# Patient Record
Sex: Male | Born: 1950 | Race: Black or African American | Hispanic: No | Marital: Single | Smoking: Former smoker
Health system: Southern US, Community
[De-identification: ages and names within clinical notes are randomized; demographics above are authoritative.]

## PROBLEM LIST (undated history)

## (undated) DIAGNOSIS — N471 Phimosis: Secondary | ICD-10-CM

## (undated) DIAGNOSIS — R972 Elevated prostate specific antigen [PSA]: Secondary | ICD-10-CM

## (undated) DIAGNOSIS — F79 Unspecified intellectual disabilities: Secondary | ICD-10-CM

## (undated) DIAGNOSIS — I10 Essential (primary) hypertension: Secondary | ICD-10-CM

## (undated) DIAGNOSIS — K219 Gastro-esophageal reflux disease without esophagitis: Secondary | ICD-10-CM

## (undated) DIAGNOSIS — D649 Anemia, unspecified: Secondary | ICD-10-CM

## (undated) DIAGNOSIS — R600 Localized edema: Secondary | ICD-10-CM

## (undated) DIAGNOSIS — F99 Mental disorder, not otherwise specified: Secondary | ICD-10-CM

## (undated) HISTORY — DX: Localized edema: R60.0

## (undated) HISTORY — DX: Mental disorder, not otherwise specified: F99

## (undated) HISTORY — DX: Essential (primary) hypertension: I10

## (undated) HISTORY — DX: Elevated prostate specific antigen (PSA): R97.20

## (undated) HISTORY — DX: Unspecified intellectual disabilities: F79

## (undated) HISTORY — DX: Phimosis: N47.1

---

## 2014-08-20 DIAGNOSIS — R600 Localized edema: Secondary | ICD-10-CM | POA: Insufficient documentation

## 2014-08-20 DIAGNOSIS — F489 Nonpsychotic mental disorder, unspecified: Secondary | ICD-10-CM

## 2014-08-20 DIAGNOSIS — F99 Mental disorder, not otherwise specified: Secondary | ICD-10-CM | POA: Insufficient documentation

## 2014-08-20 DIAGNOSIS — F79 Unspecified intellectual disabilities: Secondary | ICD-10-CM | POA: Insufficient documentation

## 2014-08-20 DIAGNOSIS — I10 Essential (primary) hypertension: Secondary | ICD-10-CM | POA: Insufficient documentation

## 2014-10-15 ENCOUNTER — Ambulatory Visit: Payer: Self-pay | Admitting: Urology

## 2015-06-09 ENCOUNTER — Encounter: Payer: Self-pay | Admitting: Urology

## 2015-06-09 ENCOUNTER — Ambulatory Visit: Payer: Medicare Other | Admitting: Urology

## 2015-06-09 VITALS — BP 182/68 | HR 68 | Ht 71.0 in | Wt 204.0 lb

## 2015-06-09 DIAGNOSIS — R972 Elevated prostate specific antigen [PSA]: Secondary | ICD-10-CM

## 2015-06-10 ENCOUNTER — Encounter: Payer: Self-pay | Admitting: Urology

## 2015-06-10 NOTE — Progress Notes (Signed)
06/09/2015 4:29 PM   Samuel Shelton [birth year] [id number]  Referring provider: No referring provider defined for this encounter.  Chief Complaint  Patient presents with  . Elevated PSA    pt last PSA 05/21/15 7.4ng/mL    HPI: 64 yo M initially presented for evaluation of an elevated PSA to 5.36 ng/dL. He also had a 4K study which indicated 43% risk of high grade malignancy if DRE is positive or 26% if DRE is negative. Unfortunately, Samuel Shelton is mentally handicapped and unable to tolerate a DRE in the office. He was originally scheduled for prostate biopsy in the OR over 6 months ago, however, due to the patient's social situation and change of healthcare proxy, this procedure has been held up until the paperwork has been finalized.  In the interim, his most recent PSA has risen to 7.4 ng/dL on 05/20/15.  He and his health care proxy, Samuel Shelton, counlesed to proceed with DRE/ prostate biopsy in the OR.      PMH: Past Medical History  Diagnosis Date  . Chronic mental illness   . Edema of both legs   . Elevated PSA   . Mental retardation   . Phimosis   . Hypertension   . Elevated PSA     Surgical History: History reviewed. No pertinent past surgical history.  Home Medications:    Medication List       This list is accurate as of: 06/09/15 11:59 PM.  Always use your most recent med list.               hydrochlorothiazide 12.5 MG tablet  Commonly known as:  HYDRODIURIL  Take by mouth.        Allergies: No Known Allergies  Family History: Family History  Problem Relation Age of Onset  . Stroke Mother   . Hypertension Sister     Social History:  reports that he has quit smoking. He does not have any smokeless tobacco history on file. He reports that he does not drink alcohol or use illicit drugs.   Physical Exam: BP 182/68 mmHg  Pulse 68  Ht 5\' 11"  (1.803 m)  Wt 204 lb (92.534 kg)  BMI 28.46 kg/m2  Constitutional:  Alert and oriented, No  acute distress.  HEENT: [place] AT, moist mucus membranes.  Trachea midline, no masses. Cardiovascular: No clubbing, cyanosis, or edema. RRR. Respiratory: Normal respiratory effort, no increased work of breathing.  CTAB. GI: Abdomen is soft, nontender, nondistended, no abdominal masses GU: No CVA tenderness.  Skin: No rashes, bruises or suspicious lesions. Neurologic: Grossly intact, no focal deficits, moving all 4 extremities. Psychiatric: sight somewhat limited, answer some questions appropriately but no others. Somewhat tangential.   Laboratory Data: PSA 7.4 ng/dL 05/20/15  Assessment & Plan:  [name] with chronic mental illness noted to have a rising PSA most recently up to 7.4 ng/dL. He is unable to tolerate a rectal exam in the office. We've previously recommended prostate biopsy in the OR, however, due to social situation with his healthcare power of attorney, this has been delayed. This paperwork has been finalized and we are ready to move forward with prostate biopsy, rectal exam under anesthesia. The risks rocedure were discussed today in detail.  We discussed prostate biopsy in detail including the procedure itself, the risks of blood in the urine, stool, and ejaculate, serious infection, and discomfort. He is willing to proceed with this as discussed.    1. Elevated PSA  2. Rising PSA  level   Return in about 2 weeks (around 06/23/2015) for schedule prostate biopsy in OR.  Hollice Espy, MD  [facility] [address], [place], [place] [address] [phone]

## 2015-06-16 ENCOUNTER — Encounter
Admission: RE | Admit: 2015-06-16 | Discharge: 2015-06-16 | Disposition: A | Discharge status: Home / Self Care | Payer: Medicare Other | Source: Ambulatory Visit | Attending: Urology | Admitting: Urology

## 2015-06-16 DIAGNOSIS — Z01812 Encounter for preprocedural laboratory examination: Secondary | ICD-10-CM | POA: Insufficient documentation

## 2015-06-16 DIAGNOSIS — F489 Nonpsychotic mental disorder, unspecified: Secondary | ICD-10-CM | POA: Insufficient documentation

## 2015-06-16 DIAGNOSIS — F79 Unspecified intellectual disabilities: Secondary | ICD-10-CM | POA: Diagnosis not present

## 2015-06-16 DIAGNOSIS — Z01810 Encounter for preprocedural cardiovascular examination: Secondary | ICD-10-CM | POA: Insufficient documentation

## 2015-06-16 DIAGNOSIS — R972 Elevated prostate specific antigen [PSA]: Secondary | ICD-10-CM | POA: Diagnosis present

## 2015-06-16 DIAGNOSIS — I10 Essential (primary) hypertension: Secondary | ICD-10-CM | POA: Insufficient documentation

## 2015-06-16 DIAGNOSIS — R600 Localized edema: Secondary | ICD-10-CM | POA: Insufficient documentation

## 2015-06-16 LAB — POTASSIUM: Potassium: 4.2 mmol/L (ref 3.5–5.1)

## 2015-06-16 NOTE — Patient Instructions (Signed)
  Your procedure is scheduled on: June 27,2016 (Monday) Report to Day Surgery. To find out your arrival time please call [phone] between 1PM - 3PM on June 19, 2015 (Friday).  Remember: Instructions that are not followed completely may result in serious medical risk, up to and including death, or upon the discretion of your surgeon and anesthesiologist your surgery may need to be rescheduled.    __x__ 1. Do not eat food or drink liquids after midnight. No gum chewing or hard candies.     ____ 2. No Alcohol for 24 hours before or after surgery.   ____ 3. Bring all medications with you on the day of surgery if instructed.    __x__ 4. Notify your doctor if there is any change in your medical condition     (cold, fever, infections).     Do not wear jewelry, make-up, hairpins, clips or nail polish.  Do not wear lotions, powders, or perfumes. You may wear deodorant.  Do not shave 48 hours prior to surgery. Men may shave face and neck.  Do not bring valuables to the hospital.    [facility] is not responsible for any belongings or valuables.               Contacts, dentures or bridgework may not be worn into surgery.  Leave your suitcase in the car. After surgery it may be brought to your room.  For patients admitted to the hospital, discharge time is determined by your                treatment team.   Patients discharged the day of surgery will not be allowed to drive home.   Please read over the following fact sheets that you were given:   Surgical Site Infection Prevention   ____ Take these medicines the morning of surgery with A SIP OF WATER:    1.   2.   3.   4.  5.  6.  ____ Fleet Enema (as directed)   ____ Use CHG Soap as directed  ____ Use inhalers on the day of surgery  ____ Stop metformin 2 days prior to surgery    ____ Take 1/2 of usual insulin dose the night before surgery and none on the morning of surgery.   ____ Stop Coumadin/Plavix/aspirin on   ____  Stop Anti-inflammatories on (No Motrin,Ibuprofen Aleve Advil etc. Tylenol ok to take if needed for pain/ headache)    ____ Stop supplements until after surgery.    ____ Bring C-Pap to the hospital.

## 2015-06-16 NOTE — Pre-Procedure Instructions (Signed)
EKG to anesthesia for review. 

## 2015-06-22 ENCOUNTER — Encounter: Payer: Self-pay | Admitting: *Deleted

## 2015-06-22 ENCOUNTER — Ambulatory Visit: Payer: Medicare Other | Admitting: Anesthesiology

## 2015-06-22 ENCOUNTER — Encounter
Admission: RE | Disposition: A | Discharge status: Home / Self Care | Payer: Self-pay | Source: Ambulatory Visit | Attending: Urology

## 2015-06-22 ENCOUNTER — Ambulatory Visit
Admission: RE | Admit: 2015-06-22 | Discharge: 2015-06-22 | Disposition: A | Discharge status: Home / Self Care | Payer: Medicare Other | Source: Ambulatory Visit | Attending: Urology | Admitting: Urology

## 2015-06-22 DIAGNOSIS — Z8249 Family history of ischemic heart disease and other diseases of the circulatory system: Secondary | ICD-10-CM | POA: Diagnosis not present

## 2015-06-22 DIAGNOSIS — N471 Phimosis: Secondary | ICD-10-CM | POA: Insufficient documentation

## 2015-06-22 DIAGNOSIS — I10 Essential (primary) hypertension: Secondary | ICD-10-CM | POA: Insufficient documentation

## 2015-06-22 DIAGNOSIS — C61 Malignant neoplasm of prostate: Secondary | ICD-10-CM | POA: Diagnosis not present

## 2015-06-22 DIAGNOSIS — Z79899 Other long term (current) drug therapy: Secondary | ICD-10-CM | POA: Insufficient documentation

## 2015-06-22 DIAGNOSIS — F79 Unspecified intellectual disabilities: Secondary | ICD-10-CM | POA: Insufficient documentation

## 2015-06-22 DIAGNOSIS — R600 Localized edema: Secondary | ICD-10-CM | POA: Insufficient documentation

## 2015-06-22 DIAGNOSIS — R972 Elevated prostate specific antigen [PSA]: Secondary | ICD-10-CM | POA: Insufficient documentation

## 2015-06-22 DIAGNOSIS — Z823 Family history of stroke: Secondary | ICD-10-CM | POA: Insufficient documentation

## 2015-06-22 DIAGNOSIS — Z87891 Personal history of nicotine dependence: Secondary | ICD-10-CM | POA: Insufficient documentation

## 2015-06-22 HISTORY — PX: PROSTATE BIOPSY: SHX241

## 2015-06-22 SURGERY — BIOPSY, PROSTATE
Anesthesia: General | Wound class: Clean Contaminated

## 2015-06-22 MED ORDER — LEVOFLOXACIN IN D5W 500 MG/100ML IV SOLN
INTRAVENOUS | Status: AC
  Administered 2015-06-22: 500 mg via INTRAVENOUS
  Filled 2015-06-22: qty 100

## 2015-06-22 MED ORDER — BUPIVACAINE HCL (PF) 0.5 % IJ SOLN
INTRAMUSCULAR | Status: AC
  Filled 2015-06-22: qty 30

## 2015-06-22 MED ORDER — FAMOTIDINE 20 MG PO TABS
ORAL_TABLET | ORAL | Status: AC
  Administered 2015-06-22: 20 mg via ORAL
  Filled 2015-06-22: qty 1

## 2015-06-22 MED ORDER — FAMOTIDINE 20 MG PO TABS
20.0000 mg | ORAL_TABLET | Freq: Once | ORAL | Status: AC
  Administered 2015-06-22: 20 mg via ORAL

## 2015-06-22 MED ORDER — PROPOFOL 10 MG/ML IV BOLUS
INTRAVENOUS | Status: DC | PRN
  Administered 2015-06-22: 30 mg via INTRAVENOUS
  Administered 2015-06-22: 130 mg via INTRAVENOUS

## 2015-06-22 MED ORDER — LIDOCAINE HCL 1 % IJ SOLN
INTRAMUSCULAR | Status: DC | PRN
  Administered 2015-06-22: 10 mL

## 2015-06-22 MED ORDER — FENTANYL CITRATE (PF) 100 MCG/2ML IJ SOLN
INTRAMUSCULAR | Status: DC | PRN
  Administered 2015-06-22: 50 ug via INTRAVENOUS

## 2015-06-22 MED ORDER — LIDOCAINE HCL 2 % EX GEL
CUTANEOUS | Status: AC
  Filled 2015-06-22: qty 10

## 2015-06-22 MED ORDER — EPHEDRINE SULFATE 50 MG/ML IJ SOLN
INTRAMUSCULAR | Status: DC | PRN
  Administered 2015-06-22: 10 mg via INTRAVENOUS

## 2015-06-22 MED ORDER — LEVOFLOXACIN IN D5W 500 MG/100ML IV SOLN
500.0000 mg | INTRAVENOUS | Status: DC
  Administered 2015-06-22: 500 mg via INTRAVENOUS

## 2015-06-22 MED ORDER — LACTATED RINGERS IV SOLN
INTRAVENOUS | Status: DC
  Administered 2015-06-22 (×2): via INTRAVENOUS

## 2015-06-22 MED ORDER — ONDANSETRON HCL 4 MG/2ML IJ SOLN: 4.0000 mg | Freq: Once | INTRAMUSCULAR | Status: DC | PRN

## 2015-06-22 MED ORDER — LIDOCAINE HCL 2 % EX GEL
CUTANEOUS | Status: DC | PRN
  Administered 2015-06-22: 1

## 2015-06-22 MED ORDER — MIDAZOLAM HCL 2 MG/2ML IJ SOLN
INTRAMUSCULAR | Status: DC | PRN
  Administered 2015-06-22: 1 mg via INTRAVENOUS

## 2015-06-22 MED ORDER — LIDOCAINE HCL (PF) 1 % IJ SOLN
INTRAMUSCULAR | Status: AC
  Filled 2015-06-22: qty 30

## 2015-06-22 MED ORDER — GENTAMICIN IN SALINE 1.6-0.9 MG/ML-% IV SOLN
80.0000 mg | Freq: Once | INTRAVENOUS | Status: AC
  Administered 2015-06-22: 80 mg via INTRAVENOUS
  Filled 2015-06-22: qty 50

## 2015-06-22 MED ORDER — FENTANYL CITRATE (PF) 100 MCG/2ML IJ SOLN: 25.0000 ug | INTRAMUSCULAR | Status: DC | PRN

## 2015-06-22 SURGICAL SUPPLY — 20 items
BAG URO DRAIN 2000ML W/SPOUT (MISCELLANEOUS) ×2 IMPLANT
BLADE CLIPPER SURG (BLADE) IMPLANT
DRAPE SHEET LG 3/4 BI-LAMINATE (DRAPES) ×2 IMPLANT
DRESSING TELFA 4X3 1S ST N-ADH (GAUZE/BANDAGES/DRESSINGS) ×2 IMPLANT
DRSG TELFA 3X8 NADH (GAUZE/BANDAGES/DRESSINGS) ×2 IMPLANT
GAUZE SPONGE 4X4 12PLY STRL (GAUZE/BANDAGES/DRESSINGS) ×2 IMPLANT
GLOVE BIO SURGEON STRL SZ 6.5 (GLOVE) ×2 IMPLANT
GLOVE BIO SURGEON STRL SZ7 (GLOVE) ×4 IMPLANT
GUIDE NEEDLE ENDOCAV 16-18 CVR (NEEDLE) ×2 IMPLANT
INST BIOPSY MAXCORE 18GX25 (NEEDLE) ×2 IMPLANT
KIT RM TURNOVER CYSTO AR (KITS) ×2 IMPLANT
NDL DEFLUX 3.7X23X350 (MISCELLANEOUS) ×2
NDL SAFETY 18GX1.5 (NEEDLE) IMPLANT
NDL SAFETY 22GX1.5 (NEEDLE) IMPLANT
NEEDLE DEFLUX 3.7X23X350 (MISCELLANEOUS) ×1 IMPLANT
NEEDLE GUIDE BIOPSY 644068 (NEEDLE) ×2 IMPLANT
PREP PVP WINGED SPONGE (MISCELLANEOUS) IMPLANT
SYRINGE 10CC LL (SYRINGE) ×2 IMPLANT
SYRINGE IRR TOOMEY STRL 70CC (SYRINGE) ×2 IMPLANT
WATER STERILE IRR 1000ML POUR (IV SOLUTION) ×2 IMPLANT

## 2015-06-22 NOTE — Anesthesia Preprocedure Evaluation (Addendum)
Anesthesia Evaluation  Patient identified by MRN, date of birth, ID band Patient awake    Reviewed: Allergy & Precautions, NPO status , Patient's Chart, lab work & pertinent test results, reviewed documented beta blocker date and time   Airway Mallampati: II  TM Distance: >3 FB     Dental  (+) Edentulous Upper, Edentulous Lower   Pulmonary former smoker,          Cardiovascular hypertension,     Neuro/Psych PSYCHIATRIC DISORDERS    GI/Hepatic   Endo/Other    Renal/GU      Musculoskeletal   Abdominal   Peds  Hematology   Anesthesia Other Findings Mentation decreased.  Reproductive/Obstetrics                            Anesthesia Physical Anesthesia Plan  ASA: II  Anesthesia Plan: General   Post-op Pain Management:    Induction: Intravenous  Airway Management Planned: LMA  Additional Equipment:   Intra-op Plan:   Post-operative Plan:   Informed Consent: I have reviewed the patients History and Physical, chart, labs and discussed the procedure including the risks, benefits and alternatives for the proposed anesthesia with the patient or authorized representative who has indicated his/her understanding and acceptance.     Plan Discussed with: CRNA  Anesthesia Plan Comments:        Anesthesia Quick Evaluation

## 2015-06-22 NOTE — H&P (Signed)
Status: Signed       Expand All Collapse All     06/09/2015 4:29 PM   Samuel Shelton 03/27/1951 [id number]  Referring provider: No referring provider defined for this encounter.  Chief Complaint  Patient presents with  . Elevated PSA    pt last PSA 05/21/15 7.4ng/mL    HPI: 64 yo M initially presented for evaluation of an elevated PSA to 5.36 ng/dL. He also had a 4K study which indicated 43% risk of high grade malignancy if DRE is positive or 26% if DRE is negative. Unfortunately, Mr. Folts is mentally handicapped and unable to tolerate a DRE in the office. He was originally scheduled for prostate biopsy in the OR over 6 months ago, however, due to the patient's social situation and change of healthcare proxy, this procedure has been held up until the paperwork has been finalized.  In the interim, his most recent PSA has risen to 7.4 ng/dL on 05/20/15.  He and his health care proxy, [name], counlesed to proceed with DRE/ prostate biopsy in the OR.    PMH: Past Medical History  Diagnosis Date  . Chronic mental illness   . Edema of both legs   . Elevated PSA   . Mental retardation   . Phimosis   . Hypertension   . Elevated PSA     Surgical History: History reviewed. No pertinent past surgical history.  Home Medications:    Medication List       This list is accurate as of: 06/09/15 11:59 PM. Always use your most recent med list.              hydrochlorothiazide 12.5 MG tablet  Commonly known as: HYDRODIURIL  Take by mouth.        Allergies: No Known Allergies  Family History: Family History  Problem Relation Age of Onset  . Stroke Mother   . Hypertension Sister     Social History:  reports that he has quit smoking. He does not have any smokeless tobacco history on file. He reports that he does not drink alcohol or use illicit drugs.   Physical Exam: BP 182/68 mmHg   Pulse 68  Ht 5\' 11"  (1.803 m)  Wt 204 lb (92.534 kg)  BMI 28.46 kg/m2  Constitutional: Alert and oriented, No acute distress.  HEENT: [place] AT, moist mucus membranes. Trachea midline, no masses. Cardiovascular: No clubbing, cyanosis, or edema. RRR. Respiratory: Normal respiratory effort, no increased work of breathing. CTAB. GI: Abdomen is soft, nontender, nondistended, no abdominal masses GU: No CVA tenderness.  Skin: No rashes, bruises or suspicious lesions. Neurologic: Grossly intact, no focal deficits, moving all 4 extremities. Psychiatric: sight somewhat limited, answer some questions appropriately but no others. Somewhat tangential.   Laboratory Data: PSA 7.4 ng/dL 05/20/15  Assessment & Plan: [name] with chronic mental illness noted to have a rising PSA most recently up to 7.4 ng/dL. He is unable to tolerate a rectal exam in the office. We've previously recommended prostate biopsy in the OR, however, due to social situation with his healthcare power of attorney, this has been delayed. This paperwork has been finalized and we are ready to move forward with prostate biopsy, rectal exam under anesthesia. The risks rocedure were discussed today in detail. We discussed prostate biopsy in detail including the procedure itself, the risks of blood in the urine, stool, and ejaculate, serious infection, and discomfort. He is willing to proceed with this as discussed.   1. Elevated PSA  2. Rising PSA level   Return in about 2 weeks (around 06/23/2015) for schedule prostate biopsy in OR.  Hollice Espy, MD  [facility] [address], [place], [place] [address] [phone]

## 2015-06-22 NOTE — Op Note (Signed)
Prostate Biopsy Procedure   Procedure: Prostate biopsy with transrectal ultrasound guidance, local prostate block Surgeon: Dr. Erlene Quan EBL: minimal Specimens: prostate biopsy x 13 Drains: none Complications: none  Informed consent was obtained by his health care POA after discussing risks/benefits of the procedure.  A time out was performed to ensure correct patient identity.  He was then placed under anesthesia and repositioned in the lateral decubitus position with knees to chest.    Pre-Procedure: - Last PSA Level:  -Rectal exam performed with revealed normal prostate, approximately 40+ cc, no nodules.  Normal sphincter tone.   - Gentamicin given prophylactically IV - Levaquin 500 mg administered IV -Transrectal Ultrasound performed revealing normal appearing prostate with midline cyst near base of SV, hypoechoic, small. -No significant hypoechoic or median lobe noted  Procedure: - Prostate block performed using 10 cc 1% lidocaine and biopsies taken from sextant areas, a total of 12 under ultrasound guidance.  Additional "bonus" biopsy performed in the right mid prostate.    Post-Procedure: - Patient tolerated the procedure well -He was reversed from anesthesia and taken to the PACU in stable condition  Plan: - He was counseled to seek immediate medical attention if experiences any severe pain, significant bleeding, or fevers - Return in one week to discuss biopsy results

## 2015-06-22 NOTE — Transfer of Care (Signed)
Immediate Anesthesia Transfer of Care Note  Patient: Samuel Shelton  Procedure(s) Performed: Procedure(s): PROSTATE BIOPSY (N/A)  Patient Location: PACU  Anesthesia Type:General  Level of Consciousness: sedated  Airway & Oxygen Therapy: Patient Spontanous Breathing and Patient connected to face mask oxygen  Post-op Assessment: Report given to RN  Post vital signs: Reviewed and stable  Last Vitals:  Filed Vitals:   06/22/15 1044  BP: 177/94  Pulse: 71  Temp: 37.2 C  Resp: 16    Complications: No apparent anesthesia complications

## 2015-06-22 NOTE — Interval H&P Note (Signed)
History and Physical Interval Note:  06/22/2015 2:08 PM  Samuel Shelton  has presented today for surgery, with the diagnosis of ELEVATED PSA  The various methods of treatment have been discussed with the patient and family. After consideration of risks, benefits and other options for treatment, the patient has consented to  Procedure(s): PROSTATE BIOPSY (N/A) as a surgical intervention .  The patient's history has been reviewed, patient examined, no change in status, stable for surgery.  I have reviewed the patient's chart and labs.  Questions were answered to the patient's satisfaction.    RRR CTAB  Hollice Espy

## 2015-06-22 NOTE — Anesthesia Procedure Notes (Signed)
Procedure Name: LMA Insertion Date/Time: 06/22/2015 2:37 PM Performed by: Allean Found Pre-anesthesia Checklist: Patient identified, Suction available, Patient being monitored and Emergency Drugs available Patient Re-evaluated:Patient Re-evaluated prior to inductionOxygen Delivery Method: Circle system utilized Preoxygenation: Pre-oxygenation with 100% oxygen Intubation Type: IV induction Ventilation: Mask ventilation without difficulty LMA: LMA inserted LMA Size: 4.5 Number of attempts: 1 Tube secured with: Tape

## 2015-06-22 NOTE — Discharge Instructions (Signed)

## 2015-06-23 ENCOUNTER — Encounter: Payer: Self-pay | Admitting: Urology

## 2015-06-23 NOTE — Anesthesia Postprocedure Evaluation (Signed)
  Anesthesia Post-op Note  Patient: Samuel Shelton  Procedure(s) Performed: Procedure(s): PROSTATE BIOPSY (N/A)  Anesthesia type:General  Patient location: PACU  Post pain: Pain level controlled  Post assessment: Post-op Vital signs reviewed, Patient's Cardiovascular Status Stable, Respiratory Function Stable, Patent Airway and No signs of Nausea or vomiting  Post vital signs: Reviewed and stable  Last Vitals:  Filed Vitals:   06/22/15 1710  BP: 181/88  Pulse: 65  Temp:   Resp: 16    Level of consciousness: awake, alert  and patient cooperative  Complications: No apparent anesthesia complications

## 2015-06-26 ENCOUNTER — Telehealth: Payer: Self-pay

## 2015-06-26 LAB — SURGICAL PATHOLOGY

## 2015-06-26 NOTE — Telephone Encounter (Signed)
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 Cw,lpn

## 2015-07-03 ENCOUNTER — Ambulatory Visit: Payer: Self-pay | Admitting: Urology

## 2015-07-21 ENCOUNTER — Ambulatory Visit: Payer: Medicare Other | Admitting: Urology

## 2015-07-28 ENCOUNTER — Ambulatory Visit: Payer: Medicare Other | Admitting: Urology

## 2015-07-28 ENCOUNTER — Ambulatory Visit (INDEPENDENT_AMBULATORY_CARE_PROVIDER_SITE_OTHER): Payer: Medicare Other | Admitting: Urology

## 2015-07-28 ENCOUNTER — Encounter: Payer: Self-pay | Admitting: Urology

## 2015-07-28 VITALS — BP 176/98 | HR 102 | Ht 72.0 in | Wt 204.6 lb

## 2015-07-28 DIAGNOSIS — C61 Malignant neoplasm of prostate: Secondary | ICD-10-CM | POA: Diagnosis not present

## 2015-07-29 LAB — PSA: Prostate Specific Ag, Serum: 7.6 ng/mL — ABNORMAL HIGH (ref 0.0–4.0)

## 2015-07-30 NOTE — Progress Notes (Signed)
8:09 AM  07/28/15   Samuel Shelton 1951/08/22 [id number]  Referring provider: No referring provider defined for this encounter.  Chief Complaint  Patient presents with  . Results    HPI: 64 yo M initially presented for evaluation of an elevated PSA to 5.36 ng/dL in Fall 2015 . He also had a 4K study which indicated 43% risk of high grade malignancy if DRE is positive or 26% if DRE is negative. Unfortunately, Samuel Shelton is mentally handicapped and unable to tolerate a DRE in the office. He was originally scheduled for prostate biopsy in the OR over 6 months ago, however, due to the patient's social situation and change of healthcare proxy, this procedure has been held up until the paperwork has been finalized. In the interim, his most recent PSA had risen to 7.4 ng/dL on 05/20/15.  He and his health care proxy, Samuel Shelton, counlesed to proceed with DRE/ prostate biopsy in the OR on 06/22/15.      They return today in the office to discuss biopsy results.  DRE in the OR showed no evidence of nodules, enlarged gland.  Pathology consistent wit Gleason 3+3 involving 3/13 cores up to 30% on the right.     PMH: Past Medical History  Diagnosis Date  . Chronic mental illness   . Edema of both legs   . Elevated PSA   . Mental retardation   . Phimosis   . Hypertension   . Elevated PSA     Surgical History: Past Surgical History  Procedure Laterality Date  . Prostate biopsy N/A 06/22/2015    Procedure: PROSTATE BIOPSY;  Surgeon: Hollice Espy, MD;  Location: [facility] ORS;  Service: Urology;  Laterality: N/A;    Home Medications:    Medication List       This list is accurate as of: 07/28/15 11:59 PM.  Always use your most recent med list.               hydrochlorothiazide 12.5 MG tablet  Commonly known as:  HYDRODIURIL  Take 12.5 mg by mouth.        Allergies: No Known Allergies  Family History: Family History  Problem Relation Age of Onset  . Stroke Mother   .  Hypertension Sister   . Diabetes type II Sister     Social History:  reports that he has quit smoking. His smoking use included Cigarettes. He smoked 0.50 packs per day. He has never used smokeless tobacco. He reports that he does not drink alcohol or use illicit drugs.   Physical Exam: BP 176/98 mmHg  Pulse 102  Ht 6' (1.829 m)  Wt 204 lb 9.6 oz (92.806 kg)  BMI 27.74 kg/m2  Constitutional:  Alert and oriented, No acute distress.  HEENT: [place] AT, moist mucus membranes.  Trachea midline, no masses. Cardiovascular: No clubbing, cyanosis, or edema. RRR. Respiratory: Normal respiratory effort, no increased work of breathing.  CTAB. GI: Abdomen is soft, nontender, nondistended, no abdominal masses GU: No CVA tenderness.  Skin: No rashes, bruises or suspicious lesions. Neurologic: Grossly intact, no focal deficits, moving all 4 extremities. Psychiatric: sight somewhat limited, answer some questions appropriately but no others. Somewhat tangential.   Laboratory Data: PSA 7.4 ng/dL 05/20/15  Assessment & Plan:  [name] with chronic mental illness with newly dx T1c Gleason 3+3 prostate cancer involving 3/12 cores, low volume.   PSA at the time of dx 7.4.    The patient/ health care proxy were counseled  about the natural history of prostate cancer and the standard treatment options that are available for prostate cancer. It was explained to him how his age and life expectancy, clinical stage, Gleason score, and PSA affect his prognosis, the decision to proceed with additional staging studies, as well as how that information influences recommended treatment strategies. We discussed the roles for active surveillance, radiation therapy, surgical therapy, androgen deprivation, as well as ablative therapy options for the treatment of prostate cancer as appropriate to his individual cancer situation. We discussed the risks and benefits of these options with regard to their impact  on cancer controland also in terms of potential adverse events, complications, and impact on quality of life particularly related to urinary, bowel, and sexual function. The patient/ healthcare proxy were encouraged to ask questions throughout the discussion today and all questions were answered to his stated satisfaction. In addition, the patient was provided with and/or directed to appropriate resources and literature for further education about prostate cancer treatment options.  We discussed surgical therapy for prostate cancer including the different available surgical approaches. We discussed, in detail, the risks and expectations of surgery with regard to cancer control, urinary control, and erectile dysfunction as well as expected post operative cover he processed. Additional risks of surgery including but not limited to bleeding, infection, hernia formation, nerve damage, steel formation, bowel/rectal injury, potentially necessitating colostomy, damage to the urinary tract resulting in urinary leakage, urethral stricture, and cardiopulmonary risk such as myocardial infarction, stroke, death, thromboembolism etc. were explained. The risk of open surgical conversion for robotics/laparoscopic prostatectomy is also discussed.  Considering the patient's low risk prostate cancer and other comorbidities including lack ability to participate in decision making and daily self care, strongly recommend either active surveillance versus brachii therapy.  Patient's healthcare proxy would be unable to accommodate daily radiation treatments.  Decision was made today to proceed with active surveillance. Discussed the active surveillance protocol including serial PSAs and rectal exams if tolerated in the office.  I would also strongly recommend repeat biopsy 1 year as a confirmatory biopsy as per most active surveillance protocols.   Return in about 4 months (around 11/27/2015) for PSA/ DRE.  Hollice Espy,  MD  [facility] [address], [place], [place] [address] [phone]  I spent 30 min with this patient of which greater than 50% was spent in counseling and coordination of care with the patient.

## 2015-08-03 ENCOUNTER — Telehealth: Payer: Self-pay

## 2015-08-03 NOTE — Telephone Encounter (Signed)
Spoke with pt niece, healthcare proxy, and made aware of PSA and importance of keep f/u appt. Niece voiced understanding.

## 2015-08-03 NOTE — Telephone Encounter (Signed)
-----   Message from Hollice Espy, MD sent at 08/02/2015  1:48 PM EDT ----- PSA stable.  Please inform healthcare proxy and stress importance of follow up.  Hollice Espy, MD

## 2015-12-08 ENCOUNTER — Ambulatory Visit: Payer: Medicare Other | Admitting: Urology

## 2015-12-22 ENCOUNTER — Ambulatory Visit: Payer: Medicare Other | Admitting: Urology

## 2015-12-24 ENCOUNTER — Encounter: Payer: Self-pay | Admitting: Urology

## 2015-12-24 ENCOUNTER — Ambulatory Visit: Payer: Medicare Other | Admitting: Urology

## 2016-01-12 ENCOUNTER — Encounter: Payer: Self-pay | Admitting: Urology

## 2016-01-12 ENCOUNTER — Ambulatory Visit (INDEPENDENT_AMBULATORY_CARE_PROVIDER_SITE_OTHER): Payer: Medicare Other | Admitting: Urology

## 2016-01-12 VITALS — BP 212/118 | HR 71 | Ht 74.0 in | Wt 209.3 lb

## 2016-01-12 DIAGNOSIS — C61 Malignant neoplasm of prostate: Secondary | ICD-10-CM

## 2016-01-12 LAB — URINALYSIS, COMPLETE
Bilirubin, UA: NEGATIVE
Glucose, UA: NEGATIVE
Ketones, UA: NEGATIVE
Leukocytes, UA: NEGATIVE
Nitrite, UA: NEGATIVE
Protein, UA: NEGATIVE
RBC, UA: NEGATIVE
Specific Gravity, UA: 1.025 (ref 1.005–1.030)
Urobilinogen, Ur: 1 mg/dL (ref 0.2–1.0)
pH, UA: 6 (ref 5.0–7.5)

## 2016-01-12 LAB — MICROSCOPIC EXAMINATION
Epithelial Cells (non renal): NONE SEEN /hpf (ref 0–10)
WBC, UA: NONE SEEN /hpf (ref 0–?)

## 2016-01-12 NOTE — Progress Notes (Signed)
01/12/2016 9:54 AM   Samuel Shelton [birth year] [id number]  Referring provider: Sofie Hartigan, MD Samuel Shelton, [place] [address]  Chief Complaint  Patient presents with  . Prostate Cancer    64month with PSA    HPI: Samuel Shelton is a 65yo with a hx of prostate cancer on active surveillance here for followup. Pathology is Gleason 3+3 involving 3/13 cores up to 30% on the right. HIs PSA at the time of diagnosis was 7.4 PSA pending today. Of note he is mentally handicapped and refused a DRE today.  He has frequency q1-2 hours. Nocturia 3-4x. No urge incontinence. NO hematuria, no dysuria. He denies incomplete emptying.    PMH: Past Medical History  Diagnosis Date  . Chronic mental illness   . Edema of both legs   . Elevated PSA   . Mental retardation   . Phimosis   . Hypertension   . Elevated PSA     Surgical History: Past Surgical History  Procedure Laterality Date  . Prostate biopsy N/A 06/22/2015    Procedure: PROSTATE BIOPSY;  Surgeon: Samuel Espy, MD;  Location: [facility] ORS;  Service: Urology;  Laterality: N/A;    Home Medications:    Medication List       This list is accurate as of: 01/12/16  9:54 AM.  Always use your most recent med list.               hydrochlorothiazide 12.5 MG tablet  Commonly known as:  HYDRODIURIL  Take 12.5 mg by mouth.        Allergies: No Known Allergies  Family History: Family History  Problem Relation Age of Onset  . Stroke Mother   . Hypertension Sister   . Diabetes type II Sister     Social History:  reports that he has quit smoking. His smoking use included Cigarettes. He smoked 0.50 packs per day. He has never used smokeless tobacco. He reports that he does not drink alcohol or use illicit drugs.  ROS: UROLOGY Frequent Urination?: Yes Hard to postpone urination?: No Burning/pain with urination?: No Get up at night to urinate?: No Leakage of urine?: Yes Urine  stream starts and stops?: No Trouble starting stream?: No Do you have to strain to urinate?: No Blood in urine?: No Urinary tract infection?: No Sexually transmitted disease?: No Injury to kidneys or bladder?: No Painful intercourse?: No Weak stream?: No Erection problems?: No Penile pain?: No  Gastrointestinal Nausea?: No Vomiting?: No Indigestion/heartburn?: No Diarrhea?: No Constipation?: No  Constitutional Fever: No Night sweats?: No Weight loss?: No Fatigue?: No  Skin Skin rash/lesions?: No Itching?: No  Eyes Blurred vision?: No Double vision?: No  Ears/Nose/Throat Sore throat?: No Sinus problems?: No  Hematologic/Lymphatic Swollen glands?: No Easy bruising?: No  Cardiovascular Leg swelling?: No Chest pain?: No  Respiratory Cough?: No Shortness of breath?: No  Endocrine Excessive thirst?: No  Musculoskeletal Back pain?: No Joint pain?: No  Neurological Headaches?: No Dizziness?: No  Psychologic Depression?: No Anxiety?: No  Physical Exam: BP 212/118 mmHg  Pulse 71  Ht 6\' 2"  (1.88 m)  Wt 94.938 kg (209 lb 4.8 oz)  BMI 26.86 kg/m2  Constitutional:  Alert and oriented, No acute distress. HEENT: [place] AT, moist mucus membranes.  Trachea midline, no masses. Cardiovascular: No clubbing, cyanosis, or edema. Respiratory: Normal respiratory effort, no increased work of breathing. GI: Abdomen is soft, nontender, nondistended, no abdominal masses GU: No CVA tenderness.  Skin:  No rashes, bruises or suspicious lesions. Lymph: No cervical or inguinal adenopathy. Neurologic: Grossly intact, no focal deficits, moving all 4 extremities. Psychiatric: Normal mood and affect.  Laboratory Data: No results found for: WBC, HGB, HCT, MCV, PLT  No results found for: CREATININE  No results found for: PSA  No results found for: TESTOSTERONE  No results found for: HGBA1C  Urinalysis No results found for: COLORURINE, APPEARANCEUR, LABSPEC, PHURINE,  GLUCOSEU, HGBUR, BILIRUBINUR, KETONESUR, PROTEINUR, UROBILINOGEN, NITRITE, LEUKOCYTESUR  Pertinent Imaging: none  Assessment & Plan:    1. Prostate cancer ([place]) RTC 3 months with PSA - PSA, will call with results - Urinalysis, Complete  2. BPH with LUTS - pt defers alpha blocker therapy   No Follow-up on file.  Samuel Shelton, [facility] [address] [place], [place] [address] [phone]

## 2016-01-13 LAB — PSA: Prostate Specific Ag, Serum: 8.9 ng/mL — ABNORMAL HIGH (ref 0.0–4.0)

## 2016-04-12 ENCOUNTER — Encounter: Payer: Self-pay | Admitting: Urology

## 2016-04-12 ENCOUNTER — Ambulatory Visit: Payer: Medicare Other | Admitting: Urology

## 2016-09-07 ENCOUNTER — Telehealth: Payer: Self-pay | Admitting: Urology

## 2016-09-07 ENCOUNTER — Ambulatory Visit: Payer: Medicare Other | Admitting: Urology

## 2016-09-07 NOTE — Telephone Encounter (Signed)
I reached out to her and she said she forgot about the appt today and promised to bring him tomorrow. If she does not we will send one out tomorrow.   michelle

## 2016-09-07 NOTE — Telephone Encounter (Signed)
Patient was a no show for surveillance of his prostate cancer.  At this point, we have had difficulty reaching him. I think that is reasonable to send him/ his caretaker a certified letter stressing the importance of follow-up.  Hollice Espy, MD

## 2016-09-08 ENCOUNTER — Ambulatory Visit (INDEPENDENT_AMBULATORY_CARE_PROVIDER_SITE_OTHER): Payer: Medicare Other | Admitting: Urology

## 2016-09-08 VITALS — BP 164/127 | HR 103 | Ht 72.0 in | Wt 207.0 lb

## 2016-09-08 DIAGNOSIS — N3941 Urge incontinence: Secondary | ICD-10-CM

## 2016-09-08 DIAGNOSIS — C61 Malignant neoplasm of prostate: Secondary | ICD-10-CM

## 2016-09-08 DIAGNOSIS — I10 Essential (primary) hypertension: Secondary | ICD-10-CM | POA: Diagnosis not present

## 2016-09-08 LAB — URINALYSIS, COMPLETE
Bilirubin, UA: NEGATIVE
Glucose, UA: NEGATIVE
Leukocytes, UA: NEGATIVE
Nitrite, UA: NEGATIVE
Protein, UA: NEGATIVE
RBC, UA: NEGATIVE
Specific Gravity, UA: 1.02 (ref 1.005–1.030)
Urobilinogen, Ur: 1 mg/dL (ref 0.2–1.0)
pH, UA: 6 (ref 5.0–7.5)

## 2016-09-08 LAB — MICROSCOPIC EXAMINATION

## 2016-09-08 LAB — BLADDER SCAN AMB NON-IMAGING

## 2016-09-09 LAB — PSA: Prostate Specific Ag, Serum: 8.5 ng/mL — ABNORMAL HIGH (ref 0.0–4.0)

## 2016-09-12 ENCOUNTER — Telehealth: Payer: Self-pay | Admitting: Urology

## 2016-09-12 NOTE — Progress Notes (Signed)
8:54 AM  09/08/16  Samuel Shelton Nov 03, 1951 [id number]  Referring provider: Sofie Hartigan, MD Samuel Shelton, [place] [address]  Chief Complaint  Patient presents with  . Prostate Cancer    follow up    HPI: 65 yo M initially presented for evaluation of an elevated PSA to 5.36 ng/dL in Fall 2015 . He also had a 4K study which indicated 43% risk of high grade malignancy if DRE is positive or 26% if DRE is negative. Unfortunately, Samuel Shelton is mentally handicapped and unable to tolerate a DRE/ biopsy in the office.  In the interim, his PSA rose to rose to 7.4 ng/dL on 05/20/15.  He was taken to the OR on 05/2015 at which time DRE in the OR showed no evidence of nodules, enlarged gland.  Pathology consistent wit Gleason 3+3 involving 3/13 cores up to 30% on the right.    He and his healthcare proxy ultimately elected to proceed with active surveillance. PSA on 1/17 2017 rose to 8.9. Repeat PSA today relatively stable at 8.5.  He is accompanied by his health care proxy, Samuel Shelton today.    She mentions today that he's had some issues with urinary incontinence and foul-smelling urine. She is unsure if he may have something physiologic or if this is behavioral.   PMH: Past Medical History:  Diagnosis Date  . Chronic mental illness   . Edema of both legs   . Elevated PSA   . Elevated PSA   . Hypertension   . Mental retardation   . Phimosis     Surgical History: Past Surgical History:  Procedure Laterality Date  . PROSTATE BIOPSY N/A 06/22/2015   Procedure: PROSTATE BIOPSY;  Surgeon: Hollice Espy, MD;  Location: [facility] ORS;  Service: Urology;  Laterality: N/A;    Home Medications:    Medication List       Accurate as of 09/08/16 11:59 PM. Always use your most recent med list.          hydrochlorothiazide 12.5 MG tablet Commonly known as:  HYDRODIURIL Take 12.5 mg by mouth.       Allergies: No Known  Allergies  Family History: Family History  Problem Relation Age of Onset  . Stroke Mother   . Hypertension Sister   . Diabetes type II Sister     Social History:  reports that he has quit smoking. His smoking use included Cigarettes. He smoked 0.50 packs per day. He has never used smokeless tobacco. He reports that he does not drink alcohol or use drugs.   Physical Exam: BP (!) 164/127   Pulse (!) 103   Ht 6' (1.829 m)   Wt 207 lb (93.9 kg)   BMI 28.07 kg/m   Constitutional:  Alert and oriented, No acute distress.  HEENT: [place] AT, moist mucus membranes.  Trachea midline, no masses. Cardiovascular: No clubbing, cyanosis, or edema. RRR. Respiratory: Normal respiratory effort, no increased work of breathing.  CTAB. GI: Abdomen is soft, nontender, nondistended, no abdominal masses GU: No CVA tenderness.  Skin: No rashes, bruises or suspicious lesions. Neurologic: Grossly intact, no focal deficits, moving all 4 extremities. Psychiatric: sight somewhat limited, answer some questions appropriately but no others. Somewhat tangential.   Laboratory Data: Component     Latest Ref Rng & Units 07/28/2015 01/12/2016 09/08/2016  PSA     0.0 - 4.0 ng/mL 7.6 (H) 8.9 (H) 8.5 (H)   Urinalysis Results for orders  placed or performed in visit on 09/08/16  Microscopic Examination  Result Value Ref Range   WBC, UA 0-5 0 - 5 /hpf   RBC, UA 0-2 0 - 2 /hpf   Epithelial Cells (non renal) 0-10 0 - 10 /hpf   Mucus, UA Present (A) Not Estab.   Bacteria, UA Few None seen/Few  PSA  Result Value Ref Range   Prostate Specific Ag, Serum 8.5 (H) 0.0 - 4.0 ng/mL  Urinalysis, Complete  Result Value Ref Range   Specific Gravity, UA 1.020 1.005 - 1.030   pH, UA 6.0 5.0 - 7.5   Color, UA Yellow Yellow   Appearance Ur Clear Clear   Leukocytes, UA Negative Negative   Protein, UA Negative Negative/Trace   Glucose, UA Negative Negative   Ketones, UA Trace (A) Negative   RBC, UA Negative Negative   Bilirubin,  UA Negative Negative   Urobilinogen, Ur 1.0 0.2 - 1.0 mg/dL   Nitrite, UA Negative Negative   Microscopic Examination See below:   BLADDER SCAN AMB NON-IMAGING  Result Value Ref Range   Scan Result 29ml     Assessment & Plan:  65 year old African-American male with chronic mental illness with newly dx T1c Gleason 3+3 prostate cancer involving 3/12 cores, low volume.   PSA at the time of dx 7.4.    1. Prostate cancer ([place]) PSA relatively stable.  We had a lengthy discussion today regarding active surveillance protocol. As per most active surveillance protocols, recommend a confirmatory repeat biopsy at 1 year or prostate MRI. Given his mental health issues, I do not feel that he be able to lay still in order to tolerate an MRI. As such, I have recommended considering proceeding to the operating room for a confirmatory biopsy and rectal exam under anesthesia. The patient and his health care proxy or agreeable with this plan.  - PSA - Urinalysis, Complete - BLADDER SCAN AMB NON-IMAGING  2. Urge incontinence of urine Increased episodes of incontinence.  UA today unremarkable for urinary tract infection. No evidence of overflow incontinence given low postvoid residual.   Suspect this is mostly behavioral.  3. Essential hypertension Patient severely hypertensive today although asymptomatic. I urged him to follow up with his PCP. Patient's healthcare proxy believes he did not take his blood pressure medicine this morning.   Schedule prostate biopsy in the operating room.  Hollice Espy, MD  [facility] [address] [place], [place] [address] [phone]

## 2016-09-12 NOTE — Telephone Encounter (Signed)
Reviewed PSA with patient's healthcare proxy today, [name].  I reiterated my recommendation for repeat biopsy under anesthesia, possibly monitored anesthesia care. She and Jaskaran are agreeable with this plan. We'll arrange for biopsy in the operating room.  He will need to do standard prostate biopsy prep.  Hollice Espy, MD

## 2016-09-15 NOTE — Telephone Encounter (Addendum)
Notified patient's POA, [name], of prostate biopsy scheduled on 09/27/2016 with Dr. Erlene Quan, preadmission testing appointment on 09/19/2016 at 1:45 PM and to call day before surgery for arrival time to same day surgery. Ms. Laurance Flatten voices understanding. Also left information on vm per request.

## 2016-09-16 ENCOUNTER — Other Ambulatory Visit: Payer: Self-pay | Admitting: Radiology

## 2016-09-16 DIAGNOSIS — C61 Malignant neoplasm of prostate: Secondary | ICD-10-CM

## 2016-09-16 NOTE — Telephone Encounter (Signed)
Notified pt's POA that pt should drink 1/2 bottle of magnesium citrate at noon the day prior to surgery per Dr Erlene Quan d/t pt refuses to use Fleets enema. [name] voices understanding.

## 2016-09-16 NOTE — Telephone Encounter (Signed)
LMOM. Need to notify pt's POA that pt should drink 1/2 bottle of magnesium citrate at noon the day prior to surgery per Dr Erlene Quan d/t pt refuses to use Fleets enema.

## 2016-09-19 ENCOUNTER — Other Ambulatory Visit: Payer: Medicare Other

## 2016-09-21 ENCOUNTER — Encounter
Admission: RE | Admit: 2016-09-21 | Discharge: 2016-09-21 | Disposition: A | Discharge status: Home / Self Care | Payer: Medicare Other | Source: Ambulatory Visit | Attending: Urology | Admitting: Urology

## 2016-09-21 DIAGNOSIS — Z01812 Encounter for preprocedural laboratory examination: Secondary | ICD-10-CM | POA: Diagnosis present

## 2016-09-21 DIAGNOSIS — Z01810 Encounter for preprocedural cardiovascular examination: Secondary | ICD-10-CM | POA: Diagnosis present

## 2016-09-21 DIAGNOSIS — I10 Essential (primary) hypertension: Secondary | ICD-10-CM | POA: Diagnosis not present

## 2016-09-21 LAB — BASIC METABOLIC PANEL
Anion gap: 7 (ref 5–15)
BUN: 14 mg/dL (ref 6–20)
CO2: 29 mmol/L (ref 22–32)
Calcium: 9.5 mg/dL (ref 8.9–10.3)
Chloride: 106 mmol/L (ref 101–111)
Creatinine, Ser: 1.07 mg/dL (ref 0.61–1.24)
GFR calc Af Amer: >60 mL/min (ref >60)
GFR calc non Af Amer: >60 mL/min (ref >60)
Glucose, Bld: 96 mg/dL (ref 65–99)
Potassium: 4.2 mmol/L (ref 3.5–5.1)
Sodium: 142 mmol/L (ref 135–145)

## 2016-09-21 NOTE — Pre-Procedure Instructions (Signed)
Your procedure is scheduled on: September 27, 2016 Su procedimiento est programado para: Report to [place] a: To find out your arrival time please call [phone] between 1PM - 3PM on September 26, 2016   Remember: Instructions that are not followed completely may result in serious medical risk, up to and including death, or upon the discretion of your surgeon and anesthesiologist your surgery may need to be rescheduled.  Recuerde: Las instrucciones que no se siguen completamente Heritage manager en un riesgo de salud grave, incluyendo hasta la [place] o a discrecin de su cirujano y Environmental health practitioner, su ciruga se puede posponer.   __X__ 1. Do not eat food or drink liquids after midnight. No gum chewing or hard candies.  No coma alimentos ni tome lquidos despus de la medianoche.  No mastique chicle ni caramelos  duros.     __X__ 2. No alcohol for 24 hours before or after surgery.    No tome alcohol durante las 24 horas antes ni despus de la [place].   _X___ 3. Bring all medications with you on the day of surgery if instructed. BRING ANY NEW MEDICATIONS    Lleve todos los medicamentos con usted el da de su ciruga si se le ha indicado as.   __X__ 4. Notify your doctor if there is any change in your medical condition (cold, fever,                             infections).    Informe a su mdico si hay algn cambio en su condicin mdica (resfriado, fiebre, infecciones).   Do not wear jewelry, make-up, hairpins, clips or nail polish.  No use joyas, maquillajes, pinzas/ganchos para el cabello ni esmalte de uas.  Do not wear lotions, powders, or perfumes. You may wear deodorant.  No use lociones, polvos o perfumes.  Puede usar desodorante.    Do not shave 48 hours prior to surgery. Men may shave face and neck.  No se afeite 48 horas antes de la [place].  Los hombres pueden [employer] cara y el cuello.   Do not bring valuables to the  hospital.   No lleve objetos [place] is not responsible for any belongings or valuables.  [place] no se hace responsable de ningn tipo de pertenencias u objetos de Geographical information systems officer.               Contacts, dentures or bridgework may not be worn into surgery.  Los lentes de [place], las dentaduras postizas o puentes no se pueden usar en la [place].  Leave your suitcase in the car. After surgery it may be brought to your room.  Deje su maleta en el auto.  Despus de la ciruga podr traerla a su habitacin.  For patients admitted to the hospital, discharge time is determined by your treatment team.  Para los pacientes que sean ingresados al hospital, el tiempo en el cual se le dar de alta es determinado por su                equipo de [place].   Patients discharged the day of surgery will not be allowed to drive home. A los pacientes que se les da de alta el mismo da de la ciruga no se les permitir conducir a Holiday representative.   Please read over the following fact sheets that you were given: Por favor  lea las siguientes hojas de informacin que le dieron:      _X___ Take these medicines the morning of surgery with A SIP OF WATER:          [employer] estas medicinas la maana de la ciruga con UN SORBO DE AGUA:  1. NONE  2.   3.   4.       5.  6.  X        TAKE 1/2 BOTTLE OF MAGNESIUM CITRATE NOON THE DAY BEFORE SURGERY ____ Fleet Enema (as directed)          Enema de Fleet (segn lo indicado)    ____ Use CHG Soap as directed          Utilice el jabn de CHG segn lo indicado  ____ Use inhalers on the day of surgery          Use los inhaladores el da de la ciruga  ____ Stop metformin 2 days prior to surgery          Deje de tomar el metformin 2 das antes de la ciruga    ____ Take 1/2 of usual insulin dose the night before surgery and none on the morning of surgery           Tome la mitad de la dosis habitual de insulina la noche antes de la [place] y no tome  nada en la maana de la             ciruga  ____ Stop Coumadin/Plavix/aspirin on          Deje de tomar el Coumadin/Plavix/aspirina el da:  __X__ Stop Anti-inflammatories on IBUPROFEN MOTRIN, ADVIL, ALEVE NO GOODY'S POWDER UNTIL AFTER SURGERY- ONLY TYLENOL FOR PAIN          Deje de tomar antiinflamatorios el da:   ____ Stop supplements until after surgery            Deje de tomar suplementos hasta despus de la ciruga  ____ Bring C-Pap to the hospital          [place] al hospital

## 2016-09-21 NOTE — Pre-Procedure Instructions (Signed)
MEDICAL CLEARANCE REQUEST/EKG/AS INSTRUCTED BY DR J ADAMS CALLED AND FAXED TO [place] AT DR [facility] ALSO CALLED AND FAXED TO LISA AT DR Cherrie Gauze

## 2016-09-21 NOTE — Pre-Procedure Instructions (Signed)
Your procedure is scheduled on: September 27, 2016 Su procedimiento est programado para: Report to [place] a: To find out your arrival time please call [phone] between 1PM - 3PM on September 26, 2016   Remember: Instructions that are not followed completely may result in serious medical risk, up to and including death, or upon the discretion of your surgeon and anesthesiologist your surgery may need to be rescheduled.  Recuerde: Las instrucciones que no se siguen completamente Heritage manager en un riesgo de salud grave, incluyendo hasta la [place] o a discrecin de su cirujano y Environmental health practitioner, su ciruga se puede posponer.   __X__ 1. Do not eat food or drink liquids after midnight. No gum chewing or hard candies.  No coma alimentos ni tome lquidos despus de la [place].  No mastique chicle ni caramelos  duros.     __X__ 2. No alcohol for 24 hours before or after surgery.    No tome alcohol durante las 24 horas antes ni despus de la [place].   _X___ 3. Bring all medications with you on the day of surgery if instructed. BRING ANY NEW MEDICATIONS    Lleve todos los medicamentos con usted el da de su ciruga si se le ha indicado as.   __X__ 4. Notify your doctor if there is any change in your medical condition (cold, fever,                             infections).    Informe a su mdico si hay algn cambio en su condicin mdica (resfriado, fiebre, infecciones).   Do not wear jewelry, make-up, hairpins, clips or nail polish.  No use joyas, maquillajes, pinzas/ganchos para el cabello ni esmalte de uas.  Do not wear lotions, powders, or perfumes. You may wear deodorant.  No use lociones, polvos o perfumes.  Puede usar desodorante.    Do not shave 48 hours prior to surgery. Men may shave face and neck.  No se afeite 48 horas antes de la [place].  Los hombres pueden [employer] cara y el cuello.   Do not bring valuables to the  hospital.   No lleve objetos [place] is not responsible for any belongings or valuables.  [place] no se hace responsable de ningn tipo de pertenencias u objetos de Geographical information systems officer.               Contacts, dentures or bridgework may not be worn into surgery.  Los lentes de [place], las dentaduras postizas o puentes no se pueden usar en la [place].  Leave your suitcase in the car. After surgery it may be brought to your room.  Deje su maleta en el auto.  Despus de la ciruga podr traerla a su habitacin.  For patients admitted to the hospital, discharge time is determined by your treatment team.  Para los pacientes que sean ingresados al hospital, el tiempo en el cual se le dar de alta es determinado por su                equipo de [place].   Patients discharged the day of surgery will not be allowed to drive home. A los pacientes que se les da de alta el mismo da de la ciruga no se les permitir conducir a Holiday representative.   Please read over the following fact sheets that you were given: Por favor  lea las siguientes hojas de informacin que le dieron:      _X___ Take these medicines the morning of surgery with A SIP OF WATER:          [employer] estas medicinas la maana de la ciruga con UN SORBO DE AGUA:  1. NONE  2.   3.   4.       5.  6.  X        TAKE 1/2 BOTTLE OF MAGNESIUM CITRATE NOON THE DAY BEFORE SURGERY ____ Fleet Enema (as directed)          Enema de [place] (segn lo indicado)    ____ Use CHG Soap as directed          Utilice el jabn de CHG segn lo indicado  ____ Use inhalers on the day of surgery          Use los inhaladores el da de la ciruga  ____ Stop metformin 2 days prior to surgery          Deje de tomar el metformin 2 das antes de la ciruga    ____ Take 1/2 of usual insulin dose the night before surgery and none on the morning of surgery           Tome la mitad de la dosis habitual de insulina la noche antes de la [place] y no tome  nada en la maana de la             ciruga  ____ Stop Coumadin/Plavix/aspirin on          Deje de tomar el Coumadin/Plavix/aspirina el da:  __X__ Stop Anti-inflammatories on IBUPROFEN MOTRIN, ADVIL, ALEVE NO GOODY'S POWDER UNTIL AFTER SURGERY- ONLY TYLENOL FOR PAIN          Deje de tomar antiinflamatorios el da:   ____ Stop supplements until after surgery            Deje de tomar suplementos hasta despus de la ciruga  ____ Bring C-Pap to the hospital          [place] al hospital

## 2016-09-27 ENCOUNTER — Ambulatory Visit
Admission: RE | Admit: 2016-09-27 | Discharge: 2016-09-27 | Disposition: A | Discharge status: Home / Self Care | Payer: Medicare Other | Source: Ambulatory Visit | Attending: Urology | Admitting: Urology

## 2016-09-27 ENCOUNTER — Ambulatory Visit: Payer: Medicare Other | Admitting: Anesthesiology

## 2016-09-27 ENCOUNTER — Other Ambulatory Visit: Payer: Self-pay | Admitting: Urology

## 2016-09-27 ENCOUNTER — Encounter
Admission: RE | Disposition: A | Discharge status: Home / Self Care | Payer: Self-pay | Source: Ambulatory Visit | Attending: Urology

## 2016-09-27 ENCOUNTER — Encounter: Payer: Self-pay | Admitting: *Deleted

## 2016-09-27 DIAGNOSIS — C61 Malignant neoplasm of prostate: Secondary | ICD-10-CM

## 2016-09-27 DIAGNOSIS — Z79899 Other long term (current) drug therapy: Secondary | ICD-10-CM | POA: Diagnosis not present

## 2016-09-27 DIAGNOSIS — I10 Essential (primary) hypertension: Secondary | ICD-10-CM | POA: Insufficient documentation

## 2016-09-27 DIAGNOSIS — N3941 Urge incontinence: Secondary | ICD-10-CM | POA: Insufficient documentation

## 2016-09-27 DIAGNOSIS — Z87891 Personal history of nicotine dependence: Secondary | ICD-10-CM | POA: Diagnosis not present

## 2016-09-27 DIAGNOSIS — R972 Elevated prostate specific antigen [PSA]: Secondary | ICD-10-CM

## 2016-09-27 DIAGNOSIS — F79 Unspecified intellectual disabilities: Secondary | ICD-10-CM | POA: Insufficient documentation

## 2016-09-27 HISTORY — PX: PROSTATE BIOPSY: SHX241

## 2016-09-27 SURGERY — BIOPSY, PROSTATE
Anesthesia: General | Wound class: Clean Contaminated

## 2016-09-27 MED ORDER — FENTANYL CITRATE (PF) 100 MCG/2ML IJ SOLN: 25.0000 ug | INTRAMUSCULAR | Status: DC | PRN

## 2016-09-27 MED ORDER — LIDOCAINE HCL 2 % EX GEL
CUTANEOUS | Status: AC
  Filled 2016-09-27: qty 10

## 2016-09-27 MED ORDER — LACTATED RINGERS IV SOLN
INTRAVENOUS | Status: DC
  Administered 2016-09-27 (×2): via INTRAVENOUS

## 2016-09-27 MED ORDER — FAMOTIDINE 20 MG PO TABS
ORAL_TABLET | ORAL | Status: AC
  Administered 2016-09-27: 20 mg via ORAL
  Filled 2016-09-27: qty 1

## 2016-09-27 MED ORDER — PROPOFOL 500 MG/50ML IV EMUL
INTRAVENOUS | Status: DC | PRN
  Administered 2016-09-27: 125 ug/kg/min via INTRAVENOUS

## 2016-09-27 MED ORDER — ONDANSETRON HCL 4 MG/2ML IJ SOLN: 4.0000 mg | Freq: Once | INTRAMUSCULAR | Status: DC | PRN

## 2016-09-27 MED ORDER — EPHEDRINE SULFATE 50 MG/ML IJ SOLN
INTRAMUSCULAR | Status: DC | PRN
  Administered 2016-09-27 (×3): 10 mg via INTRAVENOUS

## 2016-09-27 MED ORDER — LEVOFLOXACIN 500 MG PO TABS
ORAL_TABLET | ORAL | Status: AC
  Administered 2016-09-27: 500 mg via ORAL
  Filled 2016-09-27: qty 1

## 2016-09-27 MED ORDER — LEVOFLOXACIN 500 MG PO TABS
500.0000 mg | ORAL_TABLET | ORAL | Status: AC
  Administered 2016-09-27: 500 mg via ORAL

## 2016-09-27 MED ORDER — MIDAZOLAM HCL 2 MG/2ML IJ SOLN
INTRAMUSCULAR | Status: DC | PRN
  Administered 2016-09-27: 2 mg via INTRAVENOUS

## 2016-09-27 MED ORDER — FAMOTIDINE 20 MG PO TABS
20.0000 mg | ORAL_TABLET | Freq: Once | ORAL | Status: AC
  Administered 2016-09-27: 20 mg via ORAL

## 2016-09-27 MED ORDER — GENTAMICIN IN SALINE 1.6-0.9 MG/ML-% IV SOLN
80.0000 mg | INTRAVENOUS | Status: AC
  Administered 2016-09-27: 80 mg via INTRAVENOUS
  Filled 2016-09-27: qty 50

## 2016-09-27 MED ORDER — MAGNESIUM CITRATE PO SOLN
0.5000 | Freq: Once | ORAL | Status: DC
  Filled 2016-09-27: qty 296

## 2016-09-27 MED ORDER — FENTANYL CITRATE (PF) 100 MCG/2ML IJ SOLN
INTRAMUSCULAR | Status: DC | PRN
  Administered 2016-09-27: 50 ug via INTRAVENOUS

## 2016-09-27 SURGICAL SUPPLY — 20 items
BAG URO DRAIN 2000ML W/SPOUT (MISCELLANEOUS) IMPLANT
BLADE CLIPPER SURG (BLADE) IMPLANT
DRAPE SHEET LG 3/4 BI-LAMINATE (DRAPES) IMPLANT
DRESSING TELFA 4X3 1S ST N-ADH (GAUZE/BANDAGES/DRESSINGS) ×2 IMPLANT
DRSG TELFA 3X8 NADH (GAUZE/BANDAGES/DRESSINGS) ×2 IMPLANT
GAUZE SPONGE 4X4 12PLY STRL (GAUZE/BANDAGES/DRESSINGS) IMPLANT
GLOVE BIO SURGEON STRL SZ 6.5 (GLOVE) ×2 IMPLANT
GLOVE BIO SURGEON STRL SZ7 (GLOVE) ×4 IMPLANT
GUIDE NEEDLE ENDOCAV 16-18 CVR (NEEDLE) ×2 IMPLANT
INST BIOPSY MAXCORE 18GX25 (NEEDLE) ×2 IMPLANT
KIT RM TURNOVER CYSTO AR (KITS) IMPLANT
NDL DEFLUX 3.7X23X350 (MISCELLANEOUS)
NDL SAFETY 18GX1.5 (NEEDLE) IMPLANT
NDL SAFETY 22GX1.5 (NEEDLE) IMPLANT
NEEDLE DEFLUX 3.7X23X350 (MISCELLANEOUS) IMPLANT
NEEDLE GUIDE BIOPSY 644068 (NEEDLE) IMPLANT
PREP PVP WINGED SPONGE (MISCELLANEOUS) IMPLANT
SYRINGE 10CC LL (SYRINGE) IMPLANT
SYRINGE IRR TOOMEY STRL 70CC (SYRINGE) IMPLANT
WATER STERILE IRR 1000ML POUR (IV SOLUTION) IMPLANT

## 2016-09-27 NOTE — Interval H&P Note (Signed)
History and Physical Interval Note:  09/27/2016 10:52 AM  Samuel Shelton  has presented today for surgery, with the diagnosis of prostate cancer  The various methods of treatment have been discussed with the patient and family. After consideration of risks, benefits and other options for treatment, the patient has consented to  Procedure(s): PROSTATE BIOPSY (N/A) as a surgical intervention .  The patient's history has been reviewed, patient examined, no change in status, stable for surgery.  I have reviewed the patient's chart and labs.  Questions were answered to the patient's satisfaction.     Hollice Espy

## 2016-09-27 NOTE — Transfer of Care (Signed)
Immediate Anesthesia Transfer of Care Note  Patient: Samuel Shelton  Procedure(s) Performed: Procedure(s): PROSTATE BIOPSY (N/A)  Patient Location: PACU  Anesthesia Type:General  Level of Consciousness: unresponsive  Airway & Oxygen Therapy: Patient Spontanous Breathing and Patient connected to nasal cannula oxygen  Post-op Assessment: Report given to RN and Post -op Vital signs reviewed and stable  Post vital signs: Reviewed and stable  Last Vitals:  Vitals:   09/27/16 1034 09/27/16 1202  BP: (!) 165/105 (!) 98/56  Pulse: 64 71  Resp: 20 16  Temp: (!) 36 C (!) 36 C    Last Pain:  Vitals:   09/27/16 1202  TempSrc:   PainSc: Asleep      Patients Stated Pain Goal: 2 ([id number])  Complications: No apparent anesthesia complications

## 2016-09-27 NOTE — H&P (View-Only) (Signed)
8:54 AM  09/08/16  Samuel Shelton 08/02/1951 [id number]  Referring provider: Sofie Hartigan, MD [place], [place] [address]  Chief Complaint  Patient presents with  . Prostate Cancer    follow up    HPI: 65 yo M initially presented for evaluation of an elevated PSA to 5.36 ng/dL in Fall 2015 . He also had a 4K study which indicated 43% risk of high grade malignancy if DRE is positive or 26% if DRE is negative. Unfortunately, Samuel Shelton is mentally handicapped and unable to tolerate a DRE/ biopsy in the office.  In the interim, his PSA rose to rose to 7.4 ng/dL on 05/20/15.  He was taken to the OR on 05/2015 at which time DRE in the OR showed no evidence of nodules, enlarged gland.  Pathology consistent wit Gleason 3+3 involving 3/13 cores up to 30% on the right.    He and his healthcare proxy ultimately elected to proceed with active surveillance. PSA on 1/17 2017 rose to 8.9. Repeat PSA today relatively stable at 8.5.  He is accompanied by his health care proxy, Samuel Shelton today.    She mentions today that he's had some issues with urinary incontinence and foul-smelling urine. She is unsure if he may have something physiologic or if this is behavioral.   PMH: Past Medical History:  Diagnosis Date  . Chronic mental illness   . Edema of both legs   . Elevated PSA   . Elevated PSA   . Hypertension   . Mental retardation   . Phimosis     Surgical History: Past Surgical History:  Procedure Laterality Date  . PROSTATE BIOPSY N/A 06/22/2015   Procedure: PROSTATE BIOPSY;  Surgeon: Samuel Espy, MD;  Location: [facility] ORS;  Service: Urology;  Laterality: N/A;    Home Medications:    Medication List       Accurate as of 09/08/16 11:59 PM. Always use your most recent med list.          hydrochlorothiazide 12.5 MG tablet Commonly known as:  HYDRODIURIL Take 12.5 mg by mouth.       Allergies: No Known  Allergies  Family History: Family History  Problem Relation Age of Onset  . Stroke Mother   . Hypertension Sister   . Diabetes type II Sister     Social History:  reports that he has quit smoking. His smoking use included Cigarettes. He smoked 0.50 packs per day. He has never used smokeless tobacco. He reports that he does not drink alcohol or use drugs.   Physical Exam: BP (!) 164/127   Pulse (!) 103   Ht 6' (1.829 m)   Wt 207 lb (93.9 kg)   BMI 28.07 kg/m   Constitutional:  Alert and oriented, No acute distress.  HEENT: Samuel Shel[place] AT, moist mucus membranes.  Trachea midline, no masses. Cardiovascular: No clubbing, cyanosis, or edema. RRR. Respiratory: Normal respiratory effort, no increased work of breathing.  CTAB. GI: Abdomen is soft, nontender, nondistended, no abdominal masses GU: No CVA tenderness.  Skin: No rashes, bruises or suspicious lesions. Neurologic: Grossly intact, no focal deficits, moving all 4 extremities. Psychiatric: sight somewhat limited, answer some questions appropriately but no others. Somewhat tangential.   Laboratory Data: Component     Latest Ref Rng & Units 07/28/2015 01/12/2016 09/08/2016  PSA     0.0 - 4.0 ng/mL 7.6 (H) 8.9 (H) 8.5 (H)   Urinalysis Results for orders  placed or performed in visit on 09/08/16  Microscopic Examination  Result Value Ref Range   WBC, UA 0-5 0 - 5 /hpf   RBC, UA 0-2 0 - 2 /hpf   Epithelial Cells (non renal) 0-10 0 - 10 /hpf   Mucus, UA Present (A) Not Estab.   Bacteria, UA Few None seen/Few  PSA  Result Value Ref Range   Prostate Specific Ag, Serum 8.5 (H) 0.0 - 4.0 ng/mL  Urinalysis, Complete  Result Value Ref Range   Specific Gravity, UA 1.020 1.005 - 1.030   pH, UA 6.0 5.0 - 7.5   Color, UA Yellow Yellow   Appearance Ur Clear Clear   Leukocytes, UA Negative Negative   Protein, UA Negative Negative/Trace   Glucose, UA Negative Negative   Ketones, UA Trace (A) Negative   RBC, UA Negative Negative   Bilirubin,  UA Negative Negative   Urobilinogen, Ur 1.0 0.2 - 1.0 mg/dL   Nitrite, UA Negative Negative   Microscopic Examination See below:   BLADDER SCAN AMB NON-IMAGING  Result Value Ref Range   Scan Result 65ml     Assessment & Plan:  65 year old African-American male with chronic mental illness with newly dx T1c Gleason 3+3 prostate cancer involving 3/12 cores, low volume.   PSA at the time of dx 7.4.    1. Prostate cancer ([place]) PSA relatively stable.  We had a lengthy discussion today regarding active surveillance protocol. As per most active surveillance protocols, recommend a confirmatory repeat biopsy at 1 year or prostate MRI. Given his mental health issues, I do not feel that he be able to lay still in order to tolerate an MRI. As such, I have recommended considering proceeding to the operating room for a confirmatory biopsy and rectal exam under anesthesia. The patient and his health care proxy or agreeable with this plan.  - PSA - Urinalysis, Complete - BLADDER SCAN AMB NON-IMAGING  2. Urge incontinence of urine Increased episodes of incontinence.  UA today unremarkable for urinary tract infection. No evidence of overflow incontinence given low postvoid residual.   Suspect this is mostly behavioral.  3. Essential hypertension Patient severely hypertensive today although asymptomatic. I urged him to follow up with his PCP. Patient's healthcare proxy believes he did not take his blood pressure medicine this morning.   Schedule prostate biopsy in the operating room.  Samuel Espy, MD  [facility] [address], [place], [place] [address] [phone]

## 2016-09-27 NOTE — Op Note (Addendum)
Date of procedure: 09/27/16  Preoperative diagnosis:  1. Prostate cancer   Postoperative diagnosis:  1. Prostate cancer   Procedure: 1. Transrectal ultrasound guided prostate biopsy 2. Exam under anesthesia  Surgeon: Hollice Espy, MD  Anesthesia: General  Complications: None  Intraoperative findings: Normal DRE, 35 cc prostate.  Midline prostatic cyst.   EBL: minimal   Specimens: 12 prostate biopsy + 1 cyst biopsy  Drains: none  Indication: Samuel Shelton is a 65 y.o. patient with prostate cancer on active surveillance he returns today to the OR for prostate biopsy under monitored anesthesia care as well as rectal exam under anesthesia.  After reviewing the management options for treatment, he and his healthcare proxy elected to proceed with the above surgical procedure(s). We have discussed the potential benefits and risks of the procedure, side effects of the proposed treatment, the likelihood of the patient achieving the goals of the procedure, and any potential problems that might occur during the procedure or recuperation. Informed consent has been obtained.  Description of procedure:  The patient was brought to the operating room and placed under monitored anesthesia care.  A universal timeout protocol was performed. The patient did receive IV antibiotics in the form of Levaquin and gentamicin. Once adequately sedated, he was repositioned in the lateral decubitus position with the right side up. His knees were brought to his chest. This point in time, rectal exam was performed under anesthesia which revealed a firm 30 cc prostate, without nodules.  The rectal probe was then placed without difficulty. The prostate was visualized. This revealed no significant irregularities other than a midline prostatic cyst presumably representing a utricle adjacent to the prostatic urethra. There is no significant median lobe. His prostate measured 35 cc.  A total of 12 prostate biopsies  were then performed. These included 6 from the right, 6 from the left in the base, mid, and apex of the prostate as well as the lateral base, lateral mid, and lateral apex. The 13th biopsy was performed of the midline prostatic cyst.  Once complete, the transrectal probe was removed.  He tolerated the procedure well. He was taken to the PACU in stable condition.  Plan: Plan to call patient's aunt (healthcare proxy) with results. We will continue to pursue active surveillance unless his biopsy results are upgraded.  Hollice Espy, M.D.

## 2016-09-27 NOTE — Anesthesia Preprocedure Evaluation (Signed)
Anesthesia Evaluation  Patient identified by MRN, date of birth, ID band Patient awake    Reviewed: Allergy & Precautions, NPO status , Patient's Chart, lab work & pertinent test results  History of Anesthesia Complications Negative for: history of anesthetic complications  Airway Mallampati: II       Dental  (+) Edentulous Upper, Edentulous Lower   Pulmonary neg pulmonary ROS, former smoker,           Cardiovascular hypertension, Pt. on medications      Neuro/Psych PSYCHIATRIC DISORDERS (pt with some mental handicap)    GI/Hepatic negative GI ROS, Neg liver ROS,   Endo/Other  negative endocrine ROS  Renal/GU negative Renal ROS     Musculoskeletal negative musculoskeletal ROS (+)   Abdominal   Peds  Hematology negative hematology ROS (+)   Anesthesia Other Findings   Reproductive/Obstetrics                             Anesthesia Physical Anesthesia Plan  ASA: III  Anesthesia Plan: General   Post-op Pain Management:    Induction: Intravenous  Airway Management Planned: LMA  Additional Equipment:   Intra-op Plan:   Post-operative Plan:   Informed Consent: I have reviewed the patients History and Physical, chart, labs and discussed the procedure including the risks, benefits and alternatives for the proposed anesthesia with the patient or authorized representative who has indicated his/her understanding and acceptance.     Plan Discussed with:   Anesthesia Plan Comments:         Anesthesia Quick Evaluation

## 2016-09-27 NOTE — Anesthesia Postprocedure Evaluation (Signed)
Anesthesia Post Note  Patient: Samuel Shelton  Procedure(s) Performed: Procedure(s) (LRB): PROSTATE BIOPSY (N/A)  Patient location during evaluation: PACU Anesthesia Type: General Level of consciousness: awake and alert Pain management: pain level controlled Vital Signs Assessment: post-procedure vital signs reviewed and stable Respiratory status: spontaneous breathing and respiratory function stable Cardiovascular status: stable Anesthetic complications: no    Last Vitals:  Vitals:   09/27/16 1202 09/27/16 1217  BP: (!) 98/56 110/81  Pulse: 71 66  Resp: 16 15  Temp: (!) 36 C     Last Pain:  Vitals:   09/27/16 1202  TempSrc:   PainSc: Asleep                 Kim Lauver K

## 2016-09-27 NOTE — Discharge Instructions (Signed)
AMBULATORY SURGERY  DISCHARGE INSTRUCTIONS   1) The drugs that you were given will stay in your system until tomorrow so for the next 24 hours you should not:  A) Drive an automobile B) Make any legal decisions C) Drink any alcoholic beverage   2) You may resume regular meals tomorrow.  Today it is better to start with liquids and gradually work up to solid foods.  You may eat anything you prefer, but it is better to start with liquids, then soup and crackers, and gradually work up to solid foods.   3) Please notify your doctor immediately if you have any unusual bleeding, trouble breathing, redness and pain at the surgery site, drainage, fever, or pain not relieved by medication.    4) Additional Instructions:        Please contact your physician with any problems or Same Day Surgery at [phone], Monday through Friday 6 am to 4 pm, or [place] at [facility] number at [phone].   Transrectal Prostate Biopsy Patient Education and Post Procedure Instructions    -Definition A prostate biopsy is the removal of a small amount of tissue from the prostate gland. The tissue is examined to determine whether there is cancer.  -Reasons for Procedure A prostate biopsy is usually done after an abnormal finding by: Digital rectal exam Prostate specific antigen (PSA) blood test A prostate biopsy is the only way to find out if cancer cells are present.  -Possible Complications Problems from the procedure are rare, but all procedures have some risk including: Infection Bruising or lengthy bleeding from the rectum, or in urine or semen Difficulty urinating Reactions to anesthesia Factors that may increase the risk of complications include: Smoking History of bleeding disorders or easy bruising Use of any medications, over-the-counter medications, or herbal supplements Sensitivity or allergy to latex, medications, or anesthesia.  -Prior to Procedure Talk to your  doctor about your medications. Blood thinning medications including aspirin should be stopped 1 week prior to procedure. If prescribed by your cardiologist we may need approval before stopping medications. Use a Fleets enema 2 hours before the procedure. Can be purchased at your pharmacy. Antibiotics will be administered in the clinic prior to procedure.  Please make sure you eat a light meal prior to coming in for your appointment. This can help prevent lightheadedness during the procedure and upset stomach from antibiotics. Please bring someone with you to the procedure to drive you home.  -Anesthesia Transrectal biopsy: Local anesthesia--Just the area that is being operated on is numbed using an injectable anesthetic.  -Description of the Procedure Transrectal biopsy--Your doctor will insert a small ultrasound device into the rectum. This device will produce sound waves to create an image of the prostate. These images will help guide placement of the needle. Your doctor will then insert the needle through the wall of the rectum and into the prostate gland. The procedure should take approximately 15-30 minutes.  -Will It Hurt? You may have discomfort and soreness at the biopsy site. Pain and discomfort after the procedure can be managed with medications.  -Postoperative Care When you return home after the procedure, do the following to help ensure a smooth recovery: Stay hydrated. Drink plenty of fluids for the next few days. Avoid difficult physical activity the day and evening of the procedure. Keep in mind that you may see blood in your urine, stool, or semen for several days. Resume any medications that were stopped when you are advised to do so.  After the sample is taken, it will be sent to a pathologist for examination under a microscope. This doctor will analyze the sample for cancer. You will be scheduled for an appointment to discuss results. If cancer is present, your doctor will  work with you to develop a treatment plan.   -Call Your Doctor or Seek Immediate Medical Attention It is important to monitor your recovery. Alert your doctor to any problems. If any of the following occur, call your doctor or go to the emergency room: Fever 100.5 or greater within 1 week post procedure go directly to ER Call the office for: Blood in the urine more than 1 week or in semen for more than 6 weeks post-biopsy Pain that you cannot control with the medications you have been given Pain, burning, urgency, or frequency of urination Cough, shortness of breath, or chest pain- if severe go to ER Heavy rectal bleeding or bleeding that lasts more than 1 week after the biopsy If you have any questions or concerns please contact our office at ([facility] [address], [place], [place] [address] [phone]

## 2016-09-27 NOTE — Anesthesia Procedure Notes (Signed)
Date/Time: 09/27/2016 11:30 AM Performed by: Nelda Marseille Pre-anesthesia Checklist: Patient identified, Emergency Drugs available, Suction available, Patient being monitored and Timeout performed Oxygen Delivery Method: Nasal cannula

## 2016-09-29 LAB — SURGICAL PATHOLOGY

## 2016-09-30 ENCOUNTER — Telehealth: Payer: Self-pay | Admitting: Urology

## 2016-09-30 NOTE — Telephone Encounter (Signed)
Biopsy results were reviewed with the patient's health care proxy.  He continues to have a low amount of Gleason 3+3, low risk prostate cancer. This is essentially unchanged from his initial biopsy. As such, I would continue to recommend active surveillance. We'll see him back in 4 months with a PSA and possible DRE if tolerated.  Hollice Espy, MD

## 2017-02-03 ENCOUNTER — Ambulatory Visit (INDEPENDENT_AMBULATORY_CARE_PROVIDER_SITE_OTHER): Payer: Medicare Other | Admitting: Urology

## 2017-02-03 ENCOUNTER — Encounter: Payer: Self-pay | Admitting: Urology

## 2017-02-03 VITALS — BP 165/106 | HR 67 | Ht 71.0 in | Wt 196.1 lb

## 2017-02-03 DIAGNOSIS — C61 Malignant neoplasm of prostate: Secondary | ICD-10-CM

## 2017-02-03 DIAGNOSIS — R339 Retention of urine, unspecified: Secondary | ICD-10-CM

## 2017-02-03 DIAGNOSIS — N3000 Acute cystitis without hematuria: Secondary | ICD-10-CM | POA: Diagnosis not present

## 2017-02-03 LAB — URINALYSIS, COMPLETE
Bilirubin, UA: NEGATIVE
Glucose, UA: NEGATIVE
Ketones, UA: NEGATIVE
Nitrite, UA: POSITIVE — AB
Specific Gravity, UA: 1.02 (ref 1.005–1.030)
Urobilinogen, Ur: 1 mg/dL (ref 0.2–1.0)
pH, UA: 7 (ref 5.0–7.5)

## 2017-02-03 LAB — MICROSCOPIC EXAMINATION
Bacteria, UA: NONE SEEN
Epithelial Cells (non renal): NONE SEEN /hpf (ref 0–10)
RBC, UA: NONE SEEN /hpf (ref 0–?)
WBC, UA: >30 /hpf — AB (ref 0–?)

## 2017-02-03 LAB — BLADDER SCAN AMB NON-IMAGING: Scan Result: 319

## 2017-02-03 MED ORDER — CIPROFLOXACIN HCL 500 MG PO TABS: 500.0000 mg | ORAL_TABLET | Freq: Two times a day (BID) | ORAL | 0 refills | Status: DC

## 2017-02-03 NOTE — Progress Notes (Signed)
10:26 AM  02/03/17  Samuel Shelton 06-10-1951 [id number]  Referring provider: Sofie Hartigan, MD [place], [place] [id number]  Chief Complaint  Patient presents with  . Follow-up    prostate cancer     HPI: [name] With a history of low risk prostate cancer diagnosed in 2016 on surveillance to return today for routine follow-up.   He initially presented for evaluation of an elevated PSA to 5.36 ng/dL in Fall 2015 . He also had a 4K study which indicated 43% risk of high grade malignancy if DRE is positive or 26% if DRE is negative. Unfortunately, Samuel Shelton is mentally handicapped and unable to tolerate a DRE/ biopsy in the office.  In the interim, his PSA rose to rose to 7.4 ng/dL on 05/20/15.  He was taken to the OR on 05/2015 at which time DRE in the OR showed no evidence of nodules, enlarged gland.  Pathology consistent wit Gleason 3+3 involving 3/13 cores up to 30% on the right.    He and his healthcare proxy ultimately elected to proceed with active surveillance.    Most recently, he returned to the operating room on 09/2016 for repeat confirmatory biopsy.  This showed a total of 3 of 12 cores on the left with Gleason 3+3 involving up to 50% of the tissue.  Exam under anesthesia was unremarkable. Prostate volume measured 35 cc.  His PSA has been relatively stable. Most recent value was 8.5 on 9/17.    He is accompanied by his health care proxy, Samuel Shelton today.  She does mention over the past month or so, he's been having worsening urinary frequency, urgency, and foul-smelling urine. He is also occasionally incontinent at times which is a change from his baseline. No fevers or chills.  UA today is frankly positive for infection.   PMH: Past Medical History:  Diagnosis Date  . Chronic mental illness   . Edema of both legs   . Elevated PSA   . Elevated PSA   . Hypertension   . Mental retardation   . Phimosis      Surgical History: Past Surgical History:  Procedure Laterality Date  . PROSTATE BIOPSY N/A 06/22/2015   Procedure: PROSTATE BIOPSY;  Surgeon: Samuel Espy, MD;  Location: [facility] ORS;  Service: Urology;  Laterality: N/A;  . PROSTATE BIOPSY N/A 09/27/2016   Procedure: PROSTATE BIOPSY;  Surgeon: Samuel Espy, MD;  Location: [facility] ORS;  Service: Urology;  Laterality: N/A;    Home Medications:  Allergies as of 02/03/2017   No Known Allergies     Medication List       Accurate as of 02/03/17 10:26 AM. Always use your most recent med list.          ciprofloxacin 500 MG tablet Commonly known as:  CIPRO Take 1 tablet (500 mg total) by mouth every 12 (twelve) hours.   hydrochlorothiazide 12.5 MG tablet Commonly known as:  HYDRODIURIL Take 12.5 mg by mouth.       Allergies: No Known Allergies  Family History: Family History  Problem Relation Age of Onset  . Stroke Mother   . Hypertension Sister   . Diabetes type II Sister     Social History:  reports that he has quit smoking. His smoking use included Cigarettes. He smoked 0.50 packs per day. He has never used smokeless tobacco. He reports that he does not drink alcohol or use drugs.   Physical  Exam: BP (!) 165/106   Pulse 67   Ht 5\' 11"  (1.803 m)   Wt 196 lb 1.6 oz (89 kg)   BMI 27.35 kg/m   Constitutional:  Alert and oriented, No acute distress.  HEENT: [place] AT, moist mucus membranes.  Trachea midline, no masses. Cardiovascular: No clubbing, cyanosis, or edema. Respiratory: Normal respiratory effort, no increased work of breathing.   GI: Abdomen is soft, nontender, nondistended, no abdominal masses GU: No CVA tenderness.  Skin: No rashes, bruises or suspicious lesions. Neurologic: Grossly intact, no focal deficits, moving all 4 extremities. Psychiatric: Insight somewhat limited, answer some questions appropriately but no others. Somewhat tangential.  Excited today about his birthday and [employer] basketball game last  night.   Laboratory Data: Component     Latest Ref Rng & Units 07/28/2015 01/12/2016 09/08/2016  PSA     0.0 - 4.0 ng/mL 7.6 (H) 8.9 (H) 8.5 (H)   Urinalysis Results for orders placed or performed in visit on 02/03/17  Microscopic Examination  Result Value Ref Range   WBC, UA >30 (A) 0 - 5 /hpf   RBC, UA None seen 0 - 2 /hpf   Epithelial Cells (non renal) None seen 0 - 10 /hpf   Bacteria, UA None seen None seen/Few  Urinalysis, Complete  Result Value Ref Range   Specific Gravity, UA 1.020 1.005 - 1.030   pH, UA 7.0 5.0 - 7.5   Color, UA Yellow Yellow   Appearance Ur Turbid (A) Clear   Leukocytes, UA 3+ (A) Negative   Protein, UA 2+ (A) Negative/Trace   Glucose, UA Negative Negative   Ketones, UA Negative Negative   RBC, UA 2+ (A) Negative   Bilirubin, UA Negative Negative   Urobilinogen, Ur 1.0 0.2 - 1.0 mg/dL   Nitrite, UA Positive (A) Negative   Microscopic Examination See below:   Bladder Scan (Post Void Residual) in office  Result Value Ref Range   Scan Result 319      Assessment & Plan:  66 year old African-American male with chronic mental illness with dx T1c Gleason 3+3 prostate cancer involving 3/12 cores, low volume, 2016.  Confirmatory biopsy 10/217 with stable disease volume and grade.    PSA at the time of dx 7.4.    1. Prostate cancer ([place]) Defer recheck today in setting of UTI-recheck in one month. Importance of ongoing routine follow-up was stressed again today - PSA - Urinalysis, Complete - BLADDER SCAN AMB NON-IMAGING  2. UTI  Increased episodes of incontinence, frequency, urgency, and foul-smelling urine Cipro 500 mg bid x 7 days sent to pharmacy Adjust based on culture and sensitivity data  3. Incomplete bladder emtpying Post void residual elevated today Suspect this may be related to UTI Will repeat bladder scan next visit  Return in about 4 weeks (around 03/03/2017) for PSA/ PVR.  Samuel Espy, MD  [facility] [address], [place],  [address] [phone]

## 2017-02-06 LAB — CULTURE, URINE COMPREHENSIVE

## 2017-03-17 ENCOUNTER — Ambulatory Visit: Payer: Medicare Other | Admitting: Urology

## 2017-03-17 ENCOUNTER — Encounter: Payer: Self-pay | Admitting: Urology

## 2017-04-26 ENCOUNTER — Ambulatory Visit (INDEPENDENT_AMBULATORY_CARE_PROVIDER_SITE_OTHER): Payer: Medicare Other | Admitting: Urology

## 2017-04-26 ENCOUNTER — Encounter: Payer: Self-pay | Admitting: Urology

## 2017-04-26 VITALS — BP 201/110 | HR 90 | Ht 71.0 in | Wt 200.0 lb

## 2017-04-26 DIAGNOSIS — C61 Malignant neoplasm of prostate: Secondary | ICD-10-CM | POA: Diagnosis not present

## 2017-04-26 DIAGNOSIS — I10 Essential (primary) hypertension: Secondary | ICD-10-CM

## 2017-04-26 DIAGNOSIS — R339 Retention of urine, unspecified: Secondary | ICD-10-CM | POA: Diagnosis not present

## 2017-04-26 LAB — URINALYSIS, COMPLETE
Bilirubin, UA: NEGATIVE
Glucose, UA: NEGATIVE
Ketones, UA: NEGATIVE
Leukocytes, UA: NEGATIVE
Nitrite, UA: NEGATIVE
Protein, UA: NEGATIVE
RBC, UA: NEGATIVE
Specific Gravity, UA: 1.025 (ref 1.005–1.030)
Urobilinogen, Ur: 0.2 mg/dL (ref 0.2–1.0)
pH, UA: 5.5 (ref 5.0–7.5)

## 2017-04-26 LAB — BLADDER SCAN AMB NON-IMAGING

## 2017-04-26 NOTE — Progress Notes (Signed)
1:44 PM  04/26/17  Samuel Shelton 09-04-1951 [id number]  Referring provider: Sofie Hartigan, MD [facility] [facility] [employer] [place], [place] [address]  Chief Complaint  Patient presents with  . Prostate Cancer    1 month    HPI: [name] With a history of low risk prostate cancer diagnosed in 2016 on surveillance to return today for routine follow-up.   He initially presented for evaluation of an elevated PSA to 5.36 ng/dL in Fall 2015 . He also had a 4K study which indicated 43% risk of high grade malignancy if DRE is positive or 26% if DRE is negative. Unfortunately, Samuel Shelton is mentally handicapped and unable to tolerate a DRE/ biopsy in the office.  In the interim, his PSA rose to rose to 7.4 ng/dL on 05/20/15.  He was taken to the OR on 05/2015 at which time DRE in the OR showed no evidence of nodules, enlarged gland.  Pathology consistent wit Gleason 3+3 involving 3/13 cores up to 30% on the right.    He and his healthcare proxy ultimately elected to proceed with active surveillance.    Most recently, he returned to the operating room on 09/2016 for repeat confirmatory biopsy.  This showed a total of 3 of 12 cores on the left with Gleason 3+3 involving up to 50% of the tissue.  Exam under anesthesia was unremarkable. Prostate volume measured 35 cc.  His PSA has been relatively stable. Most recent value was 8.5 on 9/17.  He is overdue for his PSA today as on his previous visit, his UA was frankly positive for infection there for PSA was deferred in the setting of UTI.  Since last visit, he was treated with Cipro for urinary tract infection. At the time, he was having increasing urinary frequency, urgency, and episodes of incontinence. Since being treated, he has had dramatic improvement in his urinary symptoms and has no complaints today. His healthcare proxy also reports that he seems back to his baseline.  He is accompanied by his health care proxy, Samuel Shelton today.      PMH: Past Medical History:  Diagnosis Date  . Chronic mental illness   . Edema of both legs   . Elevated PSA   . Elevated PSA   . Hypertension   . Mental retardation   . Phimosis     Surgical History: Past Surgical History:  Procedure Laterality Date  . PROSTATE BIOPSY N/A 06/22/2015   Procedure: PROSTATE BIOPSY;  Surgeon: Hollice Espy, MD;  Location: [facility] ORS;  Service: Urology;  Laterality: N/A;  . PROSTATE BIOPSY N/A 09/27/2016   Procedure: PROSTATE BIOPSY;  Surgeon: Hollice Espy, MD;  Location: [facility] ORS;  Service: Urology;  Laterality: N/A;    Home Medications:  Allergies as of 04/26/2017   No Known Allergies     Medication List       Accurate as of 04/26/17  1:44 PM. Always use your most recent med list.          hydrochlorothiazide 12.5 MG tablet Commonly known as:  HYDRODIURIL Take 12.5 mg by mouth.       Allergies: No Known Allergies  Family History: Family History  Problem Relation Age of Onset  . Stroke Mother   . Hypertension Sister   . Diabetes type II Sister     Social History:  reports that he has quit smoking. His smoking use included Cigarettes. He smoked 0.50 packs per day. He has never used smokeless tobacco. He reports  that he does not drink alcohol or use drugs.   ROS: UROLOGY Frequent Urination?: No Hard to postpone urination?: No Burning/pain with urination?: No Get up at night to urinate?: No Leakage of urine?: No Urine stream starts and stops?: No Trouble starting stream?: No Do you have to strain to urinate?: No Blood in urine?: No Urinary tract infection?: No Sexually transmitted disease?: No Injury to kidneys or bladder?: No Painful intercourse?: No Weak stream?: No Erection problems?: No Penile pain?: No Gastrointestinal Nausea?: No Vomiting?: No Indigestion/heartburn?: No Diarrhea?: No Constipation?: No Constitutional Fever: No Night sweats?: No Weight loss?: No Fatigue?: No Skin Skin rash/lesions?:  No Itching?: No Eyes Blurred vision?: No Double vision?: No Ears/Nose/Throat Sore throat?: No Sinus problems?: No Hematologic/Lymphatic Swollen glands?: No Easy bruising?: No Cardiovascular Leg swelling?: No Chest pain?: No Respiratory Cough?: No Shortness of breath?: No Endocrine Excessive thirst?: No Musculoskeletal Back pain?: No Joint pain?: No Neurological Headaches?: No Dizziness?: No Psychologic Depression?: No Anxiety?: No    Physical Exam: BP (!) 201/110   Pulse 90   Ht 5\' 11"  (1.803 m)   Wt 200 lb (90.7 kg)   BMI 27.89 kg/m   Constitutional:  Alert and oriented, No acute distress.  HEENT: [place] AT, moist mucus membranes.  Trachea midline, no masses. Cardiovascular: No clubbing, cyanosis, or edema. Respiratory: Normal respiratory effort, no increased work of breathing.   GI: Abdomen is soft, nontender, nondistended, no abdominal masses GU: No CVA tenderness.  Skin: No rashes, bruises or suspicious lesions. Neurologic: Grossly intact, no focal deficits, moving all 4 extremities. Psychiatric: Insight somewhat limited, answer some questions appropriately but no others. Somewhat tangential.     Laboratory Data: Component     Latest Ref Rng & Units 07/28/2015 01/12/2016 09/08/2016  PSA     0.0 - 4.0 ng/mL 7.6 (H) 8.9 (H) 8.5 (H)   Urinalysis Results for orders placed or performed in visit on 04/26/17  BLADDER SCAN AMB NON-IMAGING  Result Value Ref Range   Scan Result 247ml      Assessment & Plan:  66 year old African-American male with chronic mental illness with dx T1c Gleason 3+3 prostate cancer involving 3/12 cores, low volume, 2016.  Confirmatory biopsy 10/217 with stable disease volume and grade.    PSA at the time of dx 7.4.    1. Prostate cancer ([place]) PSA today, will call with results Does not tolerated rectal exams well Importance of ongoing routine follow-up was stressed again today Recommend at least q6 month PSA - PSA - Urinalysis,  Complete - BLADDER SCAN AMB NON-IMAGING   2. Incomplete bladder emtpying Post void residual remains elevated today, stable Asymptomatic other than for UTI 1 Will follow  3. Essential Hypertension Asymptomatic, managed by PCP BP likely more elevated today as the patient has a needle phobia I advised arranging follow-up with PCP sooner particular control his blood pressure  Return in about 6 months (around 10/27/2017) for PSA, PVR.  Hollice Espy, MD  [facility] [address] [place],  [address] [phone]

## 2017-04-27 ENCOUNTER — Telehealth: Payer: Self-pay

## 2017-04-27 LAB — PSA: Prostate Specific Ag, Serum: 7.2 ng/mL — ABNORMAL HIGH (ref 0.0–4.0)

## 2017-04-27 NOTE — Telephone Encounter (Signed)
No answer

## 2017-04-27 NOTE — Telephone Encounter (Signed)
-----   Message from Hollice Espy, MD sent at 04/27/2017  9:10 AM EDT ----- PSA stable, great news!  Hollice Espy, MD

## 2017-04-28 NOTE — Telephone Encounter (Signed)
No answer

## 2017-05-01 NOTE — Telephone Encounter (Signed)
No answer. Will send a letter.  

## 2017-10-25 ENCOUNTER — Other Ambulatory Visit: Payer: Medicare Other

## 2017-10-25 DIAGNOSIS — C61 Malignant neoplasm of prostate: Secondary | ICD-10-CM

## 2017-10-26 LAB — PSA: Prostate Specific Ag, Serum: 11.2 ng/mL — ABNORMAL HIGH (ref 0.0–4.0)

## 2017-10-27 ENCOUNTER — Ambulatory Visit (INDEPENDENT_AMBULATORY_CARE_PROVIDER_SITE_OTHER): Payer: Medicare Other | Admitting: Urology

## 2017-10-27 ENCOUNTER — Encounter: Payer: Self-pay | Admitting: Urology

## 2017-10-27 VITALS — BP 183/117 | HR 81 | Ht 71.0 in | Wt 229.8 lb

## 2017-10-27 DIAGNOSIS — C61 Malignant neoplasm of prostate: Secondary | ICD-10-CM

## 2017-10-27 DIAGNOSIS — R339 Retention of urine, unspecified: Secondary | ICD-10-CM | POA: Diagnosis not present

## 2017-10-27 DIAGNOSIS — I10 Essential (primary) hypertension: Secondary | ICD-10-CM | POA: Diagnosis not present

## 2017-10-27 LAB — URINALYSIS, COMPLETE
Bilirubin, UA: NEGATIVE
Glucose, UA: NEGATIVE
Ketones, UA: NEGATIVE
Leukocytes, UA: NEGATIVE
Nitrite, UA: NEGATIVE
Protein, UA: NEGATIVE
RBC, UA: NEGATIVE
Specific Gravity, UA: 1.02 (ref 1.005–1.030)
Urobilinogen, Ur: 0.2 mg/dL (ref 0.2–1.0)
pH, UA: 7 (ref 5.0–7.5)

## 2017-10-27 LAB — BLADDER SCAN AMB NON-IMAGING: Scan Result: 261

## 2017-10-27 MED ORDER — TAMSULOSIN HCL 0.4 MG PO CAPS: 0.4000 mg | ORAL_CAPSULE | Freq: Every day | ORAL | 11 refills | Status: DC

## 2017-10-27 NOTE — Progress Notes (Signed)
9:01 AM  10/27/17  Samuel Shelton 1951/11/30 [id number]  Referring provider: Sofie Hartigan, MD [place], [place] [address]  Chief Complaint  Patient presents with  . Prostate Cancer    HPI: [name] With a history of low risk prostate cancer diagnosed in 2016 on surveillance to return today for routine follow-up.   He initially presented for evaluation of an elevated PSA to 5.36 ng/dL in Fall 2015 . He also had a 4K study which indicated 43% risk of high grade malignancy if DRE is positive or 26% if DRE is negative. Unfortunately, [name] is mentally handicapped and unable to tolerate a DRE/ biopsy in the office.  In the interim, his PSA rose to rose to 7.4 ng/dL on 05/20/15.  He was taken to the OR on 05/2015 at which time DRE in the OR showed no evidence of nodules, enlarged gland.  Pathology consistent wit Gleason 3+3 involving 3/13 cores up to 30% on the right.    He and his healthcare proxy ultimately elected to proceed with active surveillance.    Most recently, he returned to the operating room on 09/2016 for repeat confirmatory biopsy.  This showed a total of 3 of 12 cores on the left with Gleason 3+3 involving up to 50% of the tissue.  Exam under anesthesia was unremarkable. Prostate volume measured 35 cc.  His PSA has been relatively stable in the 7- 8 range but recently jumped up to 11.2.   He has had an elevated postvoid residual on multiple previous occasional UTI.  It is difficult to assess his voiding symptoms based on his mental status.  His caretaker reports that he goes to the bathroom frequently and she is not sure if he empties all the way.  He does not seem to be bothered by his urinary symptoms.  Postvoid residual today is elevated.  He was asked to return to the bathroom and provide a urine specimen at which time he was able to fill up the cup.  As such, his ability to empty is better than PVR reflects.  He is accompanied by his health  care proxy, Samuel Shelton today.     PMH: Past Medical History:  Diagnosis Date  . Chronic mental illness   . Edema of both legs   . Elevated PSA   . Elevated PSA   . Hypertension   . Mental retardation   . Phimosis     Surgical History: Past Surgical History:  Procedure Laterality Date  . PROSTATE BIOPSY N/A 06/22/2015   Procedure: PROSTATE BIOPSY;  Surgeon: Hollice Espy, MD;  Location: [facility] ORS;  Service: Urology;  Laterality: N/A;  . PROSTATE BIOPSY N/A 09/27/2016   Procedure: PROSTATE BIOPSY;  Surgeon: Hollice Espy, MD;  Location: [facility] ORS;  Service: Urology;  Laterality: N/A;    Home Medications:  Allergies as of 10/27/2017   No Known Allergies     Medication List       Accurate as of 10/27/17  9:01 AM. Always use your most recent med list.          hydrochlorothiazide 12.5 MG tablet Commonly known as:  HYDRODIURIL Take 12.5 mg by mouth.   tamsulosin 0.4 MG Caps capsule Commonly known as:  FLOMAX Take 1 capsule (0.4 mg total) by mouth daily.       Allergies: No Known Allergies  Family History: Family History  Problem Relation Age of Onset  . Stroke Mother   . Hypertension Sister   .  Diabetes type II Sister     Social History:  reports that he has quit smoking. His smoking use included Cigarettes. He smoked 0.50 packs per day. He has never used smokeless tobacco. He reports that he does not drink alcohol or use drugs.   ROS: UROLOGY Frequent Urination?: No Hard to postpone urination?: No Burning/pain with urination?: No Get up at night to urinate?: No Leakage of urine?: No Urine stream starts and stops?: No Trouble starting stream?: No Do you have to strain to urinate?: No Blood in urine?: No Urinary tract infection?: No Sexually transmitted disease?: No Injury to kidneys or bladder?: No Painful intercourse?: No Weak stream?: No Erection problems?: No Penile pain?: No Gastrointestinal Nausea?: No Vomiting?:  No Indigestion/heartburn?: No Diarrhea?: No Constipation?: No Constitutional Fever: No Night sweats?: No Weight loss?: No Fatigue?: No Skin Skin rash/lesions?: No Itching?: No Eyes Blurred vision?: No Double vision?: No Ears/Nose/Throat Sore throat?: No Sinus problems?: No Hematologic/Lymphatic Swollen glands?: No Easy bruising?: No Cardiovascular Leg swelling?: No Chest pain?: No Respiratory Cough?: No Shortness of breath?: No Endocrine Excessive thirst?: No Musculoskeletal Back pain?: No Joint pain?: No Neurological Headaches?: No Dizziness?: No Psychologic Depression?: No Anxiety?: No   Physical Exam: BP (!) 183/117 (BP Location: Right Arm, Patient Position: Sitting, Cuff Size: Normal)   Pulse 81   Ht 5\' 11"  (1.803 m)   Wt 229 lb 12.8 oz (104.2 kg)   BMI 32.05 kg/m   Constitutional:  Alert and oriented, No acute distress.  HEENT: [employer] AT, moist mucus membranes.  Trachea midline, no masses. Cardiovascular: No clubbing, cyanosis, or edema. Respiratory: Normal respiratory effort, no increased work of breathing.   GI: Abdomen is soft, nontender, nondistended, no abdominal masses GU: No CVA tenderness.  Skin: No rashes, bruises or suspicious lesions. Neurologic: Grossly intact, no focal deficits, moving all 4 extremities. Psychiatric: Insight somewhat limited, answer some questions appropriately but no others. Somewhat tangential.     Laboratory Data: Component     Latest Ref Rng & Units 07/28/2015 01/12/2016 09/08/2016 04/26/2017  Prostate Specific Ag, Serum     0.0 - 4.0 ng/mL 7.6 (H) 8.9 (H) 8.5 (H) 7.2 (H)   Component     Latest Ref Rng & Units 10/25/2017  Prostate Specific Ag, Serum     0.0 - 4.0 ng/mL 11.2 (H)    Urinalysis Results for orders placed or performed in visit on 10/27/17  Bladder Scan (Post Void Residual) in office  Result Value Ref Range   Scan Result 261    PVR 261, asked to go back to the bathroom and provide a urine specimen,  able to fill up cup, 100 cc   UA today reviewed, dip is negative as well as microscopic exam.  No evidence of UTI.  See epic for details.  Assessment & Plan:  66 year old African-American male with chronic mental illness with dx T1c Gleason 3+3 prostate cancer involving 3/12 cores, low volume, 2016.  Confirmatory biopsy 10/217 with stable disease volume and grade.    PSA at the time of dx 7.4.    1. Prostate cancer ([place]) Rising PSA today, etiology of this is unclear Does not tolerated rectal exams well Recommend very close follow-up with repeat PSA in 3 months If PSA mains elevated at that time, we will discuss repeat biopsy versus MRI versus pursuing treatment in the setting of rising PSA - PSA - Urinalysis, Complete - BLADDER SCAN AMB NON-IMAGING   2. Incomplete bladder emtpying Post void residual remains elevated today  Able to empty or when asked to return to bathroom Suspect part of this is behavioral We will start Flomax 0.4 mg nightly to see if this helps, recheck PVR next visit  3. Essential Hypertension Asymptomatic This is a chronic issue at each visit Again encouraged follow-up with PCP for this  Return in about 3 months (around 01/27/2018) for PSA / PVR (PSA Just prior to visit please) .  Hollice Espy, MD  [facility] [address] [place], [place] [address] [phone]

## 2017-12-31 ENCOUNTER — Emergency Department
Admission: EM | Admit: 2017-12-31 | Discharge: 2017-12-31 | Disposition: A | Discharge status: Home / Self Care | Payer: Medicare Other | Attending: Emergency Medicine | Admitting: Emergency Medicine

## 2017-12-31 ENCOUNTER — Other Ambulatory Visit: Payer: Self-pay

## 2017-12-31 ENCOUNTER — Encounter: Payer: Self-pay | Admitting: Emergency Medicine

## 2017-12-31 DIAGNOSIS — Z87891 Personal history of nicotine dependence: Secondary | ICD-10-CM | POA: Diagnosis not present

## 2017-12-31 DIAGNOSIS — B356 Tinea cruris: Secondary | ICD-10-CM | POA: Diagnosis not present

## 2017-12-31 DIAGNOSIS — F78 Other intellectual disabilities: Secondary | ICD-10-CM | POA: Insufficient documentation

## 2017-12-31 DIAGNOSIS — Z79899 Other long term (current) drug therapy: Secondary | ICD-10-CM | POA: Insufficient documentation

## 2017-12-31 DIAGNOSIS — R21 Rash and other nonspecific skin eruption: Secondary | ICD-10-CM | POA: Diagnosis present

## 2017-12-31 DIAGNOSIS — I10 Essential (primary) hypertension: Secondary | ICD-10-CM | POA: Diagnosis not present

## 2017-12-31 MED ORDER — HYDROCHLOROTHIAZIDE 12.5 MG PO CAPS
12.5000 mg | ORAL_CAPSULE | Freq: Every day | ORAL | Status: DC
  Administered 2017-12-31: 12.5 mg via ORAL

## 2017-12-31 MED ORDER — HYDROCHLOROTHIAZIDE 12.5 MG PO CAPS
ORAL_CAPSULE | ORAL | Status: AC
  Filled 2017-12-31: qty 1

## 2017-12-31 MED ORDER — CLOTRIMAZOLE 1 % EX CREA: 1.0000 "application " | TOPICAL_CREAM | Freq: Two times a day (BID) | CUTANEOUS | 0 refills | Status: DC

## 2017-12-31 NOTE — ED Triage Notes (Signed)
Pt caregiver states that pt has rash in genital area and on his bottom. Pt in NAD at this time.

## 2017-12-31 NOTE — ED Notes (Signed)
Pt discharged to legal guardian.

## 2017-12-31 NOTE — ED Provider Notes (Signed)
[facility] Emergency Department Provider Note  ____________________________________________   First MD Initiated Contact with Patient 12/31/17 1634     (approximate)  I have reviewed the triage vital signs and the nursing notes.   HISTORY  Chief Complaint Rash   HPI Samuel Shelton is a 67 y.o. male with a history of mental retardation was presenting to the emergency department today with several days of pain and rash to his groin as well as buttock.  He is here with his niece who is also his caretaker who says that the patient has been walking like he is having irritation to his groin.  She says that he has poor hygiene and also reuses his underwear over multiple days.   Past Medical History:  Diagnosis Date  . Chronic mental illness   . Edema of both legs   . Elevated PSA   . Elevated PSA   . Hypertension   . Mental retardation   . Phimosis     Patient Active Problem List   Diagnosis Date Noted  . Edema leg 08/20/2014  . Chronic mental disorder 08/20/2014  . BP (high blood pressure) 08/20/2014  . Intellectual disability 08/20/2014    Past Surgical History:  Procedure Laterality Date  . PROSTATE BIOPSY N/A 06/22/2015   Procedure: PROSTATE BIOPSY;  Surgeon: Hollice Espy, MD;  Location: [facility] ORS;  Service: Urology;  Laterality: N/A;  . PROSTATE BIOPSY N/A 09/27/2016   Procedure: PROSTATE BIOPSY;  Surgeon: Hollice Espy, MD;  Location: [facility] ORS;  Service: Urology;  Laterality: N/A;    Prior to Admission medications   Medication Sig Start Date End Date Taking? Authorizing Provider  hydrochlorothiazide (HYDRODIURIL) 12.5 MG tablet Take 12.5 mg by mouth.  03/02/15   [provider]  tamsulosin (FLOMAX) 0.4 MG CAPS capsule Take 1 capsule (0.4 mg total) by mouth daily. 10/27/17   Hollice Espy, MD    Allergies Patient has no known allergies.  Family History  Problem Relation Age of Onset  . Stroke Mother   . Hypertension Sister     . Diabetes type II Sister     Social History Social History   Tobacco Use  . Smoking status: Former Smoker    Packs/day: 0.50    Types: Cigarettes  . Smokeless tobacco: Never Used  Substance Use Topics  . Alcohol use: No    Alcohol/week: 0.0 oz  . Drug use: No    Review of Systems  Constitutional: No fever/chills Eyes: No visual changes. ENT: No sore throat. Cardiovascular: Denies chest pain. Respiratory: Denies shortness of breath. Gastrointestinal: No abdominal pain.  No nausea, no vomiting.  No diarrhea.  No constipation. Genitourinary: Negative for dysuria. Musculoskeletal: Negative for back pain. Skin: As above Neurological: Negative for headaches, focal weakness or numbness.   ____________________________________________   PHYSICAL EXAM:  VITAL SIGNS: ED Triage Vitals  Enc Vitals Group     BP 12/31/17 1606 (!) 197/126     Pulse Rate 12/31/17 1606 98     Resp 12/31/17 1606 16     Temp 12/31/17 1606 99.7 F (37.6 C)     Temp Source 12/31/17 1606 Oral     SpO2 12/31/17 1606 96 %     Weight 12/31/17 1605 229 lb (103.9 kg)     Height --      Head Circumference --      Peak Flow --      Pain Score --      Pain Loc --  Pain Edu? --      Excl. in [place]? --     Constitutional: Alert and oriented. Well appearing and in no acute distress. Eyes: Conjunctivae are normal.  Head: Atraumatic. Nose: No congestion/rhinnorhea. Mouth/Throat: Mucous membranes are moist.  Neck: No stridor.   Cardiovascular: Normal rate, regular rhythm. Grossly normal heart sounds.   Respiratory: Normal respiratory effort.  No retractions. Lungs CTAB. Gastrointestinal: Soft and nontender. No distention. No CVA tenderness. Musculoskeletal: No lower extremity tenderness nor edema.  No joint effusions. Neurologic:  Normal speech and language. No gross focal neurologic deficits are appreciated. Skin: Scaling, flaking skin to the bilateral inner thighs extending to the perianal region.   There is no induration.  No tenderness to palpation.  No pus.  No penile swelling or testicular swelling.  No bullae present. Psychiatric: Mood and affect are normal. Speech and behavior are normal.  ____________________________________________   LABS (all labs ordered are listed, but only abnormal results are displayed)  Labs Reviewed - No data to display ____________________________________________  EKG   ____________________________________________  RADIOLOGY   ____________________________________________   PROCEDURES  Procedure(s) performed:   Procedures  Critical Care performed:   ____________________________________________   INITIAL IMPRESSION / ASSESSMENT AND PLAN / ED COURSE  Pertinent labs & imaging results that were available during my care of the patient were reviewed by me and considered in my medical decision making (see chart for details).  DDX: Skin exfoliation, tinea cruris, cellulitis, contact dermatitis, eczema As part of my medical decision making, I reviewed the following data within the electronic MEDICAL RECORD NUMBER Notes from prior ED visits  Patient with what appears to be tinea cruris.  I discussed keeping the area dry as well as using topical medications over the next several weeks to resolve the issue.  The patient's caretaker also knows that the patient was practice better hygiene.  She is understanding of the diagnosis as well as the treatment plan willing to comply.     ____________________________________________   FINAL CLINICAL IMPRESSION(S) / ED DIAGNOSES  Tinea cruris    NEW MEDICATIONS STARTED DURING THIS VISIT:  This SmartLink is deprecated. Use AVSMEDLIST instead to display the medication list for a patient.   Note:  This document was prepared using Dragon voice recognition software and may include unintentional dictation errors.     Orbie Pyo, MD 12/31/17 [phone]

## 2018-01-18 ENCOUNTER — Other Ambulatory Visit: Payer: Medicare Other

## 2018-01-18 DIAGNOSIS — C61 Malignant neoplasm of prostate: Secondary | ICD-10-CM

## 2018-01-19 ENCOUNTER — Other Ambulatory Visit: Payer: Medicare Other

## 2018-01-19 LAB — PSA: Prostate Specific Ag, Serum: 11.1 ng/mL — ABNORMAL HIGH (ref 0.0–4.0)

## 2018-01-26 ENCOUNTER — Ambulatory Visit: Payer: Medicare Other | Admitting: Urology

## 2018-02-15 ENCOUNTER — Ambulatory Visit: Payer: Medicare Other | Admitting: Urology

## 2018-02-20 ENCOUNTER — Encounter: Payer: Self-pay | Admitting: Urology

## 2018-02-20 ENCOUNTER — Ambulatory Visit: Payer: Medicare Other | Admitting: Urology

## 2018-02-27 ENCOUNTER — Ambulatory Visit: Payer: Medicare Other | Admitting: Urology

## 2018-03-02 ENCOUNTER — Encounter: Payer: Self-pay | Admitting: Urology

## 2018-03-02 ENCOUNTER — Ambulatory Visit (INDEPENDENT_AMBULATORY_CARE_PROVIDER_SITE_OTHER): Payer: Medicare Other | Admitting: Urology

## 2018-03-02 VITALS — BP 163/100 | HR 106 | Ht 72.0 in | Wt 204.0 lb

## 2018-03-02 DIAGNOSIS — I10 Essential (primary) hypertension: Secondary | ICD-10-CM | POA: Diagnosis not present

## 2018-03-02 DIAGNOSIS — C61 Malignant neoplasm of prostate: Secondary | ICD-10-CM

## 2018-03-02 DIAGNOSIS — R339 Retention of urine, unspecified: Secondary | ICD-10-CM | POA: Diagnosis not present

## 2018-03-02 LAB — URINALYSIS, COMPLETE
Bilirubin, UA: NEGATIVE
Glucose, UA: NEGATIVE
Ketones, UA: NEGATIVE
Leukocytes, UA: NEGATIVE
Nitrite, UA: NEGATIVE
Protein, UA: NEGATIVE
RBC, UA: NEGATIVE
Specific Gravity, UA: >1.03 — ABNORMAL HIGH (ref 1.005–1.030)
Urobilinogen, Ur: 0.2 mg/dL (ref 0.2–1.0)
pH, UA: 6 (ref 5.0–7.5)

## 2018-03-02 LAB — MICROSCOPIC EXAMINATION
Bacteria, UA: NONE SEEN
Epithelial Cells (non renal): NONE SEEN /hpf (ref 0–10)
RBC, UA: NONE SEEN /hpf (ref 0–?)

## 2018-03-02 LAB — BLADDER SCAN AMB NON-IMAGING

## 2018-03-02 NOTE — Progress Notes (Signed)
1:31 PM  03/03/18  Samuel Shelton 1951/03/03 [id number]  Referring provider: Sofie Hartigan, MD [place],  [id number]  Chief Complaint  Patient presents with  . Prostate Cancer    40month    HPI: 67 yo M With a history of low risk prostate cancer diagnosed in 2016 on surveillance to return today for routine follow-up.   He initially presented for evaluation of an elevated PSA to 5.36 ng/dL in Fall 2015 . He also had a 4K study which indicated 43% risk of high grade malignancy if DRE is positive or 26% if DRE is negative. Unfortunately, Mr. 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  In the interim, his PSA rose to rose to 7.4 ng/dL on 05/20/15.  He was taken to the OR on 05/2015 at which time DRE in the OR showed no evidence of nodules, enlarged gland.  Pathology consistent wit Gleason 3+3 involving 3/13 cores up to 30% on the right.    He returned to the operating room on 09/2016 for repeat confirmatory biopsy.  This showed a total of 3 of 12 cores on the left with Gleason 3+3 involving up to 50% of the tissue.  Exam under anesthesia was unremarkable. Prostate volume measured 35 cc.  His PSA has been relatively stable in the 7- 8 range but recently jumped up to 11.2.   His is stable x 3 months at 11.1 on 01/18/18.     He has had an elevated postvoid residual on multiple previous occasional UTI.  It is difficult to assess his voiding symptoms based on his mental status.  Voiding symptoms are improved after starting flomax.  UA today negative.    PVR elevated again today but slightly improved from previous occations, to 261 cc.    He is accompanied by his health care proxy, [name] today.     PMH: Past Medical History:  Diagnosis Date  . Chronic mental illness   . Edema of both legs   . Elevated PSA   . Elevated PSA   . Hypertension   . Mental retardation   . Phimosis     Surgical History: Past  Surgical History:  Procedure Laterality Date  . PROSTATE BIOPSY N/A 06/22/2015   Procedure: PROSTATE BIOPSY;  Surgeon: Hollice Espy, MD;  Location: [facility] ORS;  Service: Urology;  Laterality: N/A;  . PROSTATE BIOPSY N/A 09/27/2016   Procedure: PROSTATE BIOPSY;  Surgeon: Hollice Espy, MD;  Location: [facility] ORS;  Service: Urology;  Laterality: N/A;    Home Medications:  Allergies as of 03/02/2018   No Known Allergies     Medication List        Accurate as of 03/02/18 11:59 PM. Always use your most recent med list.          hydrochlorothiazide 12.5 MG tablet Commonly known as:  HYDRODIURIL Take 12.5 mg by mouth.   tamsulosin 0.4 MG Caps capsule Commonly known as:  FLOMAX Take 1 capsule (0.4 mg total) by mouth daily.       Allergies: No Known Allergies  Family History: Family History  Problem Relation Age of Onset  . Stroke Mother   . Hypertension Sister   . Diabetes type II Sister     Social History:  reports that he has quit smoking. His smoking use included cigarettes. He smoked 0.50 packs per day. he has never used smokeless tobacco. He reports that he does not drink alcohol or  use drugs.   ROS: UROLOGY Frequent Urination?: No Hard to postpone urination?: No Burning/pain with urination?: No Get up at night to urinate?: No Leakage of urine?: No Urine stream starts and stops?: No Trouble starting stream?: No Do you have to strain to urinate?: No Blood in urine?: No Urinary tract infection?: No Sexually transmitted disease?: No Injury to kidneys or bladder?: No Painful intercourse?: No Weak stream?: No Erection problems?: No Penile pain?: No Gastrointestinal Nausea?: No Vomiting?: No Indigestion/heartburn?: No Diarrhea?: No Constipation?: Yes Constitutional Fever: No Night sweats?: No Weight loss?: Yes Fatigue?: No Skin Skin rash/lesions?: Yes Itching?: No Eyes Blurred vision?: Yes Double vision?: No Ears/Nose/Throat Sore throat?: No Sinus  problems?: Yes Hematologic/Lymphatic Swollen glands?: No Easy bruising?: No Cardiovascular Leg swelling?: No Chest pain?: Yes Respiratory Cough?: Yes Shortness of breath?: No Endocrine Excessive thirst?: Yes Musculoskeletal Back pain?: Yes Joint pain?: No Neurological Headaches?: Yes Dizziness?: No Psychologic Depression?: Yes Anxiety?: Yes   Physical Exam: BP (!) 163/100   Pulse (!) 106   Ht 6' (1.829 m)   Wt 204 lb (92.5 kg)   BMI 27.67 kg/m   Constitutional:  Alert and oriented, No acute distress.  Accompanied by caretaker. HEENT: [place] AT, moist mucus membranes.  Trachea midline, no masses. Cardiovascular: No clubbing, cyanosis, or edema. Respiratory: Normal respiratory effort, no increased work of breathing.   Rectal deferred-unable to tolerate Skin: No rashes, bruises or suspicious lesions. Neurologic: Grossly intact, no focal deficits, moving all 4 extremities. Psychiatric: Insight somewhat limited, answer some questions appropriately but no others. Somewhat tangential.      Laboratory Data: Component     Latest Ref Rng & Units 07/28/2015 01/12/2016 09/08/2016 04/26/2017  Prostate Specific Ag, Serum     0.0 - 4.0 ng/mL 7.6 (H) 8.9 (H) 8.5 (H) 7.2 (H)   Component     Latest Ref Rng & Units 10/25/2017 01/18/2018  Prostate Specific Ag, Serum     0.0 - 4.0 ng/mL 11.2 (H) 11.1 (H)     Urinalysis Results for orders placed or performed in visit on 03/02/18  Microscopic Examination  Result Value Ref Range   WBC, UA 0-5 0 - 5 /hpf   RBC, UA None seen 0 - 2 /hpf   Epithelial Cells (non renal) None seen 0 - 10 /hpf   Mucus, UA Present (A) Not Estab.   Bacteria, UA None seen None seen/Few  Urinalysis, Complete  Result Value Ref Range   Specific Gravity, UA >1.030 (H) 1.005 - 1.030   pH, UA 6.0 5.0 - 7.5   Color, UA Yellow Yellow   Appearance Ur Clear Clear   Leukocytes, UA Negative Negative   Protein, UA Negative Negative/Trace   Glucose, UA Negative Negative     Ketones, UA Negative Negative   RBC, UA Negative Negative   Bilirubin, UA Negative Negative   Urobilinogen, Ur 0.2 0.2 - 1.0 mg/dL   Nitrite, UA Negative Negative   Microscopic Examination See below:   BLADDER SCAN AMB NON-IMAGING  Result Value Ref Range   Scan Result 118ml     UA today reviewed, dip is negative as well as microscopic exam.  No evidence of UTI.  See epic for details.  Assessment & Plan:  67 year old African-American male with chronic mental illness with dx T1c Gleason 3+3 prostate cancer involving 3/12 cores, low volume, 2016.  Confirmatory biopsy 10/217 with stable disease volume and grade.     1. Prostate cancer ([place]) Recent bump in PSA which has stabilized Does  not tolerated rectal exams well in the office Suspect he will have a difficult time tolerating an endorectal MRI, required anesthesia for previous prostate biopsies in the operating room We will continue to follow, if PSA rises again, will plan for intervention versus additional diagnostic workup Patient's healthcare proxy, [name] is agreeable with this plan - PSA - Urinalysis, Complete - BLADDER SCAN AMB NON-IMAGING  2. Incomplete bladder emtpying Post void residual remains elevated today but down from previous Voiding symptoms improved with Flomax  3. Essential Hypertension Asymptomatic This is a chronic issue at each visit Again encouraged follow-up with PCP for this  Return in about 6 months (around 09/02/2018) for PSA (ideally prior)/ PVR/ UA.  Hollice Espy, MD  [facility] [address], [place], [place] [address] [phone]

## 2018-08-08 ENCOUNTER — Other Ambulatory Visit: Payer: Self-pay

## 2018-08-08 ENCOUNTER — Emergency Department
Admission: EM | Admit: 2018-08-08 | Discharge: 2018-08-09 | Disposition: A | Discharge status: Home / Self Care | Payer: Medicare Other | Attending: Emergency Medicine | Admitting: Emergency Medicine

## 2018-08-08 ENCOUNTER — Emergency Department: Payer: Medicare Other

## 2018-08-08 ENCOUNTER — Encounter: Payer: Self-pay | Admitting: Emergency Medicine

## 2018-08-08 DIAGNOSIS — R2689 Other abnormalities of gait and mobility: Secondary | ICD-10-CM | POA: Diagnosis not present

## 2018-08-08 DIAGNOSIS — I10 Essential (primary) hypertension: Secondary | ICD-10-CM | POA: Diagnosis not present

## 2018-08-08 DIAGNOSIS — Z87891 Personal history of nicotine dependence: Secondary | ICD-10-CM | POA: Diagnosis not present

## 2018-08-08 DIAGNOSIS — R410 Disorientation, unspecified: Secondary | ICD-10-CM | POA: Diagnosis not present

## 2018-08-08 DIAGNOSIS — W19XXXA Unspecified fall, initial encounter: Secondary | ICD-10-CM

## 2018-08-08 LAB — BASIC METABOLIC PANEL
Anion gap: 6 (ref 5–15)
BUN: 10 mg/dL (ref 8–23)
CO2: 27 mmol/L (ref 22–32)
Calcium: 8.7 mg/dL — ABNORMAL LOW (ref 8.9–10.3)
Chloride: 112 mmol/L — ABNORMAL HIGH (ref 98–111)
Creatinine, Ser: 0.96 mg/dL (ref 0.61–1.24)
GFR calc Af Amer: >60 mL/min (ref >60)
GFR calc non Af Amer: >60 mL/min (ref >60)
Glucose, Bld: 102 mg/dL — ABNORMAL HIGH (ref 70–99)
Potassium: 3.9 mmol/L (ref 3.5–5.1)
Sodium: 145 mmol/L (ref 135–145)

## 2018-08-08 LAB — CBC
HCT: 41.3 % (ref 40.0–52.0)
Hemoglobin: 14.2 g/dL (ref 13.0–18.0)
MCH: 29 pg (ref 26.0–34.0)
MCHC: 34.3 g/dL (ref 32.0–36.0)
MCV: 84.5 fL (ref 80.0–100.0)
Platelets: 233 10*3/uL (ref 150–440)
RBC: 4.89 MIL/uL (ref 4.40–5.90)
RDW: 15.1 % — ABNORMAL HIGH (ref 11.5–14.5)
WBC: 4.3 10*3/uL (ref 3.8–10.6)

## 2018-08-08 NOTE — ED Triage Notes (Signed)
Pt presents to ED after he fell while attempting to stand two times yesterday and today. Pt family states when she initially saw him stand he was leaning to the right and then witnessed his fall. Pt not able to answer questions correctly at baseline. Pt does deny pain and states he did not fall.

## 2018-08-08 NOTE — ED Provider Notes (Signed)
[facility] Emergency Department Provider Note   ____________________________________________   I have reviewed the triage vital signs and the nursing notes.   HISTORY  Chief Complaint Fall   History limited by and level 5 caveat due to confusion/baseline mentation  HPI Samuel Shelton is a 67 y.o. male who presents to the emergency department today because of family concern for two falls. The patient has had two falls over the past two days. The patient apparently was seen getting up and leaning to his right. The family states however that other times he has been able to ambulate more normally however a little slower than normal. They have noticed some increased confusion. They have not noticed any fever, cough, shortness of breath.   Per medical record review patient has a history of mental retardation, chronic mental illness.  Past Medical History:  Diagnosis Date  . Chronic mental illness   . Edema of both legs   . Elevated PSA   . Elevated PSA   . Hypertension   . Mental retardation   . Phimosis     Patient Active Problem List   Diagnosis Date Noted  . Edema leg 08/20/2014  . Chronic mental disorder 08/20/2014  . BP (high blood pressure) 08/20/2014  . Intellectual disability 08/20/2014    Past Surgical History:  Procedure Laterality Date  . PROSTATE BIOPSY N/A 06/22/2015   Procedure: PROSTATE BIOPSY;  Surgeon: Hollice Espy, MD;  Location: [facility] ORS;  Service: Urology;  Laterality: N/A;  . PROSTATE BIOPSY N/A 09/27/2016   Procedure: PROSTATE BIOPSY;  Surgeon: Hollice Espy, MD;  Location: [facility] ORS;  Service: Urology;  Laterality: N/A;    Prior to Admission medications   Medication Sig Start Date End Date Taking? Authorizing Provider  hydrochlorothiazide (HYDRODIURIL) 12.5 MG tablet Take 12.5 mg by mouth.  03/02/15   [provider]  tamsulosin (FLOMAX) 0.4 MG CAPS capsule Take 1 capsule (0.4 mg total) by mouth daily. 10/27/17    Hollice Espy, MD    Allergies Patient has no known allergies.  Family History  Problem Relation Age of Onset  . Stroke Mother   . Hypertension Sister   . Diabetes type II Sister     Social History Social History   Tobacco Use  . Smoking status: Former Smoker    Packs/day: 0.50    Types: Cigarettes  . Smokeless tobacco: Never Used  Substance Use Topics  . Alcohol use: No    Alcohol/week: 0.0 standard drinks  . Drug use: No    Review of Systems Unable to obtain secondary to mental status.   ____________________________________________   PHYSICAL EXAM:  VITAL SIGNS: ED Triage Vitals  Enc Vitals Group     BP 08/08/18 2201 (!) 206/130     Pulse Rate 08/08/18 2201 87     Resp 08/08/18 2201 18     Temp 08/08/18 2201 98.9 F (37.2 C)     Temp Source 08/08/18 2201 Oral     SpO2 08/08/18 2201 98 %     Weight 08/08/18 2215 209 lb (94.8 kg)     Height 08/08/18 2215 6\' 2"  (1.88 m)     Head Circumference --      Peak Flow --      Pain Score 08/08/18 2215 0   Constitutional: Awake and alert. Not oriented. Eyes: Conjunctivae are normal.  ENT      Head: Normocephalic and atraumatic.      Nose: No congestion/rhinnorhea.      Mouth/Throat:  Mucous membranes are moist.      Neck: No stridor. Hematological/Lymphatic/Immunilogical: No cervical lymphadenopathy. Cardiovascular: Normal rate, regular rhythm.  No murmurs, rubs, or gallops.  Respiratory: Normal respiratory effort without tachypnea nor retractions. Breath sounds are clear and equal bilaterally. No wheezes/rales/rhonchi. Gastrointestinal: Soft and non tender. No rebound. No guarding.  Genitourinary: Deferred Musculoskeletal: Normal range of motion in all extremities. No lower extremity edema. Neurologic:  Not oriented. No gross focal neurologic deficits are appreciated.  Skin:  Skin is warm, dry and intact. No rash noted. Psychiatric: Mood and affect are normal. Speech and behavior are normal. Patient exhibits  appropriate insight and judgment.  ____________________________________________    LABS (pertinent positives/negatives)  CBC wbc 4.3, hgb 14.2, plt 233 BMP na 145, k 3.9, cl 112, cr 0.96 UA clear, wbc 0-5 ____________________________________________   EKG  I, Nance Pear, attending physician, personally viewed and interpreted this EKG  EKG Time: 2237 Rate: 83 Rhythm: normal sinus rhythm Axis: normal Intervals: qtc 443 QRS: narrow, q waves v1 ST changes: no st elevation Impression: abnormal ekg   ____________________________________________    RADIOLOGY  CT head No acute abnormality  ____________________________________________   PROCEDURES  Procedures  ____________________________________________   INITIAL IMPRESSION / ASSESSMENT AND PLAN / ED COURSE  Pertinent labs & imaging results that were available during my care of the patient were reviewed by me and considered in my medical decision making (see chart for details).   Patient presented to the emergency department today because of concerns for 2 falls.  The patient.  Blood work and urine without concerning findings.  Patient was able to ambulate easily here.  At this point I doubt stroke given that the symptoms are intermittent.  Unclear etiology of his imbalance on those 2 occasions.  Will discharge.  Discussed findings with patient and family.  ____________________________________________   FINAL CLINICAL IMPRESSION(S) / ED DIAGNOSES  Final diagnoses:  Fall, initial encounter  Confusion     Note: This dictation was prepared with Dragon dictation. Any transcriptional errors that result from this process are unintentional     Nance Pear, MD 08/09/18 [phone]

## 2018-08-09 DIAGNOSIS — R2689 Other abnormalities of gait and mobility: Secondary | ICD-10-CM | POA: Diagnosis not present

## 2018-08-09 LAB — URINALYSIS, COMPLETE (UACMP) WITH MICROSCOPIC
Bacteria, UA: NONE SEEN
Bilirubin Urine: NEGATIVE
Glucose, UA: NEGATIVE mg/dL
Hgb urine dipstick: NEGATIVE
Ketones, ur: NEGATIVE mg/dL
Leukocytes, UA: NEGATIVE
Nitrite: NEGATIVE
Protein, ur: NEGATIVE mg/dL
Specific Gravity, Urine: 1.018 (ref 1.005–1.030)
pH: 7 (ref 5.0–8.0)

## 2018-08-09 LAB — TROPONIN I: Troponin I: <0.03 ng/mL (ref <0.03)

## 2018-08-09 NOTE — Discharge Instructions (Addendum)
Please seek medical attention for any high fevers, chest pain, shortness of breath, change in behavior, persistent vomiting, bloody stool or any other new or concerning symptoms.  

## 2018-08-29 ENCOUNTER — Other Ambulatory Visit: Payer: Self-pay

## 2018-08-29 DIAGNOSIS — C61 Malignant neoplasm of prostate: Secondary | ICD-10-CM

## 2018-09-03 ENCOUNTER — Encounter: Payer: Self-pay | Admitting: Urology

## 2018-09-03 ENCOUNTER — Other Ambulatory Visit: Payer: Medicare Other

## 2018-09-04 ENCOUNTER — Other Ambulatory Visit: Payer: Medicare Other

## 2018-09-05 ENCOUNTER — Ambulatory Visit (INDEPENDENT_AMBULATORY_CARE_PROVIDER_SITE_OTHER): Payer: Medicare Other | Admitting: Urology

## 2018-09-05 ENCOUNTER — Encounter: Payer: Self-pay | Admitting: Urology

## 2018-09-05 VITALS — BP 180/107 | HR 100 | Wt 200.0 lb

## 2018-09-05 DIAGNOSIS — C61 Malignant neoplasm of prostate: Secondary | ICD-10-CM | POA: Diagnosis not present

## 2018-09-05 DIAGNOSIS — R339 Retention of urine, unspecified: Secondary | ICD-10-CM | POA: Diagnosis not present

## 2018-09-05 LAB — BLADDER SCAN AMB NON-IMAGING

## 2018-09-05 MED ORDER — FINASTERIDE 5 MG PO TABS: 5.0000 mg | ORAL_TABLET | Freq: Every day | ORAL | 11 refills | Status: DC

## 2018-09-05 NOTE — Progress Notes (Signed)
09/05/2018 8:23 PM   Samuel Shelton 1951-11-05 [id number]  Referring provider: Sofie Hartigan, MD Samuel [place], [place] [address]  Chief Complaint  Patient presents with  . Prostate Cancer    1month    HPI: 67 yo M With a history of low risk prostate cancer diagnosed in 2016 on surveillance to return today for routine follow-up.  Since last visit, he was seen in the emergency room for a fall.  In addition, he continues to have fecal and urinary incontinence which his cousin believes is primarily behavioral.  She is looking for group homes or facilities at this point as he is becoming difficult to care for at home.   He initially presented for evaluation of an elevated PSA to 5.36 ng/dL in Fall 2015 . He also had a 4K study which indicated 43% risk of high grade malignancy if DRE is positive or 26% if DRE is negative. Unfortunately, Samuel Shelton is mentally handicapped and unable to tolerate a DRE/ biopsy in the office.  In the interim, his PSA rose to rose to 7.4 ng/dL on 05/20/15.  He was taken to the OR on 05/2015 at which time DRE in the OR showed no evidence of nodules, enlarged gland.  Pathology consistent wit Gleason 3+3 involving 3/13 cores up to 30% on the right.    He returned to the operating room on 09/2016 for repeat confirmatory biopsy.  This showed a total of 3 of 12 cores on the left with Gleason 3+3 involving up to 50% of the tissue.  Exam under anesthesia was unremarkable. Prostate volume measured 35 cc.  His PSA has been relatively stable in the 7- 8 range but recently jumped up to 11.2.   His is stable x 3 months at 11.1 on 01/18/18.   Repeat labs today are pending.    He has had an elevated postvoid residual on multiple previous occasional UTI.  It is difficult to assess his voiding symptoms based on his mental status.  Voiding symptoms are improved after starting flomax.   PVR has been elevated on mutliple occations.  Today, his PVR markedly elevated  365.  He denies any urinary issues.  He continues to take Flomax but his cousin mentions that she sometimes finds the pill around the house or under his fingernail and she is not sure if he is actually taking it on most occasions.  He is accompanied by his health care proxy, Samuel Shelton today.      PMH: Past Medical History:  Diagnosis Date  . Chronic mental illness   . Edema of both legs   . Elevated PSA   . Elevated PSA   . Hypertension   . Mental retardation   . Phimosis     Surgical History: Past Surgical History:  Procedure Laterality Date  . PROSTATE BIOPSY N/A 06/22/2015   Procedure: PROSTATE BIOPSY;  Surgeon: Hollice Espy, MD;  Location: [facility] ORS;  Service: Urology;  Laterality: N/A;  . PROSTATE BIOPSY N/A 09/27/2016   Procedure: PROSTATE BIOPSY;  Surgeon: Hollice Espy, MD;  Location: [facility] ORS;  Service: Urology;  Laterality: N/A;    Home Medications:  Allergies as of 09/05/2018   No Known Allergies     Medication List        Accurate as of 09/05/18  8:23 PM. Always use your most recent med list.          finasteride 5 MG tablet Commonly known as:  PROSCAR Take 1 tablet (5  mg total) by mouth daily.   hydrochlorothiazide 12.5 MG tablet Commonly known as:  HYDRODIURIL Take 12.5 mg by mouth.   tamsulosin 0.4 MG Caps capsule Commonly known as:  FLOMAX Take 1 capsule (0.4 mg total) by mouth daily.       Allergies: No Known Allergies  Family History: Family History  Problem Relation Age of Onset  . Stroke Mother   . Hypertension Sister   . Diabetes type II Sister     Social History:  reports that he has quit smoking. His smoking use included cigarettes. He smoked 0.50 packs per day. He has never used smokeless tobacco. He reports that he does not drink alcohol or use drugs.  ROS: UROLOGY Frequent Urination?: No Hard to postpone urination?: No Burning/pain with urination?: No Get up at night to urinate?: No Leakage of urine?: No Urine  stream starts and stops?: Yes Trouble starting stream?: Yes Do you have to strain to urinate?: Yes Blood in urine?: No Urinary tract infection?: No Sexually transmitted disease?: No Injury to kidneys or bladder?: No Painful intercourse?: No Weak stream?: No Erection problems?: No Penile pain?: No  Gastrointestinal Nausea?: No Vomiting?: No Indigestion/heartburn?: No Diarrhea?: No Constipation?: No  Constitutional Fever: No Night sweats?: No Weight loss?: No Fatigue?: No  Skin Skin rash/lesions?: No Itching?: No  Eyes Blurred vision?: No Double vision?: No  Ears/Nose/Throat Sore throat?: No Sinus problems?: No  Hematologic/Lymphatic Swollen glands?: No Easy bruising?: No  Cardiovascular Leg swelling?: No Chest pain?: No  Respiratory Cough?: No Shortness of breath?: No  Endocrine Excessive thirst?: No  Musculoskeletal Back pain?: No Joint pain?: No  Neurological Headaches?: No Dizziness?: No  Psychologic Depression?: No Anxiety?: No  Physical Exam: BP (!) 180/107   Pulse 100   Wt 200 lb (90.7 kg)   BMI 25.68 kg/m   Constitutional:  Alert and oriented, No acute distress. HEENT: [place] AT, moist mucus membranes.  Trachea midline, no masses. Cardiovascular: No clubbing, cyanosis, or edema. Respiratory: Normal respiratory effort, no increased work of breathing. Skin: No rashes, bruises or suspicious lesions. Neurologic: Grossly intact, no focal deficits, moving all 4 extremities. Psychiatric: Normal mood and affect.  Laboratory Data: Lab Results  Component Value Date   WBC 4.3 08/08/2018   HGB 14.2 08/08/2018   HCT 41.3 08/08/2018   MCV 84.5 08/08/2018   PLT 233 08/08/2018    Lab Results  Component Value Date   CREATININE 0.96 08/08/2018   Urinalysis    Component Value Date/Time   COLORURINE YELLOW (A) 08/09/2018 0216   APPEARANCEUR CLEAR (A) 08/09/2018 0216   APPEARANCEUR Clear 03/02/2018 0937   LABSPEC 1.018 08/09/2018 0216    PHURINE 7.0 08/09/2018 0216   GLUCOSEU NEGATIVE 08/09/2018 0216   HGBUR NEGATIVE 08/09/2018 0216   BILIRUBINUR NEGATIVE 08/09/2018 0216   BILIRUBINUR Negative 03/02/2018 0937   KETONESUR NEGATIVE 08/09/2018 0216   PROTEINUR NEGATIVE 08/09/2018 0216   NITRITE NEGATIVE 08/09/2018 0216   LEUKOCYTESUR NEGATIVE 08/09/2018 0216   LEUKOCYTESUR Negative 03/02/2018 0937    Lab Results  Component Value Date   LABMICR See below: 03/02/2018   WBCUA 0-5 03/02/2018   RBCUA None seen 03/02/2018   LABEPIT None seen 03/02/2018   MUCUS Present (A) 03/02/2018   BACTERIA NONE SEEN 08/09/2018    Pertinent Imaging: PVR today 365 cc  Assessment & Plan:    1. Prostate cancer ([place]) Low risk prostate cancer with recent rise in PSA Unable to tolerate endorectal MRI All prostate biopsies must be done in  the operating room as well as rectal exams due to intolerance PSA was repeated today and is pending We will continue to follow him with surveillance given his multiple medical comorbidities and complex social situation - PSA  2. Incomplete bladder emptying Postvoid residual today more elevated than on previous occasions Unclear if he is really taking his Flomax He has no complaints, negative urinalysis, normal creatinine thus no sequela from this and is otherwise asymptomatic I have encouraged them to work on compliance with Flomax and will add finasteride in addition to help optimize bladder emptying He is not a good candidate for Foley catheter or clean intermittent catheterization thus will manage expectantly unless he develops other sequela of incomplete emptying or florid urinary retention Patient's healthcare proxy is agreeable this plan   Return in about 6 months (around 03/06/2019), or PSA/ UA/ PVR, for PSA/ PVR/ UA.  Hollice Espy, MD  [facility] [address] [place], [place] [address] [phone]  I spent 25 min with this patient of which  greater than 50% was spent in counseling and coordination of care with the patient.

## 2018-09-06 ENCOUNTER — Telehealth: Payer: Self-pay | Admitting: Family Medicine

## 2018-09-06 LAB — PSA: Prostate Specific Ag, Serum: 11.5 ng/mL — ABNORMAL HIGH (ref 0.0–4.0)

## 2018-09-06 NOTE — Telephone Encounter (Signed)
-----   Message from Hollice Espy, MD sent at 09/06/2018  7:49 AM EDT ----- PSA is relatively stable, we will continue follow closely.

## 2018-09-06 NOTE — Telephone Encounter (Signed)
LMOM for patient to return call.

## 2018-09-06 NOTE — Telephone Encounter (Signed)
Pt returned call and I read message from Carrie. 

## 2019-01-18 ENCOUNTER — Other Ambulatory Visit: Payer: Self-pay | Admitting: Acute Care

## 2019-01-18 DIAGNOSIS — R413 Other amnesia: Secondary | ICD-10-CM

## 2019-01-27 ENCOUNTER — Encounter (HOSPITAL_COMMUNITY): Payer: Self-pay

## 2019-01-27 ENCOUNTER — Inpatient Hospital Stay (HOSPITAL_COMMUNITY)
Admission: EM | Admit: 2019-01-27 | Discharge: 2019-01-31 | DRG: 062 | Disposition: A | Discharge status: Skilled Nursing Facility | Payer: Medicare Other | Source: Skilled Nursing Facility | Attending: Neurology | Admitting: Neurology

## 2019-01-27 ENCOUNTER — Emergency Department (HOSPITAL_COMMUNITY): Payer: Medicare Other

## 2019-01-27 DIAGNOSIS — Z87891 Personal history of nicotine dependence: Secondary | ICD-10-CM

## 2019-01-27 DIAGNOSIS — Y92099 Unspecified place in other non-institutional residence as the place of occurrence of the external cause: Secondary | ICD-10-CM

## 2019-01-27 DIAGNOSIS — F99 Mental disorder, not otherwise specified: Secondary | ICD-10-CM | POA: Diagnosis present

## 2019-01-27 DIAGNOSIS — R471 Dysarthria and anarthria: Secondary | ICD-10-CM | POA: Diagnosis present

## 2019-01-27 DIAGNOSIS — R29714 NIHSS score 14: Secondary | ICD-10-CM | POA: Diagnosis present

## 2019-01-27 DIAGNOSIS — I719 Aortic aneurysm of unspecified site, without rupture: Secondary | ICD-10-CM | POA: Diagnosis present

## 2019-01-27 DIAGNOSIS — Z823 Family history of stroke: Secondary | ICD-10-CM

## 2019-01-27 DIAGNOSIS — Z833 Family history of diabetes mellitus: Secondary | ICD-10-CM

## 2019-01-27 DIAGNOSIS — I10 Essential (primary) hypertension: Secondary | ICD-10-CM | POA: Diagnosis present

## 2019-01-27 DIAGNOSIS — X58XXXA Exposure to other specified factors, initial encounter: Secondary | ICD-10-CM | POA: Diagnosis present

## 2019-01-27 DIAGNOSIS — I6389 Other cerebral infarction: Principal | ICD-10-CM | POA: Diagnosis present

## 2019-01-27 DIAGNOSIS — Z8249 Family history of ischemic heart disease and other diseases of the circulatory system: Secondary | ICD-10-CM

## 2019-01-27 DIAGNOSIS — N400 Enlarged prostate without lower urinary tract symptoms: Secondary | ICD-10-CM | POA: Diagnosis present

## 2019-01-27 DIAGNOSIS — Z79899 Other long term (current) drug therapy: Secondary | ICD-10-CM

## 2019-01-27 DIAGNOSIS — E875 Hyperkalemia: Secondary | ICD-10-CM | POA: Diagnosis not present

## 2019-01-27 DIAGNOSIS — I340 Nonrheumatic mitral (valve) insufficiency: Secondary | ICD-10-CM | POA: Diagnosis present

## 2019-01-27 DIAGNOSIS — F489 Nonpsychotic mental disorder, unspecified: Secondary | ICD-10-CM

## 2019-01-27 DIAGNOSIS — G8191 Hemiplegia, unspecified affecting right dominant side: Secondary | ICD-10-CM | POA: Diagnosis present

## 2019-01-27 DIAGNOSIS — I119 Hypertensive heart disease without heart failure: Secondary | ICD-10-CM | POA: Diagnosis present

## 2019-01-27 DIAGNOSIS — I639 Cerebral infarction, unspecified: Secondary | ICD-10-CM

## 2019-01-27 DIAGNOSIS — G9340 Encephalopathy, unspecified: Secondary | ICD-10-CM | POA: Diagnosis present

## 2019-01-27 DIAGNOSIS — R29810 Facial weakness: Secondary | ICD-10-CM | POA: Diagnosis present

## 2019-01-27 DIAGNOSIS — S0083XA Contusion of other part of head, initial encounter: Secondary | ICD-10-CM | POA: Diagnosis present

## 2019-01-27 DIAGNOSIS — E785 Hyperlipidemia, unspecified: Secondary | ICD-10-CM | POA: Diagnosis present

## 2019-01-27 DIAGNOSIS — F79 Unspecified intellectual disabilities: Secondary | ICD-10-CM | POA: Diagnosis present

## 2019-01-27 DIAGNOSIS — R531 Weakness: Secondary | ICD-10-CM | POA: Diagnosis not present

## 2019-01-27 LAB — DIFFERENTIAL
Abs Immature Granulocytes: 0.02 10*3/uL (ref 0.00–0.07)
Basophils Absolute: 0 10*3/uL (ref 0.0–0.1)
Basophils Relative: 1 %
Eosinophils Absolute: 0.1 10*3/uL (ref 0.0–0.5)
Eosinophils Relative: 2 %
Immature Granulocytes: 0 %
Lymphocytes Relative: 41 %
Lymphs Abs: 1.8 10*3/uL (ref 0.7–4.0)
Monocytes Absolute: 0.5 10*3/uL (ref 0.1–1.0)
Monocytes Relative: 10 %
Neutro Abs: 2.1 10*3/uL (ref 1.7–7.7)
Neutrophils Relative %: 46 %

## 2019-01-27 LAB — CBC
HCT: 38.8 % — ABNORMAL LOW (ref 39.0–52.0)
Hemoglobin: 12.6 g/dL — ABNORMAL LOW (ref 13.0–17.0)
MCH: 28.6 pg (ref 26.0–34.0)
MCHC: 32.5 g/dL (ref 30.0–36.0)
MCV: 88 fL (ref 80.0–100.0)
Platelets: 202 10*3/uL (ref 150–400)
RBC: 4.41 MIL/uL (ref 4.22–5.81)
RDW: 13.7 % (ref 11.5–15.5)
WBC: 4.5 10*3/uL (ref 4.0–10.5)
nRBC: 0 % (ref 0.0–0.2)

## 2019-01-27 LAB — I-STAT TROPONIN, ED: Troponin i, poc: 0 ng/mL (ref 0.00–0.08)

## 2019-01-27 LAB — CBG MONITORING, ED: Glucose-Capillary: 87 mg/dL (ref 70–99)

## 2019-01-27 MED ORDER — ALTEPLASE (STROKE) FULL DOSE INFUSION
0.9000 mg/kg | Freq: Once | INTRAVENOUS | Status: AC
  Administered 2019-01-28: 79.7 mg via INTRAVENOUS
  Filled 2019-01-27: qty 100

## 2019-01-27 MED ORDER — SODIUM CHLORIDE 0.9 % IV SOLN: 50.0000 mL | Freq: Once | INTRAVENOUS | Status: DC

## 2019-01-27 NOTE — ED Triage Notes (Signed)
Pt comes via [facility] from [place], LSN 2245 while at an activity at the nursing home. Pt then developed a R sided facial droop and flaccid R side, pt now having some movement of the R side. Not answering questions, hx of dementia and MR.

## 2019-01-27 NOTE — ED Provider Notes (Signed)
TIME SEEN: 11:42 PM  CHIEF COMPLAINT: Code stroke  HPI: Patient is a 68 year old male with history of mental retardation, hypertension who presents to the emergency department with EMS as a code stroke.  Patient came from [facility].  Last seen normal was 10:45 PM.  Staff noticed that he had right-sided weakness just prior to arrival.  Patient unable to move the right side at all.  EMS also noticed right-sided facial droop.  Blood pressure 150s/100s.  Blood glucose here 87.  Patient does have a hematoma to his forehead.  Patient unable to provide much history and staff did not witness a fall.  He arrives in a cervical collar.  ROS: Level 5 caveat for mental retardation  PAST MEDICAL HISTORY/PAST SURGICAL HISTORY:  Past Medical History:  Diagnosis Date  . Chronic mental illness   . Edema of both legs   . Elevated PSA   . Elevated PSA   . Hypertension   . Mental retardation   . Phimosis     MEDICATIONS:  Prior to Admission medications   Medication Sig Start Date End Date Taking? Authorizing Provider  finasteride (PROSCAR) 5 MG tablet Take 1 tablet (5 mg total) by mouth daily. 09/05/18   Hollice Espy, MD  hydrochlorothiazide (HYDRODIURIL) 12.5 MG tablet Take 12.5 mg by mouth.  03/02/15   [provider]  tamsulosin (FLOMAX) 0.4 MG CAPS capsule Take 1 capsule (0.4 mg total) by mouth daily. 10/27/17   Hollice Espy, MD    ALLERGIES:  No Known Allergies  SOCIAL HISTORY:  Social History   Tobacco Use  . Smoking status: Former Smoker    Packs/day: 0.50    Types: Cigarettes  . Smokeless tobacco: Never Used  Substance Use Topics  . Alcohol use: No    Alcohol/week: 0.0 standard drinks    FAMILY HISTORY: Family History  Problem Relation Age of Onset  . Stroke Mother   . Hypertension Sister   . Diabetes type II Sister     EXAM: BP (!) 168/104   Pulse (!) 56   Temp (!) 96 F (35.6 C)   Resp 15   Ht 6\' 2"  (1.88 m)   Wt 88.6 kg   SpO2 99%   BMI  25.08 kg/m  CONSTITUTIONAL: Alert and oriented and responds appropriately to questions.  Chronically ill-appearing, appears older than stated age HEAD: Normocephalic, hematoma noted to the right forehead EYES: Conjunctivae clear, pupils appear equal, EOMI ENT: normal nose; moist mucous membranes NECK: Supple, no meningismus, no nuchal rigidity, no LAD; patient in cervical collar line spinal tenderness or step-off or deformity CARD: RRR; S1 and S2 appreciated; no murmurs, no clicks, no rubs, no gallops RESP: Normal chest excursion without splinting or tachypnea; breath sounds clear and equal bilaterally; no wheezes, no rhonchi, no rales, no hypoxia or respiratory distress, speaking full sentences ABD/GI: Normal bowel sounds; non-distended; soft, non-tender, no rebound, no guarding, no peritoneal signs, no hepatosplenomegaly BACK:  The back appears normal and is non-tender to palpation, there is no CVA tenderness, no midline spinal tenderness or step-off or deformity EXT: Normal ROM in all joints; non-tender to palpation; no edema; normal capillary refill; no cyanosis, no calf tenderness or swelling    SKIN: Normal color for age and race; warm; no rash NEURO: Patient not moving right arm or leg, normal movement of the left arm and leg.  He has a right-sided facial droop.  Unable to test sensation.  Can answer some questions but does not talk more  than 1 word at a time.  No obvious dysarthria. PSYCH: The patient's mood and manner are appropriate. Grooming and personal hygiene are appropriate.  MEDICAL DECISION MAKING: Patient here as a code stroke.  Patient presents with right-sided deficits.  Neurology, Dr. Lorraine Lax, has seen patient on arrival.  Blood pressure in the 829 systolic with EMS.  Blood glucose normal.  Head CT here shows no intracranial hemorrhage.  Patient will be given TPA for stroke and admitted to the ICU.     I reviewed all nursing notes, vitals, pertinent previous records, EKGs, lab  and urine results, imaging (as available).    EKG Interpretation  Date/Time:  Monday January 28 2019 00:23:18 EST Ventricular Rate:  70 PR Interval:    QRS Duration: 111 QT Interval:  422 QTC Calculation: 456 R Axis:   40 Text Interpretation:  Sinus rhythm Borderline T wave abnormalities No significant change since last tracing Confirmed by Alem Fahl, Cyril Mourning [phone]) on 01/28/2019 12:28:22 AM        CRITICAL CARE Performed by: Cyril Mourning Naquisha Whitehair   Total critical care time: 40 minutes  Critical care time was exclusive of separately billable procedures and treating other patients.  Critical care was necessary to treat or prevent imminent or life-threatening deterioration.  Critical care was time spent personally by me on the following activities: development of treatment plan with patient and/or surrogate as well as nursing, discussions with consultants, evaluation of patient's response to treatment, examination of patient, obtaining history from patient or surrogate, ordering and performing treatments and interventions, ordering and review of laboratory studies, ordering and review of radiographic studies, pulse oximetry and re-evaluation of patient's condition.    Tarita Deshmukh, Delice Bison, DO 01/28/19 [phone]

## 2019-01-28 ENCOUNTER — Emergency Department (HOSPITAL_COMMUNITY): Payer: Medicare Other

## 2019-01-28 ENCOUNTER — Ambulatory Visit: Admission: RE | Admit: 2019-01-28 | Payer: Medicare Other | Source: Ambulatory Visit

## 2019-01-28 ENCOUNTER — Inpatient Hospital Stay (HOSPITAL_COMMUNITY): Payer: Medicare Other

## 2019-01-28 ENCOUNTER — Other Ambulatory Visit (HOSPITAL_COMMUNITY): Payer: Self-pay | Admitting: Radiology

## 2019-01-28 ENCOUNTER — Other Ambulatory Visit: Payer: Self-pay

## 2019-01-28 DIAGNOSIS — N400 Enlarged prostate without lower urinary tract symptoms: Secondary | ICD-10-CM | POA: Diagnosis present

## 2019-01-28 DIAGNOSIS — Y92099 Unspecified place in other non-institutional residence as the place of occurrence of the external cause: Secondary | ICD-10-CM | POA: Diagnosis not present

## 2019-01-28 DIAGNOSIS — E785 Hyperlipidemia, unspecified: Secondary | ICD-10-CM | POA: Diagnosis present

## 2019-01-28 DIAGNOSIS — I119 Hypertensive heart disease without heart failure: Secondary | ICD-10-CM | POA: Diagnosis present

## 2019-01-28 DIAGNOSIS — X58XXXA Exposure to other specified factors, initial encounter: Secondary | ICD-10-CM | POA: Diagnosis present

## 2019-01-28 DIAGNOSIS — I639 Cerebral infarction, unspecified: Secondary | ICD-10-CM

## 2019-01-28 DIAGNOSIS — Z87891 Personal history of nicotine dependence: Secondary | ICD-10-CM | POA: Diagnosis not present

## 2019-01-28 DIAGNOSIS — Z79899 Other long term (current) drug therapy: Secondary | ICD-10-CM | POA: Diagnosis not present

## 2019-01-28 DIAGNOSIS — Z823 Family history of stroke: Secondary | ICD-10-CM | POA: Diagnosis not present

## 2019-01-28 DIAGNOSIS — I340 Nonrheumatic mitral (valve) insufficiency: Secondary | ICD-10-CM | POA: Diagnosis present

## 2019-01-28 DIAGNOSIS — E875 Hyperkalemia: Secondary | ICD-10-CM | POA: Diagnosis not present

## 2019-01-28 DIAGNOSIS — S0083XA Contusion of other part of head, initial encounter: Secondary | ICD-10-CM | POA: Diagnosis present

## 2019-01-28 DIAGNOSIS — I6300 Cerebral infarction due to thrombosis of unspecified precerebral artery: Secondary | ICD-10-CM | POA: Diagnosis not present

## 2019-01-28 DIAGNOSIS — G8191 Hemiplegia, unspecified affecting right dominant side: Secondary | ICD-10-CM | POA: Diagnosis present

## 2019-01-28 DIAGNOSIS — R29714 NIHSS score 14: Secondary | ICD-10-CM | POA: Diagnosis present

## 2019-01-28 DIAGNOSIS — R471 Dysarthria and anarthria: Secondary | ICD-10-CM | POA: Diagnosis present

## 2019-01-28 DIAGNOSIS — F79 Unspecified intellectual disabilities: Secondary | ICD-10-CM | POA: Diagnosis present

## 2019-01-28 DIAGNOSIS — R531 Weakness: Secondary | ICD-10-CM | POA: Diagnosis present

## 2019-01-28 DIAGNOSIS — R29810 Facial weakness: Secondary | ICD-10-CM | POA: Diagnosis present

## 2019-01-28 DIAGNOSIS — G9340 Encephalopathy, unspecified: Secondary | ICD-10-CM | POA: Diagnosis present

## 2019-01-28 DIAGNOSIS — I6389 Other cerebral infarction: Secondary | ICD-10-CM | POA: Diagnosis present

## 2019-01-28 DIAGNOSIS — Z8249 Family history of ischemic heart disease and other diseases of the circulatory system: Secondary | ICD-10-CM | POA: Diagnosis not present

## 2019-01-28 DIAGNOSIS — Z833 Family history of diabetes mellitus: Secondary | ICD-10-CM | POA: Diagnosis not present

## 2019-01-28 DIAGNOSIS — I719 Aortic aneurysm of unspecified site, without rupture: Secondary | ICD-10-CM | POA: Diagnosis present

## 2019-01-28 LAB — COMPREHENSIVE METABOLIC PANEL
ALT: 12 U/L (ref 0–44)
AST: 15 U/L (ref 15–41)
Albumin: 3.2 g/dL — ABNORMAL LOW (ref 3.5–5.0)
Alkaline Phosphatase: 38 U/L (ref 38–126)
Anion gap: 10 (ref 5–15)
BUN: 18 mg/dL (ref 8–23)
CO2: 24 mmol/L (ref 22–32)
Calcium: 8.9 mg/dL (ref 8.9–10.3)
Chloride: 102 mmol/L (ref 98–111)
Creatinine, Ser: 1.18 mg/dL (ref 0.61–1.24)
GFR calc Af Amer: >60 mL/min (ref >60)
GFR calc non Af Amer: >60 mL/min (ref >60)
Glucose, Bld: 96 mg/dL (ref 70–99)
Potassium: 3.3 mmol/L — ABNORMAL LOW (ref 3.5–5.1)
Sodium: 136 mmol/L (ref 135–145)
Total Bilirubin: 0.6 mg/dL (ref 0.3–1.2)
Total Protein: 6.3 g/dL — ABNORMAL LOW (ref 6.5–8.1)

## 2019-01-28 LAB — LIPID PANEL
Cholesterol: 160 mg/dL (ref 0–200)
HDL: 53 mg/dL (ref >40)
LDL Cholesterol: 96 mg/dL (ref 0–99)
Total CHOL/HDL Ratio: 3 RATIO
Triglycerides: 54 mg/dL (ref <150)
VLDL: 11 mg/dL (ref 0–40)

## 2019-01-28 LAB — PROTIME-INR
INR: 1.06
Prothrombin Time: 13.7 seconds (ref 11.4–15.2)

## 2019-01-28 LAB — ETHANOL: Alcohol, Ethyl (B): <10 mg/dL (ref <10)

## 2019-01-28 LAB — HIV ANTIBODY (ROUTINE TESTING W REFLEX): HIV Screen 4th Generation wRfx: NONREACTIVE

## 2019-01-28 LAB — APTT: aPTT: 28 seconds (ref 24–36)

## 2019-01-28 LAB — HEMOGLOBIN A1C
Hgb A1c MFr Bld: 5.6 % (ref 4.8–5.6)
Mean Plasma Glucose: 114.02 mg/dL

## 2019-01-28 LAB — MRSA PCR SCREENING: MRSA by PCR: NEGATIVE

## 2019-01-28 MED ORDER — PANTOPRAZOLE SODIUM 40 MG IV SOLR
40.0000 mg | Freq: Every day | INTRAVENOUS | Status: DC
  Administered 2019-01-28: 40 mg via INTRAVENOUS
  Filled 2019-01-28: qty 40

## 2019-01-28 MED ORDER — ACETAMINOPHEN 650 MG RE SUPP: 650.0000 mg | RECTAL | Status: DC | PRN

## 2019-01-28 MED ORDER — STROKE: EARLY STAGES OF RECOVERY BOOK
Freq: Once | Status: DC
  Filled 2019-01-28: qty 1

## 2019-01-28 MED ORDER — ASPIRIN 81 MG PO CHEW
81.0000 mg | CHEWABLE_TABLET | Freq: Every day | ORAL | Status: DC
  Administered 2019-01-28 – 2019-01-31 (×4): 81 mg via ORAL
  Filled 2019-01-28 (×4): qty 1

## 2019-01-28 MED ORDER — ACETAMINOPHEN 160 MG/5ML PO SOLN: 650.0000 mg | ORAL | Status: DC | PRN

## 2019-01-28 MED ORDER — ACETAMINOPHEN 325 MG PO TABS: 650.0000 mg | ORAL_TABLET | ORAL | Status: DC | PRN

## 2019-01-28 MED ORDER — IOPAMIDOL (ISOVUE-370) INJECTION 76%
100.0000 mL | Freq: Once | INTRAVENOUS | Status: AC | PRN
  Administered 2019-01-28: 100 mL via INTRAVENOUS

## 2019-01-28 MED ORDER — ATORVASTATIN CALCIUM 40 MG PO TABS
40.0000 mg | ORAL_TABLET | Freq: Every day | ORAL | Status: DC
  Administered 2019-01-28 – 2019-01-31 (×4): 40 mg via ORAL
  Filled 2019-01-28 (×4): qty 1

## 2019-01-28 MED ORDER — SODIUM CHLORIDE 0.9 % IV SOLN
INTRAVENOUS | Status: DC
  Administered 2019-01-28 – 2019-01-29 (×3): via INTRAVENOUS

## 2019-01-28 NOTE — Evaluation (Signed)
Occupational Therapy Evaluation Patient Details Name: Samuel Shelton MRN: [id number] DOB: [birth year] Today's Date: 01/28/2019    History of Present Illness Samuel Shelton is an 68 y.o. male past medical history mental retardation lives in assisted living facility, hypertension presents to the ER as a code stroke for right-sided weakness.  tPA given however CT negative for stroke.   Clinical Impression   PTA patient required supervision for self care and mobility, per niece (via PT, who made phone call).  Patient currently admitted for above and limited by problem list below, including: R weakness, impaired balance, initiation/sequencing basic self care tasks, and attention.  Patient currently requires +2 assist for mobility and transfers, total assist for LB ADLs and mod assist for UB ADLs, min assist for grooming seated.  Patient with limited verbalizations throughout session, inconsistent with orientation (birthday and place) but h/o cognitive deficits. Patient will benefit from continued OT services while admitted and after dc at SNF level in order to optimize return to [facility] and independence with ADLs.      Follow Up Recommendations  SNF;Supervision/Assistance - 24 hour    Equipment Recommendations  Other (comment)(TBD at next venue of care)    Recommendations for Other Services       Precautions / Restrictions Precautions Precautions: Fall Precaution Comments: h/o mental retardation Restrictions Weight Bearing Restrictions: No      Mobility Bed Mobility Overal bed mobility: Needs Assistance Bed Mobility: Supine to Sit     Supine to sit: Max assist;+2 for physical assistance;+2 for safety/equipment     General bed mobility comments: requires +2 assist with total assist for B LE mgmt and mod assist to ascend trunk to sitting EOB   Transfers Overall transfer level: Needs assistance Equipment used: 2 person hand held assist Transfers: Sit to/from [employer] Sit to Stand: Max assist;+2 physical assistance;+2 safety/equipment Stand pivot transfers: Max assist;+2 physical assistance;+2 safety/equipment       General transfer comment: max assist to initate and coordinate transfer, noted R lateral lean and extension to trunk with poor awareness and attention to task     Balance Overall balance assessment: Needs assistance Sitting-balance support: No upper extremity supported;Feet supported Sitting balance-Leahy Scale: Fair Sitting balance - Comments: R lateral lean, cueing to correct; requires min to min guard assist staticall y   Standing balance support: No upper extremity supported;Bilateral upper extremity supported Standing balance-Leahy Scale: Poor Standing balance comment: reliant on external support, at times R lateral lean, increased trunk extension and posterior lean                            ADL either performed or assessed with clinical judgement   ADL Overall ADL's : Needs assistance/impaired     Grooming: Wash/dry hands;Wash/dry face;Set up;Sitting Grooming Details (indicate cue type and reason): increased time required  Upper Body Bathing: Minimal assistance;Sitting   Lower Body Bathing: Moderate assistance;Sit to/from stand;+2 for physical assistance;+2 for safety/equipment   Upper Body Dressing : Moderate assistance;Sitting   Lower Body Dressing: Moderate assistance;Sit to/from stand;+2 for physical assistance;+2 for safety/equipment Lower Body Dressing Details (indicate cue type and reason): requires +2 in standing, unable to don/doff socks today  Toilet Transfer: Maximal assistance;+2 for physical assistance;+2 for safety/equipment;Stand-pivot Toilet Transfer Details (indicate cue type and reason): simulated to recliner            General ADL Comments: patient requires increased time and effort for all activities, limited by cognition,  generalized weakness and imapired balance      Vision    Additional Comments: further assessment required      Perception     Praxis      Pertinent Vitals/Pain Pain Assessment: Faces Faces Pain Scale: No hurt     Hand Dominance Right   Extremity/Trunk Assessment Upper Extremity Assessment Upper Extremity Assessment: RUE deficits/detail;LUE deficits/detail RUE Deficits / Details: grossly 3-/5, limited shoulder flexion actively to 90 (PROM funcitonal)  LUE Deficits / Details: grossly 3+/5    Lower Extremity Assessment Lower Extremity Assessment: Defer to PT evaluation       Communication Communication Communication: Expressive difficulties(h/o stutter)   Cognition Arousal/Alertness: Awake/alert Behavior During Therapy: Flat affect Overall Cognitive Status: History of cognitive impairments - at baseline                                 General Comments: pt able to follow simple 1 step commands inconsistenly, oriented to self    General Comments       Exercises     Shoulder Instructions      Home Living Family/patient expects to be discharged to:: Assisted living([facility] )     Type of Home: Assisted living             Bathroom Shower/Tub: Occupational psychologist: Standard                Prior Functioning/Environment Level of Independence: Needs assistance  Gait / Transfers Assistance Needed: ambulated without AD ADL's / Homemaking Assistance Needed: supervision for hygiene   Comments: goes to [employer]        OT Problem List: Decreased strength;Decreased activity tolerance;Impaired balance (sitting and/or standing);Decreased safety awareness;Decreased coordination;Decreased cognition;Decreased knowledge of use of DME or AE;Decreased knowledge of precautions      OT Treatment/Interventions: Self-care/ADL training;Therapeutic exercise;DME and/or AE instruction;Therapeutic activities;Cognitive remediation/compensation;Visual/perceptual  remediation/compensation;Patient/family education;Balance training    OT Goals(Current goals can be found in the care plan section) Acute Rehab OT Goals Patient Stated Goal: none stated Time For Goal Achievement: 02/11/19 Potential to Achieve Goals: Good  OT Frequency: Min 2X/week   Barriers to D/C:            Co-evaluation PT/OT/SLP Co-Evaluation/Treatment: Yes Reason for Co-Treatment: Complexity of the patient's impairments (multi-system involvement) PT goals addressed during session: Mobility/safety with mobility OT goals addressed during session: ADL's and self-care;Other (comment)(mobility)      AM-PAC OT "6 Clicks" Daily Activity     Outcome Measure Help from another person eating meals?: A Lot Help from another person taking care of personal grooming?: A Lot Help from another person toileting, which includes using toliet, bedpan, or urinal?: Total Help from another person bathing (including washing, rinsing, drying)?: A Lot Help from another person to put on and taking off regular upper body clothing?: A Lot Help from another person to put on and taking off regular lower body clothing?: A Lot 6 Click Score: 11   End of Session Equipment Utilized During Treatment: Gait belt Nurse Communication: Mobility status  Activity Tolerance: Patient tolerated treatment well Patient left: in chair;with call bell/phone within reach;with chair alarm set;with nursing/sitter in room  OT Visit Diagnosis: Other abnormalities of gait and mobility (R26.89);Muscle weakness (generalized) (M62.81)                Time: 9937-1696 OT Time Calculation (min): 27 min Charges:  OT General Charges $OT Visit:  1 Visit OT Evaluation $OT Eval Moderate Complexity: [place], [place] Acute Rehabilitation Services Pager [phone] Office [phone]   Delight Stare 01/28/2019, 2:00 PM

## 2019-01-28 NOTE — Progress Notes (Signed)
Pharmacist Code Stroke Response  Notified to mix tPA at 2354 by Dr. Lorraine Lax Delivered tPA to RN at 2357  tPA dose = 8 mg bolus over 1 minute followed by 71.7 mg for a total dose of 79.7 mg over 1 hour   Samuel Shelton 01/28/19 12:00 AM

## 2019-01-28 NOTE — Evaluation (Signed)
Clinical/Bedside Swallow Evaluation Patient Details  Name: Samuel Shelton MRN: [id number] Date of Birth: 03-22-1951  Today's Date: 01/28/2019 Time: SLP Start Time (ACUTE ONLY): 7829 SLP Stop Time (ACUTE ONLY): 0850 SLP Time Calculation (min) (ACUTE ONLY): 12 min  Past Medical History:  Past Medical History:  Diagnosis Date  . Chronic mental illness   . Edema of both legs   . Elevated PSA   . Elevated PSA   . Hypertension   . Mental retardation   . Phimosis    Past Surgical History:  Past Surgical History:  Procedure Laterality Date  . PROSTATE BIOPSY N/A 06/22/2015   Procedure: PROSTATE BIOPSY;  Surgeon: Hollice Espy, MD;  Location: [facility] ORS;  Service: Urology;  Laterality: N/A;  . PROSTATE BIOPSY N/A 09/27/2016   Procedure: PROSTATE BIOPSY;  Surgeon: Hollice Espy, MD;  Location: [facility] ORS;  Service: Urology;  Laterality: N/A;   HPI:  Samuel Shelton is an 68 y.o. male past medical history mental retardation lives in assisted living facility, hypertension presents to the ER as a code stroke for right-sided weakness. 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 Suspect some degree of pharyngeal residue is possible as pt performed subswallows consistently throughout evaluation. Immediate throat clear noted after initial cup sips water which did not occur or other s/s aspiration throughout assessment when challenged. He is endentulous but consistently masticated and transited Dys 3 texture within functional limits over 2 trials without oral residue. Pt able to hold cup with assist and will need full supervision/assist with meals due to visual disturbance and cognitive impairments. Dys 2 (minced-ground), thin (cup/straw), crush meds and FULL supervision recommended with continued ST.  SLP Visit  Diagnosis: Dysphagia, unspecified (R13.10)    Aspiration Risk  Mild aspiration risk;Moderate aspiration risk    Diet Recommendation Dysphagia 2 (Fine chop);Thin liquid   Liquid Administration via: Straw;Cup Medication Administration: Crushed with puree Supervision: Patient able to self feed;Full supervision/cueing for compensatory strategies;Staff to assist with self feeding Compensations: Slow rate;Minimize environmental distractions;Small sips/bites;Lingual sweep for clearance of pocketing Postural Changes: Seated upright at 90 degrees    Other  Recommendations Oral Care Recommendations: Oral care BID   Follow up Recommendations Other (comment)(TBD)      Frequency and Duration min 2x/week  2 weeks       Prognosis Prognosis for Safe Diet Advancement: (fair-good) Barriers to Reach Goals: Cognitive deficits      Swallow Study   General HPI: Samuel Shelton is an 68 y.o. male past medical history mental retardation lives in assisted living facility, hypertension presents to the ER as a code stroke for right-sided weakness. 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 Type of Study: Bedside Swallow Evaluation Previous Swallow Assessment: (none) Diet Prior to this Study: NPO Temperature Spikes Noted: No Respiratory Status: Room air History of Recent Intubation: No Behavior/Cognition: Alert;Cooperative;Pleasant mood;Confused;Requires cueing Oral Cavity Assessment: Within Functional Limits Oral Care Completed by SLP: Yes Oral Cavity - Dentition: Edentulous Vision: Impaired for self-feeding Self-Feeding Abilities: Needs assist;Needs set up Patient Positioning: Upright in bed Baseline Vocal Quality: Normal Volitional Cough: Cognitively unable to elicit Volitional Swallow: Unable to elicit    Oral/Motor/Sensory Function Overall Oral Motor/Sensory Function: Moderate impairment Facial ROM: Reduced right;Suspected CN VII (facial) dysfunction Facial  Symmetry: Abnormal symmetry right;Suspected CN VII (facial) dysfunction Facial Strength: Reduced  right;Suspected CN VII (facial) dysfunction   Ice Chips Ice chips: Not tested   Thin Liquid Thin Liquid: Impaired Presentation: Cup;Straw Oral Phase Impairments: Reduced labial seal Oral Phase Functional Implications: Right anterior spillage Pharyngeal  Phase Impairments: Throat Clearing - Immediate    Nectar Thick Nectar Thick Liquid: Not tested   Honey Thick Honey Thick Liquid: Not tested   Puree Puree: Impaired Pharyngeal Phase Impairments: Multiple swallows   Solid     Solid: Within functional limits      Houston Siren 01/28/2019,9:23 AM  Orbie Pyo Colvin Caroli.Ed Risk analyst [phone] Office [phone]

## 2019-01-28 NOTE — Progress Notes (Addendum)
STROKE TEAM PROGRESS NOTE   INTERVAL HISTORY No family is at the bedside.  Discussed with RN and therapists.  Blood pressure is adequately controlled.blood pressure is adequately controlled.  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 Did pass a swallow eval and was cleared for dysphagia 2 diet  Vitals:   01/28/19 0630 01/28/19 0700 01/28/19 0730 01/28/19 0800  BP: (!) 146/98 (!) 159/94 (!) 144/91 (!) 146/98  Pulse: (!) 56 (!) 57 (!) 57 (!) 55  Resp:    11  Temp:      TempSrc:      SpO2: 99% 97% 98% 100%  Weight:      Height:        CBC:  Recent Labs  Lab 01/27/19 2345  WBC 4.5  NEUTROABS 2.1  HGB 12.6*  HCT 38.8*  MCV 88.0  PLT 035    Basic Metabolic Panel:  Recent Labs  Lab 01/27/19 2345  NA 136  K 3.3*  CL 102  CO2 24  GLUCOSE 96  BUN 18  CREATININE 1.18  CALCIUM 8.9   Lipid Panel:     Component Value Date/Time   CHOL 160 01/28/2019 0155   TRIG 54 01/28/2019 0155   HDL 53 01/28/2019 0155   CHOLHDL 3.0 01/28/2019 0155   VLDL 11 01/28/2019 0155   LDLCALC 96 01/28/2019 0155   HgbA1c:  Lab Results  Component Value Date   HGBA1C 5.6 01/28/2019   Urine Drug Screen: No results found for: LABOPIA, COCAINSCRNUR, LABBENZ, AMPHETMU, THCU, LABBARB  Alcohol Level     Component Value Date/Time   ETH <10 01/27/2019 2345    IMAGING Ct Angio Head W Or Wo Contrast  Result Date: 01/28/2019 CLINICAL DATA:  RIGHT facial droop. History of hypertension and mental retardation. EXAM: CT ANGIOGRAPHY HEAD AND NECK TECHNIQUE: Multidetector CT imaging of the head and neck was performed using the standard protocol during bolus administration of intravenous contrast. Multiplanar CT image reconstructions and MIPs were obtained to evaluate the vascular anatomy. Carotid stenosis measurements (when applicable) are obtained utilizing NASCET criteria, using the distal internal  carotid diameter as the denominator. CONTRAST:  147mL ISOVUE-370 IOPAMIDOL (ISOVUE-370) INJECTION 76% COMPARISON:  CT HEAD January 27, 2019 FINDINGS: CTA NECK FINDINGS: AORTIC ARCH: 4.2 cm ascending aorta. Mild calcific atherosclerosis aortic arch. Two vessel origin is a normal variant. The origins of the innominate, left Common carotid artery and subclavian artery are patent. RIGHT CAROTID SYSTEM: Common carotid artery is patent, mild calcific atherosclerosis. Mild calcific atherosclerosis of the carotid bifurcation without hemodynamically significant stenosis by NASCET criteria. Patent internal carotid artery with mild luminal irregularity compatible with atherosclerosis. LEFT CAROTID SYSTEM: Common carotid artery is patent. Mild calcific atherosclerosis of the carotid bifurcation without hemodynamically significant stenosis by NASCET criteria. Patent internal carotid artery with mild luminal irregularity compatible with atherosclerosis. VERTEBRAL ARTERIES:Streak artifact obscures LEFT vertebral artery origin. Patent codominant vertebral arteries, mild luminal irregularity compatible with atherosclerosis. SKELETON: No acute osseous process though bone windows have not been submitted. Degenerative change of the cervical spine better demonstrated on today's dedicated CT. OTHER NECK: Soft tissues of the neck are nonacute though, not tailored for evaluation. UPPER CHEST: Included lung apices are clear. Mild centrilobular emphysema. No superior mediastinal lymphadenopathy. CTA HEAD FINDINGS: ANTERIOR CIRCULATION: Patent cervical internal carotid arteries, petrous, cavernous and supra clinoid internal carotid arteries. Moderate stenosis RIGHT cavernous ICA. Patent anterior communicating artery. Patent anterior and  middle cerebral arteries. Moderate stenosis LEFT M2 origins. Moderate luminal irregularity anterior and middle cerebral arteries. No large vessel occlusion, flow-limiting stenosis, contrast extravasation or  aneurysm. POSTERIOR CIRCULATION: Patent vertebral arteries, vertebrobasilar junction and basilar artery, as well as main branch vessels. Calcific atherosclerosis resulting in moderate stenosis RIGHT V4 segment. Patent posterior cerebral arteries, moderate luminal irregularity compatible with atherosclerosis. No large vessel occlusion, flow-limiting stenosis, contrast extravasation or aneurysm. VENOUS SINUSES: Major dural venous sinuses are patent though not tailored for evaluation on this angiographic examination. ANATOMIC VARIANTS: Supernumerary anterior cerebral artery. DELAYED PHASE: Not performed. MIP images reviewed. IMPRESSION: CTA NECK: 1. No hemodynamically significant stenosis ICA's. Patent vertebral arteries. 2. **An incidental finding of potential clinical significance has been found. 4.2 cm ascending aorta. Recommend annual imaging followup by CTA or MRA. This recommendation follows 2010 ACCF/AHA/AATS/ACR/ASA/SCA/SCAI/SIR/STS/SVM Guidelines for the Diagnosis and Management of Patients with Thoracic Aortic Disease. Circulation. 2010; 121: Y195-K932. Aortic aneurysm NOS (ICD10-I71.9)** CTA HEAD: 1. No emergent large vessel occlusion. Moderate stenosis LEFT M2 origin and RIGHT cavernous ICA. 2. Moderate intracranial atherosclerosis. Electronically Signed   By: Elon Alas M.D.   On: 01/28/2019 00:26   Ct Angio Neck W Or Wo Contrast  Result Date: 01/28/2019 CLINICAL DATA:  RIGHT facial droop. History of hypertension and mental retardation. EXAM: CT ANGIOGRAPHY HEAD AND NECK TECHNIQUE: Multidetector CT imaging of the head and neck was performed using the standard protocol during bolus administration of intravenous contrast. Multiplanar CT image reconstructions and MIPs were obtained to evaluate the vascular anatomy. Carotid stenosis measurements (when applicable) are obtained utilizing NASCET criteria, using the distal internal carotid diameter as the denominator. CONTRAST:  126mL ISOVUE-370  IOPAMIDOL (ISOVUE-370) INJECTION 76% COMPARISON:  CT HEAD January 27, 2019 FINDINGS: CTA NECK FINDINGS: AORTIC ARCH: 4.2 cm ascending aorta. Mild calcific atherosclerosis aortic arch. Two vessel origin is a normal variant. The origins of the innominate, left Common carotid artery and subclavian artery are patent. RIGHT CAROTID SYSTEM: Common carotid artery is patent, mild calcific atherosclerosis. Mild calcific atherosclerosis of the carotid bifurcation without hemodynamically significant stenosis by NASCET criteria. Patent internal carotid artery with mild luminal irregularity compatible with atherosclerosis. LEFT CAROTID SYSTEM: Common carotid artery is patent. Mild calcific atherosclerosis of the carotid bifurcation without hemodynamically significant stenosis by NASCET criteria. Patent internal carotid artery with mild luminal irregularity compatible with atherosclerosis. VERTEBRAL ARTERIES:Streak artifact obscures LEFT vertebral artery origin. Patent codominant vertebral arteries, mild luminal irregularity compatible with atherosclerosis. SKELETON: No acute osseous process though bone windows have not been submitted. Degenerative change of the cervical spine better demonstrated on today's dedicated CT. OTHER NECK: Soft tissues of the neck are nonacute though, not tailored for evaluation. UPPER CHEST: Included lung apices are clear. Mild centrilobular emphysema. No superior mediastinal lymphadenopathy. CTA HEAD FINDINGS: ANTERIOR CIRCULATION: Patent cervical internal carotid arteries, petrous, cavernous and supra clinoid internal carotid arteries. Moderate stenosis RIGHT cavernous ICA. Patent anterior communicating artery. Patent anterior and middle cerebral arteries. Moderate stenosis LEFT M2 origins. Moderate luminal irregularity anterior and middle cerebral arteries. No large vessel occlusion, flow-limiting stenosis, contrast extravasation or aneurysm. POSTERIOR CIRCULATION: Patent vertebral arteries,  vertebrobasilar junction and basilar artery, as well as main branch vessels. Calcific atherosclerosis resulting in moderate stenosis RIGHT V4 segment. Patent posterior cerebral arteries, moderate luminal irregularity compatible with atherosclerosis. No large vessel occlusion, flow-limiting stenosis, contrast extravasation or aneurysm. VENOUS SINUSES: Major dural venous sinuses are patent though not tailored for evaluation on this angiographic examination. ANATOMIC VARIANTS: Supernumerary anterior cerebral artery. DELAYED PHASE: Not  performed. MIP images reviewed. IMPRESSION: CTA NECK: 1. No hemodynamically significant stenosis ICA's. Patent vertebral arteries. 2. **An incidental finding of potential clinical significance has been found. 4.2 cm ascending aorta. Recommend annual imaging followup by CTA or MRA. This recommendation follows 2010 ACCF/AHA/AATS/ACR/ASA/SCA/SCAI/SIR/STS/SVM Guidelines for the Diagnosis and Management of Patients with Thoracic Aortic Disease. Circulation. 2010; 121: W656-C127. Aortic aneurysm NOS (ICD10-I71.9)** CTA HEAD: 1. No emergent large vessel occlusion. Moderate stenosis LEFT M2 origin and RIGHT cavernous ICA. 2. Moderate intracranial atherosclerosis. Electronically Signed   By: Elon Alas M.D.   On: 01/28/2019 00:26   Ct Cervical Spine Wo Contrast  Result Date: 01/28/2019 CLINICAL DATA:  Fall. RIGHT facial droop, last seen normal at 2245 hours. History of hypertension and mental retardation. EXAM: CT HEAD WITHOUT CONTRAST CT CERVICAL SPINE WITHOUT CONTRAST TECHNIQUE: Multidetector CT imaging of the head and cervical spine was performed following the standard protocol without intravenous contrast. Multiplanar CT image reconstructions of the cervical spine were also generated. COMPARISON:  CT HEAD August 08, 2018 FINDINGS: CT HEAD FINDINGS BRAIN: No intraparenchymal hemorrhage, mass effect nor midline shift. Moderate parenchymal brain volume loss for age. No hydrocephalus.  Confluent supratentorial and patchy pontine white matter hypodensities. 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 No acute large vascular territory infarcts. No abnormal extra-axial fluid collections. Basal cisterns are patent. VASCULAR: Trace calcific atherosclerosis of the carotid siphons. SKULL: No skull fracture. No significant scalp soft tissue swelling. Small RIGHT frontal scalp lipoma. SINUSES/ORBITS: Trace paranasal sinus mucosal thickening. Mastoid air cells are well aerated.The included ocular globes and orbital contents are non-suspicious. OTHER: N patient is edentulous one. ASPECTS [facility] Stroke Program Early CT Score) - Ganglionic level infarction (caudate, lentiform nuclei, internal capsule, insula, M1-M3 cortex): 7 - Supraganglionic infarction (M4-M6 cortex): 3 Total score (0-10 with 10 being normal): 10 CT CERVICAL SPINE FINDINGS ALIGNMENT: Straightened lordosis. Minimal grade 1 C3-4 retrolisthesis. Advanced RIGHT C2-3 facet arthropathy. SKULL BASE AND VERTEBRAE: Cervical vertebral bodies and posterior elements are intact. Severe C3-4, C6-7 and moderate C7-T1 disc height loss with endplate spurring compatible with degenerative discs. SOFT TISSUES AND SPINAL CANAL: Nonacute. Mild calcific atherosclerosis carotid bifurcations. DISC LEVELS: No high-grade osseous canal stenosis or neural foraminal narrowing. UPPER CHEST: Lung apices are clear. OTHER: None. IMPRESSION: CT HEAD: 1. No acute intracranial process. 2. ASPECTS is 10 3. 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 4. Moderate parenchymal brain volume loss. CT CERVICAL SPINE: 1. No fracture or malalignment. Critical Value/emergent results text paged to [place], Neurology via [employer] secure system on 01/27/2019 at 12:01 am, including interpreting physician's phone number. Electronically Signed   By: Elon Alas M.D.   On: 01/28/2019 00:04   Ct Head Code Stroke Wo Contrast  Result Date:  01/28/2019 CLINICAL DATA:  Fall. RIGHT facial droop, last seen normal at 2245 hours. History of hypertension and mental retardation. EXAM: CT HEAD WITHOUT CONTRAST CT CERVICAL SPINE WITHOUT CONTRAST TECHNIQUE: Multidetector CT imaging of the head and cervical spine was performed following the standard protocol without intravenous contrast. Multiplanar CT image reconstructions of the cervical spine were also generated. COMPARISON:  CT HEAD August 08, 2018 FINDINGS: CT HEAD FINDINGS BRAIN: No intraparenchymal hemorrhage, mass effect nor midline shift. Moderate parenchymal brain volume loss for age. No hydrocephalus. Confluent supratentorial and patchy pontine white matter hypodensities. 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 No acute large vascular territory infarcts. No abnormal extra-axial fluid collections. Basal cisterns are patent. VASCULAR: Trace calcific atherosclerosis  of the carotid siphons. SKULL: No skull fracture. No significant scalp soft tissue swelling. Small RIGHT frontal scalp lipoma. SINUSES/ORBITS: Trace paranasal sinus mucosal thickening. Mastoid air cells are well aerated.The included ocular globes and orbital contents are non-suspicious. OTHER: N patient is edentulous one. ASPECTS [facility] Stroke Program Early CT Score) - Ganglionic level infarction (caudate, lentiform nuclei, internal capsule, insula, M1-M3 cortex): 7 - Supraganglionic infarction (M4-M6 cortex): 3 Total score (0-10 with 10 being normal): 10 CT CERVICAL SPINE FINDINGS ALIGNMENT: Straightened lordosis. Minimal grade 1 C3-4 retrolisthesis. Advanced RIGHT C2-3 facet arthropathy. SKULL BASE AND VERTEBRAE: Cervical vertebral bodies and posterior elements are intact. Severe C3-4, C6-7 and moderate C7-T1 disc height loss with endplate spurring compatible with degenerative discs. SOFT TISSUES AND SPINAL CANAL: Nonacute. Mild calcific atherosclerosis carotid bifurcations. DISC LEVELS: No high-grade osseous canal  stenosis or neural foraminal narrowing. UPPER CHEST: Lung apices are clear. OTHER: None. IMPRESSION: CT HEAD: 1. No acute intracranial process. 2. ASPECTS is 10 3. 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 4. Moderate parenchymal brain volume loss. CT CERVICAL SPINE: 1. No fracture or malalignment. Critical Value/emergent results text paged to [place], Neurology via AMION secure system on 01/27/2019 at 12:01 am, including interpreting physician's phone number. Electronically Signed   By: Elon Alas M.D.   On: 01/28/2019 00:04    PHYSICAL EXAM  middle aged african [place] male not in distress. . Afebrile. Head is nontraumatic. Neck is supple without bruit.    Cardiac exam no murmur or gallop. Lungs are clear to auscultation. Distal pulses are well felt.  Neurological Exam ;  Awake alert  severely dysarthric speech  and difficult to understand .follows simple midline and occasional one-step commands only. Extraocular moments are full range without nystagmus. Blinks to threat bilaterally. Mild right lower facial asymmetry. Tongue midline. Motor system exam limited due to patient cooperation but able to move all 4 extremities against gravity but perhaps has mild right-sided weakness though cooperation is limited. Sensory exam and coordination cannot be reliably tested. Deep tendon pulses are symmetric. Plantars are both downgoing. Gait not tested.  ASSESSMENT/PLAN Samuel Shelton is a 68 y.o. male with history of MR and HTN presenting with R hemiparesis s/p IV tPA 01/28/2019 at [facility].  Posssible L brain Stroke vs TIA s/p IV tPA. Workup pending   Code Stroke CT head No acute stroke. Old lacunes. Small vessel disease. Atrophy. ASPECTS 10.    CT CSpine no fx or malalignment   CTA head no ELVO. Mod stenosis L M2 and R ICA  CTA neck no sign stenosis.  4.2 cm ascending aorta  MRI  pending   2D Echo  pending   EEG pending   LDL 96  HgbA1c 5.6  SCDs for  VTE prophylaxis    No antithrombotic prior to admission, now on No antithrombotic as within 24h of tPA.  Will likely add aspirin after 24h if imaging negative  Therapy recommendations:  Pending. Ok to be OOB  Disposition:  pending - admitted from a group home, niece is [place] for return to group home tomorrow if he does well over night  Hypertension  Stable . BP goal per post tPA guidelines . Long-term BP goal normotensive  Hyperlipidemia  Home meds:  No statin  LDL 96, goal < 70  Add liipitor 40  Continue statin at discharge  Other Stroke Risk Factors  Advanced age  Former Cigarette smoker  Family hx stroke (mother)  Other Active Problems  Baseline MR  Hospital day # 0  Burnetta Sabin, MSN, APRN, ANVP-BC, AGPCNP-BC Advanced Practice Stroke Nurse [place] for Schedule & Pager information 01/28/2019 9:21 AM  I have personally obtained history,examined this patient, reviewed notes, independently viewed imaging studies, participated in medical decision making and plan of care.ROS completed by me personally and pertinent positives fully documented  I have made any additions or clarifications directly to the above note. Agree with note above. He presented with right hemiparesis and has some dysarthria and mental retardation at baseline. He received IV tPA and appears to have made improvement. Recommend strict blood pressure control as per post TPA protocol. Try MRI scan of the brain patient is cooperative otherwise CT scan. Complete ongoing stroke workup. No family available at the bedside for discussion. Start oral diet. Mobilize out of bed and therapy consults.This patient is critically ill and at significant risk of neurological worsening, death and care requires constant monitoring of vital signs, hemodynamics,respiratory and cardiac monitoring, extensive review of multiple databases, frequent neurological assessment, discussion with family,  other specialists and medical decision making of high complexity.I have made any additions or clarifications directly to the above note.This critical care time does not reflect procedure time, or teaching time or supervisory time of PA/NP/Med Resident etc but could involve care discussion time.  I spent 30 minutes of neurocritical care time  in the care of  this patient.      Antony Contras, MD Medical Director [place] Pager: [phone] 01/28/2019 4:24 PM  To contact Stroke Continuity provider, please refer to [email or web address] After hours, contact General Neurology

## 2019-01-28 NOTE — H&P (Addendum)
Chief Complaint: Right-sided weakness  History obtained from: EMS and Chart    HPI:                                                                                                                                       Samuel Shelton is an 68 y.o. male past medical history mental retardation lives in assisted living facility, hypertension presents to the ER as a code stroke for right-sided weakness.  He was last seen normal by nursing staff at 10:45 PM.  Around 11 PM staff noticed patient was not moving the right side and had hit his head and notified EMS. Patient was answering questions intermittently noted to have right facial droop and right side was plegic although started to improve on his way to the emergency room. Pressure was 387 systolic,  glucose was 87.   Date last known well: 2.2.20 20 Time last known well: 10:45 PM tPA Given: yes NIHSS: Was 14 Baseline MRS 2-3   Past Medical History:  Diagnosis Date  . Chronic mental illness   . Edema of both legs   . Elevated PSA   . Elevated PSA   . Hypertension   . Mental retardation   . Phimosis     Past Surgical History:  Procedure Laterality Date  . PROSTATE BIOPSY N/A 06/22/2015   Procedure: PROSTATE BIOPSY;  Surgeon: Hollice Espy, MD;  Location: [facility] ORS;  Service: Urology;  Laterality: N/A;  . PROSTATE BIOPSY N/A 09/27/2016   Procedure: PROSTATE BIOPSY;  Surgeon: Hollice Espy, MD;  Location: [facility] OR[facility];  Service: Urology;  Laterality: N/A;    Family History  Problem Relation Age of Onset  . Stroke Mother   . Hypertension Sister   . Diabetes type II Sister    Social History:  reports that he has quit smoking. His smoking use included cigarettes. He smoked 0.50 packs per day. He has never used smokeless tobacco. He reports that he does not drink alcohol or use drugs.  Allergies: No Known Allergies  Medications:                                                                                                                         I reviewed home medications   ROS:  14 systems reviewed and negative except above, limited review as patient is a poor historian and seems altered   Examination:                                                                                                      General: Appears well-developed  Psych: Affect appropriate to situation Eyes: No scleral injection HENT: No OP obstrucion Head: Normocephalic.  Cardiovascular: Normal rate and regular rhythm.  Respiratory: Effort normal and breath sounds normal to anterior ascultation GI: Soft.  No distension. There is no tenderness.  Skin: WDI    Neurological Examination Mental Status: Awake but slightly drowsy, following commands.  Answers his name but could not answer his age or month.  He was not talking in sentences. Cranial Nerves: II: Visual fields : Difficult to assess initially blink to threat was less on the right side, but later able to count fingers all 4 quadrants III,IV, VI: ptosis not present, extra-ocular motions intact bilaterally, pupils equal, round, reactive to light and accommodation V,VII: Right facial droop VIII: hearing normal bilaterally IX,X: uvula rises symmetrically XI: bilateral shoulder shrug XII: midline tongue extension Motor: Right : Upper extremity   3/5    Left:     Upper extremity   5/5  Lower extremity   3/5     Lower extremity   5/5 Tone and bulk:normal tone throughout; no atrophy noted Sensory: Unable to assess accurately Deep Tendon Reflexes: 2+ and symmetric throughout Plantars: Right: downgoing   Left: downgoing Cerebellar: No obvious ataxia seen out of proportion to weakness Gait: Not assessed     Lab Results: Basic Metabolic Panel: Recent Labs  Lab 01/27/19 2345  NA 136  K 3.3*  CL 102  CO2 24  GLUCOSE 96  BUN 18  CREATININE 1.18   CALCIUM 8.9    CBC: Recent Labs  Lab 01/27/19 2345  WBC 4.5  NEUTROABS 2.1  HGB 12.6*  HCT 38.8*  MCV 88.0  PLT 202    Coagulation Studies: Recent Labs    01/27/19 2345  LABPROT 13.7  INR 1.06    Imaging: Ct Angio Head W Or Wo Contrast  Result Date: 01/28/2019 CLINICAL DATA:  RIGHT facial droop. History of hypertension and mental retardation. EXAM: CT ANGIOGRAPHY HEAD AND NECK TECHNIQUE: Multidetector CT imaging of the head and neck was performed using the standard protocol during bolus administration of intravenous contrast. Multiplanar CT image reconstructions and MIPs were obtained to evaluate the vascular anatomy. Carotid stenosis measurements (when applicable) are obtained utilizing NASCET criteria, using the distal internal carotid diameter as the denominator. CONTRAST:  138mL ISOVUE-370 IOPAMIDOL (ISOVUE-370) INJECTION 76% COMPARISON:  CT HEAD January 27, 2019 FINDINGS: CTA NECK FINDINGS: AORTIC ARCH: 4.2 cm ascending aorta. Mild calcific atherosclerosis aortic arch. Two vessel origin is a normal variant. The origins of the innominate, left Common carotid artery and subclavian artery are patent. RIGHT CAROTID SYSTEM: Common carotid artery is patent, mild calcific atherosclerosis. Mild calcific atherosclerosis of the carotid bifurcation without hemodynamically significant stenosis by NASCET criteria. Patent internal carotid artery with mild luminal irregularity  compatible with atherosclerosis. LEFT CAROTID SYSTEM: Common carotid artery is patent. Mild calcific atherosclerosis of the carotid bifurcation without hemodynamically significant stenosis by NASCET criteria. Patent internal carotid artery with mild luminal irregularity compatible with atherosclerosis. VERTEBRAL ARTERIES:Streak artifact obscures LEFT vertebral artery origin. Patent codominant vertebral arteries, mild luminal irregularity compatible with atherosclerosis. SKELETON: No acute osseous process though bone windows  have not been submitted. Degenerative change of the cervical spine better demonstrated on today's dedicated CT. OTHER NECK: Soft tissues of the neck are nonacute though, not tailored for evaluation. UPPER CHEST: Included lung apices are clear. Mild centrilobular emphysema. No superior mediastinal lymphadenopathy. CTA HEAD FINDINGS: ANTERIOR CIRCULATION: Patent cervical internal carotid arteries, petrous, cavernous and supra clinoid internal carotid arteries. Moderate stenosis RIGHT cavernous ICA. Patent anterior communicating artery. Patent anterior and middle cerebral arteries. Moderate stenosis LEFT M2 origins. Moderate luminal irregularity anterior and middle cerebral arteries. No large vessel occlusion, flow-limiting stenosis, contrast extravasation or aneurysm. POSTERIOR CIRCULATION: Patent vertebral arteries, vertebrobasilar junction and basilar artery, as well as main branch vessels. Calcific atherosclerosis resulting in moderate stenosis RIGHT V4 segment. Patent posterior cerebral arteries, moderate luminal irregularity compatible with atherosclerosis. No large vessel occlusion, flow-limiting stenosis, contrast extravasation or aneurysm. VENOUS SINUSES: Major dural venous sinuses are patent though not tailored for evaluation on this angiographic examination. ANATOMIC VARIANTS: Supernumerary anterior cerebral artery. DELAYED PHASE: Not performed. MIP images reviewed. IMPRESSION: CTA NECK: 1. No hemodynamically significant stenosis ICA's. Patent vertebral arteries. 2. **An incidental finding of potential clinical significance has been found. 4.2 cm ascending aorta. Recommend annual imaging followup by CTA or MRA. This recommendation follows 2010 ACCF/AHA/AATS/ACR/ASA/SCA/SCAI/SIR/STS/SVM Guidelines for the Diagnosis and Management of Patients with Thoracic Aortic Disease. Circulation. 2010; 121: P824-M353. Aortic aneurysm NOS (ICD10-I71.9)** CTA HEAD: 1. No emergent large vessel occlusion. Moderate stenosis  LEFT M2 origin and RIGHT cavernous ICA. 2. Moderate intracranial atherosclerosis. Electronically Signed   By: Elon Alas M.D.   On: 01/28/2019 00:26   Ct Angio Neck W Or Wo Contrast  Result Date: 01/28/2019 CLINICAL DATA:  RIGHT facial droop. History of hypertension and mental retardation. EXAM: CT ANGIOGRAPHY HEAD AND NECK TECHNIQUE: Multidetector CT imaging of the head and neck was performed using the standard protocol during bolus administration of intravenous contrast. Multiplanar CT image reconstructions and MIPs were obtained to evaluate the vascular anatomy. Carotid stenosis measurements (when applicable) are obtained utilizing NASCET criteria, using the distal internal carotid diameter as the denominator. CONTRAST:  171mL ISOVUE-370 IOPAMIDOL (ISOVUE-370) INJECTION 76% COMPARISON:  CT HEAD January 27, 2019 FINDINGS: CTA NECK FINDINGS: AORTIC ARCH: 4.2 cm ascending aorta. Mild calcific atherosclerosis aortic arch. Two vessel origin is a normal variant. The origins of the innominate, left Common carotid artery and subclavian artery are patent. RIGHT CAROTID SYSTEM: Common carotid artery is patent, mild calcific atherosclerosis. Mild calcific atherosclerosis of the carotid bifurcation without hemodynamically significant stenosis by NASCET criteria. Patent internal carotid artery with mild luminal irregularity compatible with atherosclerosis. LEFT CAROTID SYSTEM: Common carotid artery is patent. Mild calcific atherosclerosis of the carotid bifurcation without hemodynamically significant stenosis by NASCET criteria. Patent internal carotid artery with mild luminal irregularity compatible with atherosclerosis. VERTEBRAL ARTERIES:Streak artifact obscures LEFT vertebral artery origin. Patent codominant vertebral arteries, mild luminal irregularity compatible with atherosclerosis. SKELETON: No acute osseous process though bone windows have not been submitted. Degenerative change of the cervical spine better  demonstrated on today's dedicated CT. OTHER NECK: Soft tissues of the neck are nonacute though, not tailored for evaluation. UPPER CHEST: Included lung apices  are clear. Mild centrilobular emphysema. No superior mediastinal lymphadenopathy. CTA HEAD FINDINGS: ANTERIOR CIRCULATION: Patent cervical internal carotid arteries, petrous, cavernous and supra clinoid internal carotid arteries. Moderate stenosis RIGHT cavernous ICA. Patent anterior communicating artery. Patent anterior and middle cerebral arteries. Moderate stenosis LEFT M2 origins. Moderate luminal irregularity anterior and middle cerebral arteries. No large vessel occlusion, flow-limiting stenosis, contrast extravasation or aneurysm. POSTERIOR CIRCULATION: Patent vertebral arteries, vertebrobasilar junction and basilar artery, as well as main branch vessels. Calcific atherosclerosis resulting in moderate stenosis RIGHT V4 segment. Patent posterior cerebral arteries, moderate luminal irregularity compatible with atherosclerosis. No large vessel occlusion, flow-limiting stenosis, contrast extravasation or aneurysm. VENOUS SINUSES: Major dural venous sinuses are patent though not tailored for evaluation on this angiographic examination. ANATOMIC VARIANTS: Supernumerary anterior cerebral artery. DELAYED PHASE: Not performed. MIP images reviewed. IMPRESSION: CTA NECK: 1. No hemodynamically significant stenosis ICA's. Patent vertebral arteries. 2. **An incidental finding of potential clinical significance has been found. 4.2 cm ascending aorta. Recommend annual imaging followup by CTA or MRA. This recommendation follows 2010 ACCF/AHA/AATS/ACR/ASA/SCA/SCAI/SIR/STS/SVM Guidelines for the Diagnosis and Management of Patients with Thoracic Aortic Disease. Circulation. 2010; 121: N989-Q119. Aortic aneurysm NOS (ICD10-I71.9)** CTA HEAD: 1. No emergent large vessel occlusion. Moderate stenosis LEFT M2 origin and RIGHT cavernous ICA. 2. Moderate intracranial  atherosclerosis. Electronically Signed   By: Elon Alas M.D.   On: 01/28/2019 00:26   Ct Cervical Spine Wo Contrast  Result Date: 01/28/2019 CLINICAL DATA:  Fall. RIGHT facial droop, last seen normal at 2245 hours. History of hypertension and mental retardation. EXAM: CT HEAD WITHOUT CONTRAST CT CERVICAL SPINE WITHOUT CONTRAST TECHNIQUE: Multidetector CT imaging of the head and cervical spine was performed following the standard protocol without intravenous contrast. Multiplanar CT image reconstructions of the cervical spine were also generated. COMPARISON:  CT HEAD August 08, 2018 FINDINGS: CT HEAD FINDINGS BRAIN: No intraparenchymal hemorrhage, mass effect nor midline shift. Moderate parenchymal brain volume loss for age. No hydrocephalus. Confluent supratentorial and patchy pontine white matter hypodensities. 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 No acute large vascular territory infarcts. No abnormal extra-axial fluid collections. Basal cisterns are patent. VASCULAR: Trace calcific atherosclerosis of the carotid siphons. SKULL: No skull fracture. No significant scalp soft tissue swelling. Small RIGHT frontal scalp lipoma. SINUSES/ORBITS: Trace paranasal sinus mucosal thickening. Mastoid air cells are well aerated.The included ocular globes and orbital contents are non-suspicious. OTHER: N patient is edentulous one. ASPECTS [facility] Stroke Program Early CT Score) - Ganglionic level infarction (caudate, lentiform nuclei, internal capsule, insula, M1-M3 cortex): 7 - Supraganglionic infarction (M4-M6 cortex): 3 Total score (0-10 with 10 being normal): 10 CT CERVICAL SPINE FINDINGS ALIGNMENT: Straightened lordosis. Minimal grade 1 C3-4 retrolisthesis. Advanced RIGHT C2-3 facet arthropathy. SKULL BASE AND VERTEBRAE: Cervical vertebral bodies and posterior elements are intact. Severe C3-4, C6-7 and moderate C7-T1 disc height loss with endplate spurring compatible with degenerative  discs. SOFT TISSUES AND SPINAL CANAL: Nonacute. Mild calcific atherosclerosis carotid bifurcations. DISC LEVELS: No high-grade osseous canal stenosis or neural foraminal narrowing. UPPER CHEST: Lung apices are clear. OTHER: None. IMPRESSION: CT HEAD: 1. No acute intracranial process. 2. ASPECTS is 10 3. 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 4. Moderate parenchymal brain volume loss. CT CERVICAL SPINE: 1. No fracture or malalignment. Critical Value/emergent results text paged to [place], Neurology via [employer] secure system on 01/27/2019 at 12:01 am, including interpreting physician's phone number. Electronically Signed   By: Elon Alas M.D.   On: 01/28/2019 00:04  Ct Head Code Stroke Wo Contrast  Result Date: 01/28/2019 CLINICAL DATA:  Fall. RIGHT facial droop, last seen normal at 2245 hours. History of hypertension and mental retardation. EXAM: CT HEAD WITHOUT CONTRAST CT CERVICAL SPINE WITHOUT CONTRAST TECHNIQUE: Multidetector CT imaging of the head and cervical spine was performed following the standard protocol without intravenous contrast. Multiplanar CT image reconstructions of the cervical spine were also generated. COMPARISON:  CT HEAD August 08, 2018 FINDINGS: CT HEAD FINDINGS BRAIN: No intraparenchymal hemorrhage, mass effect nor midline shift. Moderate parenchymal brain volume loss for age. No hydrocephalus. Confluent supratentorial and patchy pontine white matter hypodensities. 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 No acute large vascular territory infarcts. No abnormal extra-axial fluid collections. Basal cisterns are patent. VASCULAR: Trace calcific atherosclerosis of the carotid siphons. SKULL: No skull fracture. No significant scalp soft tissue swelling. Small RIGHT frontal scalp lipoma. SINUSES/ORBITS: Trace paranasal sinus mucosal thickening. Mastoid air cells are well aerated.The included ocular globes and orbital contents are  non-suspicious. OTHER: N patient is edentulous one. ASPECTS [facility] Stroke Program Early CT Score) - Ganglionic level infarction (caudate, lentiform nuclei, internal capsule, insula, M1-M3 cortex): 7 - Supraganglionic infarction (M4-M6 cortex): 3 Total score (0-10 with 10 being normal): 10 CT CERVICAL SPINE FINDINGS ALIGNMENT: Straightened lordosis. Minimal grade 1 C3-4 retrolisthesis. Advanced RIGHT C2-3 facet arthropathy. SKULL BASE AND VERTEBRAE: Cervical vertebral bodies and posterior elements are intact. Severe C3-4, C6-7 and moderate C7-T1 disc height loss with endplate spurring compatible with degenerative discs. SOFT TISSUES AND SPINAL CANAL: Nonacute. Mild calcific atherosclerosis carotid bifurcations. DISC LEVELS: No high-grade osseous canal stenosis or neural foraminal narrowing. UPPER CHEST: Lung apices are clear. OTHER: None. IMPRESSION: CT HEAD: 1. No acute intracranial process. 2. ASPECTS is 10 3. 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 4. Moderate parenchymal brain volume loss. CT CERVICAL SPINE: 1. No fracture or malalignment. Critical Value/emergent results text paged to [place], Neurology via [employer] secure system on 01/27/2019 at 12:01 am, including interpreting physician's phone number. Electronically Signed   By: Elon Alas M.D.   On: 01/28/2019 00:04     ASSESSMENT AND PLAN  68 y.o. male past medical history mental retardation lives in assisted living facility, hypertension presents to the ER as a code stroke for right-sided weakness.  NIHSS 14 on arrival -received IV TPA discussing risk versus benefits with his POA.  CTA negative for large vessel occlusion.  Other possibility is patient had a seizure with postictal state  Suspected acute ischemic stroke,  -CTA head and neck was negative for large vessel occlusion however shows left M2 stenosis. -MRI of the brain: Pending -Blood pressure goal less than 180/105 mmHg as patient received  TPA -Repeat CT head in 24 hours to rule out hemorrhage -No aspirin until 24 hours -Statin when patient passes swallow evaluation -PT OT -Lipid profile, A1c -Stroke swallow screen   Possible seizure -EEG tomorrow morning -No witnessed seizure-like activity and therefore will not start AEDs  Hypertension -Permissive hypertension up to 180/105 mmHg   Full code    This patient is neurologically critically ill due to possible acute stroke and received IV TPA.  He is at risk for significant risk of neurological worsening from cerebral edema,  death from brain herniation, heart failure, hemorrhagic conversion, infection, respiratory failure and seizure. This patient's care requires constant monitoring of vital signs, hemodynamics, respiratory and cardiac monitoring, review of multiple databases, neurological assessment, discussion with family, other specialists and medical decision making  of high complexity.  I spent 50  minutes of neurocritical time in the care of this patient.     Yarianna Varble [facility] Pager Number [phone]

## 2019-01-28 NOTE — Plan of Care (Signed)
Pt has been placed on Dysphagia II diet.  Will continue to monitor.  Hiram Gash RN

## 2019-01-28 NOTE — Procedures (Signed)
  Spiro A. Merlene Laughter, MD     [email or web address]           HISTORY: This is a 68 year old male who presents with right-sided weakness.  The studies been done to evaluate for potential Todd's paralysis/post seizure paralysis as the etiology.  MEDICATIONS: Scheduled Meds: .  stroke: mapping our early stages of recovery book   Does not apply Once  . atorvastatin  40 mg Oral q1800  . pantoprazole (PROTONIX) IV  40 mg Intravenous QHS   Continuous Infusions: . sodium chloride 75 mL/hr at 01/28/19 1000   PRN Meds:.acetaminophen **OR** acetaminophen (TYLENOL) oral liquid 160 mg/5 mL **OR** acetaminophen  Prior to Admission medications   Medication Sig Start Date End Date Taking? Authorizing Provider  acetaminophen (TYLENOL) 500 MG tablet Take 500 mg by mouth every 4 (four) hours as needed for mild pain.   Yes [provider]  alum & mag hydroxide-simeth ([place]) 200-200-20 MG/5ML suspension Take 30 mLs by mouth every 6 (six) hours as needed for indigestion or heartburn.   Yes [provider]  finasteride (PROSCAR) 5 MG tablet Take 1 tablet (5 mg total) by mouth daily. 09/05/18  Yes Hollice Espy, MD  guaifenesin (ROBITUSSIN) 100 MG/5ML syrup Take 200 mg by mouth 3 (three) times daily as needed for cough.   Yes [provider]  hydrochlorothiazide (HYDRODIURIL) 12.5 MG tablet Take 12.5 mg by mouth.  03/02/15  Yes [provider]  loperamide (IMODIUM) 2 MG capsule Take 2 mg by mouth daily as needed for diarrhea or loose stools.   Yes [provider]  magnesium hydroxide (MILK OF MAGNESIA) 400 MG/5ML suspension Take 30 mLs by mouth at bedtime as needed for mild constipation.   Yes [provider]  neomycin-bacitracin-polymyxin (NEOSPORIN) OINT Apply 1 application topically daily as needed for irritation or wound care.   Yes [provider]  tamsulosin (FLOMAX) 0.4 MG CAPS capsule Take 1 capsule (0.4 mg total) by  mouth daily. 10/27/17  Yes Hollice Espy, MD      ANALYSIS: A 16 channel recording using standard 10 20 measurements is conducted for 21 minutes.  Background activity gets as high as 7 Hz.  However, there is episodic generalized delta slowing seen from time to time.  Photic stimulation and hyperventilation are not conducted.  There is no focal or lateralized slowing.  There is no epileptiform activity is documented.   IMPRESSION: 1. This recording of the awake and drowsy state shows mild global slowing indicating a mild global encephalopathy.  However, there is no epileptiform activity is observed.        Christop Hippert A. Merlene Laughter, M.D.  Diplomate, Tax adviser of Psychiatry and Neurology ( Neurology).

## 2019-01-28 NOTE — Evaluation (Signed)
Physical Therapy Evaluation Patient Details Name: Samuel Shelton MRN: [id number] DOB: 05-21-1951 Today's Date: 01/28/2019   History of Present Illness  Samuel Shelton is an 68 y.o. male past medical history mental retardation lives in assisted living facility, hypertension presents to the ER as a code stroke for right-sided weakness.  tPA given however CT negative for stroke.  Clinical Impression  Pt admitted with above. Pt with slurred words and difficulty following commands. Per niece pt was ambulating and completing ADLs indep PTA. Pt now requiring maxA for all mobility and ADLs at this time. Pt to benefit from ST-SNF  Upon d/c to achieve safe mod I level of function to return to ALF. Acute PT to cont to follow.     Follow Up Recommendations SNF;Supervision/Assistance - 24 hour    Equipment Recommendations  None recommended by PT(TBD)    Recommendations for Other Services       Precautions / Restrictions Precautions Precautions: Fall Precaution Comments: h/o mental retardation Restrictions Weight Bearing Restrictions: No      Mobility  Bed Mobility Overal bed mobility: Needs Assistance Bed Mobility: Supine to Sit     Supine to sit: Max assist;+2 for physical assistance;+2 for safety/equipment     General bed mobility comments: requires +2 assist with total assist for B LE mgmt and mod assist to ascend trunk to sitting EOB   Transfers Overall transfer level: Needs assistance Equipment used: 2 person hand held assist Transfers: Sit to/from [facility] Sit to Stand: Max assist;+2 physical assistance;+2 safety/equipment Stand pivot transfers: Max assist;+2 physical assistance;+2 safety/equipment       General transfer comment: max assist to initate and coordinate transfer, noted R lateral lean and extension to trunk with poor awareness and attention to task   Ambulation/Gait Ambulation/Gait assistance: Max assist;+2 physical assistance Gait Distance  (Feet): 5 Feet Assistive device: 2 person hand held assist Gait Pattern/deviations: Step-to pattern;Decreased stride length;Narrow base of support Gait velocity: slow   General Gait Details: pt unable to sequence stepping despite max tactile cues to weight shift and advance LEs, pt very distracted with looking outside the window  Stairs            Wheelchair Mobility    Modified Rankin (Stroke Patients Only) Modified Rankin (Stroke Patients Only) Pre-Morbid Rankin Score: Slight disability Modified Rankin: Severe disability     Balance Overall balance assessment: Needs assistance Sitting-balance support: No upper extremity supported;Feet supported Sitting balance-Leahy Scale: Fair Sitting balance - Comments: R lateral lean, cueing to correct; requires min to min guard assist staticall y   Standing balance support: No upper extremity supported;Bilateral upper extremity supported Standing balance-Leahy Scale: Poor Standing balance comment: reliant on external support, at times R lateral lean, increased trunk extension and posterior lean                              Pertinent Vitals/Pain Pain Assessment: Faces Faces Pain Scale: No hurt    Home Living Family/patient expects to be discharged to:: Assisted living([facility] )     Type of Home: Assisted living                Prior Function Level of Independence: Needs assistance   Gait / Transfers Assistance Needed: ambulated without AD  ADL's / Homemaking Assistance Needed: supervision for hygiene  Comments: goes to dinning hall     Hand Dominance   Dominant Hand: Right    Extremity/Trunk Assessment  Upper Extremity Assessment Upper Extremity Assessment: Defer to OT evaluation RUE Deficits / Details: grossly 3-/5, limited shoulder flexion actively to 90 (PROM funcitonal)  LUE Deficits / Details: grossly 3+/5     Lower Extremity Assessment Lower Extremity Assessment: Difficult to  assess due to impaired cognition    Cervical / Trunk Assessment Cervical / Trunk Assessment: Normal  Communication   Communication: Expressive difficulties(h/o stutter)  Cognition Arousal/Alertness: Awake/alert Behavior During Therapy: Flat affect Overall Cognitive Status: History of cognitive impairments - at baseline                                 General Comments: pt able to follow simple 1 step commands inconsistenly, oriented to self       General Comments General comments (skin integrity, edema, etc.): VSS    Exercises     Assessment/Plan    PT Assessment Patient needs continued PT services  PT Problem List Decreased strength;Decreased activity tolerance;Decreased balance;Decreased mobility;Pain;Decreased safety awareness;Decreased cognition       PT Treatment Interventions DME instruction;Stair training;Gait training;Functional mobility training;Therapeutic activities;Therapeutic exercise;Balance training    PT Goals (Current goals can be found in the Care Plan section)  Acute Rehab PT Goals Patient Stated Goal: none stated PT Goal Formulation: Patient unable to participate in goal setting Time For Goal Achievement: 02/11/19 Potential to Achieve Goals: Good    Frequency Min 3X/week   Barriers to discharge        Co-evaluation PT/OT/SLP Co-Evaluation/Treatment: Yes Reason for Co-Treatment: Complexity of the patient's impairments (multi-system involvement) PT goals addressed during session: Mobility/safety with mobility OT goals addressed during session: ADL's and self-care;Other (comment)(mobility)       AM-PAC PT "6 Clicks" Mobility  Outcome Measure Help needed turning from your back to your side while in a flat bed without using bedrails?: A Lot Help needed moving from lying on your back to sitting on the side of a flat bed without using bedrails?: A Lot Help needed moving to and from a bed to a chair (including a wheelchair)?: A Lot Help  needed standing up from a chair using your arms (e.g., wheelchair or bedside chair)?: A Lot Help needed to walk in hospital room?: Total Help needed climbing 3-5 steps with a railing? : Total 6 Click Score: 10    End of Session Equipment Utilized During Treatment: Gait belt Activity Tolerance: Patient tolerated treatment well Patient left: in chair;with call bell/phone within reach;with chair alarm set;with nursing/sitter in room Nurse Communication: Mobility status PT Visit Diagnosis: Unsteadiness on feet (R26.81)    Time: 0300-9233 PT Time Calculation (min) (ACUTE ONLY): 32 min   Charges:   PT Evaluation $PT Eval Moderate Complexity: 1 Mod         Kittie Plater, PT, DPT Acute Rehabilitation Services Pager #: [phone] Office #: [phone]   Berline Lopes 01/28/2019, 2:30 PM

## 2019-01-28 NOTE — Evaluation (Addendum)
Speech Language Pathology Evaluation Patient Details Name: Samuel Shelton MRN: [id number] DOB: [birth year] Today's Date: 01/28/2019 Time: 7564-3329 SLP Time Calculation (min) (ACUTE ONLY): 12 min  Problem List:  Patient Active Problem List   Diagnosis Date Noted  . 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 Edema leg 08/20/2014  . Chronic mental disorder 08/20/2014  . BP (high blood pressure) 08/20/2014  . Intellectual disability 08/20/2014   Past Medical History:  Past Medical History:  Diagnosis Date  . Chronic mental illness   . Edema of both legs   . Elevated PSA   . Elevated PSA   . Hypertension   . Mental retardation   . Phimosis    Past Surgical History:  Past Surgical History:  Procedure Laterality Date  . PROSTATE BIOPSY N/A 06/22/2015   Procedure: PROSTATE BIOPSY;  Surgeon: Hollice Espy, MD;  Location: [facility] ORS;  Service: Urology;  Laterality: N/A;  . PROSTATE BIOPSY N/A 09/27/2016   Procedure: PROSTATE BIOPSY;  Surgeon: Hollice Espy, MD;  Location: [facility] ORS;  Service: Urology;  Laterality: N/A;   HPI:  Samuel Shelton is an 68 y.o. male past medical history mental retardation lives in assisted living facility, hypertension presents to the ER as a code stroke for right-sided weakness. 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   Assessment / Plan / Recommendation Clinical Impression  Patient has a history of developmental disabilities at baseline and lives in a group home without knowledge of baseline status re: communication and cognition. Speech is dysarthric and approximately 40-50% intelligible in words. Initiation characterized by part word repetition which can be seen in patients with developmental delays. Pt followed simple one step commands appropriately during evaluation. Stated month of his birth appropriately but not date given phonemic cue; unable to recall current place. Right labial/facial weakness and decreased ROM and right visual  inattention present. ST will continue for establishment of baseline abilities and provide assist for basic communication/cognition.    SLP Assessment  SLP Recommendation/Assessment: Patient needs continued Speech Lanaguage Pathology Services SLP Visit Diagnosis: Dysphagia, unspecified (R13.10)    Follow Up Recommendations  Other (comment)(TBD)    Frequency and Duration min 2x/week  2 weeks      SLP Evaluation Cognition  Overall Cognitive Status: History of cognitive impairments - at baseline Arousal/Alertness: Awake/alert Orientation Level: Oriented to person Attention: Sustained Sustained Attention: Impaired Sustained Attention Impairment: Verbal complex Memory: (TBA) Awareness: Impaired Awareness Impairment: Intellectual impairment Problem Solving: Impaired Problem Solving Impairment: Functional basic Safety/Judgment: Impaired       Comprehension  Auditory Comprehension Overall Auditory Comprehension: Appears within functional limits for tasks assessed Yes/No Questions: Within Functional Limits Commands: Within Functional Limits(functional for one step) Interfering Components: Processing speed;Visual impairments Visual Recognition/Discrimination Discrimination: Not tested Reading Comprehension Reading Status: Not tested    Expression Expression Primary Mode of Expression: Verbal Verbal Expression Overall Verbal Expression: Other (comment)(difficult to know baseline, possible close?) Initiation: No impairment Level of Generative/Spontaneous Verbalization: Phrase Repetition: No impairment(for short phrase) Naming: Impairment Responsive: 0-25% accurate Confrontation: Impaired Convergent: Not tested Divergent: Not tested Verbal Errors: (see impression) Pragmatics: (deficits at baseline) Written Expression Dominant Hand: (uncertain) Written Expression: Not tested   Oral / Motor  Oral Motor/Sensory Function Overall Oral Motor/Sensory Function: Moderate  impairment Facial ROM: Reduced right;Suspected CN VII (facial) dysfunction Facial Symmetry: Abnormal symmetry right;Suspected CN VII (facial) dysfunction Facial Strength: Reduced right;Suspected CN VII (facial) dysfunction Facial Sensation: Reduced right;Suspected CN V (Trigeminal) dysfunction Motor Speech Overall Motor Speech: Other (comment)(baseline?) Respiration:  Within functional limits Phonation: Normal Resonance: Within functional limits Articulation: Impaired Level of Impairment: Word Intelligibility: Intelligibility reduced Word: 50-74% accurate Phrase: 50-74% accurate Motor Planning: Witnin functional limits   GO                    Houston Siren 01/28/2019, 9:43 AM  Orbie Pyo Colvin Caroli.Ed Risk analyst [phone] Office [phone]

## 2019-01-28 NOTE — Progress Notes (Signed)
EEG completed, results pending. 

## 2019-01-29 ENCOUNTER — Ambulatory Visit (HOSPITAL_COMMUNITY): Payer: Medicare Other

## 2019-01-29 DIAGNOSIS — N400 Enlarged prostate without lower urinary tract symptoms: Secondary | ICD-10-CM | POA: Diagnosis present

## 2019-01-29 DIAGNOSIS — Z823 Family history of stroke: Secondary | ICD-10-CM

## 2019-01-29 DIAGNOSIS — E785 Hyperlipidemia, unspecified: Secondary | ICD-10-CM | POA: Diagnosis present

## 2019-01-29 DIAGNOSIS — I639 Cerebral infarction, unspecified: Secondary | ICD-10-CM

## 2019-01-29 LAB — ECHOCARDIOGRAM COMPLETE
Height: 74 in
Weight: 3280.44 oz

## 2019-01-29 MED ORDER — FINASTERIDE 5 MG PO TABS
5.0000 mg | ORAL_TABLET | Freq: Every day | ORAL | Status: DC
  Administered 2019-01-29 – 2019-01-31 (×3): 5 mg via ORAL
  Filled 2019-01-29 (×3): qty 1

## 2019-01-29 MED ORDER — PANTOPRAZOLE SODIUM 40 MG PO TBEC: 40.0000 mg | DELAYED_RELEASE_TABLET | Freq: Every day | ORAL | Status: DC

## 2019-01-29 MED ORDER — LABETALOL HCL 5 MG/ML IV SOLN: 10.0000 mg | INTRAVENOUS | Status: DC | PRN

## 2019-01-29 MED ORDER — TAMSULOSIN HCL 0.4 MG PO CAPS
0.4000 mg | ORAL_CAPSULE | Freq: Every day | ORAL | Status: DC
  Administered 2019-01-29 – 2019-01-31 (×3): 0.4 mg via ORAL
  Filled 2019-01-29 (×3): qty 1

## 2019-01-29 MED ORDER — CLOPIDOGREL BISULFATE 75 MG PO TABS
75.0000 mg | ORAL_TABLET | Freq: Every day | ORAL | Status: DC
  Administered 2019-01-29 – 2019-01-31 (×3): 75 mg via ORAL
  Filled 2019-01-29 (×3): qty 1

## 2019-01-29 MED ORDER — HYDROCHLOROTHIAZIDE 25 MG PO TABS
12.5000 mg | ORAL_TABLET | Freq: Every day | ORAL | Status: DC
  Administered 2019-01-29 – 2019-01-31 (×3): 12.5 mg via ORAL
  Filled 2019-01-29 (×3): qty 1

## 2019-01-29 MED ORDER — POTASSIUM CHLORIDE CRYS ER 20 MEQ PO TBCR
20.0000 meq | EXTENDED_RELEASE_TABLET | Freq: Two times a day (BID) | ORAL | Status: AC
  Administered 2019-01-29 (×2): 20 meq via ORAL
  Filled 2019-01-29 (×2): qty 1

## 2019-01-29 NOTE — Progress Notes (Signed)
PHARMACIST - PHYSICIAN COMMUNICATION  DR:   Leonie Man  CONCERNING: IV to Oral Route Change Policy  RECOMMENDATION: This patient is receiving Pantoprazole by the intravenous route.  Based on criteria approved by the Pharmacy and Therapeutics Committee, the intravenous medication(s) is/are being converted to the equivalent oral dose form(s).   DESCRIPTION: These criteria include:  The patient is eating (either orally or via tube) and/or has been taking other orally administered medications for a least 24 hours  The patient has no evidence of active gastrointestinal bleeding or impaired GI absorption (gastrectomy, short bowel, patient on TNA or NPO).  If you have questions about this conversion, please contact the Pharmacy Department  []   [phone] )  [facility] []   [phone] )  [facility] [x]   [phone] )  [facility] []   [phone] )  [facility] []   [phone] )  [place], PharmD, [place] Clinical Pharmacist Please see AMION for all Pharmacists' Contact Phone Numbers 01/29/2019, 9:51 AM

## 2019-01-29 NOTE — Progress Notes (Signed)
Pt transferred from 4N ICU, S/P stroke and tPa, pt alert to self only but follows simple command, settled in bed with call light and family at bedside, tele monitor put and verified on pt, bed alarm activated ,pt and family educated, was however reassured and will continue to monitor. [place]-Asidi, Anu Stagner Efe

## 2019-01-29 NOTE — Progress Notes (Signed)
  Echocardiogram 2D Echocardiogram has been performed.  Samuel Shelton 01/29/2019, 1:40 PM

## 2019-01-29 NOTE — Progress Notes (Addendum)
STROKE TEAM PROGRESS NOTE   INTERVAL HISTORY Transferred to the floor since yesterday. Speech improved but still dysarthric. Still with mild HP. From Group Home with recommendations for SNF.  Vitals:   01/29/19 0600 01/29/19 0608 01/29/19 0747 01/29/19 0800  BP: (!) 166/102 (!) 168/101 (!) 180/84   Pulse: (!) 49 (!) 54 (!) 45   Resp: 14 18 18    Temp:  97.8 F (36.6 C) 97.8 F (36.6 C)   TempSrc:  Oral    SpO2: 100% 100% 100%   Weight:  93 kg  93 kg  Height:  6\' 2"  (1.88 m)  6\' 2"  (1.88 m)    CBC:  Recent Labs  Lab 01/27/19 2345  WBC 4.5  NEUTROABS 2.1  HGB 12.6*  HCT 38.8*  MCV 88.0  PLT 976    Basic Metabolic Panel:  Recent Labs  Lab 01/27/19 2345  NA 136  K 3.3*  CL 102  CO2 24  GLUCOSE 96  BUN 18  CREATININE 1.18  CALCIUM 8.9   Lipid Panel:     Component Value Date/Time   CHOL 160 01/28/2019 0155   TRIG 54 01/28/2019 0155   HDL 53 01/28/2019 0155   CHOLHDL 3.0 01/28/2019 0155   VLDL 11 01/28/2019 0155   LDLCALC 96 01/28/2019 0155   HgbA1c:  Lab Results  Component Value Date   HGBA1C 5.6 01/28/2019   Urine Drug Screen: No results found for: LABOPIA, COCAINSCRNUR, LABBENZ, AMPHETMU, THCU, LABBARB  Alcohol Level     Component Value Date/Time   ETH <10 01/27/2019 2345    IMAGING Ct Angio Head W Or Wo Contrast  Result Date: 01/28/2019 CLINICAL DATA:  RIGHT facial droop. History of hypertension and mental retardation. EXAM: CT ANGIOGRAPHY HEAD AND NECK TECHNIQUE: Multidetector CT imaging of the head and neck was performed using the standard protocol during bolus administration of intravenous contrast. Multiplanar CT image reconstructions and MIPs were obtained to evaluate the vascular anatomy. Carotid stenosis measurements (when applicable) are obtained utilizing NASCET criteria, using the distal internal carotid diameter as the denominator. CONTRAST:  1102mL ISOVUE-370 IOPAMIDOL (ISOVUE-370) INJECTION 76% COMPARISON:  CT HEAD January 27, 2019 FINDINGS:  CTA NECK FINDINGS: AORTIC ARCH: 4.2 cm ascending aorta. Mild calcific atherosclerosis aortic arch. Two vessel origin is a normal variant. The origins of the innominate, left Common carotid artery and subclavian artery are patent. RIGHT CAROTID SYSTEM: Common carotid artery is patent, mild calcific atherosclerosis. Mild calcific atherosclerosis of the carotid bifurcation without hemodynamically significant stenosis by NASCET criteria. Patent internal carotid artery with mild luminal irregularity compatible with atherosclerosis. LEFT CAROTID SYSTEM: Common carotid artery is patent. Mild calcific atherosclerosis of the carotid bifurcation without hemodynamically significant stenosis by NASCET criteria. Patent internal carotid artery with mild luminal irregularity compatible with atherosclerosis. VERTEBRAL ARTERIES:Streak artifact obscures LEFT vertebral artery origin. Patent codominant vertebral arteries, mild luminal irregularity compatible with atherosclerosis. SKELETON: No acute osseous process though bone windows have not been submitted. Degenerative change of the cervical spine better demonstrated on today's dedicated CT. OTHER NECK: Soft tissues of the neck are nonacute though, not tailored for evaluation. UPPER CHEST: Included lung apices are clear. Mild centrilobular emphysema. No superior mediastinal lymphadenopathy. CTA HEAD FINDINGS: ANTERIOR CIRCULATION: Patent cervical internal carotid arteries, petrous, cavernous and supra clinoid internal carotid arteries. Moderate stenosis RIGHT cavernous ICA. Patent anterior communicating artery. Patent anterior and middle cerebral arteries. Moderate stenosis LEFT M2 origins. Moderate luminal irregularity anterior and middle cerebral arteries. No large vessel occlusion, flow-limiting stenosis, contrast extravasation  or aneurysm. POSTERIOR CIRCULATION: Patent vertebral arteries, vertebrobasilar junction and basilar artery, as well as main branch vessels. Calcific  atherosclerosis resulting in moderate stenosis RIGHT V4 segment. Patent posterior cerebral arteries, moderate luminal irregularity compatible with atherosclerosis. No large vessel occlusion, flow-limiting stenosis, contrast extravasation or aneurysm. VENOUS SINUSES: Major dural venous sinuses are patent though not tailored for evaluation on this angiographic examination. ANATOMIC VARIANTS: Supernumerary anterior cerebral artery. DELAYED PHASE: Not performed. MIP images reviewed. IMPRESSION: CTA NECK: 1. No hemodynamically significant stenosis ICA's. Patent vertebral arteries. 2. **An incidental finding of potential clinical significance has been found. 4.2 cm ascending aorta. Recommend annual imaging followup by CTA or MRA. This recommendation follows 2010 ACCF/AHA/AATS/ACR/ASA/SCA/SCAI/SIR/STS/SVM Guidelines for the Diagnosis and Management of Patients with Thoracic Aortic Disease. Circulation. 2010; 121: Y774-J287. Aortic aneurysm NOS (ICD10-I71.9)** CTA HEAD: 1. No emergent large vessel occlusion. Moderate stenosis LEFT M2 origin and RIGHT cavernous ICA. 2. Moderate intracranial atherosclerosis. Electronically Signed   By: Elon Alas M.D.   On: 01/28/2019 00:26   Ct Angio Neck W Or Wo Contrast  Result Date: 01/28/2019 CLINICAL DATA:  RIGHT facial droop. History of hypertension and mental retardation. EXAM: CT ANGIOGRAPHY HEAD AND NECK TECHNIQUE: Multidetector CT imaging of the head and neck was performed using the standard protocol during bolus administration of intravenous contrast. Multiplanar CT image reconstructions and MIPs were obtained to evaluate the vascular anatomy. Carotid stenosis measurements (when applicable) are obtained utilizing NASCET criteria, using the distal internal carotid diameter as the denominator. CONTRAST:  1104mL ISOVUE-370 IOPAMIDOL (ISOVUE-370) INJECTION 76% COMPARISON:  CT HEAD January 27, 2019 FINDINGS: CTA NECK FINDINGS: AORTIC ARCH: 4.2 cm ascending aorta. Mild calcific  atherosclerosis aortic arch. Two vessel origin is a normal variant. The origins of the innominate, left Common carotid artery and subclavian artery are patent. RIGHT CAROTID SYSTEM: Common carotid artery is patent, mild calcific atherosclerosis. Mild calcific atherosclerosis of the carotid bifurcation without hemodynamically significant stenosis by NASCET criteria. Patent internal carotid artery with mild luminal irregularity compatible with atherosclerosis. LEFT CAROTID SYSTEM: Common carotid artery is patent. Mild calcific atherosclerosis of the carotid bifurcation without hemodynamically significant stenosis by NASCET criteria. Patent internal carotid artery with mild luminal irregularity compatible with atherosclerosis. VERTEBRAL ARTERIES:Streak artifact obscures LEFT vertebral artery origin. Patent codominant vertebral arteries, mild luminal irregularity compatible with atherosclerosis. SKELETON: No acute osseous process though bone windows have not been submitted. Degenerative change of the cervical spine better demonstrated on today's dedicated CT. OTHER NECK: Soft tissues of the neck are nonacute though, not tailored for evaluation. UPPER CHEST: Included lung apices are clear. Mild centrilobular emphysema. No superior mediastinal lymphadenopathy. CTA HEAD FINDINGS: ANTERIOR CIRCULATION: Patent cervical internal carotid arteries, petrous, cavernous and supra clinoid internal carotid arteries. Moderate stenosis RIGHT cavernous ICA. Patent anterior communicating artery. Patent anterior and middle cerebral arteries. Moderate stenosis LEFT M2 origins. Moderate luminal irregularity anterior and middle cerebral arteries. No large vessel occlusion, flow-limiting stenosis, contrast extravasation or aneurysm. POSTERIOR CIRCULATION: Patent vertebral arteries, vertebrobasilar junction and basilar artery, as well as main branch vessels. Calcific atherosclerosis resulting in moderate stenosis RIGHT V4 segment. Patent  posterior cerebral arteries, moderate luminal irregularity compatible with atherosclerosis. No large vessel occlusion, flow-limiting stenosis, contrast extravasation or aneurysm. VENOUS SINUSES: Major dural venous sinuses are patent though not tailored for evaluation on this angiographic examination. ANATOMIC VARIANTS: Supernumerary anterior cerebral artery. DELAYED PHASE: Not performed. MIP images reviewed. IMPRESSION: CTA NECK: 1. No hemodynamically significant stenosis ICA's. Patent vertebral arteries. 2. **An incidental finding of potential clinical significance  has been found. 4.2 cm ascending aorta. Recommend annual imaging followup by CTA or MRA. This recommendation follows 2010 ACCF/AHA/AATS/ACR/ASA/SCA/SCAI/SIR/STS/SVM Guidelines for the Diagnosis and Management of Patients with Thoracic Aortic Disease. Circulation. 2010; 121: H702-O378. Aortic aneurysm NOS (ICD10-I71.9)** CTA HEAD: 1. No emergent large vessel occlusion. Moderate stenosis LEFT M2 origin and RIGHT cavernous ICA. 2. Moderate intracranial atherosclerosis. Electronically Signed   By: Elon Alas M.D.   On: 01/28/2019 00:26   Ct Cervical Spine Wo Contrast  Result Date: 01/28/2019 CLINICAL DATA:  Fall. RIGHT facial droop, last seen normal at 2245 hours. History of hypertension and mental retardation. EXAM: CT HEAD WITHOUT CONTRAST CT CERVICAL SPINE WITHOUT CONTRAST TECHNIQUE: Multidetector CT imaging of the head and cervical spine was performed following the standard protocol without intravenous contrast. Multiplanar CT image reconstructions of the cervical spine were also generated. COMPARISON:  CT HEAD August 08, 2018 FINDINGS: CT HEAD FINDINGS BRAIN: No intraparenchymal hemorrhage, mass effect nor midline shift. Moderate parenchymal brain volume loss for age. No hydrocephalus. Confluent supratentorial and patchy pontine white matter hypodensities. 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 No acute large vascular  territory infarcts. No abnormal extra-axial fluid collections. Basal cisterns are patent. VASCULAR: Trace calcific atherosclerosis of the carotid siphons. SKULL: No skull fracture. No significant scalp soft tissue swelling. Small RIGHT frontal scalp lipoma. SINUSES/ORBITS: Trace paranasal sinus mucosal thickening. Mastoid air cells are well aerated.The included ocular globes and orbital contents are non-suspicious. OTHER: N patient is edentulous one. ASPECTS [facility] Stroke Program Early CT Score) - Ganglionic level infarction (caudate, lentiform nuclei, internal capsule, insula, M1-M3 cortex): 7 - Supraganglionic infarction (M4-M6 cortex): 3 Total score (0-10 with 10 being normal): 10 CT CERVICAL SPINE FINDINGS ALIGNMENT: Straightened lordosis. Minimal grade 1 C3-4 retrolisthesis. Advanced RIGHT C2-3 facet arthropathy. SKULL BASE AND VERTEBRAE: Cervical vertebral bodies and posterior elements are intact. Severe C3-4, C6-7 and moderate C7-T1 disc height loss with endplate spurring compatible with degenerative discs. SOFT TISSUES AND SPINAL CANAL: Nonacute. Mild calcific atherosclerosis carotid bifurcations. DISC LEVELS: No high-grade osseous canal stenosis or neural foraminal narrowing. UPPER CHEST: Lung apices are clear. OTHER: None. IMPRESSION: CT HEAD: 1. No acute intracranial process. 2. ASPECTS is 10 3. 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 4. Moderate parenchymal brain volume loss. CT CERVICAL SPINE: 1. No fracture or malalignment. Critical Value/emergent results text paged to [place], Neurology via AMION secure system on 01/27/2019 at 12:01 am, including interpreting physician's phone number. Electronically Signed   By: Elon Alas M.D.   On: 01/28/2019 00:04   Mr Brain Wo Contrast  Result Date: 01/28/2019 CLINICAL DATA:  68 y/o  M; follow-up of stroke. EXAM: MRI HEAD WITHOUT CONTRAST TECHNIQUE: Multiplanar, multiecho pulse sequences of the brain and surrounding  structures were obtained without intravenous contrast. COMPARISON:  01/27/2019 CT head.  01/28/2009 T CTA head. FINDINGS: Brain: 16 mm focus of reduced diffusion within the left ventral thalamus and posterior limb of internal capsule (series 3, image 22) compatible with acute/early subacute infarction. Additional punctate foci of acute/early subacute infarction are present within the left hemi pons (series 4, image 21), right occipital periventricular white matter (series 4, image 25), and left posterior temporal periventricular white matter (series 4, image 27). No associated hemorrhage or mass effect. Confluent nonspecific T2 FLAIR hyperintensities in subcortical and periventricular white matter are compatible with severe chronic microvascular ischemic changes. Severe volume loss of the brain. Multiple small chronic infarctions are present within the basal ganglia and central  pons. Punctate foci of susceptibility hypointensity are present scattered throughout the brain consistent with hemosiderin deposition of chronic microhemorrhage. The distribution of chronic microhemorrhage is greater in the posterior and central distributions favoring sequelae of chronic hypertension. No extra-axial collection, hydrocephalus, or herniation. Vascular: Normal flow voids. Skull and upper cervical spine: Right frontal scalp lipoma noted. Sinuses/Orbits: Negative. Other: None. IMPRESSION: 1. 16 mm acute/early subacute infarction in left ventral thalamus and posterior limb of internal capsule. Additional punctate foci of infarction are present within left hemi pons, right occipital periventricular white matter, and left posterior temporal periventricular white matter. No hemorrhage or mass effect. 2. Advanced chronic microvascular ischemic changes and volume loss of the brain. Small chronic infarcts in basal ganglia and pons. These results will be called to the ordering clinician or representative by the Radiologist Assistant, and  communication documented in the PACS or zVision Dashboard. Electronically Signed   By: Kristine Garbe M.D.   On: 01/28/2019 15:50   Ct Head Code Stroke Wo Contrast  Result Date: 01/28/2019 CLINICAL DATA:  Fall. RIGHT facial droop, last seen normal at 2245 hours. History of hypertension and mental retardation. EXAM: CT HEAD WITHOUT CONTRAST CT CERVICAL SPINE WITHOUT CONTRAST TECHNIQUE: Multidetector CT imaging of the head and cervical spine was performed following the standard protocol without intravenous contrast. Multiplanar CT image reconstructions of the cervical spine were also generated. COMPARISON:  CT HEAD August 08, 2018 FINDINGS: CT HEAD FINDINGS BRAIN: No intraparenchymal hemorrhage, mass effect nor midline shift. Moderate parenchymal brain volume loss for age. No hydrocephalus. Confluent supratentorial and patchy pontine white matter hypodensities. 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 No acute large vascular territory infarcts. No abnormal extra-axial fluid collections. Basal cisterns are patent. VASCULAR: Trace calcific atherosclerosis of the carotid siphons. SKULL: No skull fracture. No significant scalp soft tissue swelling. Small RIGHT frontal scalp lipoma. SINUSES/ORBITS: Trace paranasal sinus mucosal thickening. Mastoid air cells are well aerated.The included ocular globes and orbital contents are non-suspicious. OTHER: N patient is edentulous one. ASPECTS [facility] Stroke Program Early CT Score) - Ganglionic level infarction (caudate, lentiform nuclei, internal capsule, insula, M1-M3 cortex): 7 - Supraganglionic infarction (M4-M6 cortex): 3 Total score (0-10 with 10 being normal): 10 CT CERVICAL SPINE FINDINGS ALIGNMENT: Straightened lordosis. Minimal grade 1 C3-4 retrolisthesis. Advanced RIGHT C2-3 facet arthropathy. SKULL BASE AND VERTEBRAE: Cervical vertebral bodies and posterior elements are intact. Severe C3-4, C6-7 and moderate C7-T1 disc height loss with  endplate spurring compatible with degenerative discs. SOFT TISSUES AND SPINAL CANAL: Nonacute. Mild calcific atherosclerosis carotid bifurcations. DISC LEVELS: No high-grade osseous canal stenosis or neural foraminal narrowing. UPPER CHEST: Lung apices are clear. OTHER: None. IMPRESSION: CT HEAD: 1. No acute intracranial process. 2. ASPECTS is 10 3. 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 4. Moderate parenchymal brain volume loss. CT CERVICAL SPINE: 1. No fracture or malalignment. Critical Value/emergent results text paged to [place], Neurology via AMION secure system on 01/27/2019 at 12:01 am, including interpreting physician's phone number. Electronically Signed   By: Elon Alas M.D.   On: 01/28/2019 00:04    PHYSICAL EXAM  middle aged african [place] male not in distress. . Afebrile. Head is nontraumatic. Neck is supple without bruit.    Cardiac exam no murmur or gallop. Lungs are clear to auscultation. Distal pulses are well felt.  Neurological Exam ;  Awake alert  Less severly dysarthric speech that yesterday, easier to understand .follows simple midline and occasional one-step commands only. Extraocular moments  are full range without nystagmus. Blinks to threat bilaterally. Mild right lower facial asymmetry. Tongue midline. Motor system exam limited due to patient cooperation but able to move all 4 extremities against gravity but   has mild right-sided weakness though cooperation is limited. Sensory exam and coordination cannot be reliably tested. Deep tendon pulses are symmetric. Plantars are both downgoing. Gait not tested.   ASSESSMENT/PLAN [name] is a 68 y.o. male with history of MR and HTN presenting with R hemiparesis s/p IV tPA 01/28/2019 at [place].  Stroke: L basal ganglia, R parietal white matter, R occipital,  and L pontine infarcts - all due to small vessel disease - TIA s/p IV tPA.  Code Stroke CT head No acute stroke. Old lacunes.  Small vessel disease. Atrophy. ASPECTS 10.    CT CSpine no fx or malalignment   CTA head no ELVO. Mod stenosis L M2 and R ICA  CTA neck no sign stenosis.  4.2 cm ascending aorta  MRI  L BG (thalamus/PLIC), R occipital, R parietal WM and L pontine infarct . Advanced small vessel disease. Atrophy.  Old BG and pons infarcts  2D Echo  EF 55-60%. Concentric left ventricular hyp  EEG no sz. Global encephalopathy  LDL 96  HgbA1c 5.6  SCDs for VTE prophylaxis  No antithrombotic prior to admission, now on aspirin 81 mg daily started last night. Given mild stroke, recommend aspirin 81 mg and plavix 75 mg daily x 3 weeks, then aspirin alone. Orders adjusted.   Disposition:  SNF - admitted from a group home, niece is POA  Medically ready for d/c. For SNF vs return to group home with home health therapies. Discussed with Care management.   Hypertension  Stable . SBP goal now per standard guidelines < 220/110 . Home meds:  hctz . Resume home meds . Long-term BP goal normotensive  Hyperlipidemia  Home meds:  No statin  LDL 96, goal < 70  Added liipitor 40  Continue statin at discharge  Other Stroke Risk Factors  Advanced age  Former Cigarette smoker  Family hx stroke (mother)  Other Active Problems  Baseline MR  BPH on flomax  Hypokalemia K 3.3 - supplement - recheck  Hospital day # [place], MSN, APRN, ANVP-BC, AGPCNP-BC Advanced Practice Stroke Nurse [place] for Schedule & Pager information 01/29/2019 10:42 AM  I have personally obtained history,examined this patient, reviewed notes, independently viewed imaging studies, participated in medical decision making and plan of care.ROS completed by me personally and pertinent positives fully documented  I have made any additions or clarifications directly to the above note. Agree with note above. Continue aspirin and Plavix for 3 weeks to skilled nursing facility for rehabilitation in  Few days when bed available. Greater than 50% time during this 56 mi counseling and coordination of care about  And discussion with care team  Antony Contras, MD Medical Director [place] Pager: [phone] 01/29/2019 3:23 PM  To contact Stroke Continuity provider, please refer to [email or web address] After hours, contact General Neurology

## 2019-01-29 NOTE — Discharge Summary (Addendum)
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 Chronic mental illness   . Edema of both legs   . Elevated PSA   . Elevated PSA   . Hypertension   . Mental retardation   . Phimosis    Past Surgical History:  Procedure Laterality Date  . PROSTATE BIOPSY N/A 06/22/2015   Procedure: PROSTATE BIOPSY;  Surgeon: Hollice Espy, MD;  Location: [facility] ORS;  Service: Urology;  Laterality: N/A;  . PROSTATE BIOPSY N/A 09/27/2016   Procedure: PROSTATE BIOPSY;  Surgeon: Hollice Espy, MD;  Location: [facility] ORS;  Service: Urology;  Laterality: N/A;    Allergies as of 01/31/2019   No Known Allergies     Medication List    TAKE these medications    stroke: mapping our early stages of recovery book Misc 1 each by Does not apply route once for 1 dose.   acetaminophen 500 MG tablet Commonly known as:  TYLENOL Take 500 mg by mouth every 4 (four) hours as needed for mild pain.   aspirin 81 MG chewable tablet Chew 1 tablet (81 mg total) by mouth daily. Start taking on:  February 01, 2019   atorvastatin 40 MG tablet Commonly known as:  LIPITOR Take 1 tablet (40 mg total) by mouth daily at 6 PM.   clopidogrel 75 MG tablet Commonly known as:  PLAVIX Take 1 tablet (75 mg total) by mouth daily. Start taking on:  February 01, 2019   finasteride 5 MG tablet Commonly known as:  PROSCAR Take 1 tablet (5 mg total) by mouth daily.   guaifenesin  100 MG/5ML syrup Commonly known as:  ROBITUSSIN Take 200 mg by mouth 3 (three) times daily as needed for cough.   hydrochlorothiazide 12.5 MG tablet Commonly known as:  HYDRODIURIL Take 12.5 mg by mouth.   loperamide 2 MG capsule Commonly known as:  IMODIUM Take 2 mg by mouth daily as needed for diarrhea or loose stools.   magnesium hydroxide 400 MG/5ML suspension Commonly known as:  MILK OF MAGNESIA Take 30 mLs by mouth at bedtime as needed for mild constipation.   [place] 200-200-20 MG/5ML suspension Generic drug:  alum & mag hydroxide-simeth Take 30 mLs by mouth every 6 (six) hours as needed for indigestion or heartburn.   neomycin-bacitracin-polymyxin Oint Commonly known as:  NEOSPORIN Apply 1 application topically daily as needed for irritation or wound care.   tamsulosin 0.4 MG Caps capsule Commonly known as:  FLOMAX Take 1 capsule (0.4 mg total) by mouth daily.       LABORATORY STUDIES CBC    Component Value Date/Time   WBC 4.5 01/27/2019 2345   RBC 4.41 01/27/2019 2345   HGB 12.6 (L) 01/27/2019 2345   HCT 38.8 (L) 01/27/2019 2345   PLT 202 01/27/2019 2345   MCV 88.0 01/27/2019 2345   MCH 28.6 01/27/2019 2345   MCHC 32.5 01/27/2019 2345  RDW 13.7 01/27/2019 2345   LYMPHSABS 1.8 01/27/2019 2345   MONOABS 0.5 01/27/2019 2345   EOSABS 0.1 01/27/2019 2345   BASOSABS 0.0 01/27/2019 2345   CMP    Component Value Date/Time   NA 136 01/27/2019 2345   K 3.3 (L) 01/27/2019 2345   CL 102 01/27/2019 2345   CO2 24 01/27/2019 2345   GLUCOSE 96 01/27/2019 2345   BUN 18 01/27/2019 2345   CREATININE 1.18 01/27/2019 2345   CALCIUM 8.9 01/27/2019 2345   PROT 6.3 (L) 01/27/2019 2345   ALBUMIN 3.2 (L) 01/27/2019 2345   AST 15 01/27/2019 2345   ALT 12 01/27/2019 2345   ALKPHOS 38 01/27/2019 2345   BILITOT 0.6 01/27/2019 2345   GFRNONAA >60 01/27/2019 2345   GFRAA >60 01/27/2019 2345   COAGS Lab Results  Component Value Date   INR 1.06 01/27/2019   Lipid  Panel    Component Value Date/Time   CHOL 160 01/28/2019 0155   TRIG 54 01/28/2019 0155   HDL 53 01/28/2019 0155   CHOLHDL 3.0 01/28/2019 0155   VLDL 11 01/28/2019 0155   LDLCALC 96 01/28/2019 0155   HgbA1C  Lab Results  Component Value Date   HGBA1C 5.6 01/28/2019   Urinalysis    Component Value Date/Time   COLORURINE YELLOW (A) 08/09/2018 0216   APPEARANCEUR CLEAR (A) 08/09/2018 0216   APPEARANCEUR Clear 03/02/2018 0937   LABSPEC 1.018 08/09/2018 0216   PHURINE 7.0 08/09/2018 0216   GLUCOSEU NEGATIVE 08/09/2018 0216   HGBUR NEGATIVE 08/09/2018 0216   BILIRUBINUR NEGATIVE 08/09/2018 0216   BILIRUBINUR Negative 03/02/2018 0937   KETONESUR NEGATIVE 08/09/2018 0216   PROTEINUR NEGATIVE 08/09/2018 0216   NITRITE NEGATIVE 08/09/2018 0216   LEUKOCYTESUR NEGATIVE 08/09/2018 0216   LEUKOCYTESUR Negative 03/02/2018 0937   Urine Drug Screen No results found for: LABOPIA, COCAINSCRNUR, LABBENZ, AMPHETMU, THCU, LABBARB  Alcohol Level    Component Value Date/Time   ETH <10 01/27/2019 2345     SIGNIFICANT DIAGNOSTIC STUDIES Ct Angio Head W Or Wo Contrast  Result Date: 01/28/2019 CLINICAL DATA:  RIGHT facial droop. History of hypertension and mental retardation. EXAM: CT ANGIOGRAPHY HEAD AND NECK TECHNIQUE: Multidetector CT imaging of the head and neck was performed using the standard protocol during bolus administration of intravenous contrast. Multiplanar CT image reconstructions and MIPs were obtained to evaluate the vascular anatomy. Carotid stenosis measurements (when applicable) are obtained utilizing NASCET criteria, using the distal internal carotid diameter as the denominator. CONTRAST:  115mL ISOVUE-370 IOPAMIDOL (ISOVUE-370) INJECTION 76% COMPARISON:  CT HEAD January 27, 2019 FINDINGS: CTA NECK FINDINGS: AORTIC ARCH: 4.2 cm ascending aorta. Mild calcific atherosclerosis aortic arch. Two vessel origin is a normal variant. The origins of the innominate, left Common carotid  artery and subclavian artery are patent. RIGHT CAROTID SYSTEM: Common carotid artery is patent, mild calcific atherosclerosis. Mild calcific atherosclerosis of the carotid bifurcation without hemodynamically significant stenosis by NASCET criteria. Patent internal carotid artery with mild luminal irregularity compatible with atherosclerosis. LEFT CAROTID SYSTEM: Common carotid artery is patent. Mild calcific atherosclerosis of the carotid bifurcation without hemodynamically significant stenosis by NASCET criteria. Patent internal carotid artery with mild luminal irregularity compatible with atherosclerosis. VERTEBRAL ARTERIES:Streak artifact obscures LEFT vertebral artery origin. Patent codominant vertebral arteries, mild luminal irregularity compatible with atherosclerosis. SKELETON: No acute osseous process though bone windows have not been submitted. Degenerative change of the cervical spine better demonstrated on today's dedicated CT. OTHER NECK: Soft tissues of the neck are nonacute though, not  tailored for evaluation. UPPER CHEST: Included lung apices are clear. Mild centrilobular emphysema. No superior mediastinal lymphadenopathy. CTA HEAD FINDINGS: ANTERIOR CIRCULATION: Patent cervical internal carotid arteries, petrous, cavernous and supra clinoid internal carotid arteries. Moderate stenosis RIGHT cavernous ICA. Patent anterior communicating artery. Patent anterior and middle cerebral arteries. Moderate stenosis LEFT M2 origins. Moderate luminal irregularity anterior and middle cerebral arteries. No large vessel occlusion, flow-limiting stenosis, contrast extravasation or aneurysm. POSTERIOR CIRCULATION: Patent vertebral arteries, vertebrobasilar junction and basilar artery, as well as main branch vessels. Calcific atherosclerosis resulting in moderate stenosis RIGHT V4 segment. Patent posterior cerebral arteries, moderate luminal irregularity compatible with atherosclerosis. No large vessel occlusion,  flow-limiting stenosis, contrast extravasation or aneurysm. VENOUS SINUSES: Major dural venous sinuses are patent though not tailored for evaluation on this angiographic examination. ANATOMIC VARIANTS: Supernumerary anterior cerebral artery. DELAYED PHASE: Not performed. MIP images reviewed. IMPRESSION: CTA NECK: 1. No hemodynamically significant stenosis ICA's. Patent vertebral arteries. 2. **An incidental finding of potential clinical significance has been found. 4.2 cm ascending aorta. Recommend annual imaging followup by CTA or MRA. This recommendation follows 2010 ACCF/AHA/AATS/ACR/ASA/SCA/SCAI/SIR/STS/SVM Guidelines for the Diagnosis and Management of Patients with Thoracic Aortic Disease. Circulation. 2010; 121: A416-S063. Aortic aneurysm NOS (ICD10-I71.9)** CTA HEAD: 1. No emergent large vessel occlusion. Moderate stenosis LEFT M2 origin and RIGHT cavernous ICA. 2. Moderate intracranial atherosclerosis. Electronically Signed   By: Elon Alas M.D.   On: 01/28/2019 00:26   Ct Angio Neck W Or Wo Contrast  Result Date: 01/28/2019 CLINICAL DATA:  RIGHT facial droop. History of hypertension and mental retardation. EXAM: CT ANGIOGRAPHY HEAD AND NECK TECHNIQUE: Multidetector CT imaging of the head and neck was performed using the standard protocol during bolus administration of intravenous contrast. Multiplanar CT image reconstructions and MIPs were obtained to evaluate the vascular anatomy. Carotid stenosis measurements (when applicable) are obtained utilizing NASCET criteria, using the distal internal carotid diameter as the denominator. CONTRAST:  159mL ISOVUE-370 IOPAMIDOL (ISOVUE-370) INJECTION 76% COMPARISON:  CT HEAD January 27, 2019 FINDINGS: CTA NECK FINDINGS: AORTIC ARCH: 4.2 cm ascending aorta. Mild calcific atherosclerosis aortic arch. Two vessel origin is a normal variant. The origins of the innominate, left Common carotid artery and subclavian artery are patent. RIGHT CAROTID SYSTEM: Common  carotid artery is patent, mild calcific atherosclerosis. Mild calcific atherosclerosis of the carotid bifurcation without hemodynamically significant stenosis by NASCET criteria. Patent internal carotid artery with mild luminal irregularity compatible with atherosclerosis. LEFT CAROTID SYSTEM: Common carotid artery is patent. Mild calcific atherosclerosis of the carotid bifurcation without hemodynamically significant stenosis by NASCET criteria. Patent internal carotid artery with mild luminal irregularity compatible with atherosclerosis. VERTEBRAL ARTERIES:Streak artifact obscures LEFT vertebral artery origin. Patent codominant vertebral arteries, mild luminal irregularity compatible with atherosclerosis. SKELETON: No acute osseous process though bone windows have not been submitted. Degenerative change of the cervical spine better demonstrated on today's dedicated CT. OTHER NECK: Soft tissues of the neck are nonacute though, not tailored for evaluation. UPPER CHEST: Included lung apices are clear. Mild centrilobular emphysema. No superior mediastinal lymphadenopathy. CTA HEAD FINDINGS: ANTERIOR CIRCULATION: Patent cervical internal carotid arteries, petrous, cavernous and supra clinoid internal carotid arteries. Moderate stenosis RIGHT cavernous ICA. Patent anterior communicating artery. Patent anterior and middle cerebral arteries. Moderate stenosis LEFT M2 origins. Moderate luminal irregularity anterior and middle cerebral arteries. No large vessel occlusion, flow-limiting stenosis, contrast extravasation or aneurysm. POSTERIOR CIRCULATION: Patent vertebral arteries, vertebrobasilar junction and basilar artery, as well as main branch vessels. Calcific atherosclerosis resulting in moderate stenosis RIGHT V4 segment.  Patent posterior cerebral arteries, moderate luminal irregularity compatible with atherosclerosis. No large vessel occlusion, flow-limiting stenosis, contrast extravasation or aneurysm. VENOUS  SINUSES: Major dural venous sinuses are patent though not tailored for evaluation on this angiographic examination. ANATOMIC VARIANTS: Supernumerary anterior cerebral artery. DELAYED PHASE: Not performed. MIP images reviewed. IMPRESSION: CTA NECK: 1. No hemodynamically significant stenosis ICA's. Patent vertebral arteries. 2. **An incidental finding of potential clinical significance has been found. 4.2 cm ascending aorta. Recommend annual imaging followup by CTA or MRA. This recommendation follows 2010 ACCF/AHA/AATS/ACR/ASA/SCA/SCAI/SIR/STS/SVM Guidelines for the Diagnosis and Management of Patients with Thoracic Aortic Disease. Circulation. 2010; 121: H607-P710. Aortic aneurysm NOS (ICD10-I71.9)** CTA HEAD: 1. No emergent large vessel occlusion. Moderate stenosis LEFT M2 origin and RIGHT cavernous ICA. 2. Moderate intracranial atherosclerosis. Electronically Signed   By: Elon Alas M.D.   On: 01/28/2019 00:26   Ct Cervical Spine Wo Contrast  Result Date: 01/28/2019 CLINICAL DATA:  Fall. RIGHT facial droop, last seen normal at 2245 hours. History of hypertension and mental retardation. EXAM: CT HEAD WITHOUT CONTRAST CT CERVICAL SPINE WITHOUT CONTRAST TECHNIQUE: Multidetector CT imaging of the head and cervical spine was performed following the standard protocol without intravenous contrast. Multiplanar CT image reconstructions of the cervical spine were also generated. COMPARISON:  CT HEAD August 08, 2018 FINDINGS: CT HEAD FINDINGS BRAIN: No intraparenchymal hemorrhage, mass effect nor midline shift. Moderate parenchymal brain volume loss for age. No hydrocephalus. Confluent supratentorial and patchy pontine white matter hypodensities. 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 No acute large vascular territory infarcts. No abnormal extra-axial fluid collections. Basal cisterns are patent. VASCULAR: Trace calcific atherosclerosis of the carotid siphons. SKULL: No skull fracture. No  significant scalp soft tissue swelling. Small RIGHT frontal scalp lipoma. SINUSES/ORBITS: Trace paranasal sinus mucosal thickening. Mastoid air cells are well aerated.The included ocular globes and orbital contents are non-suspicious. OTHER: N patient is edentulous one. ASPECTS [facility] Stroke Program Early CT Score) - Ganglionic level infarction (caudate, lentiform nuclei, internal capsule, insula, M1-M3 cortex): 7 - Supraganglionic infarction (M4-M6 cortex): 3 Total score (0-10 with 10 being normal): 10 CT CERVICAL SPINE FINDINGS ALIGNMENT: Straightened lordosis. Minimal grade 1 C3-4 retrolisthesis. Advanced RIGHT C2-3 facet arthropathy. SKULL BASE AND VERTEBRAE: Cervical vertebral bodies and posterior elements are intact. Severe C3-4, C6-7 and moderate C7-T1 disc height loss with endplate spurring compatible with degenerative discs. SOFT TISSUES AND SPINAL CANAL: Nonacute. Mild calcific atherosclerosis carotid bifurcations. DISC LEVELS: No high-grade osseous canal stenosis or neural foraminal narrowing. UPPER CHEST: Lung apices are clear. OTHER: None. IMPRESSION: CT HEAD: 1. No acute intracranial process. 2. ASPECTS is 10 3. 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 4. Moderate parenchymal brain volume loss. CT CERVICAL SPINE: 1. No fracture or malalignment. Critical Value/emergent results text paged to [place], Neurology via AMION secure system on 01/27/2019 at 12:01 am, including interpreting physician's phone number. Electronically Signed   By: Elon Alas M.D.   On: 01/28/2019 00:04   Mr Brain Wo Contrast  Result Date: 01/28/2019 CLINICAL DATA:  68 y/o  M; follow-up of stroke. EXAM: MRI HEAD WITHOUT CONTRAST TECHNIQUE: Multiplanar, multiecho pulse sequences of the brain and surrounding structures were obtained without intravenous contrast. COMPARISON:  01/27/2019 CT head.  01/28/2009 T CTA head. FINDINGS: Brain: 16 mm focus of reduced diffusion within the left  ventral thalamus and posterior limb of internal capsule (series 3, image 22) compatible with acute/early subacute infarction. Additional punctate foci of acute/early subacute infarction are present within  the left hemi pons (series 4, image 21), right occipital periventricular white matter (series 4, image 25), and left posterior temporal periventricular white matter (series 4, image 27). No associated hemorrhage or mass effect. Confluent nonspecific T2 FLAIR hyperintensities in subcortical and periventricular white matter are compatible with severe chronic microvascular ischemic changes. Severe volume loss of the brain. Multiple small chronic infarctions are present within the basal ganglia and central pons. Punctate foci of susceptibility hypointensity are present scattered throughout the brain consistent with hemosiderin deposition of chronic microhemorrhage. The distribution of chronic microhemorrhage is greater in the posterior and central distributions favoring sequelae of chronic hypertension. No extra-axial collection, hydrocephalus, or herniation. Vascular: Normal flow voids. Skull and upper cervical spine: Right frontal scalp lipoma noted. Sinuses/Orbits: Negative. Other: None. IMPRESSION: 1. 16 mm acute/early subacute infarction in left ventral thalamus and posterior limb of internal capsule. Additional punctate foci of infarction are present within left hemi pons, right occipital periventricular white matter, and left posterior temporal periventricular white matter. No hemorrhage or mass effect. 2. Advanced chronic microvascular ischemic changes and volume loss of the brain. Small chronic infarcts in basal ganglia and pons. These results will be called to the ordering clinician or representative by the Radiologist Assistant, and communication documented in the PACS or zVision Dashboard. Electronically Signed   By: Kristine Garbe M.D.   On: 01/28/2019 15:50   Ct Head Code Stroke Wo  Contrast  Result Date: 01/28/2019 CLINICAL DATA:  Fall. RIGHT facial droop, last seen normal at 2245 hours. History of hypertension and mental retardation. EXAM: CT HEAD WITHOUT CONTRAST CT CERVICAL SPINE WITHOUT CONTRAST TECHNIQUE: Multidetector CT imaging of the head and cervical spine was performed following the standard protocol without intravenous contrast. Multiplanar CT image reconstructions of the cervical spine were also generated. COMPARISON:  CT HEAD August 08, 2018 FINDINGS: CT HEAD FINDINGS BRAIN: No intraparenchymal hemorrhage, mass effect nor midline shift. Moderate parenchymal brain volume loss for age. No hydrocephalus. Confluent supratentorial and patchy pontine white matter hypodensities. 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 No acute large vascular territory infarcts. No abnormal extra-axial fluid collections. Basal cisterns are patent. VASCULAR: Trace calcific atherosclerosis of the carotid siphons. SKULL: No skull fracture. No significant scalp soft tissue swelling. Small RIGHT frontal scalp lipoma. SINUSES/ORBITS: Trace paranasal sinus mucosal thickening. Mastoid air cells are well aerated.The included ocular globes and orbital contents are non-suspicious. OTHER: N patient is edentulous one. ASPECTS [facility] Stroke Program Early CT Score) - Ganglionic level infarction (caudate, lentiform nuclei, internal capsule, insula, M1-M3 cortex): 7 - Supraganglionic infarction (M4-M6 cortex): 3 Total score (0-10 with 10 being normal): 10 CT CERVICAL SPINE FINDINGS ALIGNMENT: Straightened lordosis. Minimal grade 1 C3-4 retrolisthesis. Advanced RIGHT C2-3 facet arthropathy. SKULL BASE AND VERTEBRAE: Cervical vertebral bodies and posterior elements are intact. Severe C3-4, C6-7 and moderate C7-T1 disc height loss with endplate spurring compatible with degenerative discs. SOFT TISSUES AND SPINAL CANAL: Nonacute. Mild calcific atherosclerosis carotid bifurcations. DISC LEVELS: No  high-grade osseous canal stenosis or neural foraminal narrowing. UPPER CHEST: Lung apices are clear. OTHER: None. IMPRESSION: CT HEAD: 1. No acute intracranial process. 2. ASPECTS is 10 3. 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 4. Moderate parenchymal brain volume loss. CT CERVICAL SPINE: 1. No fracture or malalignment. Critical Value/emergent results text paged to [place], Neurology via AMION secure system on 01/27/2019 at 12:01 am, including interpreting physician's phone number. Electronically Signed   By: Elon Alas M.D.   On: 01/28/2019  00:04    2D Echocardiogram   1. The left ventricle has normal systolic function of 27-74%. The cavity size is normal. There is concentric left ventricular hypertrophy. Left ventricular diastology could not be evaluated due to indeterminent diastolic function.  2. The right ventricle has normal systolic function. The cavity in normal in size. There is no increase in right ventricular wall thickness.  3. The mitral valve is normal in structure There is mild thickening and mild calcification of the anterior and posterior leaflets.  4. The aortic valve is tricuspid. There is mild sclerosis of the aortic valve.     HISTORY OF PRESENT ILLNESS Samuel Shelton is an 68 y.o. male past medical history of mental retardation and HTN who lives in an assisted living facility presents to the ER as a code stroke for right-sided weakness. He was last seen normal by nursing staff at 10:45 PM on 01/27/2019.  Around 11 PM staff noticed patient was not moving the right side and had hit his head and notified EMS. Patient was answering questions intermittently noted to have right facial droop and right side was plegic although started to improve on his way to the emergency room. SBP 150, glucose was 87. NIHSS: 14. Baseline MRS 2-3. IV tPA was administered and he was admitted to the ICU for further evaluation. Stroke wk up completed and he is stable  and ready for d/c to [place] [name] is a 68 y.o. male with history of MR and HTN presenting with R hemiparesis s/p IV tPA 01/28/2019 at [facility].  Stroke: Multiple white matter infarcts (L basal ganglia, R parietal white matter, R occipital,  L pontine infarcts) - all due to small vessel disease - TIA s/p IV tPA.  Code Stroke CT head No acute stroke. Old lacunes. Small vessel disease. Atrophy. ASPECTS 10.    CT CSpine no fx or malalignment   CTA head no ELVO. Mod stenosis L M2 and R ICA  CTA neck no sign stenosis.  4.2 cm ascending aorta  MRI  L BG (thalamus/PLIC), R occipital, R parietal WM and L pontine infarct . Advanced small vessel disease. Atrophy.  Old BG and pons infarcts  2D Echo  EF 55-60%. Concentric left ventricular hypertrophy  EEG no sz. Global encephalopathy  LDL 96  HgbA1c 5.6  No antithrombotic prior to admission, now on aspirin 81 mg daily started last night. Given mild stroke, recommend aspirin 81 mg and plavix 75 mg daily x 3 weeks, then aspirin alone.   Disposition:  SNF - admitted from a group home, niece is POA  Hypertension  Stable  SBP goal now per standard guidelines < 220/110  Home meds:  hctz  Resume home meds  Long-term BP goal normotensive  Hyperlipidemia  Home meds:  No statin  LDL 96, goal < 70  Added liipitor 40  Continue statin at discharge  Other Stroke Risk Factors  Advanced age  Former Cigarette smoker  Family hx stroke (mother)  Other Active Problems  Baseline MR  BPH on flomax  Hypokalemia K 3.3 - supplemented, now 3.5   DISCHARGE EXAM Blood pressure (!) 160/99, pulse (!) 53, temperature 98 F (36.7 C), resp. rate 16, height 6\' 2"  (1.88 m), weight 93 kg, SpO2 98 %. Stable, no distress, middle aged african [place] male not in distress. . Afebrile. Head is nontraumatic. Neck is supple without bruit.    Cardiac exam no murmur or gallop. Lungs are clear to auscultation. Distal pulses  are well felt.  Neurological Exam ;  Awake alert  Less severly dysarthric speech that yesterday, easier to understand .follows simple midline and occasional one-step commands only. Extraocular moments are full range without nystagmus. Blinks to threat bilaterally. Mild right lower facial asymmetry. Tongue midline. Motor system exam limited due to patient cooperation but able to move all 4 extremities against gravity but   has mild right-sided weakness though cooperation is limited. Sensory exam and coordination cannot be reliably tested. Deep tendon pulses are symmetric. Plantars are both downgoing. Gait not tested.  Discharge Diet   Dysphagia 2 thin liquids  DISCHARGE PLAN  Disposition:  Stable, ready for d/c to snf  aspirin 81 mg and plavix 75 mg daily x 3 weeks, then aspirin alone for secondary stroke prevention.  Ongoing risk factor control by Primary Care Physician at time of discharge  Follow-up Feldpausch, Chrissie Noa, MD in 2 weeks.  Follow-up in [facility] in 4 weeks, office to schedule an appointment.   33 minutes were spent preparing discharge.  Desiree Metzger-Cihelka, ARNP-C, ANVP-BC I have personally obtained history,examined this patient, reviewed notes, independently viewed imaging studies, participated in medical decision making and plan of care.ROS completed by me personally and pertinent positives fully documented  I have made any additions or clarifications directly to the above note. Agree with note above.    Antony Contras, MD Medical Director [facility] Pager: [phone] 01/31/2019 4:39 PM

## 2019-01-29 NOTE — Clinical Social Work Note (Signed)
Clinical Social Work Assessment  Patient Details  Name: Samuel Shelton MRN: [id number] Date of Birth: 05-Mar-1951  Date of referral:  01/29/19               Reason for consult:  Facility Placement                Permission sought to share information with:    Permission granted to share information::     Name::        Agency::     Relationship::     Contact Information:     Housing/Transportation Living arrangements for the past 2 months:  Assisted Living Facility([facility] ) Source of Information:  Power of Attorney(Samuel Shelton) Patient Interpreter Needed:  None Criminal Activity/Legal Involvement Pertinent to Current Situation/Hospitalization:    Significant Relationships:  Siblings, Other Family Members Lives with:  Facility Resident Do you feel safe going back to the place where you live?  Yes Need for family participation in patient care:  Yes (Comment)  Care giving concerns: No care giving concerns at this time.    Social Worker assessment / plan:  CSW spoke with patients niece, [name] ([place]) [phone], via phone to determine disposition plans. Patient is a current resident at [facility]. CSW informed Samuel Shelton of PT's recommendation for SNF placement at discharge- Samuel Shelton voiced preferring patient to go to SNF placement at discharge due to him needing more physical therapy than [facility] can provide at this time. Samuel Shelton would prefer a facility in [place] due to her living in the area and she is patients primary care giver/ visitor. CSW informed Samuel Shelton department would seek SNF placement at this time.   CSW will continue to follow up with patient and [place], POA.   Employment status:  Disabled (Comment on whether or not currently receiving Disability) Insurance information:  Medicare, Medicaid In [place] PT Recommendations:    Information / Referral to community resources:  [place]  Patient/Family's Response to care: Patient POA,  Agricultural consultant, appreciated CSW.   Patient/Family's Understanding of and Emotional Response to Diagnosis, Current Treatment, and Prognosis:  POA aware of current plans and CSW following up with POA on SNF options.   Emotional Assessment Appearance:    Attitude/Demeanor/Rapport:    Affect (typically observed):  (Spoke with niece via phone ) Orientation:    Alcohol / Substance use:    Psych involvement (Current and /or in the community):  No (Comment)  Discharge Needs  Concerns to be addressed:  No discharge needs identified Readmission within the last 30 days:  No Current discharge risk:  None Barriers to Discharge:  No SNF bed   Weston Anna, LCSW 01/29/2019, 12:11 PM

## 2019-01-29 NOTE — NC FL2 (Addendum)
[place] LEVEL OF CARE SCREENING TOOL     IDENTIFICATION  Patient Name: Samuel Shelton Birthdate: 19-Oct-1951 Sex: male Admission Date (Current Location): 01/27/2019  [facility] and [place] Number:  Herbalist and Address:  [facility] [place] [address] [place], [place] [address]      Provider Number: [id number]  Attending Physician Name and Address:  Garvin Fila, MD  Relative Name and Phone Number:       Current Level of Care: Hospital Recommended Level of Care: [place] Prior Approval Number:    Date Approved/Denied:   PASRR Number:    Discharge Plan: Home    Current Diagnoses: Patient Active Problem List   Diagnosis Date Noted  . Hyperlipidemia 01/29/2019  . Family hx-stroke 01/29/2019  . BPH (benign prostatic hyperplasia) 01/29/2019  . 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 Edema leg 08/20/2014  . Chronic mental disorder 08/20/2014  . BP (high blood pressure) 08/20/2014  . Intellectual disability 08/20/2014    Orientation RESPIRATION BLADDER Height & Weight     Self, Time, Situation, Place  Normal Incontinent Weight: 205 lb 0.4 oz (93 kg) Height:  6\' 2"  (188 cm)  BEHAVIORAL SYMPTOMS/MOOD NEUROLOGICAL BOWEL NUTRITION STATUS        Diet(dc summary)  AMBULATORY STATUS COMMUNICATION OF NEEDS Skin   Limited Assist Verbally Normal                       Personal Care Assistance Level of Assistance  Bathing, Feeding, Dressing Bathing Assistance: Limited assistance Feeding assistance: Independent Dressing Assistance: Limited assistance     Functional Limitations Info             SPECIAL CARE FACTORS FREQUENCY  PT (By licensed PT), OT (By licensed OT)     PT Frequency: 5 OT Frequency: 5            Contractures      Additional Factors Info  Code Status, Allergies Code Status Info: Full code Allergies Info: NKA            Current Medications (01/29/2019):  This is the  current hospital active medication list Current Facility-Administered Medications  Medication Dose Route Frequency Provider Last Rate Last Dose  .  stroke: mapping our early stages of recovery book   Does not apply Once Aroor, Karena Addison R, MD      . acetaminophen (TYLENOL) tablet 650 mg  650 mg Oral Q4H PRN Aroor, Lanice Schwab, MD       Or  . acetaminophen (TYLENOL) solution 650 mg  650 mg Per Tube Q4H PRN Aroor, Lanice Schwab, MD       Or  . acetaminophen (TYLENOL) suppository 650 mg  650 mg Rectal Q4H PRN Aroor, Karena Addison R, MD      . aspirin chewable tablet 81 mg  81 mg Oral Daily Garvin Fila, MD   81 mg at 01/29/19 0957  . atorvastatin (LIPITOR) tablet 40 mg  40 mg Oral q1800 Burnetta Sabin L, NP   40 mg at 01/28/19 1859  . clopidogrel (PLAVIX) tablet 75 mg  75 mg Oral Daily Burnetta Sabin L, NP   75 mg at 01/29/19 1400  . finasteride (PROSCAR) tablet 5 mg  5 mg Oral Daily Burnetta Sabin L, NP   5 mg at 01/29/19 1400  . hydrochlorothiazide (HYDRODIURIL) tablet 12.5 mg  12.5 mg Oral Daily Burnetta Sabin L, NP   12.5 mg at 01/29/19 1400  .  labetalol (NORMODYNE,TRANDATE) injection 10 mg  10 mg Intravenous Q15 min PRN Burnetta Sabin L, NP      . potassium chloride SA (K-DUR,KLOR-CON) CR tablet 20 mEq  20 mEq Oral BID Burnetta Sabin L, NP   20 mEq at 01/29/19 1304  . tamsulosin (FLOMAX) capsule 0.4 mg  0.4 mg Oral Daily Donzetta Starch, NP   0.4 mg at 01/29/19 1359     Discharge Medications: Please see discharge summary for a list of discharge medications.  Relevant Imaging Results:  Relevant Lab Results:   Additional Information [id number]  Weston Anna, LCSW  I have personally obtained history,examined this patient, reviewed notes, independently viewed imaging studies, participated in medical decision making and plan of care.ROS completed by me personally and pertinent positives fully documented  I have made any additions or clarifications directly to the above note. Agree with note above.   Antony Contras, MD Medical Director [facility] Pager: [phone] 01/30/2019 8:16 PM

## 2019-01-30 LAB — BASIC METABOLIC PANEL
Anion gap: 9 (ref 5–15)
BUN: 7 mg/dL — ABNORMAL LOW (ref 8–23)
CO2: 21 mmol/L — ABNORMAL LOW (ref 22–32)
Calcium: 8.8 mg/dL — ABNORMAL LOW (ref 8.9–10.3)
Chloride: 108 mmol/L (ref 98–111)
Creatinine, Ser: 0.88 mg/dL (ref 0.61–1.24)
GFR calc Af Amer: >60 mL/min (ref >60)
GFR calc non Af Amer: >60 mL/min (ref >60)
Glucose, Bld: 88 mg/dL (ref 70–99)
Potassium: 5.2 mmol/L — ABNORMAL HIGH (ref 3.5–5.1)
Sodium: 138 mmol/L (ref 135–145)

## 2019-01-30 NOTE — Progress Notes (Signed)
  Speech Language Pathology Treatment: Dysphagia;Cognitive-Linquistic  Patient Details Name: Samuel Shelton MRN: [id number] DOB: 12/16/1951 Today's Date: 01/30/2019 Time: 1017-1027 SLP Time Calculation (min) (ACUTE ONLY): 10 min  Assessment / Plan / Recommendation Clinical Impression  Pt required mod-max cueing this session- less verbal and decreased sustained attention and interested in tv program. At rest pt moving mandible as if masticating which can be common in this population (oral cavity empty). RN tech stated pt impulsive, eating at a rapid rate during breakfast. Today he masticated pieces of graham cracker without overt difficulty however recommend he continue Dys 2 given cognitive impairments and difficulty with self awareness and control. Thin liquid consumed without indications of compromised airway protection.   No spontaneous verbalizations this session. He shrugged shoulders with yes/no questions re: orientation. Uncertain how different pt is compared to baseline.    HPI HPI: Samuel Shelton is an 68 y.o. male past medical history developmental disabilities lives in assisted living facility, hypertension presents to the ER as a code stroke for right-sided weakness. 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      SLP Plan  Continue with current plan of care       Recommendations  Diet recommendations: Dysphagia 2 (fine chop);Thin liquid Liquids provided via: Cup;Straw Medication Administration: Crushed with puree Supervision: Staff to assist with self feeding;Full supervision/cueing for compensatory strategies Compensations: Minimize environmental distractions;Slow rate;Small sips/bites;Lingual sweep for clearance of pocketing Postural Changes and/or Swallow Maneuvers: Seated upright 90 degrees                Oral Care Recommendations: Oral care BID Follow up Recommendations: None SLP Visit Diagnosis: Dysphagia, unspecified (R13.10);Cognitive  communication deficit (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

## 2019-01-30 NOTE — Progress Notes (Addendum)
CSW met with patient's niece to provide bed offers. CSW discussed options available, and patient's niece selected Radiation protection practitioner. CSW sent message to [facility] admissions to verify that they had beds available. Awaiting response.  Patient has manual review PASRR due to MR. CSW scanned in documentation. PASRR requesting that MD cosign patient's FL2 before they review. CSW sent message to NP.   CSW to follow.  Laveda Abbe, [place] Clinical Social Worker [phone]

## 2019-01-30 NOTE — Progress Notes (Signed)
Occupational Therapy Treatment Patient Details Name: Samuel Shelton MRN: [id number] DOB: March 05, 1951 Today's Date: 01/30/2019    History of present illness Samuel Shelton is an 68 y.o. male past medical history mental retardation lives in assisted living facility, hypertension presents to the ER as a code stroke for right-sided weakness.  tPA given however CT negative for stroke.   OT comments  Pt progressing towards goals and able to progress with mobility today with two person assist and use of RW. Pt continues to require max multimodal cues intermittently during session to attend to task and follow one step commands; often responds better to simple concise statements. Pt requiring modA for UB ADL, mod-maxA for LB and toileting ADL overall. Feel POC remains appropriate at this time. Will continue to follow acutely to progress pt towards established OT goals.   Follow Up Recommendations  SNF;Supervision/Assistance - 24 hour    Equipment Recommendations  Other (comment)(TBD in next venue)          Precautions / Restrictions Precautions Precautions: Fall Precaution Comments: h/o mental retardation Restrictions Weight Bearing Restrictions: No       Mobility Bed Mobility Overal bed mobility: Needs Assistance Bed Mobility: Rolling;Sidelying to Sit Rolling: Mod assist;+2 for safety/equipment Sidelying to sit: Mod assist;+2 for physical assistance       General bed mobility comments: Pt required assistance to advance LEs to edge of bed and to elevate trunk into sitting.    Transfers Overall transfer level: Needs assistance Equipment used: Rolling walker (2 wheeled) Transfers: Sit to/from Stand Sit to Stand: +2 physical assistance;Mod assist         General transfer comment: Pt attempted to standing unassisted but unable to clear bottom from bed.  Pt require moderate assistance +2 to boost into standing.      Balance Overall balance assessment: Needs  assistance Sitting-balance support: No upper extremity supported;Feet supported Sitting balance-Leahy Scale: Fair     Standing balance support: No upper extremity supported;Bilateral upper extremity supported Standing balance-Leahy Scale: Poor                             ADL either performed or assessed with clinical judgement   ADL Overall ADL's : Needs assistance/impaired Eating/Feeding: Set up;Sitting Eating/Feeding Details (indicate cue type and reason): to open containers and silverwear, cues to utilize silverware as pt initially using hands to eat prior to therapist opening silverware for him Grooming: Wash/dry hands;Set up;Min guard;Sitting Grooming Details (indicate cue type and reason): attempted to have pt perform standing grooming task however pt not following commands to do so; pt does turn off water at sink after therapist turned water on (in attempt to have pt wash hands)     Lower Body Bathing: Minimal assistance;+2 for physical assistance;+2 for safety/equipment;Sit to/from stand Lower Body Bathing Details (indicate cue type and reason): pt assisting to wash LEs while seated in recliner Upper Body Dressing : Minimal assistance;Sitting Upper Body Dressing Details (indicate cue type and reason): doffing/donning new gown Lower Body Dressing: Moderate assistance;+2 for physical assistance;+2 for safety/equipment;Sit to/from stand Lower Body Dressing Details (indicate cue type and reason): assist to doff socks; pt able to don L sock using figure 4 technique and given simple command to complete task; pt attempts to don R sock but despite max effort cannot reach forward to reach R foot (use of figure 4 during efforts) ; at least minA standing balance     Toileting- Clothing Manipulation and  Hygiene: Total assistance;+2 for physical assistance;+2 for safety/equipment Toileting - Clothing Manipulation Details (indicate cue type and reason): pt incontinent of bladder  start of session, pt able to assist some with pericare at bed level when given instruction but is easily distracted and requires assist for throughness; pt with further incontinence of bladder while ambulating in room     Functional mobility during ADLs: Minimal assistance;+2 for physical assistance;+2 for safety/equipment;Rolling walker       Vision       Perception     Praxis      Cognition Arousal/Alertness: Awake/alert Behavior During Therapy: Flat affect Overall Cognitive Status: History of cognitive impairments - at baseline                                 General Comments: pt with history of MR, unsure of pt baseline; currently he requires up to max multimodal cues to follow simple commands, doing so inconsistently and requires frequent reinforcement to follow through with instruction; some impulsivity noted        Exercises     Shoulder Instructions       General Comments      Pertinent Vitals/ Pain       Pain Assessment: Faces Faces Pain Scale: Hurts a little bit Pain Location: hands Pain Descriptors / Indicators: Grimacing Pain Intervention(s): Monitored during session  Home Living                                          Prior Functioning/Environment              Frequency  Min 2X/week        Progress Toward Goals  OT Goals(current goals can now be found in the care plan section)  Progress towards OT goals: Progressing toward goals  Acute Rehab OT Goals Patient Stated Goal: none stated Time For Goal Achievement: 02/11/19 Potential to Achieve Goals: Good ADL Goals Pt Will Perform Grooming: with set-up;sitting Pt Will Perform Upper Body Bathing: with set-up;sitting Pt Will Perform Lower Body Dressing: with min assist;sit to/from stand Pt Will Transfer to Toilet: with min assist;stand pivot transfer;bedside commode  Plan Discharge plan remains appropriate    Co-evaluation    PT/OT/SLP  Co-Evaluation/Treatment: Yes Reason for Co-Treatment: Complexity of the patient's impairments (multi-system involvement);Necessary to address cognition/behavior during functional activity;For patient/therapist safety;To address functional/ADL transfers PT goals addressed during session: Mobility/safety with mobility OT goals addressed during session: ADL's and self-care;Proper use of Adaptive equipment and DME      AM-PAC OT "6 Clicks" Daily Activity     Outcome Measure   Help from another person eating meals?: A Little Help from another person taking care of personal grooming?: A Little Help from another person toileting, which includes using toliet, bedpan, or urinal?: Total Help from another person bathing (including washing, rinsing, drying)?: A Lot Help from another person to put on and taking off regular upper body clothing?: A Lot Help from another person to put on and taking off regular lower body clothing?: A Lot 6 Click Score: 13    End of Session Equipment Utilized During Treatment: Gait belt;Rolling walker  OT Visit Diagnosis: Other abnormalities of gait and mobility (R26.89);Muscle weakness (generalized) (M62.81)   Activity Tolerance Patient tolerated treatment well   Patient Left in chair;with call bell/phone within reach;with  chair alarm set   Nurse Communication Mobility status        Time: 9728-2060 OT Time Calculation (min): 27 min  Charges: OT General Charges $OT Visit: 1 Visit OT Treatments $Self Care/Home Management : 8-22 mins  Lou Cal, OT Supplemental Rehabilitation Services Pager [phone] Office [phone]    Raymondo Band 01/30/2019, 2:31 PM

## 2019-01-30 NOTE — Progress Notes (Signed)
Physical Therapy Treatment Patient Details Name: Samuel Shelton MRN: [id number] DOB: Jun 12, 1951 Today's Date: 01/30/2019    History of Present Illness Samuel Shelton is an 68 y.o. male past medical history mental retardation lives in assisted living facility, hypertension presents to the ER as a code stroke for right-sided weakness.  tPA given however CT negative for stroke.    PT Comments    Pt performed gait training and functional mobility during session.  Session limited due to bouts of urinary incontinence.  Pt follows commands inconsistently.  Pt is unable to express concerns regarding d/c planning based on level of function he continues to benefit from rehab in a post acute setting.      Follow Up Recommendations  SNF;Supervision/Assistance - 24 hour     Equipment Recommendations  None recommended by PT(TBD next session)    Recommendations for Other Services       Precautions / Restrictions Precautions Precautions: Fall Precaution Comments: h/o mental retardation Restrictions Weight Bearing Restrictions: No    Mobility  Bed Mobility Overal bed mobility: Needs Assistance Bed Mobility: Rolling;Sidelying to Sit Rolling: Mod assist;+2 for safety/equipment Sidelying to sit: Mod assist;+2 for physical assistance       General bed mobility comments: Pt required assistance to advance LEs to edge of bed and to elevate trunk into sitting.    Transfers Overall transfer level: Needs assistance Equipment used: Rolling walker (2 wheeled) Transfers: Sit to/from Stand Sit to Stand: +2 physical assistance;Mod assist         General transfer comment: Pt attempted to standing unassisted but unable to clear bottom from bed.  Pt require moderate assistance +2 to boost into standing.    Ambulation/Gait Ambulation/Gait assistance: Mod assist;+2 safety/equipment Gait Distance (Feet): 40 Feet Assistive device: Rolling walker (2 wheeled) Gait Pattern/deviations: Step-through  pattern;Decreased stride length;Decreased step length - right;Trunk flexed Gait velocity: slow   General Gait Details: Assistance to turn and manage RW device.  Pt has difficulty following commands and impulsive during gait training.     Stairs             Wheelchair Mobility    Modified Rankin (Stroke Patients Only)       Balance Overall balance assessment: Needs assistance   Sitting balance-Leahy Scale: Fair       Standing balance-Leahy Scale: Poor                              Cognition Arousal/Alertness: Awake/alert Behavior During Therapy: Flat affect Overall Cognitive Status: History of cognitive impairments - at baseline                                        Exercises      General Comments        Pertinent Vitals/Pain Pain Assessment: Faces Faces Pain Scale: Hurts a little bit Pain Location: hands Pain Intervention(s): Monitored during session;Repositioned    Home Living                      Prior Function            PT Goals (current goals can now be found in the care plan section) Acute Rehab PT Goals Patient Stated Goal: none stated PT Goal Formulation: Patient unable to participate in goal setting Potential to Achieve Goals: Good Progress towards PT goals:  Progressing toward goals    Frequency    Min 3X/week      PT Plan Current plan remains appropriate    Co-evaluation PT/OT/SLP Co-Evaluation/Treatment: Yes Reason for Co-Treatment: Complexity of the patient's impairments (multi-system involvement);For patient/therapist safety PT goals addressed during session: Mobility/safety with mobility        AM-PAC PT "6 Clicks" Mobility   Outcome Measure  Help needed turning from your back to your side while in a flat bed without using bedrails?: A Lot Help needed moving from lying on your back to sitting on the side of a flat bed without using bedrails?: A Lot Help needed moving to and from a  bed to a chair (including a wheelchair)?: A Lot Help needed standing up from a chair using your arms (e.g., wheelchair or bedside chair)?: A Lot Help needed to walk in hospital room?: Total Help needed climbing 3-5 steps with a railing? : Total 6 Click Score: 10    End of Session Equipment Utilized During Treatment: Gait belt Activity Tolerance: Patient tolerated treatment well Patient left: in chair;with call bell/phone within reach;with chair alarm set;with nursing/sitter in room Nurse Communication: Mobility status PT Visit Diagnosis: Unsteadiness on feet (R26.81)     Time: 1610-9604 PT Time Calculation (min) (ACUTE ONLY): 33 min  Charges:  $Gait Training: 8-22 mins                     Samuel Shelton, PTA Acute Rehabilitation Services Pager [phone] Office [phone]     Samuel Shelton Samuel Shelton 01/30/2019, 12:34 PM

## 2019-01-30 NOTE — Progress Notes (Addendum)
STROKE TEAM PROGRESS NOTE   INTERVAL HISTORY Up in the chair at bedside.  Brighter and talkative today.  Increasing level of care.  Needs to stay in the hospital 1 more day to activate Medicare SNF coverage.  Medically ready for discharge.  Vitals:   01/29/19 1129 01/29/19 1528 01/30/19 0040 01/30/19 0759  BP: (!) 162/92 (!) 160/99 (!) 180/106 (!) 130/118  Pulse: (!) 53  64 64  Resp: 18 16 15 19   Temp: 97.9 F (36.6 C) 98 F (36.7 C) 98.1 F (36.7 C) 98.5 F (36.9 C)  TempSrc: Axillary  Oral Oral  SpO2: 98% 98% 99% 97%  Weight:      Height:        CBC:  Recent Labs  Lab 01/27/19 2345  WBC 4.5  NEUTROABS 2.1  HGB 12.6*  HCT 38.8*  MCV 88.0  PLT 536    Basic Metabolic Panel:  Recent Labs  Lab 01/27/19 2345 01/30/19 0531  NA 136 138  K 3.3* 5.2*  CL 102 108  CO2 24 21*  GLUCOSE 96 88  BUN 18 7*  CREATININE 1.18 0.88  CALCIUM 8.9 8.8*    IMAGING Mr Brain Wo Contrast  Result Date: 01/28/2019 CLINICAL DATA:  68 y/o  M; follow-up of stroke. EXAM: MRI HEAD WITHOUT CONTRAST TECHNIQUE: Multiplanar, multiecho pulse sequences of the brain and surrounding structures were obtained without intravenous contrast. COMPARISON:  01/27/2019 CT head.  01/28/2009 T CTA head. FINDINGS: Brain: 16 mm focus of reduced diffusion within the left ventral thalamus and posterior limb of internal capsule (series 3, image 22) compatible with acute/early subacute infarction. Additional punctate foci of acute/early subacute infarction are present within the left hemi pons (series 4, image 21), right occipital periventricular white matter (series 4, image 25), and left posterior temporal periventricular white matter (series 4, image 27). No associated hemorrhage or mass effect. Confluent nonspecific T2 FLAIR hyperintensities in subcortical and periventricular white matter are compatible with severe chronic microvascular ischemic changes. Severe volume loss of the brain. Multiple small chronic  infarctions are present within the basal ganglia and central pons. Punctate foci of susceptibility hypointensity are present scattered throughout the brain consistent with hemosiderin deposition of chronic microhemorrhage. The distribution of chronic microhemorrhage is greater in the posterior and central distributions favoring sequelae of chronic hypertension. No extra-axial collection, hydrocephalus, or herniation. Vascular: Normal flow voids. Skull and upper cervical spine: Right frontal scalp lipoma noted. Sinuses/Orbits: Negative. Other: None. IMPRESSION: 1. 16 mm acute/early subacute infarction in left ventral thalamus and posterior limb of internal capsule. Additional punctate foci of infarction are present within left hemi pons, right occipital periventricular white matter, and left posterior temporal periventricular white matter. No hemorrhage or mass effect. 2. Advanced chronic microvascular ischemic changes and volume loss of the brain. Small chronic infarcts in basal ganglia and pons. These results will be called to the ordering clinician or representative by the Radiologist Assistant, and communication documented in the PACS or zVision Dashboard. Electronically Signed   By: Kristine Garbe M.D.   On: 01/28/2019 15:50    PHYSICAL EXAM middle aged african [place] male not in distress. . Afebrile. Head is nontraumatic. Neck is supple without bruit.    Cardiac exam no murmur or gallop. Lungs are clear to auscultation. Distal pulses are well felt.  Neurological Exam ;  Awake alert  Less severly dysarthric speech that yesterday, easier to understand .follows simple midline and occasional one-step commands only. Extraocular moments are full range without nystagmus. Blinks to  threat bilaterally. Mild right lower facial asymmetry. Tongue midline. Motor system exam limited due to patient cooperation but able to move all 4 extremities against gravity but   has mild right-sided weakness though  cooperation is limited. Sensory exam and coordination cannot be reliably tested. Deep tendon pulses are symmetric. Plantars are both downgoing. Gait not tested.   ASSESSMENT/PLAN [name] is a 68 y.o. male with history of MR and HTN presenting with R hemiparesis s/p IV tPA 01/28/2019 at [facility].  Stroke: L basal ganglia, R parietal white matter, R occipital,  and L pontine infarcts - all due to small vessel disease - TIA s/p IV tPA.  Code Stroke CT head No acute stroke. Old lacunes. Small vessel disease. Atrophy. ASPECTS 10.    CT CSpine no fx or malalignment   CTA head no ELVO. Mod stenosis L M2 and R ICA  CTA neck no sign stenosis.  4.2 cm ascending aorta  MRI  L BG (thalamus/PLIC), R occipital, R parietal WM and L pontine infarct . Advanced small vessel disease. Atrophy.  Old BG and pons infarcts  2D Echo  EF 55-60%. Concentric left ventricular hyp  EEG no sz. Global encephalopathy  LDL 96  HgbA1c 5.6  SCDs for VTE prophylaxis  No antithrombotic prior to admission, now on aspirin 81 mg daily and clopidogrel 75 mg daily started last night. Given mild stroke, recommend aspirin 81 mg and plavix 75 mg daily x 3 weeks, then aspirin alone.   Disposition:  SNF - admitted from a group home, niece is POA  Medically ready for d/c. Needs 1 more night in hospital to activate Medicare SNF benefits.  Awaiting PSSAR.  Hypertension  Stable  . SBP goal now per standard guidelines < 220/110 . On Home meds:  hctz . Long-term BP goal normotensive  Hyperlipidemia  Home meds:  No statin  LDL 96, goal < 70  Added liipitor 40  Continue statin at discharge  Other Stroke Risk Factors  Advanced age  Former Cigarette smoker  Family hx stroke (mother)  Other Active Problems  Baseline MR  BPH on flomax  Hypererpkalemia K 3.3 -> 5.2  - after supplemented - recheck in am  Hospital day # [place], MSN, APRN, ANVP-BC, AGPCNP-BC Advanced Practice Stroke Nurse [place] for Schedule & Pager information 01/30/2019 2:25 PM  I have personally obtained history,examined this patient, reviewed notes, independently viewed imaging studies, participated in medical decision making and plan of care.ROS completed by me personally and pertinent positives fully documented  I have made any additions or clarifications directly to the above note. Agree with note above.  Antony Contras, MD Medical Director [facility] Pager: [phone] 01/30/2019 3:25 PM   To contact Stroke Continuity provider, please refer to [email or web address] After hours, contact General Neurology

## 2019-01-31 LAB — BASIC METABOLIC PANEL
Anion gap: 13 (ref 5–15)
BUN: 13 mg/dL (ref 8–23)
CO2: 23 mmol/L (ref 22–32)
Calcium: 9.3 mg/dL (ref 8.9–10.3)
Chloride: 104 mmol/L (ref 98–111)
Creatinine, Ser: 0.95 mg/dL (ref 0.61–1.24)
GFR calc Af Amer: >60 mL/min (ref >60)
GFR calc non Af Amer: >60 mL/min (ref >60)
Glucose, Bld: 101 mg/dL — ABNORMAL HIGH (ref 70–99)
Potassium: 3.5 mmol/L (ref 3.5–5.1)
Sodium: 140 mmol/L (ref 135–145)

## 2019-01-31 MED ORDER — STROKE: EARLY STAGES OF RECOVERY BOOK: 1.0000 | Freq: Once | 0 refills | Status: AC

## 2019-01-31 MED ORDER — ASPIRIN 81 MG PO CHEW: 81.0000 mg | CHEWABLE_TABLET | Freq: Every day | ORAL | Status: DC

## 2019-01-31 MED ORDER — CLOPIDOGREL BISULFATE 75 MG PO TABS: 75.0000 mg | ORAL_TABLET | Freq: Every day | ORAL | 2 refills | Status: DC

## 2019-01-31 MED ORDER — ATORVASTATIN CALCIUM 40 MG PO TABS: 40.0000 mg | ORAL_TABLET | Freq: Every day | ORAL | 2 refills | Status: DC

## 2019-01-31 NOTE — Progress Notes (Signed)
Pt discharge education and instructions completed with pt. Pt discharge to [facility] and report called off to nurse Silva Bandy at the facility. Pt IV removed and clean grown placed on pt. Pt awaiting on PTAR to transport off facility to disposition. Will continue to monitor till pick by PTAR. Delia Heady RN

## 2019-01-31 NOTE — Clinical Social Work Placement (Signed)
Nurse to call report to [phone], Room [place]  NOTE  Date:  01/31/2019  Patient Details  Name: Samuel Shelton MRN: [id number] Date of Birth: [birth year]  Clinical Social Work is seeking post-discharge placement for this patient at the [place] level of care (*CSW will initial, date and re-position this form in  chart as items are completed):  Yes   Patient/family provided with [place] Work Department's list of facilities offering this level of care within the geographic area requested by the patient (or if unable, by the patient's family).  Yes   Patient/family informed of their freedom to choose among providers that offer the needed level of care, that participate in Medicare, Medicaid or managed care program needed by the patient, have an available bed and are willing to accept the patient.  Yes   Patient/family informed of [facility]'s ownership interest in [facility] and [facility], as well as of the fact that they are under no obligation to receive care at these facilities.  PASRR submitted to EDS on       PASRR number received on 01/31/19     Existing PASRR number confirmed on       FL2 transmitted to all facilities in geographic area requested by pt/family on 01/30/19     FL2 transmitted to all facilities within larger geographic area on       Patient informed that his/her managed care company has contracts with or will negotiate with certain facilities, including the following:        Yes   Patient/family informed of bed offers received.  Patient chooses bed at [facility]     Physician recommends and patient chooses bed at      Patient to be transferred [facility] on 01/31/19.  Patient to be transferred to facility by PTAR     Patient family notified on 01/31/19 of transfer.  Name of family member notified:  [name]     PHYSICIAN        Additional Comment:    _______________________________________________ Geralynn Ochs, [place] 01/31/2019, 2:32 PM

## 2019-01-31 NOTE — Progress Notes (Addendum)
STROKE TEAM PROGRESS NOTE   INTERVAL HISTORY Up in the chair at bedside.  Brighter and talkative today. Stable exam and ready for d/c to SNF   Vitals:   01/30/19 1942 01/30/19 2336 01/31/19 0321 01/31/19 0749  BP: (!) 135/92 133/89 136/81 108/76  Pulse: 72 77 79 64  Resp: 20 20 20 14   Temp: 98.8 F (37.1 C) 98.6 F (37 C) 98.3 F (36.8 C) 98.6 F (37 C)  TempSrc: Oral Oral Oral Oral  SpO2: 99% 98% 98% 99%  Weight:      Height:        CBC:  Recent Labs  Lab 01/27/19 2345  WBC 4.5  NEUTROABS 2.1  HGB 12.6*  HCT 38.8*  MCV 88.0  PLT 992    Basic Metabolic Panel:  Recent Labs  Lab 01/30/19 0531 01/31/19 0514  NA 138 140  K 5.2* 3.5  CL 108 104  CO2 21* 23  GLUCOSE 88 101*  BUN 7* 13  CREATININE 0.88 0.95  CALCIUM 8.8* 9.3    IMAGING No results found.  PHYSICAL EXAM middle aged african [place] male not in distress. . Afebrile. Head is nontraumatic. Neck is supple without bruit.    Cardiac exam no murmur or gallop. Lungs are clear to auscultation. Distal pulses are well felt.  Neurological Exam ;  Awake alert mild-mod dysarthric speech. follows simple midline and occasional one-step commands only. Extraocular moments are full range without nystagmus. Blinks to threat bilaterally. Mild right lower facial asymmetry. Tongue midline. Motor system exam limited due to patient cooperation but able to move all 4 extremities against gravity but   has mild right-sided weakness though cooperation is limited. Sensory exam and coordination cannot be reliably tested. Deep tendon pulses are symmetric. Plantars are both downgoing. Gait not tested.   ASSESSMENT/PLAN [name] is a 68 y.o. male with history of MR and HTN presenting with R hemiparesis s/p IV tPA 01/28/2019 at [place].  Stroke: L basal ganglia, R parietal white matter, R occipital,  and L pontine infarcts - all due to small vessel disease - TIA s/p IV tPA.  Code Stroke CT head No acute stroke. Old lacunes.  Small vessel disease. Atrophy. ASPECTS 10.    CT CSpine no fx or malalignment   CTA head no ELVO. Mod stenosis L M2 and R ICA  CTA neck no sign stenosis.  4.2 cm ascending aorta  MRI  L BG (thalamus/PLIC), R occipital, R parietal WM and L pontine infarct . Advanced small vessel disease. Atrophy.  Old BG and pons infarcts  2D Echo  EF 55-60%. Concentric left ventricular hyp  EEG no sz. Global encephalopathy  LDL 96  HgbA1c 5.6  SCDs for VTE prophylaxis  No antithrombotic prior to admission, now on aspirin 81 mg daily and clopidogrel 75 mg daily started last night. Given mild stroke, recommend aspirin 81 mg and plavix 75 mg daily x 3 weeks, then aspirin alone.   Disposition:  SNF - admitted from a group home, niece is POA  Medically ready for d/c. PSSAR completed.  Hypertension  Stable  . SBP goal now per standard guidelines < 220/110 . On Home meds:  hctz . Long-term BP goal normotensive  Hyperlipidemia  Home meds:  No statin  LDL 96, goal < 70  Added liipitor 40  Continue statin at discharge  Other Stroke Risk Factors  Advanced age  Former Cigarette smoker  Family hx stroke (mother)  Other Active Problems  Baseline MR  BPH  on flomax  Hypererpkalemia K 3.3 -> 5.2  - after supplemented - recheck in am  Hospital day # [place], MSN, APRN, ANVP-BC Advanced Practice Stroke Nurse [place] for Schedule & Pager information 01/31/2019 1:46 PM  I have personally obtained history,examined this patient, reviewed notes, independently viewed imaging studies, participated in medical decision making and plan of care.ROS completed by me personally and pertinent positives fully documented  I have made any additions or clarifications directly to the above note. Agree with note above.    Antony Contras, MD Medical Director [place] Pager: [phone] 01/31/2019 3:06 PM   To contact Stroke Continuity  provider, please refer to [email or web address] After hours, contact General Neurology

## 2019-01-31 NOTE — Progress Notes (Signed)
Pt picked up by PTAR to be transported off to disposition. Delia Heady RN

## 2019-01-31 NOTE — Care Management Important Message (Signed)
Important Message  Patient Details  Name: Samuel Shelton MRN: [id number] Date of Birth: [birth year]   Medicare Important Message Given:  Yes    Orbie Pyo 01/31/2019, 2:13 PM

## 2019-02-28 ENCOUNTER — Other Ambulatory Visit: Payer: Self-pay | Admitting: Family Medicine

## 2019-02-28 DIAGNOSIS — C61 Malignant neoplasm of prostate: Secondary | ICD-10-CM

## 2019-03-01 ENCOUNTER — Other Ambulatory Visit: Payer: Medicare Other

## 2019-03-01 DIAGNOSIS — C61 Malignant neoplasm of prostate: Secondary | ICD-10-CM

## 2019-03-02 LAB — PSA: Prostate Specific Ag, Serum: 5.4 ng/mL — ABNORMAL HIGH (ref 0.0–4.0)

## 2019-03-05 ENCOUNTER — Ambulatory Visit (INDEPENDENT_AMBULATORY_CARE_PROVIDER_SITE_OTHER): Payer: Medicare Other | Admitting: Urology

## 2019-03-05 ENCOUNTER — Encounter: Payer: Self-pay | Admitting: Urology

## 2019-03-05 VITALS — BP 175/104 | HR 60 | Ht 74.0 in | Wt 196.0 lb

## 2019-03-05 DIAGNOSIS — I639 Cerebral infarction, unspecified: Secondary | ICD-10-CM

## 2019-03-05 DIAGNOSIS — R339 Retention of urine, unspecified: Secondary | ICD-10-CM

## 2019-03-05 DIAGNOSIS — C61 Malignant neoplasm of prostate: Secondary | ICD-10-CM

## 2019-03-05 LAB — BLADDER SCAN AMB NON-IMAGING

## 2019-03-05 NOTE — Progress Notes (Signed)
03/05/2019  11:34 AM   Samuel Shelton 09-Oct-1951 [id number]  Referring provider: Sofie Hartigan, MD [address] [place], [place] [address]  Chief Complaint  Patient presents with  . Prostate Cancer    Follow up   HPI: Samuel Shelton is a 68 yo M with a history of low risk prostate cancer diagnosed in 2016 on surveillance to return today for f/u.   He initially presented for evaluation of an elevated PSA to 5.36 ng/dL in Fall 2015 . He also had a 4K study which indicated 43% risk of high grade malignancy if DRE is positive or 26% if DRE is negative. Unfortunately, Samuel Shelton is mentally handicapped and unable to tolerate a DRE/ biopsy in the office. In the interim, his PSA rose to rose to 7.4 ng/dL on 05/20/15.  He was taken to the OR on 05/2015 at which time DRE in the OR showed no evidence of nodules, enlarged gland. Pathology consistent wit Gleason 3+3 involving 3/13 cores up to 30% on the right.   He returned to the operating room on 09/2016 for repeat confirmatory biopsy. This showed a total of 3 of 12 cores on the left with Gleason 3+3 involving up to 50% of the tissue. Exam under anesthesia was unremarkable. Prostate volume measured 35 cc.  His PSA has been relatively stable in the 7- 8 range but recently jumped up to 11.2.His is stable x 3 months at 11.1 on 01/18/18.His PSA (5.4) dropped with appropriate use of finasteride.   He has had an elevated postvoid residual on multiple previous occasional UTI. It is difficult to assess his voiding symptoms based on his mental status. Voiding symptoms are improved after starting flomax and now finasteride.  PVR has been elevated on mutliple occations. PVR today is 383 mL. He is accompanied by his health care proxy, Samuel Shelton today.   He had a stroke since his last visit which affected his ability to talk.  He is a phasic today.  He has not had any recent bladder infections and has been voiding well. Denies  urinary incontinence.   He is now living in assisted living.  PMH: Past Medical History:  Diagnosis Date  . Chronic mental illness   . Edema of both legs   . Elevated PSA   . Elevated PSA   . Hypertension   . Mental retardation   . Phimosis     Surgical History: Past Surgical History:  Procedure Laterality Date  . PROSTATE BIOPSY N/A 06/22/2015   Procedure: PROSTATE BIOPSY;  Surgeon: Hollice Espy, MD;  Location: [facility] ORS;  Service: Urology;  Laterality: N/A;  . PROSTATE BIOPSY N/A 09/27/2016   Procedure: PROSTATE BIOPSY;  Surgeon: Hollice Espy, MD;  Location: [facility] ORS;  Service: Urology;  Laterality: N/A;    Home Medications:  Allergies as of 03/05/2019   No Known Allergies     Medication List       Accurate as of March 05, 2019 11:34 AM. Always use your most recent med list.        acetaminophen 500 MG tablet Commonly known as:  TYLENOL Take 500 mg by mouth every 4 (four) hours as needed for mild pain.   aspirin 81 MG chewable tablet Chew 1 tablet (81 mg total) by mouth daily.   atorvastatin 40 MG tablet Commonly known as:  LIPITOR Take 1 tablet (40 mg total) by mouth daily at 6 PM.   clopidogrel 75 MG tablet Commonly known as:  PLAVIX Take 1  tablet (75 mg total) by mouth daily.   finasteride 5 MG tablet Commonly known as:  PROSCAR Take 1 tablet (5 mg total) by mouth daily.   guaifenesin 100 MG/5ML syrup Commonly known as:  ROBITUSSIN Take 200 mg by mouth 3 (three) times daily as needed for cough.   hydrochlorothiazide 12.5 MG tablet Commonly known as:  HYDRODIURIL Take 12.5 mg by mouth.   loperamide 2 MG capsule Commonly known as:  IMODIUM Take 2 mg by mouth daily as needed for diarrhea or loose stools.   magnesium hydroxide 400 MG/5ML suspension Commonly known as:  MILK OF MAGNESIA Take 30 mLs by mouth at bedtime as needed for mild constipation.   Mintox 440-102-72 MG/5ML suspension Generic drug:  alum & mag hydroxide-simeth Take 30 mLs by  mouth every 6 (six) hours as needed for indigestion or heartburn.   neomycin-bacitracin-polymyxin Oint Commonly known as:  NEOSPORIN Apply 1 application topically daily as needed for irritation or wound care.   tamsulosin 0.4 MG Caps capsule Commonly known as:  Flomax Take 1 capsule (0.4 mg total) by mouth daily.       Allergies: No Known Allergies  Family History: Family History  Problem Relation Age of Onset  . Stroke Mother   . Hypertension Sister   . Diabetes type II Sister     Social History:  reports that he has quit smoking. His smoking use included cigarettes. He smoked 0.50 packs per day. He has never used smokeless tobacco. He reports that he does not drink alcohol or use drugs.  ROS: UROLOGY Frequent Urination?: No Hard to postpone urination?: No Burning/pain with urination?: No Get up at night to urinate?: No Leakage of urine?: No Urine stream starts and stops?: No Trouble starting stream?: No Do you have to strain to urinate?: No Blood in urine?: No Urinary tract infection?: No Sexually transmitted disease?: No Injury to kidneys or bladder?: No Painful intercourse?: No Weak stream?: No Erection problems?: No Penile pain?: No  Gastrointestinal Nausea?: No Vomiting?: No Indigestion/heartburn?: No Diarrhea?: No Constipation?: No  Constitutional Fever: No Night sweats?: No Weight loss?: No Fatigue?: No  Skin Skin rash/lesions?: No Itching?: No  Eyes Blurred vision?: No Double vision?: No  Ears/Nose/Throat Sore throat?: No Sinus problems?: No  Hematologic/Lymphatic Swollen glands?: No Easy bruising?: No  Cardiovascular Leg swelling?: No Chest pain?: No  Respiratory Cough?: No Shortness of breath?: No  Endocrine Excessive thirst?: No  Musculoskeletal Back pain?: No Joint pain?: No  Neurological Headaches?: No Dizziness?: No  Psychologic Depression?: No Anxiety?: No  Physical Exam: BP (!) 175/104   Pulse 60   Ht  6\' 2"  (1.88 m)   Wt 196 lb (88.9 kg)   BMI 25.16 kg/m   Constitutional:  Alert and oriented, No acute distress. HEENT: [place] AT, moist mucus membranes.  Trachea midline, no masses. Cardiovascular: No clubbing, cyanosis, or edema. Respiratory: Normal respiratory effort, no increased work of breathing. Skin: No rashes, bruises or suspicious lesions. Neurologic: Grossly intact, no focal deficits, moving all 4 extremities. Aphasia.  Psychiatric: Normal mood and affect.  Pertinent Imaging: Results for orders placed or performed in visit on 03/05/19  BLADDER SCAN AMB NON-IMAGING  Result Value Ref Range   Scan Result 338ml     Assessment & Plan:    1. Prostate cancer  Low risk prostate cancer with recent drop in PSA (5.4) after use of finasteride appropriately which is reassuring Continue use of finasteride both for urinary symptoms as well as consistency while trending  PSA Unable to tolerate in office DRE We will continue to follow him with surveillance given his multiple medical comorbidities and complex social situation F/u in 6 months for PSA/PVR   2. Incomplete bladder emptying Postvoid residual today more elevated than on previous occasions Continue taking Flomax and finasteride  He has no complaints, able to nod yes and no which seems to be appropriate He is not a good candidate for Foley catheter or clean intermittent catheterization thus will manage expectantly unless he develops other sequela of incomplete emptying or florid urinary retention Patient's healthcare proxy is agreeable this plan  Return in about 6 months (around 09/05/2019) for PSA/PVR.  [facility] [address] [place], [place] [address] [phone]  I, Lucas Mallow, am acting as a scribe for Dr. Hollice Espy,  I have reviewed the above documentation for accuracy and completeness, and I agree with the above.   Hollice Espy, MD

## 2019-05-07 ENCOUNTER — Other Ambulatory Visit: Payer: Self-pay

## 2019-05-07 NOTE — Patient Outreach (Signed)
Telephone outreach to patient to obtain mRs was successfully completed. mRs= 3. Spoke with niece [name] (on [place]) to obtain score.

## 2019-05-07 NOTE — Patient Outreach (Signed)
First attempt to obtain mRs. No answer. Left message for return call.  

## 2019-08-27 ENCOUNTER — Other Ambulatory Visit: Payer: Self-pay | Admitting: Family Medicine

## 2019-08-27 DIAGNOSIS — C61 Malignant neoplasm of prostate: Secondary | ICD-10-CM

## 2019-08-28 ENCOUNTER — Encounter: Payer: Self-pay | Admitting: Urology

## 2019-08-28 ENCOUNTER — Other Ambulatory Visit: Payer: Medicare (Managed Care)

## 2019-09-04 ENCOUNTER — Other Ambulatory Visit: Payer: Self-pay

## 2019-09-04 ENCOUNTER — Encounter: Payer: Self-pay | Admitting: Urology

## 2019-09-04 ENCOUNTER — Ambulatory Visit (INDEPENDENT_AMBULATORY_CARE_PROVIDER_SITE_OTHER): Payer: Medicare (Managed Care) | Admitting: Urology

## 2019-09-04 VITALS — BP 142/85 | HR 71 | Ht 74.0 in | Wt 196.0 lb

## 2019-09-04 DIAGNOSIS — C61 Malignant neoplasm of prostate: Secondary | ICD-10-CM | POA: Diagnosis not present

## 2019-09-04 DIAGNOSIS — R339 Retention of urine, unspecified: Secondary | ICD-10-CM | POA: Diagnosis not present

## 2019-09-04 LAB — BLADDER SCAN AMB NON-IMAGING

## 2019-09-04 NOTE — Progress Notes (Signed)
09/04/2019 11:37 AM   Samuel Shelton 02/13/1951 [id number]  Referring provider: Sofie Hartigan, MD [place],  [place] [address]  Chief Complaint  Patient presents with  . Prostate Cancer    follow up     HPI: 68 year old male with low risk prostate cancer who returns today for routine follow-up.  He was last seen in 02/2019.  He did not get his PSA prior to today's appointment.  Prostate cancer history: He initially presented for evaluation of an elevated PSA to 5.36 ng/dL in Fall 2015 . He also had a 4K study which indicated 43% risk of high grade malignancy if DRE is positive or 26% if DRE is negative. 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 In the interim, his PSA rose to rose to 7.4 ng/dL on 05/20/15.  He was taken to the OR on 05/2015 at which time DRE in the OR showed no evidence of nodules, enlarged gland. Pathology consistent wit Gleason 3+3 involving 3/13 cores up to 30% on the right.   He returned to the operating room on 09/2016 for repeat confirmatory biopsy. This showed a total of 3 of 12 cores on the left with Gleason 3+3 involving up to 50% of the tissue. Exam under anesthesia was unremarkable. Prostate volume measured 35 cc.  His PSA has been relatively stable in the 7- 8 range but recently jumped up to 11.2.His is stable x 3 months at 11.1 on 01/18/18.His PSA (5.4) dropped with appropriate use of finasteride.   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  Per his CNA, he has been having fecal and urinary incontinence and a diaper which is unchanged.  They have noted an odor but he is otherwise asymptomatic, afebrile, does not seem to be in pain with voiding.  He denies this today by shaking his  head no when I ask him specifically.   PMH: Past Medical History:  Diagnosis Date  . Chronic mental illness   . Edema of both legs   . Elevated PSA   . Elevated PSA   . Hypertension   . Mental retardation   . Phimosis     Surgical History: Past Surgical History:  Procedure Laterality Date  . PROSTATE BIOPSY N/A 06/22/2015   Procedure: PROSTATE BIOPSY;  Surgeon: Hollice Espy, MD;  Location: [facility] ORS;  Service: Urology;  Laterality: N/A;  . PROSTATE BIOPSY N/A 09/27/2016   Procedure: PROSTATE BIOPSY;  Surgeon: Hollice Espy, MD;  Location: [facility] ORS;  Service: Urology;  Laterality: N/A;    Home Medications:  Allergies as of 09/04/2019   No Known Allergies     Medication List       Accurate as of September 04, 2019 11:37 AM. If you have any questions, ask your nurse or doctor.        acetaminophen 500 MG tablet Commonly known as: TYLENOL Take 500 mg by mouth every 4 (four) hours as needed for mild pain.   aspirin 81 MG chewable tablet Chew 1 tablet (81 mg total) by mouth daily.   atorvastatin 40 MG tablet Commonly known as: LIPITOR Take 1 tablet (40 mg total) by mouth daily at 6 PM.   clopidogrel 75 MG tablet Commonly known as: PLAVIX Take 1 tablet (75 mg total) by mouth daily.  finasteride 5 MG tablet Commonly known as: PROSCAR Take 1 tablet (5 mg total) by mouth daily.   guaifenesin 100 MG/5ML syrup Commonly known as: ROBITUSSIN Take 200 mg by mouth 3 (three) times daily as needed for cough.   hydrochlorothiazide 12.5 MG tablet Commonly known as: HYDRODIURIL Take 12.5 mg by mouth.   loperamide 2 MG capsule Commonly known as: IMODIUM Take 2 mg by mouth daily as needed for diarrhea or loose stools.   magnesium hydroxide 400 MG/5ML suspension Commonly known as: MILK OF MAGNESIA Take 30 mLs by mouth at bedtime as needed for mild constipation.   Mintox [id number] MG/5ML suspension Generic drug: alum & mag hydroxide-simeth Take 30 mLs by mouth every 6  (six) hours as needed for indigestion or heartburn.   neomycin-bacitracin-polymyxin Oint Commonly known as: NEOSPORIN Apply 1 application topically daily as needed for irritation or wound care.   tamsulosin 0.4 MG Caps capsule Commonly known as: Flomax Take 1 capsule (0.4 mg total) by mouth daily.       Allergies: No Known Allergies  Family History: Family History  Problem Relation Age of Onset  . Stroke Mother   . Hypertension Sister   . Diabetes type II Sister     Social History:  reports that he has quit smoking. His smoking use included cigarettes. He smoked 0.50 packs per day. He has never used smokeless tobacco. He reports that he does not drink alcohol or use drugs.  ROS: Unable to provide review of systems due to aphasia  Physical Exam: BP (!) 142/85   Pulse 71   Ht 6\' 2"  (1.88 m)   Wt 196 lb (88.9 kg)   BMI 25.16 kg/m   Constitutional:  Alert and oriented, No acute distress. HEENT: [place] AT, moist mucus membranes.  Trachea midline, no masses. Cardiovascular: No clubbing, cyanosis, or edema. Respiratory: Normal respiratory effort, no increased work of breathing. GI: Abdomen is soft, nontender, nondistended, no abdominal masses GU: Unable to tolerate rectal exam Skin: No rashes, bruises or suspicious lesions. Neurologic: Grossly intact, no focal deficits, moving all 4 extremities. Psychiatric: Normal mood and affect.  Laboratory Data: Lab Results  Component Value Date   WBC 4.5 01/27/2019   HGB 12.6 (L) 01/27/2019   HCT 38.8 (L) 01/27/2019   MCV 88.0 01/27/2019   PLT 202 01/27/2019    Lab Results  Component Value Date   CREATININE 0.95 01/31/2019    Lab Results  Component Value Date   HGBA1C 5.6 01/28/2019     Pertinent Imaging: Results for orders placed or performed in visit on 09/04/19  Bladder Scan (Post Void Residual) in office  Result Value Ref Range   Scan Result 231ml     Assessment & Plan:    1. Prostate cancer [facility]) Due for PSA  today, however the patient is combative with blood draw.  We tried this on several attempts with different interventions unsuccessfully.  We will have him return next week to see if he is having a better day for PSA draw.  If his PSA remains stable, will likely follow him less frequently, annually given his numerous other medical comorbidities and overall condition.  Threshold for intervention is quite high.  - PSA - Bladder Scan (Post Void Residual) in office  2. Incomplete bladder emptying Chronic incomplete bladder emptying, otherwise asymptomatic  - Urinalysis, Complete   Hollice Espy, MD  [place] [address], [place], [place] [address] [phone]

## 2019-09-05 LAB — URINALYSIS, COMPLETE
Bilirubin, UA: NEGATIVE
Glucose, UA: NEGATIVE
Ketones, UA: NEGATIVE
Leukocytes,UA: NEGATIVE
Nitrite, UA: NEGATIVE
Protein,UA: NEGATIVE
RBC, UA: NEGATIVE
Specific Gravity, UA: 1.025 (ref 1.005–1.030)
Urobilinogen, Ur: 0.2 mg/dL (ref 0.2–1.0)
pH, UA: 6.5 (ref 5.0–7.5)

## 2019-09-05 LAB — MICROSCOPIC EXAMINATION
Bacteria, UA: NONE SEEN
RBC, Urine: NONE SEEN /hpf (ref 0–2)

## 2019-09-11 ENCOUNTER — Other Ambulatory Visit: Payer: Medicare (Managed Care)

## 2019-09-19 ENCOUNTER — Telehealth: Payer: Self-pay | Admitting: Urology

## 2019-09-19 ENCOUNTER — Other Ambulatory Visit: Payer: Self-pay

## 2019-09-19 NOTE — Telephone Encounter (Signed)
I received fax labs from the patient's primary care physician.  He had a PSA drawn which is now 3.3 which is excellent news.  This is significantly lower than his previous values.  Is very reassuring.  I like to see him next year again for annual PSA.  Please schedule this appointment as it is not currently scheduled.  Hollice Espy, MD

## 2019-09-20 NOTE — Telephone Encounter (Addendum)
Spoke with patient's niece-verified DPR-verbalized understanding and made follow up appointment.

## 2020-01-08 ENCOUNTER — Inpatient Hospital Stay: Payer: Medicare (Managed Care)

## 2020-01-08 ENCOUNTER — Emergency Department: Payer: Medicare (Managed Care)

## 2020-01-08 ENCOUNTER — Other Ambulatory Visit: Payer: Self-pay

## 2020-01-08 ENCOUNTER — Inpatient Hospital Stay
Admission: EM | Admit: 2020-01-08 | Discharge: 2020-01-29 | DRG: 640 | Disposition: A | Discharge status: Skilled Nursing Facility | Payer: Medicare (Managed Care) | Attending: Student | Admitting: Student

## 2020-01-08 DIAGNOSIS — F99 Mental disorder, not otherwise specified: Secondary | ICD-10-CM

## 2020-01-08 DIAGNOSIS — I639 Cerebral infarction, unspecified: Secondary | ICD-10-CM | POA: Diagnosis present

## 2020-01-08 DIAGNOSIS — N400 Enlarged prostate without lower urinary tract symptoms: Secondary | ICD-10-CM | POA: Diagnosis not present

## 2020-01-08 DIAGNOSIS — R609 Edema, unspecified: Secondary | ICD-10-CM

## 2020-01-08 DIAGNOSIS — I6300 Cerebral infarction due to thrombosis of unspecified precerebral artery: Secondary | ICD-10-CM | POA: Diagnosis not present

## 2020-01-08 DIAGNOSIS — E872 Acidosis, unspecified: Secondary | ICD-10-CM | POA: Diagnosis present

## 2020-01-08 DIAGNOSIS — R4701 Aphasia: Secondary | ICD-10-CM | POA: Diagnosis present

## 2020-01-08 DIAGNOSIS — E785 Hyperlipidemia, unspecified: Secondary | ICD-10-CM | POA: Diagnosis present

## 2020-01-08 DIAGNOSIS — G9341 Metabolic encephalopathy: Secondary | ICD-10-CM | POA: Diagnosis present

## 2020-01-08 DIAGNOSIS — Z79899 Other long term (current) drug therapy: Secondary | ICD-10-CM

## 2020-01-08 DIAGNOSIS — Z823 Family history of stroke: Secondary | ICD-10-CM

## 2020-01-08 DIAGNOSIS — K269 Duodenal ulcer, unspecified as acute or chronic, without hemorrhage or perforation: Secondary | ICD-10-CM | POA: Diagnosis present

## 2020-01-08 DIAGNOSIS — Z8673 Personal history of transient ischemic attack (TIA), and cerebral infarction without residual deficits: Secondary | ICD-10-CM

## 2020-01-08 DIAGNOSIS — R64 Cachexia: Secondary | ICD-10-CM | POA: Diagnosis present

## 2020-01-08 DIAGNOSIS — R4182 Altered mental status, unspecified: Secondary | ICD-10-CM | POA: Diagnosis not present

## 2020-01-08 DIAGNOSIS — R1312 Dysphagia, oropharyngeal phase: Secondary | ICD-10-CM | POA: Diagnosis not present

## 2020-01-08 DIAGNOSIS — B964 Proteus (mirabilis) (morganii) as the cause of diseases classified elsewhere: Secondary | ICD-10-CM | POA: Diagnosis not present

## 2020-01-08 DIAGNOSIS — G9340 Encephalopathy, unspecified: Secondary | ICD-10-CM | POA: Diagnosis not present

## 2020-01-08 DIAGNOSIS — Z8546 Personal history of malignant neoplasm of prostate: Secondary | ICD-10-CM

## 2020-01-08 DIAGNOSIS — E876 Hypokalemia: Secondary | ICD-10-CM | POA: Diagnosis present

## 2020-01-08 DIAGNOSIS — R778 Other specified abnormalities of plasma proteins: Secondary | ICD-10-CM | POA: Diagnosis present

## 2020-01-08 DIAGNOSIS — Z8616 Personal history of COVID-19: Secondary | ICD-10-CM | POA: Diagnosis not present

## 2020-01-08 DIAGNOSIS — I10 Essential (primary) hypertension: Secondary | ICD-10-CM | POA: Diagnosis present

## 2020-01-08 DIAGNOSIS — E860 Dehydration: Secondary | ICD-10-CM | POA: Diagnosis present

## 2020-01-08 DIAGNOSIS — E871 Hypo-osmolality and hyponatremia: Secondary | ICD-10-CM | POA: Diagnosis not present

## 2020-01-08 DIAGNOSIS — L89616 Pressure-induced deep tissue damage of right heel: Secondary | ICD-10-CM | POA: Diagnosis not present

## 2020-01-08 DIAGNOSIS — E870 Hyperosmolality and hypernatremia: Secondary | ICD-10-CM | POA: Diagnosis present

## 2020-01-08 DIAGNOSIS — L89626 Pressure-induced deep tissue damage of left heel: Secondary | ICD-10-CM | POA: Diagnosis not present

## 2020-01-08 DIAGNOSIS — F79 Unspecified intellectual disabilities: Secondary | ICD-10-CM | POA: Diagnosis present

## 2020-01-08 DIAGNOSIS — N136 Pyonephrosis: Secondary | ICD-10-CM | POA: Diagnosis not present

## 2020-01-08 DIAGNOSIS — N179 Acute kidney failure, unspecified: Secondary | ICD-10-CM | POA: Diagnosis not present

## 2020-01-08 DIAGNOSIS — R310 Gross hematuria: Secondary | ICD-10-CM

## 2020-01-08 DIAGNOSIS — R627 Adult failure to thrive: Secondary | ICD-10-CM | POA: Diagnosis present

## 2020-01-08 DIAGNOSIS — Z7189 Other specified counseling: Secondary | ICD-10-CM

## 2020-01-08 DIAGNOSIS — Z66 Do not resuscitate: Secondary | ICD-10-CM | POA: Diagnosis present

## 2020-01-08 DIAGNOSIS — Z87440 Personal history of urinary (tract) infections: Secondary | ICD-10-CM

## 2020-01-08 DIAGNOSIS — K2900 Acute gastritis without bleeding: Secondary | ICD-10-CM | POA: Diagnosis present

## 2020-01-08 DIAGNOSIS — U071 COVID-19: Secondary | ICD-10-CM | POA: Diagnosis present

## 2020-01-08 DIAGNOSIS — N3001 Acute cystitis with hematuria: Secondary | ICD-10-CM | POA: Diagnosis present

## 2020-01-08 DIAGNOSIS — Z8249 Family history of ischemic heart disease and other diseases of the circulatory system: Secondary | ICD-10-CM

## 2020-01-08 DIAGNOSIS — N170 Acute kidney failure with tubular necrosis: Secondary | ICD-10-CM | POA: Diagnosis present

## 2020-01-08 DIAGNOSIS — Z515 Encounter for palliative care: Secondary | ICD-10-CM | POA: Diagnosis present

## 2020-01-08 DIAGNOSIS — D638 Anemia in other chronic diseases classified elsewhere: Secondary | ICD-10-CM | POA: Diagnosis present

## 2020-01-08 DIAGNOSIS — K2100 Gastro-esophageal reflux disease with esophagitis, without bleeding: Secondary | ICD-10-CM | POA: Diagnosis present

## 2020-01-08 DIAGNOSIS — E43 Unspecified severe protein-calorie malnutrition: Secondary | ICD-10-CM | POA: Diagnosis present

## 2020-01-08 DIAGNOSIS — Z833 Family history of diabetes mellitus: Secondary | ICD-10-CM

## 2020-01-08 DIAGNOSIS — Z87891 Personal history of nicotine dependence: Secondary | ICD-10-CM

## 2020-01-08 DIAGNOSIS — Z6823 Body mass index (BMI) 23.0-23.9, adult: Secondary | ICD-10-CM

## 2020-01-08 DIAGNOSIS — N3289 Other specified disorders of bladder: Secondary | ICD-10-CM | POA: Diagnosis present

## 2020-01-08 LAB — COMPREHENSIVE METABOLIC PANEL
ALT: 26 U/L (ref 0–44)
AST: 39 U/L (ref 15–41)
Albumin: 4 g/dL (ref 3.5–5.0)
Alkaline Phosphatase: 67 U/L (ref 38–126)
Anion gap: 16 — ABNORMAL HIGH (ref 5–15)
BUN: 146 mg/dL — ABNORMAL HIGH (ref 8–23)
CO2: 29 mmol/L (ref 22–32)
Calcium: 10.4 mg/dL — ABNORMAL HIGH (ref 8.9–10.3)
Chloride: 125 mmol/L — ABNORMAL HIGH (ref 98–111)
Creatinine, Ser: 3.15 mg/dL — ABNORMAL HIGH (ref 0.61–1.24)
GFR calc Af Amer: 22 mL/min — ABNORMAL LOW (ref >60)
GFR calc non Af Amer: 19 mL/min — ABNORMAL LOW (ref >60)
Glucose, Bld: 151 mg/dL — ABNORMAL HIGH (ref 70–99)
Potassium: 4.2 mmol/L (ref 3.5–5.1)
Sodium: 170 mmol/L (ref 135–145)
Total Bilirubin: 1 mg/dL (ref 0.3–1.2)
Total Protein: 9.3 g/dL — ABNORMAL HIGH (ref 6.5–8.1)

## 2020-01-08 LAB — CBC WITH DIFFERENTIAL/PLATELET
Abs Immature Granulocytes: 0.04 10*3/uL (ref 0.00–0.07)
Basophils Absolute: 0 10*3/uL (ref 0.0–0.1)
Basophils Relative: 0 %
Eosinophils Absolute: 0 10*3/uL (ref 0.0–0.5)
Eosinophils Relative: 0 %
HCT: 68.1 % — ABNORMAL HIGH (ref 39.0–52.0)
Hemoglobin: 19.8 g/dL — ABNORMAL HIGH (ref 13.0–17.0)
Immature Granulocytes: 1 %
Lymphocytes Relative: 15 %
Lymphs Abs: 1.3 10*3/uL (ref 0.7–4.0)
MCH: 27.8 pg (ref 26.0–34.0)
MCHC: 29.1 g/dL — ABNORMAL LOW (ref 30.0–36.0)
MCV: 95.8 fL (ref 80.0–100.0)
Monocytes Absolute: 1 10*3/uL (ref 0.1–1.0)
Monocytes Relative: 12 %
Neutro Abs: 6.2 10*3/uL (ref 1.7–7.7)
Neutrophils Relative %: 72 %
Platelets: 160 10*3/uL (ref 150–400)
RBC: 7.11 MIL/uL — ABNORMAL HIGH (ref 4.22–5.81)
RDW: 17.1 % — ABNORMAL HIGH (ref 11.5–15.5)
WBC: 8.6 10*3/uL (ref 4.0–10.5)
nRBC: 0.2 % (ref 0.0–0.2)

## 2020-01-08 LAB — TROPONIN I (HIGH SENSITIVITY)
Troponin I (High Sensitivity): 20 ng/L — ABNORMAL HIGH (ref <18)
Troponin I (High Sensitivity): 20 ng/L — ABNORMAL HIGH (ref <18)
Troponin I (High Sensitivity): 18 ng/L — ABNORMAL HIGH (ref <18)

## 2020-01-08 LAB — LACTIC ACID, PLASMA
Lactic Acid, Venous: 3.7 mmol/L (ref 0.5–1.9)
Lactic Acid, Venous: 3.9 mmol/L (ref 0.5–1.9)
Lactic Acid, Venous: 4.2 mmol/L (ref 0.5–1.9)

## 2020-01-08 LAB — URINALYSIS, COMPLETE (UACMP) WITH MICROSCOPIC
Bacteria, UA: NONE SEEN
Bilirubin Urine: NEGATIVE
Glucose, UA: NEGATIVE mg/dL
Hgb urine dipstick: NEGATIVE
Ketones, ur: NEGATIVE mg/dL
Leukocytes,Ua: NEGATIVE
Nitrite: NEGATIVE
Protein, ur: NEGATIVE mg/dL
Specific Gravity, Urine: 1.02 (ref 1.005–1.030)
pH: 5 (ref 5.0–8.0)

## 2020-01-08 LAB — TSH: TSH: 1.749 u[IU]/mL (ref 0.350–4.500)

## 2020-01-08 LAB — BASIC METABOLIC PANEL
Anion gap: 22 — ABNORMAL HIGH (ref 5–15)
BUN: 162 mg/dL — ABNORMAL HIGH (ref 8–23)
CO2: 20 mmol/L — ABNORMAL LOW (ref 22–32)
Calcium: 10.1 mg/dL (ref 8.9–10.3)
Chloride: 128 mmol/L — ABNORMAL HIGH (ref 98–111)
Creatinine, Ser: 3.05 mg/dL — ABNORMAL HIGH (ref 0.61–1.24)
GFR calc Af Amer: 23 mL/min — ABNORMAL LOW (ref >60)
GFR calc non Af Amer: 20 mL/min — ABNORMAL LOW (ref >60)
Glucose, Bld: 139 mg/dL — ABNORMAL HIGH (ref 70–99)
Potassium: 4 mmol/L (ref 3.5–5.1)
Sodium: 170 mmol/L (ref 135–145)

## 2020-01-08 LAB — MRSA PCR SCREENING: MRSA by PCR: NEGATIVE

## 2020-01-08 LAB — SODIUM, URINE, RANDOM: Sodium, Ur: 10 mmol/L

## 2020-01-08 LAB — BRAIN NATRIURETIC PEPTIDE: B Natriuretic Peptide: 35 pg/mL (ref 0.0–100.0)

## 2020-01-08 LAB — OSMOLALITY, URINE: Osmolality, Ur: 703 mOsm/kg (ref 300–900)

## 2020-01-08 MED ORDER — ACETAMINOPHEN 325 MG PO TABS
650.0000 mg | ORAL_TABLET | Freq: Four times a day (QID) | ORAL | Status: DC | PRN
  Filled 2020-01-08: qty 2

## 2020-01-08 MED ORDER — ONDANSETRON HCL 4 MG/2ML IJ SOLN
4.0000 mg | Freq: Three times a day (TID) | INTRAMUSCULAR | Status: DC | PRN
  Administered 2020-01-19 – 2020-01-20 (×3): 4 mg via INTRAVENOUS
  Filled 2020-01-08 (×3): qty 2

## 2020-01-08 MED ORDER — SODIUM CHLORIDE 0.9 % IV BOLUS
1000.0000 mL | Freq: Once | INTRAVENOUS | Status: AC
  Administered 2020-01-08: 1000 mL via INTRAVENOUS

## 2020-01-08 MED ORDER — INFLUENZA VAC A&B SA ADJ QUAD 0.5 ML IM PRSY
0.5000 mL | PREFILLED_SYRINGE | INTRAMUSCULAR | Status: DC
  Filled 2020-01-08: qty 0.5

## 2020-01-08 MED ORDER — DEXTROSE 5 % IV SOLN: INTRAVENOUS | Status: DC

## 2020-01-08 MED ORDER — HEPARIN SODIUM (PORCINE) 5000 UNIT/ML IJ SOLN
5000.0000 [IU] | Freq: Three times a day (TID) | INTRAMUSCULAR | Status: DC
  Administered 2020-01-08 – 2020-01-11 (×7): 5000 [IU] via SUBCUTANEOUS
  Filled 2020-01-08 (×7): qty 1

## 2020-01-08 MED ORDER — ACETAMINOPHEN 650 MG RE SUPP
650.0000 mg | Freq: Four times a day (QID) | RECTAL | Status: DC | PRN
  Administered 2020-01-22: 14:00:00 650 mg via RECTAL
  Filled 2020-01-08 (×2): qty 1

## 2020-01-08 MED ORDER — PNEUMOCOCCAL VAC POLYVALENT 25 MCG/0.5ML IJ INJ: 0.5000 mL | INJECTION | INTRAMUSCULAR | Status: DC

## 2020-01-08 MED ORDER — ALBUTEROL SULFATE (2.5 MG/3ML) 0.083% IN NEBU: 3.0000 mL | INHALATION_SOLUTION | RESPIRATORY_TRACT | Status: DC | PRN

## 2020-01-08 MED ORDER — HYDRALAZINE HCL 20 MG/ML IJ SOLN: 5.0000 mg | INTRAMUSCULAR | Status: DC | PRN

## 2020-01-08 MED ORDER — SODIUM CHLORIDE 0.9 % IV BOLUS
500.0000 mL | Freq: Once | INTRAVENOUS | Status: AC
  Administered 2020-01-08: 17:00:00 500 mL via INTRAVENOUS

## 2020-01-08 NOTE — Plan of Care (Signed)
  Problem: Education: Goal: Knowledge of General Education information will improve Description Including pain rating scale, medication(s)/side effects and non-pharmacologic comfort measures Outcome: Progressing   

## 2020-01-08 NOTE — ED Provider Notes (Signed)
[facility] Emergency Department Provider Note ____________________________________________   First MD Initiated Contact with Patient 01/08/20 1010     (approximate)  I have reviewed the triage vital signs and the nursing notes.   HISTORY  Chief Complaint Altered Mental Status  Level 5 caveat: History of present illness limited due to aphasia  HPI Samuel Shelton is a 69 y.o. male with PMH as noted below including a history of stroke and intellectual disability who presents with apparent altered mental status, characterized by decreased responsiveness.  The facility staff is also concerned that his skin was cool, and the patient was hypoxic in the 60s.  EMS reports that when they arrived, the patient had an oxygen mask on but no oxygen was flowing.  They put him on 15 L by nonrebreather as a precaution.  The patient is nonverbal and is unable to give history.  He also recently tested positive for COVID-19 on 12/18, however EMS reports that he had been improving.  Past Medical History:  Diagnosis Date  . Chronic mental illness   . Edema of both legs   . Elevated PSA   . Elevated PSA   . Hypertension   . Mental retardation   . Phimosis     Patient Active Problem List   Diagnosis Date Noted  . Hypernatremia 01/08/2020  . Hypercalcemia 01/08/2020  . Acute metabolic encephalopathy [id number]  . COVID-19 virus infection 01/08/2020  . AKI (acute kidney injury) ([place]) 01/08/2020  . Altered mental status   . Hyperlipidemia 01/29/2019  . Family hx-stroke 01/29/2019  . BPH (benign prostatic hyperplasia) 01/29/2019  . Stroke (cerebrum) ([place]) 01/28/2019  . Edema leg 08/20/2014  . Chronic mental disorder 08/20/2014  . BP (high blood pressure) 08/20/2014  . Intellectual disability 08/20/2014    Past Surgical History:  Procedure Laterality Date  . PROSTATE BIOPSY N/A 06/22/2015   Procedure: PROSTATE BIOPSY;  Surgeon: Hollice Espy, MD;  Location: [facility] ORS;   Service: Urology;  Laterality: N/A;  . PROSTATE BIOPSY N/A 09/27/2016   Procedure: PROSTATE BIOPSY;  Surgeon: Hollice Espy, MD;  Location: [facility] ORS;  Service: Urology;  Laterality: N/A;    Prior to Admission medications   Medication Sig Start Date End Date Taking? Authorizing Provider  acetaminophen (TYLENOL) 500 MG tablet Take 500 mg by mouth every 4 (four) hours as needed for mild pain.   Yes [provider]  aspirin 81 MG chewable tablet Chew 1 tablet (81 mg total) by mouth daily. 02/01/19  Yes Metzger-Cihelka, Desiree, NP  atorvastatin (LIPITOR) 40 MG tablet Take 1 tablet (40 mg total) by mouth daily at 6 PM. 01/31/19  Yes Metzger-Cihelka, Desiree, NP  finasteride (PROSCAR) 5 MG tablet Take 1 tablet (5 mg total) by mouth daily. 09/05/18  Yes Hollice Espy, MD  hydrochlorothiazide (HYDRODIURIL) 12.5 MG tablet Take 25 mg by mouth daily.    Yes [provider]  Melatonin 3 MG TABS Take 3 mg by mouth at bedtime.   Yes [provider]  tamsulosin (FLOMAX) 0.4 MG CAPS capsule Take 1 capsule (0.4 mg total) by mouth daily. 10/27/17  Yes Hollice Espy, MD    Allergies Patient has no known allergies.  Family History  Problem Relation Age of Onset  . Stroke Mother   . Hypertension Sister   . Diabetes type II Sister     Social History Social History   Tobacco Use  . Smoking status: Former Smoker    Packs/day: 0.50    Types: Cigarettes  .  Smokeless tobacco: Never Used  Substance Use Topics  . Alcohol use: No    Alcohol/week: 0.0 standard drinks  . Drug use: No    Review of Systems Level 5 caveat: Unable to obtain review of systems due to aphasia    ____________________________________________   PHYSICAL EXAM:  VITAL SIGNS: ED Triage Vitals  Enc Vitals Group     BP 01/08/20 0949 (!) 122/96     Pulse Rate 01/08/20 0951 89     Resp 01/08/20 0951 12     Temp 01/08/20 0951 98.1 F (36.7 C)     Temp Source 01/08/20 0951 Rectal     SpO2 01/08/20  0951 99 %     Weight 01/08/20 0950 188 lb 15 oz (85.7 kg)     Height 01/08/20 0950 6' (1.829 m)     Head Circumference --      Peak Flow --      Pain Score --      Pain Loc --      Pain Edu? --      Excl. in [place]? --     Constitutional: Alert, responsive.  Weak appearing. Eyes: Conjunctivae are normal.  EOMI.  PERRLA. Head: Atraumatic. Nose: No congestion/rhinnorhea. Mouth/Throat: Mucous membranes are dry.   Neck: Normal range of motion.  Cardiovascular: Normal rate, regular rhythm. Grossly normal heart sounds.  Good peripheral circulation. Respiratory: Normal respiratory effort.  No retractions. Lungs CTAB. Gastrointestinal: Soft and nontender. No distention.  Genitourinary: No flank tenderness. Musculoskeletal: No lower extremity edema.  Extremities warm and well perfused.  Neurologic: Moving all extremities with some chronic right-sided weakness. Skin:  Skin is warm and dry. No rash noted. Psychiatric: Calm and cooperative.  ____________________________________________   LABS (all labs ordered are listed, but only abnormal results are displayed)  Labs Reviewed  COMPREHENSIVE METABOLIC PANEL - Abnormal; Notable for the following components:      Result Value   Sodium 170 (*)    Chloride 125 (*)    Glucose, Bld 151 (*)    BUN 146 (*)    Creatinine, Ser 3.15 (*)    Calcium 10.4 (*)    Total Protein 9.3 (*)    GFR calc non Af Amer 19 (*)    GFR calc Af Amer 22 (*)    Anion gap 16 (*)    All other components within normal limits  LACTIC ACID, PLASMA - Abnormal; Notable for the following components:   Lactic Acid, Venous 3.7 (*)    All other components within normal limits  CBC WITH DIFFERENTIAL/PLATELET - Abnormal; Notable for the following components:   RBC 7.11 (*)    Hemoglobin 19.8 (*)    HCT 68.1 (*)    MCHC 29.1 (*)    RDW 17.1 (*)    All other components within normal limits  TROPONIN I (HIGH SENSITIVITY) - Abnormal; Notable for the following components:    Troponin I (High Sensitivity) 20 (*)    All other components within normal limits  TROPONIN I (HIGH SENSITIVITY) - Abnormal; Notable for the following components:   Troponin I (High Sensitivity) 20 (*)    All other components within normal limits  BRAIN NATRIURETIC PEPTIDE  URINALYSIS, COMPLETE (UACMP) WITH MICROSCOPIC  LACTIC ACID, PLASMA  BASIC METABOLIC PANEL  BASIC METABOLIC PANEL  BASIC METABOLIC PANEL  BASIC METABOLIC PANEL  TROPONIN I (HIGH SENSITIVITY)   ____________________________________________  EKG  ED ECG REPORT I, Arta Silence, the attending physician, personally viewed and interpreted this ECG.  Date: 01/08/2020 EKG Time: 0956 Rate: 88 Rhythm: normal sinus rhythm QRS Axis: normal Intervals: normal ST/T Wave abnormalities: Nonspecific T wave inversions inferiorly Narrative Interpretation: Nonspecific abnormalities with no evidence of acute ischemia  ____________________________________________  RADIOLOGY  CT head: No acute abnormality CXR: Bilateral interstitial lung markings  ____________________________________________   PROCEDURES  Procedure(s) performed: No  Procedures  Critical Care performed: Yes  CRITICAL CARE Performed by: Arta Silence   Total critical care time: 35 minutes  Critical care time was exclusive of separately billable procedures and treating other patients.  Critical care was necessary to treat or prevent imminent or life-threatening deterioration.  Critical care was time spent personally by me on the following activities: development of treatment plan with patient and/or surrogate as well as nursing, discussions with consultants, evaluation of patient's response to treatment, examination of patient, obtaining history from patient or surrogate, ordering and performing treatments and interventions, ordering and review of laboratory studies, ordering and review of radiographic studies, pulse oximetry and  re-evaluation of patient's condition. ____________________________________________   INITIAL IMPRESSION / ASSESSMENT AND PLAN / ED COURSE  Pertinent labs & imaging results that were available during my care of the patient were reviewed by me and considered in my medical decision making (see chart for details).  69 year old male with PMH as noted above including a stroke with right-sided weakness and who is nonverbal at baseline presents to the ER with decreased responsiveness and possible hypoxia.  The patient is unable to give further history.  EMS found that the patient had an oxygen mask on that apparently was not connected to oxygen.  On exam, the patient is somewhat chronically ill and weak appearing but in no acute distress.  He is alert and responsive.  He does have residual right-sided weakness but no other acute focal neurologic deficits.  His vital signs are normal.  O2 saturation is 100% on room air when he was taken off of oxygen.  Differential for the patient's increased weakness and decreased responsiveness is broad but includes dehydration, other metabolic abnormality, UTI or other infection, residual symptoms after recent COVID-19 infection, or possible cardiac or CNS cause.  We will obtain CT head, chest x-ray, lab work-up including urinalysis, and reassess.  ----------------------------------------- 1:48 PM on 01/08/2020 -----------------------------------------  CT head is negative for acute findings.  Chest x-ray shows bilateral opacities consistent with the patient's recent bout with COVID-19.  Lab work-up is significant for hypernatremia.  Overall, this appears to be hypovolemic in nature, given the patient's dry mucous membranes and clinical appearance, as well as the significant elevated BUN and creatinine and hemoconcentration seen on the CBC.  I have ordered a bolus of normal saline to start the treatment for hypernatremia.  The patient will require admission.  I  discussed the case with Dr. Blaine Hamper for admission to the hospitalist service.  ____________________________________________   FINAL CLINICAL IMPRESSION(S) / ED DIAGNOSES  Final diagnoses:  Altered mental status, unspecified altered mental status type  Hypernatremia      NEW MEDICATIONS STARTED DURING THIS VISIT:  New Prescriptions   No medications on file     Note:  This document was prepared using Dragon voice recognition software and may include unintentional dictation errors.    Arta Silence, MD 01/08/20 1349

## 2020-01-08 NOTE — ED Notes (Signed)
In and out attempted without success. Condom cath placed on pt at this time to collect urine sample

## 2020-01-08 NOTE — H&P (Addendum)
History and Physical    Samuel Shelton [id number] DOB: 13-Dec-1951 DOA: 01/08/2020  Referring MD/NP/PA:   PCP: Sofie Hartigan, MD   Patient coming from:  The patient is coming from SNF.  At baseline, pt is dependent for most of ADL.        Chief Complaint: AMS  HPI: Samuel Shelton is a 69 y.o. male with medical history significant of mental retardation, hypertension, hyperlipidemia, stroke, BPH, who presents with altered mental status.  The patient is nonverbal and is unable to give history. Per his niece (I called his niece by phone), patient had stroke in 2019 and pt has not been able to talk normally since then.  His niece does not know exactly what happened today.  Per report, pt was noted to have worsening mental status recently and has decreased oral intake and decreased responsiveness than baseline. Pt opens his eyes spontaneously, and was moving face mask off his face. Pt had positive Covid 19 test on 12/13/19. Per EMS, pt was noted to have oxygen sat 65% on room air. On arrival pt on face mask at 15L oxygen sat 96%, but then oxygen saturation improved to 94-99% on room air in ED. No active nausea vomiting, diarrhea noted.  No facial droop.  ED Course: pt was found to have sodium 170, troponin trending, trending, WBC 8.6, AKI with creatinine 3.015, BUN 146, potassium 4.2, temperature normal, blood pressure 115/98, heart rate 89, RR 21, oxygen saturation 94% on room air currently.  Chest x-ray showed a bilateral interstitial marking without obvious infiltration.  CT head is negative for acute intracranial abnormalities.  Review of Systems: Cannot be reviewed due to altered mental status.  Allergy: No Known Allergies  Past Medical History:  Diagnosis Date  . Chronic mental illness   . Edema of both legs   . Elevated PSA   . Elevated PSA   . Hypertension   . Mental retardation   . Phimosis     Past Surgical History:  Procedure Laterality Date  . PROSTATE BIOPSY N/A  06/22/2015   Procedure: PROSTATE BIOPSY;  Surgeon: Hollice Espy, MD;  Location: [facility] ORS;  Service: Urology;  Laterality: N/A;  . PROSTATE BIOPSY N/A 09/27/2016   Procedure: PROSTATE BIOPSY;  Surgeon: Hollice Espy, MD;  Location: [facility] ORS;  Service: Urology;  Laterality: N/A;    Social History:  reports that he has quit smoking. His smoking use included cigarettes. He smoked 0.50 packs per day. He has never used smokeless tobacco. He reports that he does not drink alcohol or use drugs.  Family History:  Family History  Problem Relation Age of Onset  . Stroke Mother   . Hypertension Sister   . Diabetes type II Sister      Prior to Admission medications   Medication Sig Start Date End Date Taking? Authorizing Provider  acetaminophen (TYLENOL) 500 MG tablet Take 500 mg by mouth every 4 (four) hours as needed for mild pain.    [provider]  alum & mag hydroxide-simeth ([place]) 200-200-20 MG/5ML suspension Take 30 mLs by mouth every 6 (six) hours as needed for indigestion or heartburn.    [provider]  aspirin 81 MG chewable tablet Chew 1 tablet (81 mg total) by mouth daily. 02/01/19   Metzger-Cihelka, York Cerise, NP  atorvastatin (LIPITOR) 40 MG tablet Take 1 tablet (40 mg total) by mouth daily at 6 PM. 01/31/19   Metzger-Cihelka, York Cerise, NP  clopidogrel (PLAVIX) 75 MG tablet Take 1 tablet (75 mg total)  by mouth daily. Patient not taking: Reported on 09/04/2019 02/01/19   Metzger-Cihelka, York Cerise, NP  finasteride (PROSCAR) 5 MG tablet Take 1 tablet (5 mg total) by mouth daily. 09/05/18   Hollice Espy, MD  guaifenesin (ROBITUSSIN) 100 MG/5ML syrup Take 200 mg by mouth 3 (three) times daily as needed for cough.    [provider]  hydrochlorothiazide (HYDRODIURIL) 12.5 MG tablet Take 12.5 mg by mouth.  03/02/15   [provider]  loperamide (IMODIUM) 2 MG capsule Take 2 mg by mouth daily as needed for diarrhea or loose stools.    [provider]    magnesium hydroxide (MILK OF MAGNESIA) 400 MG/5ML suspension Take 30 mLs by mouth at bedtime as needed for mild constipation.    [provider]  neomycin-bacitracin-polymyxin (NEOSPORIN) OINT Apply 1 application topically daily as needed for irritation or wound care.    [provider]  tamsulosin (FLOMAX) 0.4 MG CAPS capsule Take 1 capsule (0.4 mg total) by mouth daily. 10/27/17   Hollice Espy, MD    Physical Exam: Vitals:   01/08/20 1106 01/08/20 1130 01/08/20 1430 01/08/20 1625  BP: (!) 113/92 (!) 115/98 (!) 124/93 (!) 125/94  Pulse:    84  Resp: 18 (!) 21 11 18   Temp:    97.6 F (36.4 C)  TempSrc:    Oral  SpO2:    100%  Weight:      Height:       General: Not in acute distress.  Dry mucus and membrane HEENT:       Eyes: PERRL, EOMI, no scleral icterus.       ENT: No discharge from the ears and nose, no pharynx injection, no tonsillar enlargement.        Neck: No JVD, no bruit, no mass felt. Heme: No neck lymph node enlargement. Cardiac: S1/S2, RRR, No murmurs, No gallops or rubs. Respiratory: No rales, wheezing, rhonchi or rubs. GI: Soft, nondistended, nontender,  no organomegaly, BS present. GU: No hematuria Ext: No pitting leg edema bilaterally. 2+DP/PT pulse bilaterally. Musculoskeletal: No joint deformities, No joint redness or warmth, no limitation of ROM in spin. Skin: No rashes.  Neuro: nonverbal, not following command, moves extremities. Psych: Patient is not psychotic Labs on Admission: I have personally reviewed following labs and imaging studies  CBC: Recent Labs  Lab 01/08/20 1118  WBC 8.6  NEUTROABS 6.2  HGB 19.8*  HCT 68.1*  MCV 95.8  PLT 0000000   Basic Metabolic Panel: Recent Labs  Lab 01/08/20 1118 01/08/20 1314  NA 170* 170*  K 4.2 4.0  CL 125* 128*  CO2 29 20*  GLUCOSE 151* 139*  BUN 146* 162*  CREATININE 3.15* 3.05*  CALCIUM 10.4* 10.1   GFR: Estimated Creatinine Clearance: 24.7 mL/min (A) (by C-G formula based  on SCr of 3.05 mg/dL (H)). Liver Function Tests: Recent Labs  Lab 01/08/20 1118  AST 39  ALT 26  ALKPHOS 67  BILITOT 1.0  PROT 9.3*  ALBUMIN 4.0   No results for input(s): LIPASE, AMYLASE in the last 168 hours. No results for input(s): AMMONIA in the last 168 hours. Coagulation Profile: No results for input(s): INR, PROTIME in the last 168 hours. Cardiac Enzymes: No results for input(s): CKTOTAL, CKMB, CKMBINDEX, TROPONINI in the last 168 hours. BNP (last 3 results) No results for input(s): PROBNP in the last 8760 hours. HbA1C: No results for input(s): HGBA1C in the last 72 hours. CBG: No results for input(s): GLUCAP in the last 168 hours.  Lipid Profile: No results for input(s): CHOL, HDL, LDLCALC, TRIG, CHOLHDL, LDLDIRECT in the last 72 hours. Thyroid Function Tests: No results for input(s): TSH, T4TOTAL, FREET4, T3FREE, THYROIDAB in the last 72 hours. Anemia Panel: No results for input(s): VITAMINB12, FOLATE, FERRITIN, TIBC, IRON, RETICCTPCT in the last 72 hours. Urine analysis:    Component Value Date/Time   COLORURINE YELLOW (A) 08/09/2018 0216   APPEARANCEUR Hazy (A) 09/04/2019 1051   LABSPEC 1.018 08/09/2018 0216   PHURINE 7.0 08/09/2018 0216   GLUCOSEU Negative 09/04/2019 1051   HGBUR NEGATIVE 08/09/2018 0216   BILIRUBINUR Negative 09/04/2019 1051   KETONESUR NEGATIVE 08/09/2018 0216   PROTEINUR Negative 09/04/2019 1051   PROTEINUR NEGATIVE 08/09/2018 0216   NITRITE Negative 09/04/2019 1051   NITRITE NEGATIVE 08/09/2018 0216   LEUKOCYTESUR Negative 09/04/2019 1051   Sepsis Labs: @LABRCNTIP (procalcitonin:4,lacticidven:4) )No results found for this or any previous visit (from the past 240 hour(s)).   Radiological Exams on Admission: CT Head Wo Contrast  Result Date: 01/08/2020 CLINICAL DATA:  Altered mental status. EXAM: CT HEAD WITHOUT CONTRAST TECHNIQUE: Contiguous axial images were obtained from the base of the skull through the vertex without intravenous  contrast. COMPARISON:  January 28, 2019. January 27, 2019. FINDINGS: Brain: Mild diffuse cortical atrophy is noted. Mild chronic ischemic white matter disease is noted. No mass effect or midline shift is noted. Ventricular size is within normal limits. There is no evidence of mass lesion, hemorrhage or acute infarction. Vascular: No hyperdense vessel or unexpected calcification. Skull: Normal. Negative for fracture or focal lesion. Sinuses/Orbits: No acute finding. Other: None. IMPRESSION: Mild diffuse cortical atrophy. Mild chronic ischemic white matter disease. No acute intracranial abnormality seen. Electronically Signed   By: Marijo Conception M.D.   On: 01/08/2020 10:40   US RENAL  Result Date: 01/08/2020 CLINICAL DATA:  Acute kidney injury EXAM: RENAL / URINARY TRACT ULTRASOUND COMPLETE COMPARISON:  None. FINDINGS: Right Kidney: Renal measurements: 9.3 x 5 9 x 5.2 cm = volume: 150.4 mL . Echogenicity within normal limits. No mass or hydronephrosis visualized. Left Kidney: Renal measurements: 9.8 x 6.7 x 6.1 cm = volume: 209.2 mL. Echogenicity within normal limits. No mass or hydronephrosis visualized. Bladder: Appears normal for degree of bladder distention. Other: Technically difficult exam due to reported difficulties with patient positioning. IMPRESSION: Unremarkable renal ultrasound. Electronically Signed   By: Lovena Le M.D.   On: 01/08/2020 15:38   DG Chest Portable 1 View  Result Date: 01/08/2020 CLINICAL DATA:  Hypoxia EXAM: PORTABLE CHEST 1 VIEW COMPARISON:  None. FINDINGS: The heart size and mediastinal contours are within normal limits. Tortuosity of the thoracic aorta. Prominent bilateral interstitial lung markings without focal consolidation. No pleural effusion or pneumothorax. The visualized skeletal structures are unremarkable. IMPRESSION: 1. Prominent bilateral interstitial lung markings which could reflect edema versus atypical/viral infection. 2. Tortuosity of the thoracic aorta.  Electronically Signed   By: Davina Poke D.O.   On: 01/08/2020 12:31     EKG: Independently reviewed.  Sinus rhythm, QTC 470, T wave inversion in inferior leads and V5-V6.  Assessment/Plan Principal Problem:   Hypernatremia Active Problems:   Chronic mental disorder   HTN (hypertension)   Stroke (cerebrum) (HCC)   Hyperlipidemia   BPH (benign prostatic hyperplasia)   Hypercalcemia   Acute metabolic encephalopathy   COVID-19 virus infection   AKI (acute kidney injury) (HCC)   Dehydration   Elevated troponin   Lactic acidosis   Hypernatremia: Na 170.  Not very sure how acute  it is. Free water deficit is 9.3 L per Dr. Juleen China. Patient received 1.0 L of normal saline in emergency room. Will give another 500 cc of NS. Will correct Na by ~0.4 mEq/h or ~10 mEq/24hour. Renal, Dr. Juleen China was consulted  -admit to tele bed as inpt -will start D5W at 100 cc/h -->need to make adjustment depending on BMP results -check BMP q6h -check urine and plasma Osmo and urine sodium level, TSH  Dehydration: -on IVF as above -hold home HCTZ  Chronic mental disorder and acute metabolic encephalopathy: CT head is negative for acute intracranial abnormalities, likely due to electrolytes disturbance and worsening renal function -Frequent neuro check -hold all oral meds and keep pt NPO until mental status improves  HTN: -Hold oral medications -IV hydralazine as needed  Hx of Stroke (cerebrum) ([place]): -hold ASA and lipitor now  Hyperlipidemia: -hold lipitor now  BPH (benign prostatic hyperplasia): -hold home Proscar and Flomax  Hypercalcemia: Ca 10.4, likely due to dehydration -IV fluid as above  AKI: Likely due to prerenal secondary to dehydration - IVF as above - Follow up renal function by BMP - Avoid using renal toxic medications, hypotension and contrast dye -hold HCTZ -US-renal --> unremarkable findings -f/u UA  Recent COVID-19 virus infection: Patient had a positive COVID-19  on 12/18, no fever. Per EMS, pt was noted to have oxygen sat 65% on room air. On arrival pt on face mask at 15L oxygen sat 96%, but then oxygen saturation improved to 94-99% on room air in ED -observe closely now  Elevated troponin: trop 20-->20, minimally elevated in the setting of AKI.  -trend trop -check A1c and FLP -repeat EKG in AM  Lactic acidosis: Lactic acid 3.7 -->3.9. possibly due to dehydration. No fever or leukocytosis, does not seem to have bacterial infection or sepsis. -IV fluid as above -Trend lactic acid level   Inpatient status:  # Patient requires inpatient status due to high intensity of service, high risk for further deterioration and high frequency of surveillance required.  I certify that at the point of admission it is my clinical judgment that the patient will require inpatient hospital care spanning beyond 2 midnights from the point of admission.  . This patient has multiple chronic comorbidities including mental retardation, hypertension, hyperlipidemia, stroke, BPH . Now patient has presenting with hypernatremia, dehydration, hypercalcemia, AKI, elevated troponin, worsening mental status . The worrisome physical exam findings include dry mucus and membrane . The initial radiographic and laboratory data are worrisome because of hypernatremia, hypercalcemia, AKI, elevated troponin . Current medical needs: please see my assessment and plan . Predictability of an adverse outcome (risk): Patient has multiple comorbidities as listed above. Now presents with  hypernatremia, dehydration, hypercalcemia, AKI, elevated troponin, worsening mental status. Patient's presentation is highly complicated.  Patient is at high risk of deteriorating.  Will need to be treated in hospital for at least 2 days.           Code Status: Full code per his niece who is POA Family Communication: Yes, patient's niece by phone  Disposition Plan:  Anticipate discharge back to previous  SNF Consults called:  Renal, Dr. Juleen China Admission status: Med-surg bed as inpt    Date of Service 01/08/2020    [place]   If 7PM-7AM, please contact night-coverage [email or web address] Password [facility] 01/08/2020, 5:15 PM

## 2020-01-08 NOTE — ED Triage Notes (Signed)
Pt arrives via ems from [facility]. EMS reports facility called out for altered mental status. Normally responses response slightly delayed but was not responding to staff this morning,skin cool to touch, oxygen on room air was 65%. On arrival pt on face mask at 15L oxygen sat 96%. Hx cva, covid + 12/18. Responsive and pulls away to pain, eyes open spontaneous and moving face mask off his face. MD present to assess pt at this time

## 2020-01-08 NOTE — ED Notes (Signed)
Lab at bedside to collect blood work 

## 2020-01-08 NOTE — ED Notes (Addendum)
Pt niece garnette Laurance Flatten contacted and given update at this time. She reports that facility told her that pt has not been eating or drinking much over the last 4 weeks. Has not been very verbals since stroke im feb 2020. Niece reported pt was able to say his name and I love you too when speaking to him on phone last Thursday.

## 2020-01-08 NOTE — ED Notes (Signed)
Lab contacted this RN and states that the lactic hemolyzed. Lab informed they will have to collect sample due to difficult blood draw

## 2020-01-08 NOTE — Progress Notes (Signed)
Talked to patient's niece [name] [facility]) to ask admission questions, also gave the unit number if she has any questions and she did set a password for updates.

## 2020-01-08 NOTE — ED Notes (Signed)
Patient transported to Ultrasound 

## 2020-01-08 NOTE — Consult Note (Signed)
[facility]  CONSULT NOTE    Date: 01/08/2020                  Patient Name:  Samuel Shelton  MRN: [id number]  DOB: 09/09/1951  Age / Sex: 69 y.o., male         PCP: Sofie Hartigan, MD                 Service Requesting Consult: Dr. Blaine Hamper                 Reason for Consult: Acute renal failure            History of Present Illness: Samuel Shelton presents from [facility] with altered mental status. Patient is unable to give much history.  He was COVID-19 positive last month.  Found to have concerns for urinary tract infection.  Found to have acute renal failure with hypernatremia so nephrology was consulted.    Medications: Outpatient medications: Medications Prior to Admission  Medication Sig Dispense Refill Last Dose  . acetaminophen (TYLENOL) 500 MG tablet Take 500 mg by mouth every 4 (four) hours as needed for mild pain.   Unknown at PRN  . aspirin 81 MG chewable tablet Chew 1 tablet (81 mg total) by mouth daily.   01/07/2020 at 0800  . atorvastatin (LIPITOR) 40 MG tablet Take 1 tablet (40 mg total) by mouth daily at 6 PM. 30 tablet 2 01/07/2020 at 2000  . finasteride (PROSCAR) 5 MG tablet Take 1 tablet (5 mg total) by mouth daily. 30 tablet 11 01/07/2020 at 0800  . hydrochlorothiazide (HYDRODIURIL) 12.5 MG tablet Take 25 mg by mouth daily.    01/07/2020 at 0800  . Melatonin 3 MG TABS Take 3 mg by mouth at bedtime.   01/07/2020 at 2100  . tamsulosin (FLOMAX) 0.4 MG CAPS capsule Take 1 capsule (0.4 mg total) by mouth daily. 30 capsule 11 01/07/2020 at 0800    Current medications: Current Facility-Administered Medications  Medication Dose Route Frequency Provider Last Rate Last Admin  . albuterol (PROVENTIL) (2.5 MG/3ML) 0.083% nebulizer solution 3 mL  3 mL Inhalation Q4H PRN Ivor Costa, MD      . dextrose 5 % solution   Intravenous Continuous Ivor Costa, MD      . ondansetron [facility]) injection 4 mg  4 mg Intravenous Q8H PRN Ivor Costa, MD       . sodium chloride 0.9 % bolus 500 mL  500 mL Intravenous Once Ivor Costa, MD          Allergies: No Known Allergies    Past Medical History: Past Medical History:  Diagnosis Date  . Chronic mental illness   . Edema of both legs   . Elevated PSA   . Elevated PSA   . Hypertension   . Mental retardation   . Phimosis      Past Surgical History: Past Surgical History:  Procedure Laterality Date  . PROSTATE BIOPSY N/A 06/22/2015   Procedure: PROSTATE BIOPSY;  Surgeon: Hollice Espy, MD;  Location: [facility] ORS;  Service: Urology;  Laterality: N/A;  . PROSTATE BIOPSY N/A 09/27/2016   Procedure: PROSTATE BIOPSY;  Surgeon: Hollice Espy, MD;  Location: [facility] ORS;  Service: Urology;  Laterality: N/A;     Family History: Family History  Problem Relation Age of Onset  . Stroke Mother   . Hypertension Sister   . Diabetes type II Sister      Social History: Social History  Socioeconomic History  . Marital status: Single    Spouse name: Not on file  . Number of children: Not on file  . Years of education: Not on file  . Highest education level: Not on file  Occupational History  . Not on file  Tobacco Use  . Smoking status: Former Smoker    Packs/day: 0.50    Types: Cigarettes  . Smokeless tobacco: Never Used  Substance and Sexual Activity  . Alcohol use: No    Alcohol/week: 0.0 standard drinks  . Drug use: No  . Sexual activity: Not on file  Other Topics Concern  . Not on file  Social History Narrative  . Not on file   Social Determinants of Health   Financial Resource Strain:   . Difficulty of Paying Living Expenses: Not on file  Food Insecurity:   . Worried About Charity fundraiser in the Last Year: Not on file  . Ran Out of Food in the Last Year: Not on file  Transportation Needs:   . Lack of Transportation (Medical): Not on file  . Lack of Transportation (Non-Medical): Not on file  Physical Activity:   . Days of Exercise per Week: Not on file  .  Minutes of Exercise per Session: Not on file  Stress:   . Feeling of Stress : Not on file  Social Connections:   . Frequency of Communication with Friends and Family: Not on file  . Frequency of Social Gatherings with Friends and Family: Not on file  . Attends Religious Services: Not on file  . Active Member of Clubs or Organizations: Not on file  . Attends Archivist Meetings: Not on file  . Marital Status: Not on file  Intimate Partner Violence:   . Fear of Current or Ex-Partner: Not on file  . Emotionally Abused: Not on file  . Physically Abused: Not on file  . Sexually Abused: Not on file     Review of Systems: Review of Systems  Unable to perform ROS: Mental status change    Vital Signs: Blood pressure (!) 124/93, pulse 89, temperature 98.1 F (36.7 C), temperature source Rectal, resp. rate 11, height 6' (1.829 m), weight 85.7 kg, SpO2 99 %.  Weight trends: Filed Weights   01/08/20 0950  Weight: 85.7 kg    Physical Exam: General: cachectic  Head: Dry mucosal membranes  Eyes: Anicteric, PERRL  Neck: Supple, trachea midline  Lungs:  Clear to auscultation  Heart: Regular rate and rhythm  Abdomen:  Soft, nontender,   Extremities:  no peripheral edema.  Neurologic: Not following commands, moving all four extremities.   Skin: No lesions         Lab results: Basic Metabolic Panel: Recent Labs  Lab 01/08/20 1118 01/08/20 1314  NA 170* 170*  K 4.2 4.0  CL 125* 128*  CO2 29 20*  GLUCOSE 151* 139*  BUN 146* 162*  CREATININE 3.15* 3.05*  CALCIUM 10.4* 10.1    Liver Function Tests: Recent Labs  Lab 01/08/20 1118  AST 39  ALT 26  ALKPHOS 67  BILITOT 1.0  PROT 9.3*  ALBUMIN 4.0   No results for input(s): LIPASE, AMYLASE in the last 168 hours. No results for input(s): AMMONIA in the last 168 hours.  CBC: Recent Labs  Lab 01/08/20 1118  WBC 8.6  NEUTROABS 6.2  HGB 19.8*  HCT 68.1*  MCV 95.8  PLT 160    Cardiac Enzymes: No  results for input(s): CKTOTAL, CKMB, CKMBINDEX,  TROPONINI in the last 168 hours.  BNP: Invalid input(s): POCBNP  CBG: No results for input(s): GLUCAP in the last 168 hours.  Microbiology: Results for orders placed or performed in visit on 09/04/19  Microscopic Examination     Status: None   Collection Time: 09/04/19 10:51 AM   URINE  Result Value Ref Range Status   WBC, UA 0-5 0 - 5 /hpf Final   RBC None seen 0 - 2 /hpf Final   Epithelial Cells (non renal) 0-10 0 - 10 /hpf Final   Bacteria, UA None seen None seen/Few Final    Coagulation Studies: No results for input(s): LABPROT, INR in the last 72 hours.  Urinalysis: No results for input(s): COLORURINE, LABSPEC, PHURINE, GLUCOSEU, HGBUR, BILIRUBINUR, KETONESUR, PROTEINUR, UROBILINOGEN, NITRITE, LEUKOCYTESUR in the last 72 hours.  Invalid input(s): APPERANCEUR    Imaging: CT Head Wo Contrast  Result Date: 01/08/2020 CLINICAL DATA:  Altered mental status. EXAM: CT HEAD WITHOUT CONTRAST TECHNIQUE: Contiguous axial images were obtained from the base of the skull through the vertex without intravenous contrast. COMPARISON:  January 28, 2019. January 27, 2019. FINDINGS: Brain: Mild diffuse cortical atrophy is noted. Mild chronic ischemic white matter disease is noted. No mass effect or midline shift is noted. Ventricular size is within normal limits. There is no evidence of mass lesion, hemorrhage or acute infarction. Vascular: No hyperdense vessel or unexpected calcification. Skull: Normal. Negative for fracture or focal lesion. Sinuses/Orbits: No acute finding. Other: None. IMPRESSION: Mild diffuse cortical atrophy. Mild chronic ischemic white matter disease. No acute intracranial abnormality seen. Electronically Signed   By: Marijo Conception M.D.   On: 01/08/2020 10:40   US RENAL  Result Date: 01/08/2020 CLINICAL DATA:  Acute kidney injury EXAM: RENAL / URINARY TRACT ULTRASOUND COMPLETE COMPARISON:  None. FINDINGS: Right Kidney:  Renal measurements: 9.3 x 5 9 x 5.2 cm = volume: 150.4 mL . Echogenicity within normal limits. No mass or hydronephrosis visualized. Left Kidney: Renal measurements: 9.8 x 6.7 x 6.1 cm = volume: 209.2 mL. Echogenicity within normal limits. No mass or hydronephrosis visualized. Bladder: Appears normal for degree of bladder distention. Other: Technically difficult exam due to reported difficulties with patient positioning. IMPRESSION: Unremarkable renal ultrasound. Electronically Signed   By: Lovena Le M.D.   On: 01/08/2020 15:38   DG Chest Portable 1 View  Result Date: 01/08/2020 CLINICAL DATA:  Hypoxia EXAM: PORTABLE CHEST 1 VIEW COMPARISON:  None. FINDINGS: The heart size and mediastinal contours are within normal limits. Tortuosity of the thoracic aorta. Prominent bilateral interstitial lung markings without focal consolidation. No pleural effusion or pneumothorax. The visualized skeletal structures are unremarkable. IMPRESSION: 1. Prominent bilateral interstitial lung markings which could reflect edema versus atypical/viral infection. 2. Tortuosity of the thoracic aorta. Electronically Signed   By: Davina Poke D.O.   On: 01/08/2020 12:31      Assessment & Plan: Mr. Mamoru Herrig is a 69 y.o. black male SNF resident with learning disability, prostate cancer, hypertension and hyperlipidemia, CVA who was admitted to [facility] on 01/08/2020 for Acute kidney failure, unspecified ([place]) [N17.9] Hypernatremia [E87.0] Altered mental status, unspecified altered mental status type [R41.82]  1. Acute renal failure: baseline creatinine of 0.95 with GFR >60 on 01/31/2019  2. Hypernatremia: free water deficit 9.4 liters  3. Metabolic acidosis  4. Hypercalcemia  History and presentation are suggestive of dehydration and prerenal azotemia but due to history of prostate cancer and hypertrophy, will schedule a renal ultrasound.  - Start dextrose infusion -  Not a candidate for dailysis   LOS: 0 Karlita Lichtman 1/13/20213:58 PM

## 2020-01-08 NOTE — ED Notes (Signed)
Patient transported to CT 

## 2020-01-08 NOTE — ED Notes (Signed)
IV team consult unable to obtain blood work. Pt has access but unable to draw labs. Lab contacted to collect blood work ordered at this time

## 2020-01-09 LAB — BASIC METABOLIC PANEL
Anion gap: UNDETERMINED (ref 5–15)
Anion gap: UNDETERMINED (ref 5–15)
Anion gap: 15 (ref 5–15)
Anion gap: 13 (ref 5–15)
BUN: 157 mg/dL — ABNORMAL HIGH (ref 8–23)
BUN: 145 mg/dL — ABNORMAL HIGH (ref 8–23)
BUN: 150 mg/dL — ABNORMAL HIGH (ref 8–23)
BUN: 144 mg/dL — ABNORMAL HIGH (ref 8–23)
CO2: 25 mmol/L (ref 22–32)
CO2: 26 mmol/L (ref 22–32)
CO2: 24 mmol/L (ref 22–32)
CO2: 27 mmol/L (ref 22–32)
Calcium: 9.8 mg/dL (ref 8.9–10.3)
Calcium: 9.6 mg/dL (ref 8.9–10.3)
Calcium: 9.6 mg/dL (ref 8.9–10.3)
Calcium: 9.6 mg/dL (ref 8.9–10.3)
Chloride: >130 mmol/L (ref 98–111)
Chloride: >130 mmol/L (ref 98–111)
Chloride: 126 mmol/L — ABNORMAL HIGH (ref 98–111)
Chloride: 124 mmol/L — ABNORMAL HIGH (ref 98–111)
Creatinine, Ser: 2.83 mg/dL — ABNORMAL HIGH (ref 0.61–1.24)
Creatinine, Ser: 2.84 mg/dL — ABNORMAL HIGH (ref 0.61–1.24)
Creatinine, Ser: 2.96 mg/dL — ABNORMAL HIGH (ref 0.61–1.24)
Creatinine, Ser: 3.09 mg/dL — ABNORMAL HIGH (ref 0.61–1.24)
GFR calc Af Amer: 25 mL/min — ABNORMAL LOW (ref >60)
GFR calc Af Amer: 25 mL/min — ABNORMAL LOW (ref >60)
GFR calc Af Amer: 24 mL/min — ABNORMAL LOW (ref >60)
GFR calc Af Amer: 22 mL/min — ABNORMAL LOW (ref >60)
GFR calc non Af Amer: 22 mL/min — ABNORMAL LOW (ref >60)
GFR calc non Af Amer: 21 mL/min — ABNORMAL LOW (ref >60)
GFR calc non Af Amer: 20 mL/min — ABNORMAL LOW (ref >60)
GFR calc non Af Amer: 19 mL/min — ABNORMAL LOW (ref >60)
Glucose, Bld: 130 mg/dL — ABNORMAL HIGH (ref 70–99)
Glucose, Bld: 150 mg/dL — ABNORMAL HIGH (ref 70–99)
Glucose, Bld: 160 mg/dL — ABNORMAL HIGH (ref 70–99)
Glucose, Bld: 155 mg/dL — ABNORMAL HIGH (ref 70–99)
Potassium: 4.4 mmol/L (ref 3.5–5.1)
Potassium: 4 mmol/L (ref 3.5–5.1)
Potassium: 3.9 mmol/L (ref 3.5–5.1)
Potassium: 4.6 mmol/L (ref 3.5–5.1)
Sodium: 171 mmol/L (ref 135–145)
Sodium: 170 mmol/L (ref 135–145)
Sodium: 165 mmol/L (ref 135–145)
Sodium: 164 mmol/L (ref 135–145)

## 2020-01-09 LAB — CBC
HCT: 62.8 % — ABNORMAL HIGH (ref 39.0–52.0)
Hemoglobin: 18.5 g/dL — ABNORMAL HIGH (ref 13.0–17.0)
MCH: 27.9 pg (ref 26.0–34.0)
MCHC: 29.5 g/dL — ABNORMAL LOW (ref 30.0–36.0)
MCV: 94.7 fL (ref 80.0–100.0)
Platelets: 165 10*3/uL (ref 150–400)
RBC: 6.63 MIL/uL — ABNORMAL HIGH (ref 4.22–5.81)
RDW: 16.2 % — ABNORMAL HIGH (ref 11.5–15.5)
WBC: 8.4 10*3/uL (ref 4.0–10.5)
nRBC: 0.2 % (ref 0.0–0.2)

## 2020-01-09 LAB — HEMOGLOBIN A1C
Hgb A1c MFr Bld: 6.7 % — ABNORMAL HIGH (ref 4.8–5.6)
Mean Plasma Glucose: 145.59 mg/dL

## 2020-01-09 LAB — LIPID PANEL
Cholesterol: 110 mg/dL (ref 0–200)
HDL: 32 mg/dL — ABNORMAL LOW (ref >40)
LDL Cholesterol: 54 mg/dL (ref 0–99)
Total CHOL/HDL Ratio: 3.4 RATIO
Triglycerides: 121 mg/dL (ref <150)
VLDL: 24 mg/dL (ref 0–40)

## 2020-01-09 LAB — TROPONIN I (HIGH SENSITIVITY): Troponin I (High Sensitivity): 21 ng/L — ABNORMAL HIGH (ref <18)

## 2020-01-09 LAB — OSMOLALITY: Osmolality: 417 mOsm/kg (ref 275–295)

## 2020-01-09 LAB — LACTIC ACID, PLASMA: Lactic Acid, Venous: 3.7 mmol/L (ref 0.5–1.9)

## 2020-01-09 LAB — GLUCOSE, CAPILLARY: Glucose-Capillary: 142 mg/dL — ABNORMAL HIGH (ref 70–99)

## 2020-01-09 MED ORDER — ORAL CARE MOUTH RINSE
15.0000 mL | Freq: Two times a day (BID) | OROMUCOSAL | Status: DC
  Administered 2020-01-09 – 2020-01-10 (×3): 15 mL via OROMUCOSAL

## 2020-01-09 MED ORDER — ALBUTEROL SULFATE HFA 108 (90 BASE) MCG/ACT IN AERS
2.0000 | INHALATION_SPRAY | RESPIRATORY_TRACT | Status: DC | PRN
  Filled 2020-01-09: qty 6.7

## 2020-01-09 NOTE — Progress Notes (Signed)
Dr. Posey Pronto and Dr. Juleen China aware of recent abnormal labs including, Na, BUN/Cr

## 2020-01-09 NOTE — Progress Notes (Signed)
[facility] [place] NAME: Samuel Shelton    MR#:  [id number]  DATE OF BIRTH:  1951-07-26  SUBJECTIVE:  patient is a resident [facility] since February last year after he had a stroke. Patient is unable to give any history review of systems. He is alert but noncommunicative. spoke with niece Ms. Donna Christen.   REVIEW OF SYSTEMS:   Review of Systems  Unable to perform ROS: Mental status change   Tolerating Diet: Tolerating PT:   DRUG ALLERGIES:  No Known Allergies  VITALS:  Blood pressure (!) 117/91, pulse 86, temperature 97.6 F (36.4 C), temperature source Oral, resp. rate 19, height 6' (1.829 m), weight 85.7 kg, SpO2 100 %.  PHYSICAL EXAMINATION:   Physical Exam-- limited exam  GENERAL:  69 y.o.-year-old patient lying in the bed with no acute distress.  Appears severely dehydrated emaciated EYES: Pupils equal, round, reactive to light and accommodation. No scleral icterus.  HEENT: Head atraumatic, normocephalic. Oropharynx and nasopharynx clear.  LUNGS: Normal breath sounds bilaterally, no wheezing, rales, rhonchi. No use of accessory muscles of respiration.  CARDIOVASCULAR: S1, S2 normal. No murmurs, rubs, or gallops.  ABDOMEN: Soft, nontender, nondistended. Few bowel sounds present EXTREMITIES: No cyanosis, clubbing or edema b/l.    NEUROLOGIC: Cranial nerves II through XII are intact. No focal Motor or sensory deficits b/l.   PSYCHIATRIC:  patient is awake but does not respond to or verbalize  SKIN: No obvious rash, lesion, or ulcer-- per RN   LABORATORY PANEL:  CBC Recent Labs  Lab 01/09/20 0139  WBC 8.4  HGB 18.5*  HCT 62.8*  PLT 165    Chemistries  Recent Labs  Lab 01/08/20 1118 01/08/20 1314 01/09/20 0139  NA 170*   < > 170*  K 4.2   < > 4.0  CL 125*   < > >130*  CO2 29   < > 26  GLUCOSE 151*   < > 150*  BUN 146*   < > 145*  CREATININE 3.15*   < > 2.84*  CALCIUM 10.4*   < > 9.6  AST 39  --   --    ALT 26  --   --   ALKPHOS 67  --   --   BILITOT 1.0  --   --    < > = values in this interval not displayed.   Cardiac Enzymes No results for input(s): TROPONINI in the last 168 hours. RADIOLOGY:  CT Head Wo Contrast  Result Date: 01/08/2020 CLINICAL DATA:  Altered mental status. EXAM: CT HEAD WITHOUT CONTRAST TECHNIQUE: Contiguous axial images were obtained from the base of the skull through the vertex without intravenous contrast. COMPARISON:  January 28, 2019. January 27, 2019. FINDINGS: Brain: Mild diffuse cortical atrophy is noted. Mild chronic ischemic white matter disease is noted. No mass effect or midline shift is noted. Ventricular size is within normal limits. There is no evidence of mass lesion, hemorrhage or acute infarction. Vascular: No hyperdense vessel or unexpected calcification. Skull: Normal. Negative for fracture or focal lesion. Sinuses/Orbits: No acute finding. Other: None. IMPRESSION: Mild diffuse cortical atrophy. Mild chronic ischemic white matter disease. No acute intracranial abnormality seen. Electronically Signed   By: Marijo Conception M.D.   On: 01/08/2020 10:40   US RENAL  Result Date: 01/08/2020 CLINICAL DATA:  Acute kidney injury EXAM: RENAL / URINARY TRACT ULTRASOUND COMPLETE COMPARISON:  None. FINDINGS: Right Kidney: Renal measurements: 9.3 x 5 9 x  5.2 cm = volume: 150.4 mL . Echogenicity within normal limits. No mass or hydronephrosis visualized. Left Kidney: Renal measurements: 9.8 x 6.7 x 6.1 cm = volume: 209.2 mL. Echogenicity within normal limits. No mass or hydronephrosis visualized. Bladder: Appears normal for degree of bladder distention. Other: Technically difficult exam due to reported difficulties with patient positioning. IMPRESSION: Unremarkable renal ultrasound. Electronically Signed   By: Lovena Le M.D.   On: 01/08/2020 15:38   DG Chest Portable 1 View  Result Date: 01/08/2020 CLINICAL DATA:  Hypoxia EXAM: PORTABLE CHEST 1 VIEW COMPARISON:   None. FINDINGS: The heart size and mediastinal contours are within normal limits. Tortuosity of the thoracic aorta. Prominent bilateral interstitial lung markings without focal consolidation. No pleural effusion or pneumothorax. The visualized skeletal structures are unremarkable. IMPRESSION: 1. Prominent bilateral interstitial lung markings which could reflect edema versus atypical/viral infection. 2. Tortuosity of the thoracic aorta. Electronically Signed   By: Davina Poke D.O.   On: 01/08/2020 12:31   ASSESSMENT AND PLAN:  [name] is a 69 y.o. male with medical history significant of mental retardation, hypertension, hyperlipidemia, stroke, BPH, who presents with altered mental status.  Severe Hypernatremia: Na 170.  Not very sure how acute it is.  -Free water deficit is 9.3 L per Dr. Juleen China. - Patient received 1.0 L of normal saline in emergency room.  -Renal, Dr. Juleen China was consulted--cont D5W at 130cc/h -ultrasound essentially nothing acute -check BMP q6h -check urine and plasma Osmo and urine sodium level - Serum Sodium 170--170--171  severe dehydration due to poor PO intake -on IVF as above -hold home HCTZ  Chronic mental disorder and acute metabolic encephalopathy, uremic -CT head is negative for acute intracranial abnormalities, likely due to electrolytes disturbance and worsening renal function -hold all oral meds and keep pt NPO until mental status improves  HTN: -Hold oral medications -IV hydralazine as needed  Hx of Stroke (cerebrum) ([place]): -hold ASA and lipitor now  Hyperlipidemia: -hold lipitor now  BPH (benign prostatic hyperplasia): -hold home Proscar and Flomax  Hypercalcemia: Ca 10.4, likely due to dehydration -IV fluids as above  AKI: Likely due to prerenal secondary to dehydration - IVF as above - Follow up renal function by BMP - Avoid using renal toxic medications, hypotension and contrast dye -hold HCTZ -US-renal -->  unremarkable findings  Recent COVID-19 virus infection: Patient had a positive COVID-19 on 12/18, no fever.  -patient not in any respiratory distress. Sats are stable. -According to infection control okay to discontinue isolation  Lactic acidosis: Lactic acid 3.7 -->3.9. possibly due to dehydration. No fever or leukocytosis, does not seem to have bacterial infection or sepsis. -IV fluid as above -Trend lactic acid level  spoke with patient's niece Ms. Donna Christen who told me patient has been at [facility] since last year after his stroke. He is able to walk some feeds himself and was doing well till Thanksgiving which was the last time niece was able to visit her uncle. She was very shocked to hear about her uncle's condition.  I did readdress code status with her and she wants her uncle to be full code for now he does understand patient's prognosis is  poor right now.  Procedures: none Family communication : spoke with niece Ms. Donna Christen with patient's [facility] POA Consults : nephrology, palliative care  discharge Disposition : TBD CODE STATUS: Full code DVT Prophylaxis :heparin  TOTAL TIME TAKING CARE OF THIS PATIENT: *35* minutes.  >50% time spent on counselling and coordination  of care  Note: This dictation was prepared with Dragon dictation along with smaller phrase technology. Any transcriptional errors that result from this process are unintentional.  Fritzi Mandes M.D on 01/09/2020 at 1:47 PM  Between 7am to 6pm - Pager - [phone]  After 6pm go to [email or web address]  [facility]   CC: Primary care physician; Sofie Hartigan, MDPatient ID: [name], male   DOB: 03-09-1951, 69 y.o.   MRN: [id number]

## 2020-01-09 NOTE — Progress Notes (Signed)
D5W rate adjusted based on current sodium and weight to decrease sodium by 0.5 mmol/L/HR

## 2020-01-09 NOTE — Evaluation (Addendum)
Clinical/Bedside Swallow Evaluation Patient Details  Name: Samuel Shelton MRN: [id number] Date of Birth: 1951-04-29  Today's Date: 01/09/2020 Time: SLP Start Time (ACUTE ONLY): 49 SLP Stop Time (ACUTE ONLY): 1135 SLP Time Calculation (min) (ACUTE ONLY): 55 min  Past Medical History:  Past Medical History:  Diagnosis Date  . Chronic mental illness   . Edema of both legs   . Elevated PSA   . Elevated PSA   . Hypertension   . Mental retardation   . Phimosis    Past Surgical History:  Past Surgical History:  Procedure Laterality Date  . PROSTATE BIOPSY N/A 06/22/2015   Procedure: PROSTATE BIOPSY;  Surgeon: Hollice Espy, MD;  Location: [facility] ORS;  Service: Urology;  Laterality: N/A;  . PROSTATE BIOPSY N/A 09/27/2016   Procedure: PROSTATE BIOPSY;  Surgeon: Hollice Espy, MD;  Location: [facility] ORS;  Service: Urology;  Laterality: N/A;   HPI:  Samuel Shelton is a 69 y.o. male with medical history significant of mental retardation, hypertension, hyperlipidemia, stroke, BPH, who presents with altered mental status. Per report, Samuel Shelton was noted to have worsening mental status recently and has decreased oral intake and decreased responsiveness than baseline. Samuel Shelton opens his eyes spontaneously, and was moving face mask off his face. Samuel Shelton had positive Covid 19 test on 12/13/19. Per EMS, Samuel Shelton was noted to have oxygen sat 65% on room air. On arrival Samuel Shelton on face mask at 15L oxygen sat 96%, but then oxygen saturation improved to 94-99% on room air in ED. Samuel Shelton admitted w/ Dehydration and Hypernatremia: Na 170.  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  CXR: "Prominent bilateral interstitial lung markings which could reflect edema versus atypical/viral infection"; Head CT: "Mild diffuse cortical atrophy. Mild chronic ischemic white matter disease. No acute intracranial abnormality seen".    Assessment / Plan /  Recommendation Clinical Impression  Samuel Shelton appears to present w/ oropharyngeal phase dysphagia significantly impacted by his declined Cognitive/Mental status resulting in increased risk for choking/aspiration w/ any oral intake currently. Samuel Shelton does have a baseline of [name] exhibited significant oral phase deficits c/b declined attention to task and reduced oral awareness of stimulation of bolus material placed anteriorly/orally in mouth. Samuel Shelton did not demonstated any lingual or oral movements for bolus management, swallowing. Oral holding noted despite max. verbal/tactile stimulation around mouth by SLP. Bolus material of Nectar liquids and puree was removed from the oral cavity; residue from ice chip trials was absorbed. No pharyngeal swallow was appreciated during this assessment. No further trials attempted d/t Samuel Shelton's presentation and increased risk for aspiration. Oral Care and OM exam attempted w/ dry mucosa and dried skin on tongue; Samuel Shelton often bit on the sponge swabs preventing SLP's use of swabs.  Recommend continue NPO status at this time w/ frequent oral care for hygiene and stimulation of swallowing; aspiration precautions. ST services will f/u next 1-2 days w/ ongoing assessment of Samuel Shelton's swallow function and safety w/ oral intake for initiation of oral diet. MD/NSG updated.  SLP Visit Diagnosis: Dysphagia, oropharyngeal phase (R13.12)(baseline MR)    Aspiration Risk  Moderate aspiration risk;Risk for inadequate nutrition/hydration    Diet Recommendation  NPO w/ frequent oral care by [facility] staff for hygiene and stimulation of swallowing; aspiration precautions  Medication Administration: Via alternative means    Other  Recommendations Recommended Consults: (Dietician f/u) Oral Care Recommendations: Oral care QID;Staff/trained caregiver to provide oral care Other Recommendations: (  TBD)   Follow up Recommendations Skilled Nursing facility(TBD)      Frequency and Duration min 3x week  2 weeks        Prognosis Prognosis for Safe Diet Advancement: Guarded Barriers to Reach Goals: Cognitive deficits;Time post onset;Severity of deficits;Behavior      Swallow Study   General Date of Onset: 01/08/20 HPI: Samuel Shelton is a 69 y.o. male with medical history significant of mental retardation, hypertension, hyperlipidemia, stroke, BPH, who presents with altered mental status. Per report, Samuel Shelton was noted to have worsening mental status recently and has decreased oral intake and decreased responsiveness than baseline. Samuel Shelton opens his eyes spontaneously, and was moving face mask off his face. Samuel Shelton had positive Covid 19 test on 12/13/19. Per EMS, Samuel Shelton was noted to have oxygen sat 65% on room air. On arrival Samuel Shelton on face mask at 15L oxygen sat 96%, but then oxygen saturation improved to 94-99% on room air in ED. Samuel Shelton admitted w/ Dehydration and Hypernatremia: Na 170.  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  CXR: "Prominent bilateral interstitial lung markings which could reflect edema versus atypical/viral infection"; Head CT: "Mild diffuse cortical atrophy. Mild chronic ischemic white matter disease. No acute intracranial abnormality seen".  Type of Study: Bedside Swallow Evaluation Previous Swallow Assessment: 01/2019 - cva then Diet Prior to this Study: Dysphagia 2 (chopped);Thin liquids Temperature Spikes Noted: No(wbc 8.4) Respiratory Status: Room air History of Recent Intubation: No Behavior/Cognition: Cooperative;Confused;Requires cueing;Doesn't follow directions(Awake) Oral Cavity Assessment: Dry;Dried secretions(on tongue) Oral Care Completed by SLP: Yes(attempted w/ Samuel Shelton's participation) Oral Cavity - Dentition: Adequate natural dentition;Missing dentition Vision: (n/a) Self-Feeding Abilities: Total assist Patient Positioning: Upright in bed(needed full support for sitting upright) Baseline Vocal Quality:  (nonverbal) Volitional Cough: Cognitively unable to elicit Volitional Swallow: Unable to elicit    Oral/Motor/Sensory Function Overall Oral Motor/Sensory Function: Generalized oral weakness(open mouth posture)   Ice Chips Ice chips: Impaired Presentation: Spoon(fed; 2 trials) Oral Phase Impairments: Poor awareness of bolus;Reduced lingual movement/coordination Oral Phase Functional Implications: Oral holding Pharyngeal Phase Impairments: (no overt s/s of aspiration)   Thin Liquid Thin Liquid: Not tested    Nectar Thick Nectar Thick Liquid: Impaired Presentation: Spoon(fed; 2 trials) Oral Phase Impairments: Reduced labial seal;Reduced lingual movement/coordination;Poor awareness of bolus Oral phase functional implications: Oral holding;Oral residue(bolus material removed)   Honey Thick Honey Thick Liquid: Not tested   Puree Puree: Impaired Presentation: Spoon(fed; 2 trials) Oral Phase Impairments: Reduced labial seal;Reduced lingual movement/coordination;Poor awareness of bolus Oral Phase Functional Implications: Oral residue;Oral holding(bolus material removed)   Solid     Solid: Not tested       Orinda Kenner, MS, CCC-SLP Jozie Wulf 01/09/2020,2:32 PM

## 2020-01-09 NOTE — Progress Notes (Signed)
Per Dr. Posey Pronto and Valeta Harms, Infection Prevention: patient can come off of precautions because it has been greater than 21 days since his positive result.

## 2020-01-09 NOTE — Progress Notes (Signed)
[facility]  ROUNDING NOTE   Subjective:     Objective:  Vital signs in last 24 hours:  Temp:  [98.4 F (36.9 C)] 98.4 F (36.9 C) (01/14 1700) Pulse Rate:  [75-86] 84 (01/14 1700) Resp:  [18-20] 18 (01/14 1700) BP: (93-139)/(73-94) 93/73 (01/14 1700) SpO2:  [98 %-100 %] 98 % (01/14 1700)  Weight change:  Filed Weights   01/08/20 0950  Weight: 85.7 kg    Intake/Output: I/O last 3 completed shifts: In: 2215.9 [I.V.:1215.9; IV Piggyback:1000] Out: -    Intake/Output this shift:  Total I/O In: 1136.2 [I.V.:1136.2] Out: -   Physical Exam: General: NAD, cachectic  Head: Normocephalic, atraumatic. Moist oral mucosal membranes  Eyes: Anicteric, PERRL  Neck: Supple, trachea midline  Lungs:  Clear to auscultation  Heart: Regular rate and rhythm  Abdomen:  Soft, nontender,   Extremities:  no peripheral edema.  Neurologic: Not following commands  Skin: No lesions        Basic Metabolic Panel: Recent Labs  Lab 01/08/20 1118 01/08/20 1118 01/08/20 1314 01/08/20 1314 01/08/20 2249 01/09/20 0139 01/09/20 1320  NA 170*  --  170*  --  171* 170* 165*  K 4.2  --  4.0  --  4.4 4.0 3.9  CL 125*  --  128*  --  >130* >130* 126*  CO2 29  --  20*  --  25 26 24   GLUCOSE 151*  --  139*  --  130* 150* 160*  BUN 146*  --  162*  --  157* 145* 150*  CREATININE 3.15*  --  3.05*  --  2.83* 2.84* 2.96*  CALCIUM 10.4*   < > 10.1   < > 9.8 9.6 9.6   < > = values in this interval not displayed.    Liver Function Tests: Recent Labs  Lab 01/08/20 1118  AST 39  ALT 26  ALKPHOS 67  BILITOT 1.0  PROT 9.3*  ALBUMIN 4.0   No results for input(s): LIPASE, AMYLASE in the last 168 hours. No results for input(s): AMMONIA in the last 168 hours.  CBC: Recent Labs  Lab 01/08/20 1118 01/09/20 0139  WBC 8.6 8.4  NEUTROABS 6.2  --   HGB 19.8* 18.5*  HCT 68.1* 62.8*  MCV 95.8 94.7  PLT 160 165    Cardiac Enzymes: No results for input(s): CKTOTAL, CKMB,  CKMBINDEX, TROPONINI in the last 168 hours.  BNP: Invalid input(s): POCBNP  CBG: Recent Labs  Lab 01/09/20 0856  GLUCAP 142*    Microbiology: Results for orders placed or performed during the hospital encounter of 01/08/20  MRSA PCR Screening     Status: None   Collection Time: 01/08/20  5:10 PM   Specimen: Urine, Clean Catch; Nasopharyngeal  Result Value Ref Range Status   MRSA by PCR NEGATIVE NEGATIVE Final    Comment:        The GeneXpert MRSA Assay (FDA approved for NASAL specimens only), is one component of a comprehensive MRSA colonization surveillance program. It is not intended to diagnose MRSA infection nor to guide or monitor treatment for MRSA infections. Performed at [facility], [address] [place], [place] [address]     Coagulation Studies: No results for input(s): LABPROT, INR in the last 72 hours.  Urinalysis: Recent Labs    01/08/20 1739  COLORURINE YELLOW*  LABSPEC 1.020  PHURINE 5.0  GLUCOSEU NEGATIVE  HGBUR NEGATIVE  BILIRUBINUR NEGATIVE  KETONESUR NEGATIVE  PROTEINUR NEGATIVE  NITRITE NEGATIVE  LEUKOCYTESUR NEGATIVE      Imaging: CT Head Wo Contrast  Result Date: 01/08/2020 CLINICAL DATA:  Altered mental status. EXAM: CT HEAD WITHOUT CONTRAST TECHNIQUE: Contiguous axial images were obtained from the base of the skull through the vertex without intravenous contrast. COMPARISON:  January 28, 2019. January 27, 2019. FINDINGS: Brain: Mild diffuse cortical atrophy is noted. Mild chronic ischemic white matter disease is noted. No mass effect or midline shift is noted. Ventricular size is within normal limits. There is no evidence of mass lesion, hemorrhage or acute infarction. Vascular: No hyperdense vessel or unexpected calcification. Skull: Normal. Negative for fracture or focal lesion. Sinuses/Orbits: No acute finding. Other: None. IMPRESSION: Mild diffuse cortical atrophy. Mild chronic ischemic white matter disease. No acute  intracranial abnormality seen. Electronically Signed   By: Marijo Conception M.D.   On: 01/08/2020 10:40   US RENAL  Result Date: 01/08/2020 CLINICAL DATA:  Acute kidney injury EXAM: RENAL / URINARY TRACT ULTRASOUND COMPLETE COMPARISON:  None. FINDINGS: Right Kidney: Renal measurements: 9.3 x 5 9 x 5.2 cm = volume: 150.4 mL . Echogenicity within normal limits. No mass or hydronephrosis visualized. Left Kidney: Renal measurements: 9.8 x 6.7 x 6.1 cm = volume: 209.2 mL. Echogenicity within normal limits. No mass or hydronephrosis visualized. Bladder: Appears normal for degree of bladder distention. Other: Technically difficult exam due to reported difficulties with patient positioning. IMPRESSION: Unremarkable renal ultrasound. Electronically Signed   By: Lovena Le M.D.   On: 01/08/2020 15:38   DG Chest Portable 1 View  Result Date: 01/08/2020 CLINICAL DATA:  Hypoxia EXAM: PORTABLE CHEST 1 VIEW COMPARISON:  None. FINDINGS: The heart size and mediastinal contours are within normal limits. Tortuosity of the thoracic aorta. Prominent bilateral interstitial lung markings without focal consolidation. No pleural effusion or pneumothorax. The visualized skeletal structures are unremarkable. IMPRESSION: 1. Prominent bilateral interstitial lung markings which could reflect edema versus atypical/viral infection. 2. Tortuosity of the thoracic aorta. Electronically Signed   By: Davina Poke D.O.   On: 01/08/2020 12:31     Medications:   . dextrose 130 mL/hr at 01/09/20 1338   . heparin  5,000 Units Subcutaneous Q8H  . influenza vaccine adjuvanted  0.5 mL Intramuscular Tomorrow-1000  . mouth rinse  15 mL Mouth Rinse BID  . pneumococcal 23 valent vaccine  0.5 mL Intramuscular Tomorrow-1000   acetaminophen **OR** acetaminophen, albuterol, hydrALAZINE, ondansetron (ZOFRAN) IV  Assessment/ Plan:  [name] is a 69 y.o. black male SNF resident with learning disability, prostate cancer,  hypertension and hyperlipidemia, CVA who was admitted to [facility] on 01/08/2020 for Acute kidney failure, unspecified ([place]) [N17.9] Hypernatremia [E87.0] Altered mental status, unspecified altered mental status type [R41.82]  1. Acute renal failure: baseline creatinine of 0.95 with GFR >60 on 01/31/2019  2. Hypernatremia: free water deficit improved to 7.7 liters  3. Metabolic acidosis  4. Hypercalcemia  History and presentation are suggestive of dehydration and prerenal azotemia. Ultrasound with no obstruction - Continue dextrose infusion at 143mL/hr - Not a candidate for dailysis   LOS: 1 Samuel Shelton 1/14/20216:03 PM

## 2020-01-10 DIAGNOSIS — U071 COVID-19: Secondary | ICD-10-CM

## 2020-01-10 DIAGNOSIS — I6300 Cerebral infarction due to thrombosis of unspecified precerebral artery: Secondary | ICD-10-CM

## 2020-01-10 DIAGNOSIS — Z7189 Other specified counseling: Secondary | ICD-10-CM

## 2020-01-10 DIAGNOSIS — Z515 Encounter for palliative care: Secondary | ICD-10-CM

## 2020-01-10 LAB — BASIC METABOLIC PANEL
Anion gap: 16 — ABNORMAL HIGH (ref 5–15)
Anion gap: 19 — ABNORMAL HIGH (ref 5–15)
BUN: 156 mg/dL — ABNORMAL HIGH (ref 8–23)
BUN: 163 mg/dL — ABNORMAL HIGH (ref 8–23)
CO2: 21 mmol/L — ABNORMAL LOW (ref 22–32)
CO2: 19 mmol/L — ABNORMAL LOW (ref 22–32)
Calcium: 9.2 mg/dL (ref 8.9–10.3)
Calcium: 8.8 mg/dL — ABNORMAL LOW (ref 8.9–10.3)
Chloride: 122 mmol/L — ABNORMAL HIGH (ref 98–111)
Chloride: 116 mmol/L — ABNORMAL HIGH (ref 98–111)
Creatinine, Ser: 3.64 mg/dL — ABNORMAL HIGH (ref 0.61–1.24)
Creatinine, Ser: 6.4 mg/dL — ABNORMAL HIGH (ref 0.61–1.24)
GFR calc Af Amer: 18 mL/min — ABNORMAL LOW (ref >60)
GFR calc Af Amer: 9 mL/min — ABNORMAL LOW (ref >60)
GFR calc non Af Amer: 16 mL/min — ABNORMAL LOW (ref >60)
GFR calc non Af Amer: 8 mL/min — ABNORMAL LOW (ref >60)
Glucose, Bld: 170 mg/dL — ABNORMAL HIGH (ref 70–99)
Glucose, Bld: 202 mg/dL — ABNORMAL HIGH (ref 70–99)
Potassium: 3.8 mmol/L (ref 3.5–5.1)
Potassium: 4.2 mmol/L (ref 3.5–5.1)
Sodium: 159 mmol/L — ABNORMAL HIGH (ref 135–145)
Sodium: 154 mmol/L — ABNORMAL HIGH (ref 135–145)

## 2020-01-10 LAB — GLUCOSE, CAPILLARY
Glucose-Capillary: 119 mg/dL — ABNORMAL HIGH (ref 70–99)
Glucose-Capillary: 200 mg/dL — ABNORMAL HIGH (ref 70–99)

## 2020-01-10 LAB — MAGNESIUM: Magnesium: 4.4 mg/dL — ABNORMAL HIGH (ref 1.7–2.4)

## 2020-01-10 LAB — UREA NITROGEN, URINE: Urea Nitrogen, Ur: 1376 mg/dL

## 2020-01-10 NOTE — Care Management Important Message (Signed)
Important Message  Patient Details  Name: Samuel Shelton MRN: [id number] Date of Birth: May 11, 1951   Medicare Important Message Given:  Yes     Dannette Barbara 01/10/2020, 11:23 AM

## 2020-01-10 NOTE — Progress Notes (Signed)
[facility]  ROUNDING NOTE   Subjective:   Na 159 (164)  D5W at 139mL/hr  Creatinine 3.64 (3.09)  Patient not taking PO  Objective:  Vital signs in last 24 hours:  Temp:  [97.7 F (36.5 C)-98.4 F (36.9 C)] 98 F (36.7 C) (01/15 0836) Pulse Rate:  [84-97] 97 (01/15 0836) Resp:  [18-20] 20 (01/15 0836) BP: (93-116)/(61-73) 95/66 (01/15 0836) SpO2:  [97 %-100 %] 99 % (01/15 0836)  Weight change:  Filed Weights   01/08/20 0950  Weight: 85.7 kg    Intake/Output: I/O last 3 completed shifts: In: 3052 [I.V.:3052] Out: 120 [Urine:120]   Intake/Output this shift:  No intake/output data recorded.  Physical Exam: General: Labored breathing, cachectic  Head: Normocephalic, atraumatic. Moist oral mucosal membranes  Eyes: Anicteric, PERRL  Neck: Supple, trachea midline  Lungs:  Bilateral crackles, diminished bilaterally  Heart: Regular rate and rhythm  Abdomen:  Soft, nontender,   Extremities:  no peripheral edema.  Neurologic: Not following commands  Skin: No lesions        Basic Metabolic Panel: Recent Labs  Lab 01/08/20 2249 01/08/20 2249 01/09/20 0139 01/09/20 0139 01/09/20 1320 01/09/20 1840 01/10/20 0048  NA 171*  --  170*  --  165* 164* 159*  K 4.4  --  4.0  --  3.9 4.6 3.8  CL >130*  --  >130*  --  126* 124* 122*  CO2 25  --  26  --  24 27 21*  GLUCOSE 130*  --  150*  --  160* 155* 170*  BUN 157*  --  145*  --  150* 144* 156*  CREATININE 2.83*  --  2.84*  --  2.96* 3.09* 3.64*  CALCIUM 9.8   < > 9.6   < > 9.6 9.6 9.2   < > = values in this interval not displayed.    Liver Function Tests: Recent Labs  Lab 01/08/20 1118  AST 39  ALT 26  ALKPHOS 67  BILITOT 1.0  PROT 9.3*  ALBUMIN 4.0   No results for input(s): LIPASE, AMYLASE in the last 168 hours. No results for input(s): AMMONIA in the last 168 hours.  CBC: Recent Labs  Lab 01/08/20 1118 01/09/20 0139  WBC 8.6 8.4  NEUTROABS 6.2  --   HGB 19.8* 18.5*  HCT 68.1*  62.8*  MCV 95.8 94.7  PLT 160 165    Cardiac Enzymes: No results for input(s): CKTOTAL, CKMB, CKMBINDEX, TROPONINI in the last 168 hours.  BNP: Invalid input(s): POCBNP  CBG: Recent Labs  Lab 01/09/20 0856 01/10/20 0835  GLUCAP 142* 119*    Microbiology: Results for orders placed or performed during the hospital encounter of 01/08/20  MRSA PCR Screening     Status: None   Collection Time: 01/08/20  5:10 PM   Specimen: Urine, Clean Catch; Nasopharyngeal  Result Value Ref Range Status   MRSA by PCR NEGATIVE NEGATIVE Final    Comment:        The GeneXpert MRSA Assay (FDA approved for NASAL specimens only), is one component of a comprehensive MRSA colonization surveillance program. It is not intended to diagnose MRSA infection nor to guide or monitor treatment for MRSA infections. Performed at [facility], [address] [place], [place] [address]     Coagulation Studies: No results for input(s): LABPROT, INR in the last 72 hours.  Urinalysis: Recent Labs    01/08/20 1739  COLORURINE YELLOW*  LABSPEC 1.020  PHURINE 5.0  GLUCOSEU  NEGATIVE  HGBUR NEGATIVE  BILIRUBINUR NEGATIVE  KETONESUR NEGATIVE  PROTEINUR NEGATIVE  NITRITE NEGATIVE  LEUKOCYTESUR NEGATIVE      Imaging: CT Head Wo Contrast  Result Date: 01/08/2020 CLINICAL DATA:  Altered mental status. EXAM: CT HEAD WITHOUT CONTRAST TECHNIQUE: Contiguous axial images were obtained from the base of the skull through the vertex without intravenous contrast. COMPARISON:  January 28, 2019. January 27, 2019. FINDINGS: Brain: Mild diffuse cortical atrophy is noted. Mild chronic ischemic white matter disease is noted. No mass effect or midline shift is noted. Ventricular size is within normal limits. There is no evidence of mass lesion, hemorrhage or acute infarction. Vascular: No hyperdense vessel or unexpected calcification. Skull: Normal. Negative for fracture or focal lesion. Sinuses/Orbits: No acute  finding. Other: None. IMPRESSION: Mild diffuse cortical atrophy. Mild chronic ischemic white matter disease. No acute intracranial abnormality seen. Electronically Signed   By: Marijo Conception M.D.   On: 01/08/2020 10:40   US RENAL  Result Date: 01/08/2020 CLINICAL DATA:  Acute kidney injury EXAM: RENAL / URINARY TRACT ULTRASOUND COMPLETE COMPARISON:  None. FINDINGS: Right Kidney: Renal measurements: 9.3 x 5 9 x 5.2 cm = volume: 150.4 mL . Echogenicity within normal limits. No mass or hydronephrosis visualized. Left Kidney: Renal measurements: 9.8 x 6.7 x 6.1 cm = volume: 209.2 mL. Echogenicity within normal limits. No mass or hydronephrosis visualized. Bladder: Appears normal for degree of bladder distention. Other: Technically difficult exam due to reported difficulties with patient positioning. IMPRESSION: Unremarkable renal ultrasound. Electronically Signed   By: Lovena Le M.D.   On: 01/08/2020 15:38   DG Chest Portable 1 View  Result Date: 01/08/2020 CLINICAL DATA:  Hypoxia EXAM: PORTABLE CHEST 1 VIEW COMPARISON:  None. FINDINGS: The heart size and mediastinal contours are within normal limits. Tortuosity of the thoracic aorta. Prominent bilateral interstitial lung markings without focal consolidation. No pleural effusion or pneumothorax. The visualized skeletal structures are unremarkable. IMPRESSION: 1. Prominent bilateral interstitial lung markings which could reflect edema versus atypical/viral infection. 2. Tortuosity of the thoracic aorta. Electronically Signed   By: Davina Poke D.O.   On: 01/08/2020 12:31     Medications:   . dextrose 130 mL/hr at 01/09/20 2302   . heparin  5,000 Units Subcutaneous Q8H  . influenza vaccine adjuvanted  0.5 mL Intramuscular Tomorrow-1000  . mouth rinse  15 mL Mouth Rinse BID  . pneumococcal 23 valent vaccine  0.5 mL Intramuscular Tomorrow-1000   acetaminophen **OR** acetaminophen, albuterol, hydrALAZINE, ondansetron (ZOFRAN) IV  Assessment/  Plan:  Mr. Zennon Freet is a 69 y.o. black male SNF resident with learning disability, prostate cancer, hypertension and hyperlipidemia, CVA who was admitted to [facility] on 01/08/2020 for Acute kidney failure, unspecified ([place]) [N17.9] Hypernatremia [E87.0] Altered mental status, unspecified altered mental status type [R41.82]  1. Acute renal failure: baseline creatinine of 0.95 with GFR >60 on 01/31/2019  2. Hypernatremia: free water deficit improved to 5.8 liters  3. Metabolic acidosis  4. Hypercalcemia: calcium now at goal.   5. Hypotension: due to volume depletion. Holding home regimen of tamsulosin and hydrochlorothiazide.   History and presentation are suggestive of dehydration and prerenal azotemia. Ultrasound with no obstruction - Continue dextrose infusion at 130mL/hr - Not a candidate for dailysis  Overall prognosis is very poor. Family meeting for later today.    LOS: 2 Ellamay Fors 1/15/20219:50 AM

## 2020-01-10 NOTE — Progress Notes (Signed)
[facility] paged to pt.'s room due to change in pt.'s condition.  RN shared pt. has been made DNR by family; teleconference planned for 12:30pm.  CH remains available as needed and may follow up w/family if time permits.  No further needs expressed at this time.      01/10/20 1200  Clinical Encounter Type  Visited With Patient;Health care provider  Visit Type Critical Care;Spiritual support  Referral From Nurse  Spiritual Encounters  Spiritual Needs Prayer;Emotional  Stress Factors  Patient Stress Factors Health changes

## 2020-01-10 NOTE — Consult Note (Signed)
Consultation Note Date: 01/10/2020   Patient Name: Samuel Shelton  DOB: 11/02/1951  MRN: [id number]  Age / Sex: 69 y.o., male   PCP: Sofie Hartigan, MD Referring Physician: Fritzi Mandes, MD   REASON FOR CONSULTATION:Establishing goals of care  Palliative Care consult requested for goals of care in this 69 y.o. male with multiple medical problems including mental retardation, hypertension, hyperlipidemia, stroke (expressive aphasia), and BPH. Mr Martian presented to ED from [facility] with complaints of altered mental status. On admission it was reported patient had decreased responsiveness and poor oral nutrition. Patient was COVID-19 positive on 12/13/19. During his ED work-up NA 170,  AKI with creatinine 3.15, BUN 146, potassium 4.2. Chest x-ray showed a bilateral interstitial marking without obvious infiltration.  CT head is negative for acute intracranial abnormalities. Since admission patient continues with poor po intake, gazed stare, and unresponsive.  Clinical Assessment and Goals of Care: I have reviewed medical records including lab results, imaging, Epic notes, and MAR, received report from the bedside RN, and assessed the patient. I spoke with niece, Samuel Shelton via phone to discuss diagnosis prognosis, [place], EOL wishes, disposition and options. Patient remains unresponsive.   I introduced Palliative Medicine as specialized medical care for people living with serious illness. It focuses on providing relief from the symptoms and stress of a serious illness. The goal is to improve quality of life for both the patient and the family. Family verbalized appreciation of our involvement.   We discussed a brief life review of the patient, along with his functional and nutritional status. Niece reports patient has been mentally challenged for his entire life. He never married or had children. When patient's mother passed away his aunt took over care. Unfortunately patient's aunt  became sick several years ago and that is how niece because patient's guardian/POA. Family reports patient is originally from [place] and they relocated to [place] over 5 years ago. He lived in the home with niece and her mother until 11/2018 when they placed him into Rehab and later long-term care at [facility].   Niece reports she has not been able to physically visit patient face to face but has come to the window to visit. She reports in November 2020 patient was ambulatory independently and eating and drinking. She reports although patient has experienced a known stroke he was highly functioning. Family reports he had no difficulty with eating and able to feed himself. Niece reports patient was not much of a talker and this diminished even further after his stroke, however in November he was able to answer yes, no, and respond in short phrases.   We discussed His current illness and what it means in the larger context of His on-going co-morbidities. With specific discussions regarding his severe dehydration, hypernatremia, failure to thrive, and his overall functional and nutritional decline. Natural disease trajectory and expectations at EOL were discussed.  Niece is tearful expressing she is distraught in the appearance of her uncle. She and other family recently video chat with patient. She reports he would not much respond much but she felt every time she called his name and expressed her love he "perked up". She reports "I cannot give up on him just yet. I think he is still in there and wants to fight!"   I attempted to explain patient's poor prognosis and concerns for worsening condition. Samuel Shelton verbalizes understanding but shares her family's strong Christian faith and hope in God changing patient's condition.  She spends time expressing her frustration with the SNF and their care. She is blaming them for his current state and feels they allowed him to decline for weeks without notifying her.  She is angry with COVID and that he contracted this virus at the facility from a staff member. Support given.   I again attempted to discuss patient's current condition and decline in the setting of COVID and co-morbidities. Samuel Shelton verbalized understanding.   I attempted to elicit values and goals of care important to the patient.    Niece confirms wishes to continue with full scope care allowing patient a chance to show signs of recovery. I discussed poor nutrition, artificial feeding, and limitations to care if patient continues to further decline. Family is requesting to continue with care. Niece is inquiring about feeding tube if patient does not show improvement or interest in eating. I dicussed artificial feeding in detail with her expressing risk of placement with consideration to Samuel Shelton overall condition and co-morbidities. I discuss at length patient will continue to have health complications despite PEG or artificial feedings and this would not improve his overall state of health and could contribute to further decline in the setting of aspiration, dislodgement, or intolerance. She verbalized understanding expressing she would need to think on it further and was hopeful she would not be faced with this decision.   She questions patient's code status (DNR). I explained appropriateness of DNR in patient's condition and recommended against consideration of rescinding to full code. Niece reports she is struggling with this decision after video chatting with patient and feelings that he is responding to her and other family members voices. She states she will not make any changes at this time unless she continues to struggle with her decision.   Niece, Samuel Shelton is patient's HCPOA.   Hospice and Palliative Care services outpatient were explained and offered. Patient and family verbalized their understanding and awareness of both palliative and hospice's goals and philosophy of care.  Family is not interested in hospice or comfort care at this time.   Questions and concerns were addressed.The family was encouraged to call with questions or concerns.  PMT will continue to support holistically.   SOCIAL HISTORY:     reports that he has quit smoking. His smoking use included cigarettes. He smoked 0.50 packs per day. He has never used smokeless tobacco. He reports that he does not drink alcohol or use drugs.  CODE STATUS: DNR  ADVANCE DIRECTIVES: Niece   SYMPTOM MANAGEMENT: per attending   Palliative Prophylaxis:   Aspiration, Bowel Regimen, Frequent Pain Assessment, Oral Care and Turn Reposition  PSYCHO-SOCIAL/SPIRITUAL:  Support System: Family  Desire for further Chaplaincy support: Yes   Additional Recommendations (Limitations, Scope, Preferences):  Continue to treat, DNR   PAST MEDICAL HISTORY: Past Medical History:  Diagnosis Date  . Chronic mental illness   . Edema of both legs   . Elevated PSA   . Elevated PSA   . Hypertension   . Mental retardation   . Phimosis     PAST SURGICAL HISTORY:  Past Surgical History:  Procedure Laterality Date  . PROSTATE BIOPSY N/A 06/22/2015   Procedure: PROSTATE BIOPSY;  Surgeon: Hollice Espy, MD;  Location: [facility] ORS;  Service: Urology;  Laterality: N/A;  . PROSTATE BIOPSY N/A 09/27/2016   Procedure: PROSTATE BIOPSY;  Surgeon: Hollice Espy, MD;  Location: [facility] ORS;  Service: Urology;  Laterality: N/A;    ALLERGIES:  has No Known Allergies.  MEDICATIONS:  Current Facility-Administered Medications  Medication Dose Route Frequency Provider Last Rate Last Admin  . acetaminophen (TYLENOL) tablet 650 mg  650 mg Oral Q6H PRN Ivor Costa, MD       Or  . acetaminophen (TYLENOL) suppository 650 mg  650 mg Rectal Q6H PRN Ivor Costa, MD      . albuterol (VENTOLIN HFA) 108 (90 Base) MCG/ACT inhaler 2 puff  2 puff Inhalation Q4H PRN Oswald Hillock, RPH      . dextrose 5 % solution   Intravenous Continuous Fritzi Mandes, MD 130 mL/hr at 01/10/20 1323 New Bag at 01/10/20 1323  . heparin injection 5,000 Units  5,000 Units Subcutaneous Q8H Ivor Costa, MD   5,000 Units at 01/10/20 0503  . hydrALAZINE (APRESOLINE) injection 5 mg  5 mg Intravenous Q2H PRN Ivor Costa, MD      . influenza vaccine adjuvanted (FLUAD) injection 0.5 mL  0.5 mL Intramuscular Tomorrow-1000 Ivor Costa, MD      . MEDLINE mouth rinse  15 mL Mouth Rinse BID Fritzi Mandes, MD   15 mL at 01/09/20 2102  . ondansetron (ZOFRAN) injection 4 mg  4 mg Intravenous Q8H PRN Ivor Costa, MD      . pneumococcal 23 valent vaccine (PNEUMOVAX-23) injection 0.5 mL  0.5 mL Intramuscular Tomorrow-1000 Ivor Costa, MD        VITAL SIGNS: BP 95/66 (BP Location: Right Arm)   Pulse 97   Temp 98 F (36.7 C) (Axillary)   Resp 20   Ht 6' (1.829 m)   Wt 85.7 kg   SpO2 99%   BMI 25.62 kg/m  Filed Weights   01/08/20 0950  Weight: 85.7 kg    Estimated body mass index is 25.62 kg/m as calculated from the following:   Height as of this encounter: 6' (1.829 m).   Weight as of this encounter: 85.7 kg.  LABS: CBC:    Component Value Date/Time   WBC 8.4 01/09/2020 0139   HGB 18.5 (H) 01/09/2020 0139   HCT 62.8 (H) 01/09/2020 0139   PLT 165 01/09/2020 0139   Comprehensive Metabolic Panel:    Component Value Date/Time   NA 159 (H) 01/10/2020 0048   K 3.8 01/10/2020 0048   CO2 21 (L) 01/10/2020 0048   BUN 156 (H) 01/10/2020 0048   CREATININE 3.64 (H) 01/10/2020 0048   ALBUMIN 4.0 01/08/2020 1118     Review of Systems  Unable to perform ROS: Acuity of condition   Unless otherwise noted, a complete review of systems is negative.   Prognosis: Poor   Discharge Planning:  To Be Determined  Recommendations:  DNR/DNI  Continue with current plan of care per attending  Niece verbalizes wishes for full scope aggressive care. She remains hopeful for improvement but also somewhat preparing for the worst. Verbalizes strong Christian faith and relying  on God to recover and improve patient. Discussed poor prognosis, however she is not accepting and feels he can make a recovery and return to state of health as she last saw him in November. Angry with facility and does not wish for him to return to [facility] and hoping for placement at another location.    PMT will continue to support and follow as needed. No weekend coverage.    Palliative Performance Scale: PPS 20%      The above conversation was completed via telephone due to the visitor restrictions during the COVID-19 pandemic. Thorough chart review and discussion with necessary members of  the care team was completed as part of assessment. All issues were discussed and addressed but no physical exam was performed.         Niece, Samuel Shelton expressed understanding and was in agreement with this plan.   Thank you for allowing the Palliative Medicine Team to assist in the care of this patient.  Time In: 1420 Time Out: 1515 Time Total: 55 min.   Visit consisted of counseling and education dealing with the complex and emotionally intense issues of symptom management and palliative care in the setting of serious and potentially life-threatening illness.Greater than 50%  of this time was spent counseling and coordinating care related to the above assessment and plan.  Signed by:  Alda Lea, AGPCNP-BC Palliative Medicine Team  Phone: [phone] Fax: [phone] Pager: [phone] Amion: Bjorn Pippin

## 2020-01-10 NOTE — Progress Notes (Addendum)
[facility] [place] NAME: Samuel Shelton    MR#:  [id number]  DATE OF BIRTH:  [birth year]  SUBJECTIVE:  patient is a resident [facility] since February last year after he had a stroke. Patient is unable to give any history review of systems. His eyes are staring but noncommunicative.  REVIEW OF SYSTEMS:   ROS Tolerating Diet: Tolerating PT:   DRUG ALLERGIES:  No Known Allergies  VITALS:  Blood pressure 95/66, pulse 97, temperature 98 F (36.7 C), temperature source Axillary, resp. rate 20, height 6' (1.829 m), weight 85.7 kg, SpO2 99 %.  PHYSICAL EXAMINATION:   Physical Exam-- limited exam  GENERAL:  69 y.o.-year-old patient lying in the bed with no acute distress.  Appears severely dehydrated, emaciated EYES: eyeballs sunken wHEENT: Head atraumatic, normocephalic. Oropharynx  dry LUNGS: Normal breath sounds bilaterally, no wheezing, rales, rhonchi. No use of accessory muscles of respiration.  CARDIOVASCULAR: S1, S2 normal. No murmurs, rubs, or gallops.  EXTREMITIES: No cyanosis, clubbing or edema b/l. Shrunken skin, dehydrated NEUROLOGIC: will to assess. Patient unresponsive noncommunicative  PSYCHIATRIC: eye open but does not respond to or verbalize  SKIN: No obvious rash, lesion, or ulcer-- per RN   LABORATORY PANEL:  CBC Recent Labs  Lab 01/09/20 0139  WBC 8.4  HGB 18.5*  HCT 62.8*  PLT 165    Chemistries  Recent Labs  Lab 01/08/20 1118 01/08/20 1314 01/10/20 0048  NA 170*   < > 159*  K 4.2   < > 3.8  CL 125*   < > 122*  CO2 29   < > 21*  GLUCOSE 151*   < > 170*  BUN 146*   < > 156*  CREATININE 3.15*   < > 3.64*  CALCIUM 10.4*   < > 9.2  AST 39  --   --   ALT 26  --   --   ALKPHOS 67  --   --   BILITOT 1.0  --   --    < > = values in this interval not displayed.   Cardiac Enzymes No results for input(s): TROPONINI in the last 168 hours. RADIOLOGY:  US RENAL  Result Date: 01/08/2020 CLINICAL  DATA:  Acute kidney injury EXAM: RENAL / URINARY TRACT ULTRASOUND COMPLETE COMPARISON:  None. FINDINGS: Right Kidney: Renal measurements: 9.3 x 5 9 x 5.2 cm = volume: 150.4 mL . Echogenicity within normal limits. No mass or hydronephrosis visualized. Left Kidney: Renal measurements: 9.8 x 6.7 x 6.1 cm = volume: 209.2 mL. Echogenicity within normal limits. No mass or hydronephrosis visualized. Bladder: Appears normal for degree of bladder distention. Other: Technically difficult exam due to reported difficulties with patient positioning. IMPRESSION: Unremarkable renal ultrasound. Electronically Signed   By: Lovena Le M.D.   On: 01/08/2020 15:38   DG Chest Portable 1 View  Result Date: 01/08/2020 CLINICAL DATA:  Hypoxia EXAM: PORTABLE CHEST 1 VIEW COMPARISON:  None. FINDINGS: The heart size and mediastinal contours are within normal limits. Tortuosity of the thoracic aorta. Prominent bilateral interstitial lung markings without focal consolidation. No pleural effusion or pneumothorax. The visualized skeletal structures are unremarkable. IMPRESSION: 1. Prominent bilateral interstitial lung markings which could reflect edema versus atypical/viral infection. 2. Tortuosity of the thoracic aorta. Electronically Signed   By: Davina Poke D.O.   On: 01/08/2020 12:31   ASSESSMENT AND PLAN:  Samuel Shelton is a 69 y.o. male with medical history significant of mental retardation, hypertension,  hyperlipidemia, stroke, BPH, who presents with altered mental status.  Severe Hypernatremia: Na 170.  Not very sure how acute it is.  -Free water deficit is 9.3 L per Dr. Juleen China. - Patient received 1.0 L of normal saline in emergency room.  -Renal, Dr. Juleen China was consulted--cont D5W at 130cc/h -ultrasound essentially nothing acute -check BMP q6h -check urine and plasma Osmo and urine sodium level - Serum Sodium 170--170--171--159 -urine output 120cc-- pt is anuric  severe dehydration due to poor PO intake/  Failure to thrive -on IVF as above -hold home HCTZ  Chronic mental disorder and acute metabolic encephalopathy, uremic -CT head is negative for acute intracranial abnormalities, likely due to electrolytes disturbance and worsening renal function -hold all oral meds and keep pt NPO until mental status improves -creatinine rising despite IV hydration. He is extremely  dehydrated  Hx of Stroke (cerebrum) ([place]): -hold ASA and lipitor now  Hyperlipidemia: -hold lipitor now  BPH (benign prostatic hyperplasia): -hold home Proscar and Flomax  Hypercalcemia: Ca 10.4, likely due to dehydration -IV fluids as above  AKI: Likely due to prerenal secondary to severe dehydration - IVF as above - Follow up renal function by BMP - Avoid using renal toxic medications, hypotension and contrast dye -hold HCTZ -US-renal --> unremarkable findings  Recent COVID-19 virus infection: Patient had a positive COVID-19 on 12/18, no fever.  -patient not in any respiratory distress. Sats are stable. -According to infection control okay to discontinue isolation  Lactic acidosis: Lactic acid 3.7 -->3.9. possibly due to dehydration. No fever or leukocytosis, does not seem to have bacterial infection or sepsis. -IV fluid as above -Trend lactic acid level  spoke with patient's niece Ms. Donna Christen this morning. Told her patient has a very poor prognosis and may not make this hospitalization. Discussed and readdressed code status. Patient is a DNR/DNI. Patient's niece will call nurse and do FaceTime.  Procedures: none Family communication : spoke with niece Ms. Donna Christen with patient's [facility] POA Consults : nephrology, palliative care  discharge Disposition : TBD CODE STATUS: DNR DVT Prophylaxis :[place]  TOTAL TIME TAKING CARE OF THIS PATIENT: *35* minutes.  >50% time spent on counselling and coordination of care  Note: This dictation was prepared with Dragon dictation along with smaller phrase technology.  Any transcriptional errors that result from this process are unintentional.  Fritzi Mandes M.D on 01/10/2020 at 10:39 AM  Between 7am to 6pm - Pager - [phone]  After 6pm go to [email or web address]  [facility]   CC: Primary care physician; Sofie Hartigan, MDPatient ID: Samuel Shelton, male   DOB: [birth year], 68 y.o.   MRN: [id number]

## 2020-01-10 NOTE — Significant Event (Addendum)
Rapid Response Event Note  Overview: Time Called: 1034 Arrival Time: [id number] Event Type: Neurologic  Initial Focused Assessment: Rapid response RN arrived in patient's room with patient staring off into space with RN at bedside. Per patient's RN, [place], patient admitted for altered mental status though baseline mental status questionable. Rapid response RN began assessment (patient not tracking nurses, HR 90s NSR) when charge nurse, Colletta Maryland, arrived and stated that Dr. Posey Pronto already had family on the phone and patient was DNR with family conference pending for comfort care. Patient protecting airway. Rapid response ended.  Interventions: none  Plan of Care (if not transferred): Patient to remain on 2A, family conference pending for transition to comfort care.  Event Summary: Name of Physician Notified: Dr. Fritzi Mandes at 1034    at    Outcome: Code status clarified  Event End Time: [address] [place]

## 2020-01-10 NOTE — Progress Notes (Signed)
[place] consulted w/RN to follow up on earlier discussion re: family conference.  RN shared family will come today; family still processing health changes.  RN has begun to play music in pt.'s rm. in response to family sharing pt. enjoys music.  [place] available to RN and family as needed.  No further needs expressed at this time.     01/10/20 1411  Clinical Encounter Type  Visited With Health care provider  Visit Type Follow-up  Referral From Chaplain  Recommendations  (RN will page [facility] if needed)  Stress Factors  Family Stress Factors Major life changes;Health changes;Loss

## 2020-01-10 NOTE — Progress Notes (Addendum)
Pt MEWS at 3. Pt VSS except BP 98/85 and MAP at 91, RR 27. Notify NP Randol Kern and ordered MBP stat and Mag level and recheck BP. Updated BP at 98/81 MAP 87. Notified Randol Kern. Will continue to monitor.  Update 2225: Lab called and reported critical lab  BUN of 163 and CR 6.91. Page prime. Will continue to monitor.  Update 2245: Pt was resting at this time. Will contineu to monitor.  Update 2300: Transfer ordered placed by [employer]. Will continue to m0nitor.  Update 0035: Pt was transfer to ICU. Niece Barrett Notify of the transfer. Will continue to monitor.

## 2020-01-10 NOTE — Progress Notes (Signed)
[place] visited pt. in 2A per RN referral.  RN shared that pt.'s condition is somewhat critical but family still coming to terms with situation.  Pt. in bed, eyes open and fixed on window; RN shared pt. could hear but unable to respond.  CH offered prayer for pt.'s peace and sense of divine presence.  CH remains available as needed.      01/10/20 1030  Clinical Encounter Type  Visited With Patient;Health care provider  Visit Type Spiritual support;Critical Care  Referral From Nurse  Spiritual Encounters  Spiritual Needs Prayer  Stress Factors  Patient Stress Factors Health changes  Family Stress Factors Loss;Major life changes;Health changes

## 2020-01-10 NOTE — Progress Notes (Signed)
SLP Cancellation Note  Patient Details Name: [name] MRN: [id number] DOB: 10-18-1951   Cancelled treatment:       Reason Eval/Treat Not Completed: Patient not medically ready;Medical issues which prohibited therapy(chart reviewed; consulted MD/NSG re: pt's status today)  MD requested ST services f/u tomorrow; pt's status not conducive for po trials at this time. Pt remains NPO. Recommend continue frequent oral care for hygiene and stimulation of swallowing.     Orinda Kenner, [place], CCC-SLP Nadelyn Enriques 01/10/2020, 2:05 PM

## 2020-01-10 NOTE — Progress Notes (Signed)
Report given at [facility]

## 2020-01-10 NOTE — Plan of Care (Signed)
  Problem: Safety: Goal: Ability to remain free from injury will improve Outcome: Progressing   

## 2020-01-10 NOTE — NC FL2 (Signed)
[place] LEVEL OF CARE SCREENING TOOL     IDENTIFICATION  Patient Name: Samuel Shelton Birthdate: 25-Sep-1951 Sex: male Admission Date (Current Location): 01/08/2020  [place] and [place] Number:  Engineering geologist and Address:  [facility], [address] [place], [place] [address]      Provider Number: [id number]  Attending Physician Name and Address:  Fritzi Mandes, MD  Relative Name and Phone Number:       Current Level of Care: Hospital Recommended Level of Care: [place] Prior Approval Number:    Date Approved/Denied:   PASRR Number: [id number]  Discharge Plan: SNF    Current Diagnoses: Patient Active Problem List   Diagnosis Date Noted  . DNR (do not resuscitate) discussion   . Hypernatremia 01/08/2020  . Hypercalcemia 01/08/2020  . Acute metabolic encephalopathy [id number]  . COVID-19 virus infection 01/08/2020  . AKI (acute kidney injury) ([place]) 01/08/2020  . Dehydration 01/08/2020  . Elevated troponin 01/08/2020  . Lactic acidosis 01/08/2020  . Altered mental status   . Hyperlipidemia 01/29/2019  . Family hx-stroke 01/29/2019  . BPH (benign prostatic hyperplasia) 01/29/2019  . 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 Edema leg 08/20/2014  . Chronic mental disorder 08/20/2014  . HTN (hypertension) 08/20/2014  . Intellectual disability 08/20/2014    Orientation RESPIRATION BLADDER Height & Weight     (unable to respond to orrientation questions per RN)  Normal Incontinent Weight: 188 lb 15 oz (85.7 kg) Height:  6' (182.9 cm)  BEHAVIORAL SYMPTOMS/MOOD NEUROLOGICAL BOWEL NUTRITION STATUS      Incontinent Diet(NPO at this time, please see d/c summary)  AMBULATORY STATUS COMMUNICATION OF NEEDS Skin   Extensive Assist Verbally Normal                       Personal Care Assistance Level of Assistance  Bathing, Feeding, Dressing Bathing Assistance: Maximum assistance Feeding assistance:  Independent Dressing Assistance: Maximum assistance     Functional Limitations Info  Sight, Speech, Hearing Sight Info: Adequate Hearing Info: Adequate Speech Info: Adequate    SPECIAL CARE FACTORS FREQUENCY  PT (By licensed PT), OT (By licensed OT)     PT Frequency: 5x OT Frequency: 5x            Contractures Contractures Info: Not present    Additional Factors Info  Code Status, Allergies, Isolation Precautions Code Status Info: DNR Allergies Info: No known allergies     Isolation Precautions Info: COVID+     Current Medications (01/10/2020):  This is the current hospital active medication list Current Facility-Administered Medications  Medication Dose Route Frequency Provider Last Rate Last Admin  . acetaminophen (TYLENOL) tablet 650 mg  650 mg Oral Q6H PRN Ivor Costa, MD       Or  . acetaminophen (TYLENOL) suppository 650 mg  650 mg Rectal Q6H PRN Ivor Costa, MD      . albuterol (VENTOLIN HFA) 108 (90 Base) MCG/ACT inhaler 2 puff  2 puff Inhalation Q4H PRN Oswald Hillock, RPH      . dextrose 5 % solution   Intravenous Continuous Fritzi Mandes, MD 130 mL/hr at 01/09/20 2302 New Bag at 01/09/20 2302  . heparin injection 5,000 Units  5,000 Units Subcutaneous Q8H Ivor Costa, MD   5,000 Units at 01/10/20 0503  . hydrALAZINE (APRESOLINE) injection 5 mg  5 mg Intravenous Q2H PRN Ivor Costa, MD      . influenza vaccine adjuvanted (FLUAD) injection 0.5 mL  0.5 mL Intramuscular Tomorrow-1000 Ivor Costa, MD      . MEDLINE mouth rinse  15 mL Mouth Rinse BID Fritzi Mandes, MD   15 mL at 01/09/20 2102  . ondansetron (ZOFRAN) injection 4 mg  4 mg Intravenous Q8H PRN Ivor Costa, MD      . pneumococcal 23 valent vaccine (PNEUMOVAX-23) injection 0.5 mL  0.5 mL Intramuscular Tomorrow-1000 Ivor Costa, MD         Discharge Medications: Please see discharge summary for a list of discharge medications.  Relevant Imaging Results:  Relevant Lab Results:   Additional  Information [id number]  Eileen Stanford, LCSW

## 2020-01-10 NOTE — Progress Notes (Addendum)
Initial assessment at 1015: Patient full code, and changed to DNR.  1430: patients family in room, educated on value of DNR status and educated on patient status, family expressed understanding   67: patients family member called and expressed desire to change code status to Full code, md notified

## 2020-01-10 NOTE — Progress Notes (Signed)
Turned patient to left side,pts breathing became shallow and tachypneic.  Upon assessment of patient, pt unable to respond to any stimuli, spontaneous movement of head to side, hands cool to touch, pts vitals stable.

## 2020-01-10 NOTE — Progress Notes (Signed)
[name], male   DOB: 1951-09-29, 69 y.o.   MRN: [id number]  Family wants to switch patient back to full code. Orders entered.

## 2020-01-11 LAB — URINALYSIS, COMPLETE (UACMP) WITH MICROSCOPIC
Bacteria, UA: NONE SEEN
Bilirubin Urine: NEGATIVE
Glucose, UA: NEGATIVE mg/dL
Hgb urine dipstick: NEGATIVE
Ketones, ur: NEGATIVE mg/dL
Leukocytes,Ua: NEGATIVE
Nitrite: NEGATIVE
Protein, ur: NEGATIVE mg/dL
Specific Gravity, Urine: 1.018 (ref 1.005–1.030)
Squamous Epithelial / HPF: NONE SEEN (ref 0–5)
pH: 5 (ref 5.0–8.0)

## 2020-01-11 LAB — HEPATIC FUNCTION PANEL
ALT: 27 U/L (ref 0–44)
AST: 39 U/L (ref 15–41)
Albumin: 3.7 g/dL (ref 3.5–5.0)
Alkaline Phosphatase: 76 U/L (ref 38–126)
Bilirubin, Direct: 0.2 mg/dL (ref 0.0–0.2)
Indirect Bilirubin: 1.1 mg/dL — ABNORMAL HIGH (ref 0.3–0.9)
Total Bilirubin: 1.3 mg/dL — ABNORMAL HIGH (ref 0.3–1.2)
Total Protein: 9.2 g/dL — ABNORMAL HIGH (ref 6.5–8.1)

## 2020-01-11 LAB — RESPIRATORY PANEL BY RT PCR (FLU A&B, COVID)
Influenza A by PCR: NEGATIVE
Influenza B by PCR: NEGATIVE
SARS Coronavirus 2 by RT PCR: POSITIVE — AB

## 2020-01-11 LAB — COMPREHENSIVE METABOLIC PANEL
ALT: 24 U/L (ref 0–44)
AST: 43 U/L — ABNORMAL HIGH (ref 15–41)
Albumin: 3.3 g/dL — ABNORMAL LOW (ref 3.5–5.0)
Alkaline Phosphatase: 72 U/L (ref 38–126)
Anion gap: 19 — ABNORMAL HIGH (ref 5–15)
BUN: 183 mg/dL — ABNORMAL HIGH (ref 8–23)
CO2: 23 mmol/L (ref 22–32)
Calcium: 8.6 mg/dL — ABNORMAL LOW (ref 8.9–10.3)
Chloride: 109 mmol/L (ref 98–111)
Creatinine, Ser: 7.06 mg/dL — ABNORMAL HIGH (ref 0.61–1.24)
GFR calc Af Amer: 8 mL/min — ABNORMAL LOW (ref >60)
GFR calc non Af Amer: 7 mL/min — ABNORMAL LOW (ref >60)
Glucose, Bld: 154 mg/dL — ABNORMAL HIGH (ref 70–99)
Potassium: 4.1 mmol/L (ref 3.5–5.1)
Sodium: 151 mmol/L — ABNORMAL HIGH (ref 135–145)
Total Bilirubin: 1.5 mg/dL — ABNORMAL HIGH (ref 0.3–1.2)
Total Protein: 8.8 g/dL — ABNORMAL HIGH (ref 6.5–8.1)

## 2020-01-11 LAB — STREP PNEUMONIAE URINARY ANTIGEN: Strep Pneumo Urinary Antigen: NEGATIVE

## 2020-01-11 LAB — TROPONIN I (HIGH SENSITIVITY): Troponin I (High Sensitivity): 52 ng/L — ABNORMAL HIGH (ref <18)

## 2020-01-11 LAB — GLUCOSE, CAPILLARY
Glucose-Capillary: 116 mg/dL — ABNORMAL HIGH (ref 70–99)
Glucose-Capillary: 131 mg/dL — ABNORMAL HIGH (ref 70–99)
Glucose-Capillary: 175 mg/dL — ABNORMAL HIGH (ref 70–99)

## 2020-01-11 LAB — INFLUENZA PANEL BY PCR (TYPE A & B)
Influenza A By PCR: NEGATIVE
Influenza B By PCR: NEGATIVE

## 2020-01-11 LAB — MRSA PCR SCREENING: MRSA by PCR: NEGATIVE

## 2020-01-11 LAB — MAGNESIUM: Magnesium: 3.9 mg/dL — ABNORMAL HIGH (ref 1.7–2.4)

## 2020-01-11 LAB — PHOSPHORUS: Phosphorus: 9.1 mg/dL — ABNORMAL HIGH (ref 2.5–4.6)

## 2020-01-11 MED ORDER — SODIUM CHLORIDE 0.45 % IV SOLN
INTRAVENOUS | Status: DC
  Administered 2020-01-12: 09:00:00 50 mL/h via INTRAVENOUS

## 2020-01-11 MED ORDER — MORPHINE SULFATE (PF) 2 MG/ML IV SOLN
1.0000 mg | INTRAVENOUS | Status: DC | PRN
  Administered 2020-01-13 – 2020-01-14 (×6): 1 mg via INTRAVENOUS
  Filled 2020-01-11 (×6): qty 1

## 2020-01-11 MED ORDER — ORAL CARE MOUTH RINSE
15.0000 mL | Freq: Two times a day (BID) | OROMUCOSAL | Status: DC
  Administered 2020-01-13 – 2020-01-26 (×13): 15 mL via OROMUCOSAL

## 2020-01-11 MED ORDER — SODIUM CHLORIDE 0.45 % IV SOLN
INTRAVENOUS | Status: DC
  Administered 2020-01-11: 150 mL/h via INTRAVENOUS

## 2020-01-11 MED ORDER — CHLORHEXIDINE GLUCONATE CLOTH 2 % EX PADS
6.0000 | MEDICATED_PAD | Freq: Every day | CUTANEOUS | Status: DC
  Administered 2020-01-15 – 2020-01-16 (×2): 6 via TOPICAL

## 2020-01-11 NOTE — Progress Notes (Signed)
Patient's decision maker called back and stated that the family did not want the IV stopped.  She asked for everything else to remain the same as discussed with the doctor, but to restart the IV "just in case he could be saved".  Family was advised that 4 members could visit but because they insisted on a covid retest and he was + that they could not enter his room without precautions.    An exception was granted for 4 visitors instead of 2 because he had covid from 12/18 (both MDs today said he would test positive but that did not make him contagious).  However, to protect his family, the [facility] & [facility] felt it best to have the family take precautions.  Phillis Knack, RN

## 2020-01-11 NOTE — Progress Notes (Signed)
SLP Cancellation Note  Patient Details Name: Samuel Shelton MRN: [id number] DOB: 1951/03/14   Cancelled treatment:       Reason Eval/Treat Not Completed: Patient not medically ready;Medical issues which prohibited therapy;Patient's level of consciousness(chart reviewed; pt had a decline in medical status; CCU)  Pt was transferred to CCU yesterday PM d/t decline in medical status. ST services will sign off d/t change in status and await new MD orders for BSE when appropriate. Recommend continue oral care for hygiene and stimulation of swallowing; aspiration precautions.     Orinda Kenner, MS, CCC-SLP [place] 01/11/2020, 9:33 AM

## 2020-01-11 NOTE — Progress Notes (Signed)
Admitted pt from 2 A patient unresponsive non communicative.  Dose not move any extremities.  Pt eye open but does not respond to or verbalize but will track staff with his eyes and moves head from side to side. Pupils are 55mm and reactive too light.

## 2020-01-11 NOTE — Progress Notes (Addendum)
[place] at [place] NAME: Samuel Shelton    MR#:  [id number]  DATE OF BIRTH:  [birth year]  SUBJECTIVE:  patient moved to ICU last night. Eyes open. Moves spontaneously his head. Does not respond to verbal commands.  patient is a resident [facility] since February last year after he had a stroke. Patient is unable to give any history review of systems.   REVIEW OF SYSTEMS:   Review of Systems  Unable to perform ROS: Medical condition     DRUG ALLERGIES:  No Known Allergies  VITALS:  Blood pressure (!) 140/105, pulse 84, temperature (!) 97.3 F (36.3 C), temperature source Oral, resp. rate 17, height 6' (1.829 m), weight 79.2 kg, SpO2 100 %.  PHYSICAL EXAMINATION:   Physical Exam-- limited exam  GENERAL:  69 y.o.-year-old patient lying in the bed with no acute distress.  Appears severely dehydrated, emaciated EYES: eyeballs sunken  HEENT: Head atraumatic, normocephalic. Oropharynx  dry LUNGS: Normal breath sounds bilaterally, no wheezing No use of accessory muscles of respiration.  CARDIOVASCULAR: S1, S2 normal. No murmurs EXTREMITIES: No cyanosis, clubbing or edema b/l.  NEUROLOGIC: Patient awake but does not communicate  PSYCHIATRIC: eye open but does not respond to verbal commands or verbalize  SKIN: No obvious rash, lesion, or ulcer-- per RN   LABORATORY PANEL:  CBC Recent Labs  Lab 01/09/20 0139  WBC 8.4  HGB 18.5*  HCT 62.8*  PLT 165    Chemistries  Recent Labs  Lab 01/11/20 0716  NA 151*  K 4.1  CL 109  CO2 23  GLUCOSE 154*  BUN 183*  CREATININE 7.06*  CALCIUM 8.6*  MG 3.9*  AST 43*  ALT 24  ALKPHOS 72  BILITOT 1.5*   Cardiac Enzymes No results for input(s): TROPONINI in the last 168 hours. RADIOLOGY:  No results found. ASSESSMENT AND PLAN:  [name] is a 69 y.o. male with medical history significant of mental retardation, hypertension, hyperlipidemia, stroke, BPH, who presents with  altered mental status.  Severe Hypernatremia: Na 170.  Not very sure how acute it is.  -Free water deficit is 9.3 L per Dr. Juleen China. - Patient received 1.0 L of normal saline in emergency room.  -Renal, Dr. Ebony Cargo  was consulted--cont 1/2 NS 130cc/h -ultrasound renal essentially nothing acute -check BMP q6h -check urine and plasma Osmo and urine sodium level - Serum Sodium 170--170--171--159--154--151 -urine output  miniaml-- pt is anuric  AKI with ATN secondary to severe dehydration - IVF as above - S creatinine worsening 2.96--3.6--6.4--7.06 - Avoid using renal toxic medications, hypotension and contrast dye -hold HCTZ -US-renal --> unremarkable finding  Chronic mental disorder and acute metabolic encephalopathy, uremic -CT head is negative for acute intracranial abnormalities, likely due to electrolytes disturbance and worsening renal function -hold all oral meds and keep pt NPO  -creatinine rising despite IV hydration.   Hx of Stroke (cerebrum) ([place]): -hold ASA and lipitor now  Hyperlipidemia: -hold lipitor now  BPH (benign prostatic hyperplasia): -hold home Proscar and Flomax  Hypercalcemia: Ca 10.4, likely due to dehydration -IV fluids as above  Recent COVID-19 virus infection: Patient had a positive COVID-19 on 12/18, no fever.  -patient not in any respiratory distress. Sats are stable. -According to infection control okay to discontinue isolation -Niece/HCPOA requesting repeat covid  Lactic acidosis: Lactic acid 3.7 -->3.9. possibly due to dehydration. No fever or leukocytosis, does not seem to have bacterial infection or sepsis. -IV fluid as  above -Trend lactic acid level  spoke with patient's niece Ms. Donna Christen this morning over speaker phone with Dr Candiss Norse and pt's RN.  Guarded prognosis Palliative care consult placed  Procedures: none Family communication : spoke with niece Ms. Donna Christen with patient's [facility] POA Consults : nephrology,  palliative care  discharge Disposition : TBD CODE STATUS:FUll code DVT Prophylaxis :he[place]in  TOTAL TIME TAKING CARE OF THIS PATIENT: *35* minutes.  >50% time spent on counselling and coordination of care  Note: This dictation was prepared with Dragon dictation along with smaller phrase technology. Any transcriptional errors that result from this process are unintentional.  Fritzi Mandes M.D on 01/11/2020 at 11:12 AM  Between 7am to 6pm - Pager - [phone]  After 6pm go to [email or web address]  [facility]   CC: Primary care physician; Sofie Hartigan, MDPatient ID: Samuel Shelton, male   DOB: [birth year], 69 y.o.   MRN: [id number]

## 2020-01-11 NOTE — Progress Notes (Addendum)
[name], male   DOB: 07-24-1951, 69 y.o.   MRN: [id number]  Met in person with patient's niece Samuel Shelton POA Samuel Shelton in the ICU patient's nurse shylon was present.  Ms. Samuel Shelton told me that  She spoke with her family and her mother and they requesting comfort care measures only. No labs or vital signs will be done. She understands patient will be on comfort care medications only. IV fluids will be stopped. Patient is a DNR/DNI. Currently will be on PRN IV morphine however if patient becomes more restless then will consider starting morphine drip. She voiced understanding and is in agreement with the plan.  Transfer to MedSurg floor when bed opens up. Time 20 mins

## 2020-01-11 NOTE — Progress Notes (Signed)
Notified earlier in shift patient with decreased mentation (seemed to be present from admission) and shallow breathing pattern.  VS astable.  Recent history for covid infection mid December, profound dehydration and hypernatremia on presentation what looks like now ATN. Review of chart showing hypernatremia but worsening renal function.  IV fluids adjusted to high volume 1/2 normal saline and patient transferred to ICU.  Still waiting on repeat labs. No urine output, nurse reports bladder scan 250 ml.  Foley placement ordered.

## 2020-01-11 NOTE — Progress Notes (Signed)
[facility], [place] 01/11/20  Subjective:   Hospital day # 3 Patient remains critically ill  cvs: Not on any pressors pulm: Currently on room air gi: Failed swallow test Renal: Critically elevated BUN and creatinine 01/15 0701 - 01/16 0700 In: 1654.2 [I.V.:1654.2] Out: 225 [Urine:225] Lab Results  Component Value Date   CREATININE 7.06 (H) 01/11/2020   CREATININE 6.40 (H) 01/10/2020   CREATININE 3.64 (H) 01/10/2020     Objective:  Vital signs in last 24 hours:  Temp:  [96.7 F (35.9 C)-98.7 F (37.1 C)] 97.3 F (36.3 C) (01/16 0600) Pulse Rate:  [80-99] 84 (01/16 0700) Resp:  [17-27] 17 (01/16 0700) BP: (82-140)/(59-105) 140/105 (01/16 0700) SpO2:  [95 %-100 %] 100 % (01/16 0700) Weight:  [79.2 kg] 79.2 kg (01/16 0108)  Weight change:  Filed Weights   01/08/20 0950 01/11/20 0108  Weight: 85.7 kg 79.2 kg    Intake/Output:    Intake/Output Summary (Last 24 hours) at 01/11/2020 1033 Last data filed at 01/11/2020 0700 Gross per 24 hour  Intake 1654.23 ml  Output 225 ml  Net 1429.23 ml     Physical Exam: General:  No acute distress, chronically ill-appearing, cachectic  HEENT  dry oral mucous membranes  Pulm/lungs  room air, normal breathing effort  CVS/Heart  regular rhythm  Abdomen:   Soft, nontender  Extremities:  No peripheral  Neurologic:  Did not follow any commands, eyes open  Skin:  Warm  Foley catheter in place with dark concentrated appearing urine  Basic Metabolic Panel:  Recent Labs  Lab 01/09/20 1320 01/09/20 1320 01/09/20 1840 01/09/20 1840 01/10/20 0048 01/10/20 2111 01/11/20 0716  NA 165*  --  164*  --  159* 154* 151*  K 3.9  --  4.6  --  3.8 4.2 4.1  CL 126*  --  124*  --  122* 116* 109  CO2 24  --  27  --  21* 19* 23  GLUCOSE 160*  --  155*  --  170* 202* 154*  BUN 150*  --  144*  --  156* 163* 183*  CREATININE 2.96*  --  3.09*  --  3.64* 6.40* 7.06*  CALCIUM 9.6   < > 9.6   < > 9.2 8.8* 8.6*  MG   --   --   --   --   --  4.4* 3.9*  PHOS  --   --   --   --   --   --  9.1*   < > = values in this interval not displayed.     CBC: Recent Labs  Lab 01/08/20 1118 01/09/20 0139  WBC 8.6 8.4  NEUTROABS 6.2  --   HGB 19.8* 18.5*  HCT 68.1* 62.8*  MCV 95.8 94.7  PLT 160 165     No results found for: HEPBSAG, HEPBSAB, HEPBIGM    Microbiology:  Recent Results (from the past 240 hour(s))  MRSA PCR Screening     Status: None   Collection Time: 01/08/20  5:10 PM   Specimen: Urine, Clean Catch; Nasopharyngeal  Result Value Ref Range Status   MRSA by PCR NEGATIVE NEGATIVE Final    Comment:        The GeneXpert MRSA Assay (FDA approved for NASAL specimens only), is one component of a comprehensive MRSA colonization surveillance program. It is not intended to diagnose MRSA infection nor to guide or monitor treatment for MRSA infections. Performed at [facility], [address] [place], [place] [address]   MRSA PCR Screening     Status: None   Collection Time: 01/11/20  2:16 AM   Specimen: Nasopharyngeal  Result Value Ref Range Status   MRSA by PCR NEGATIVE NEGATIVE Final    Comment:        The GeneXpert MRSA Assay (FDA approved for NASAL specimens only), is one component of a comprehensive MRSA colonization surveillance program. It is not intended to diagnose MRSA infection nor to guide or monitor treatment for MRSA infections. Performed at [facility], [address] [place], [place] [address]     Coagulation Studies: No results for input(s): LABPROT, INR in the last 72 hours.  Urinalysis: Recent Labs    01/08/20 1739 01/11/20 0215  COLORURINE YELLOW* YELLOW*  LABSPEC 1.020 1.018  PHURINE 5.0 5.0  GLUCOSEU NEGATIVE NEGATIVE  HGBUR NEGATIVE NEGATIVE  BILIRUBINUR NEGATIVE NEGATIVE  KETONESUR NEGATIVE NEGATIVE  PROTEINUR NEGATIVE NEGATIVE  NITRITE NEGATIVE NEGATIVE  LEUKOCYTESUR NEGATIVE NEGATIVE      Imaging: No results  found.   Medications:   . sodium chloride 150 mL/hr at 01/11/20 0746   . Chlorhexidine Gluconate Cloth  6 each Topical Daily  . heparin  5,000 Units Subcutaneous Q8H  . influenza vaccine adjuvanted  0.5 mL Intramuscular Tomorrow-1000  . mouth rinse  15 mL Mouth Rinse q12n4p  . pneumococcal 23 valent vaccine  0.5 mL Intramuscular Tomorrow-1000   acetaminophen **OR** acetaminophen, albuterol, hydrALAZINE, ondansetron (ZOFRAN) IV  Assessment/ Plan:  69 y.o. male with history of mental retardation, hypertension, hyperlipidemia, stroke 2019, BPH, resident of [facility] home Covid virus infection 12/ 2019 admitted on 01/08/2020 for Acute kidney failure, unspecified ([place]) [N17.9] Hypernatremia [E87.0] Altered mental status, unspecified altered mental status type [R41.82]  #Acute renal failure Baseline creatinine of 0.95 in February 2020 Admission creatinine of 3.15, BUN 146 Progressively serum creatinine has increased to 7.06 today with BUN of 183 Phosphorus of 9.1 AKI likely secondary to ATN from Severe dehydration.  Recent history of COVID-19 infection in December 2020 Currently getting treatment with IV fluids, half-normal saline at 50 cc/h Urine output is poor.  Urinalysis at admission is negative for blood or protein. Renal U/S neg for obstruction Due to patient's poor baseline health status, he will not be a candidate for outpatient dialysis.  Discussed with family and do not recommend aggressive interventions.  Patient's niece Ms. Jolayne Haines who will discuss with rest of the family Continue if iv fluids to correct severe dehydration  #Severe hypernatremia  patient presented with sodium of 170 Currently being treated with half-normal saline Sodium has improved to 151 today   LOS: [place] 1/16/202110:33 AM  [place], [place]  Note: This note was prepared with Dragon dictation. Any transcription errors are  unintentional

## 2020-01-12 NOTE — Progress Notes (Signed)
[name], male   DOB: 1951-04-01, 69 y.o.   MRN: [id number]  Spoke with Jolayne Haines Niece [facility] POA updated. Continue comfort care. CSW for hospice home placement

## 2020-01-12 NOTE — Progress Notes (Signed)
[facility] at [place] NAME: Samuel Shelton    MR#:  [id number]  DATE OF BIRTH:  02-17-1951  SUBJECTIVE:   Eyes open. Moves spontaneously his head.  Respond some  to verbal commands.  patient is a resident [facility] since February last year after he had a stroke. Patient is unable to give any history review of systems.   REVIEW OF SYSTEMS:   Review of Systems  Unable to perform ROS: Medical condition     DRUG ALLERGIES:  No Known Allergies  VITALS:  Blood pressure 117/82, pulse 72, temperature 98.9 F (37.2 C), temperature source Axillary, resp. rate 14, height 6' (1.829 m), weight 79.2 kg, SpO2 99 %.  PHYSICAL EXAMINATION:   Physical Exam-- limited exam  GENERAL:  69 y.o.-year-old patient lying in the bed with no acute distress.  Appears severely dehydrated, emaciated EYES: eyeballs sunken  HEENT: Head atraumatic, normocephalic. Oropharynx  dry LUNGS: Normal breath sounds bilaterally, no wheezing No use of accessory muscles of respiration.  CARDIOVASCULAR: S1, S2 normal. No murmurs EXTREMITIES: No cyanosis, clubbing or edema b/l.  NEUROLOGIC: Patient awake but does not communicate  PSYCHIATRIC: eye open but does not respond to verbal commands or verbalize  SKIN: No obvious rash, lesion, or ulcer-- per RN   LABORATORY PANEL:  CBC Recent Labs  Lab 01/09/20 0139  WBC 8.4  HGB 18.5*  HCT 62.8*  PLT 165    Chemistries  Recent Labs  Lab 01/11/20 0716  NA 151*  K 4.1  CL 109  CO2 23  GLUCOSE 154*  BUN 183*  CREATININE 7.06*  CALCIUM 8.6*  MG 3.9*  AST 43*  ALT 24  ALKPHOS 72  BILITOT 1.5*   Cardiac Enzymes No results for input(s): TROPONINI in the last 168 hours. RADIOLOGY:  No results found. ASSESSMENT AND PLAN:  Samuel Shelton is a 69 y.o. male with medical history significant of mental retardation, hypertension, hyperlipidemia, stroke, BPH, who presents with altered mental status.  Severe  Hypernatremia: Na 170.  Not very sure how acute it is.  -ultrasound renal essentially nothing acute - Serum Sodium 170--170--171--159--154--151 - pt is anuric  AKI with ATN secondary to severe dehydration - S creatinine worsening 2.96--3.6--6.4--7.06 --US-renal --> unremarkable finding  Chronic mental disorder and acute metabolic encephalopathy, uremic -CT head is negative for acute intracranial abnormalities, likely due to electrolytes disturbance and worsening renal function  Hx of Stroke (cerebrum) ([place]):  Hyperlipidemia:  BPH (benign prostatic hyperplasia):  Hypercalcemia:   Recent COVID-19 virus infection: Patient had a positive COVID-19 on 12/18, no fever.   Lactic acidosis:   patient is comfort care only. TOC consult for possible hospice home placement Guarded prognosis   Procedures: none Family communication :  niece Ms. Donna Christen  patient's [facility] POA Consults : nephrology, palliative care  discharge Disposition : TBD ?hospice CODE STATUS:DNR DVT Prophylaxis comfort care  TOTAL TIME TAKING CARE OF THIS PATIENT: *15* minutes.  >50% time spent on counselling and coordination of care  Note: This dictation was prepared with Dragon dictation along with smaller phrase technology. Any transcriptional errors that result from this process are unintentional.  Fritzi Mandes M.D on 01/12/2020 at 1:16 PM  Between 7am to 6pm - Pager - [phone]  After 6pm go to [email or web address]  [facility]   CC: Primary care physician; Sofie Hartigan, MDPatient ID: Samuel Shelton, male   DOB: 06/16/1951, 69 y.o.   MRN: [id number]

## 2020-01-12 NOTE — Progress Notes (Signed)
DO NOT STOP THE IV.  You may change the fluids per MD orders or the rate, but per family request and MD order (Dr. Fritzi Mandes) please keep the IV going at no more than 50 mL/hr.  The family wants comfort care but "does not want him dehydrated".  Family believes patient was not given fluids in his nursing home.  Patient cannot speak.  He is able to nod yes and shake for no.  MD spoke with family when they had questions and were nervous.  Family visited today (2 nieces).  MDs yesterday gave family permission for 4 visitors but they must stay the same 4 all admission (while on comfort care).  Phillis Knack, RN

## 2020-01-12 NOTE — Progress Notes (Signed)
Phone call to patient's niece [name], [phone] to discuss recommendation for hospice home.  Ms. Laurance Flatten was not able to speak with this social worker at this time and requested a return call tomorrow.  Social Worker to reach out to patient tomorrow to discuss plan for residential hospice.   [place], [place] Social Work [phone]

## 2020-01-13 NOTE — TOC Progression Note (Signed)
Transition of Care [facility]) - Progression Note    Patient Details  Name: Samuel Shelton MRN: [id number] Date of Birth: 1951-07-25  Transition of Care [facility]) CM/SW Contact  Samuel Ammons, RN Phone Number: 01/13/2020, 3:55 PM  Clinical Narrative:   RNCM received message from MD requesting call back to patient's family to discuss plans for discharge. RNCM placed call to patient's niece Samuel Shelton, after introductions this CM informed niece that this call was to discuss possible Hospice placement for patient due to continued decline and now comfort care. Initially niece verbalizing numerous questions regarding patient's status, comfort care, DNR and current medical issues. Answered questions as able and offered to have MD follow up with phone call however after some discussion niece reported that she didn't need to talk to MD again and she has left it in God's hands and that she is ok with Hospice referral.  RNCM spoke with [facility] rep and they don't currently have any open beds. Placed call to [facility] and after some discussion was informed that patient did not meet criteria for their inpatient Hospice.  RNCM returned call to Dr. Posey Pronto to discuss and she requested patient be placed on waiting list for [facility]. Samuel Shelton notified.          Expected Discharge Plan and Services                                                 Social Determinants of Health (SDOH) Interventions    Readmission Risk Interventions No flowsheet data found.

## 2020-01-13 NOTE — Progress Notes (Signed)
New referral for [facility] hospice home received from [facility]. [facility] is unable to offer a bed today. Hospital Liaison will follow up with hospital team tomorrow. Patient information given to referral. Flo Shanks RN, BSN, [place] [phone]

## 2020-01-13 NOTE — Progress Notes (Addendum)
[facility] [place] NAME: Samuel Shelton    MR#:  [id number]  DATE OF BIRTH:  1951/04/19  SUBJECTIVE:   Eyes open. Moves spontaneously his head.   REVIEW OF SYSTEMS:   Review of Systems  Unable to perform ROS: Medical condition   DRUG ALLERGIES:  No Known Allergies  VITALS:  Blood pressure 122/81, pulse 71, temperature 97.8 F (36.6 C), temperature source Axillary, resp. rate 11, height 6' (1.829 m), weight 79.2 kg, SpO2 97 %.  PHYSICAL EXAMINATION:   Physical Exam-- limited exam--comfort care  GENERAL:  69 y.o.-year-old patient lying in the bed with no acute distress.  Appears severely dehydrated LUNGS: Normal breath sounds bilaterally, no wheezing No use of accessory muscles of respiration.  CARDIOVASCULAR: S1, S2 normal.  NEUROLOGIC: Patient eyes open but does not communicate  SKIN: No obvious rash, lesion, or ulcer-- per RN  LABORATORY PANEL:  CBC Recent Labs  Lab 01/09/20 0139  WBC 8.4  HGB 18.5*  HCT 62.8*  PLT 165    Chemistries  Recent Labs  Lab 01/11/20 0716  NA 151*  K 4.1  CL 109  CO2 23  GLUCOSE 154*  BUN 183*  CREATININE 7.06*  CALCIUM 8.6*  MG 3.9*  AST 43*  ALT 24  ALKPHOS 72  BILITOT 1.5*   Cardiac Enzymes No results for input(s): TROPONINI in the last 168 hours. RADIOLOGY:  No results found. ASSESSMENT AND PLAN:  [name] is a 69 y.o. male with medical history significant of mental retardation, hypertension, hyperlipidemia, stroke, BPH, who presents with altered mental status.  Severe Hypernatremia: Na 170.  Not very sure how acute it is.  -ultrasound renal essentially nothing acute - Serum Sodium 170--170--171--159--154--151 - pt is anuric -Niece request IVF while pt is here  AKI with ATN secondary to severe dehydration - S creatinine worsening 2.96--3.6--6.4--7.06 --US-renal --> unremarkable finding -Nephrology consult with Dr Bebe Liter off  Chronic mental  disorder and acute metabolic encephalopathy, uremic -CT head is negative for acute intracranial abnormalities, likely due to electrolytes disturbance and worsening renal function  Hypercalcemia   Recent COVID-19 virus infection: Patient had a positive COVID-19 on 12/18, no fever.   Lactic acidosis  patient is comfort care only. TOC consult for hospice home placement-- No bed availability today Guarded prognosis  Family communication :  niece Ms. Donna Christen  patient's [facility] POA Consults : nephrology, palliative care  discharge Disposition : TBD ?hospice CODE STATUS:DNR DVT Prophylaxis comfort care  TOTAL TIME TAKING CARE OF THIS PATIENT: *15* minutes.  >50% time spent on counselling and coordination of care  Note: This dictation was prepared with Dragon dictation along with smaller phrase technology. Any transcriptional errors that result from this process are unintentional.  Fritzi Mandes M.D on 01/13/2020 at 2:57 PM  [facility]   CC: Primary care physician; Samuel Shelton, MDPatient ID: Samuel Shelton, male   DOB: 1951/03/13, 69 y.o.   MRN: [id number]

## 2020-01-14 ENCOUNTER — Other Ambulatory Visit: Payer: Self-pay

## 2020-01-14 MED ORDER — DEXTROSE 5 % IV SOLN: INTRAVENOUS | Status: DC

## 2020-01-14 NOTE — TOC Progression Note (Signed)
Transition of Care [facility]) - Progression Note    Patient Details  Name: Keynon Thul MRN: [id number] Date of Birth: 06-07-1951  Transition of Care [facility]) CM/SW Contact  Candie Chroman, LCSW Phone Number: 01/14/2020, 10:55 AM  Clinical Narrative:  No bed at hospice house today.   Expected Discharge Plan and Services                                                 Social Determinants of Health (SDOH) Interventions    Readmission Risk Interventions No flowsheet data found.

## 2020-01-14 NOTE — Progress Notes (Signed)
[facility] is unable to offer a bed at the hospice home today. TOC [place]rape made aware.Will continue to follow and update hospital care team regarding bed availability. Flo Shanks BSN, RN, [place] [phone]

## 2020-01-14 NOTE — Progress Notes (Signed)
[facility], [place] 01/14/20  Subjective:   Hospital day # 6 Patient remains critically ill cvs: Not on any pressors pulm: Currently on room air Appears more alert  Renal: Critically elevated BUN and creatinine 01/18 0701 - 01/19 0700 In: 1091.8 [I.V.:1091.8] Out: 750 [Urine:750] Lab Results  Component Value Date   CREATININE 7.06 (H) 01/11/2020   CREATININE 6.40 (H) 01/10/2020   CREATININE 3.64 (H) 01/10/2020     Objective:  Vital signs in last 24 hours:  Temp:  [97.7 F (36.5 C)-97.9 F (36.6 C)] 97.7 F (36.5 C) (01/19 0831) Pulse Rate:  [70-75] 75 (01/19 0831) Resp:  [14] 14 (01/19 0831) BP: (123-135)/(82-90) 123/90 (01/19 0831) SpO2:  [100 %] 100 % (01/19 0831)  Weight change:  Filed Weights   01/08/20 0950 01/11/20 0108  Weight: 85.7 kg 79.2 kg    Intake/Output:    Intake/Output Summary (Last 24 hours) at 01/14/2020 1605 Last data filed at 01/14/2020 1557 Gross per 24 hour  Intake 1418.5 ml  Output 1050 ml  Net 368.5 ml     Physical Exam: General:  No acute distress, chronically ill-appearing, cachectic  HEENT  dry oral mucous membranes  Pulm/lungs  room air, normal breathing effort  CVS/Heart  regular rhythm  Abdomen:   Soft, nontender  Extremities:  No peripheral  Neurologic:  alert, following commands  Skin:  Warm  Foley catheter in place    Basic Metabolic Panel:  Recent Labs  Lab 01/09/20 1320 01/09/20 1320 01/09/20 1840 01/09/20 1840 01/10/20 0048 01/10/20 2111 01/11/20 0716  NA 165*  --  164*  --  159* 154* 151*  K 3.9  --  4.6  --  3.8 4.2 4.1  CL 126*  --  124*  --  122* 116* 109  CO2 24  --  27  --  21* 19* 23  GLUCOSE 160*  --  155*  --  170* 202* 154*  BUN 150*  --  144*  --  156* 163* 183*  CREATININE 2.96*  --  3.09*  --  3.64* 6.40* 7.06*  CALCIUM 9.6   < > 9.6   < > 9.2 8.8* 8.6*  MG  --   --   --   --   --  4.4* 3.9*  PHOS  --   --   --   --   --   --  9.1*   < > = values in this  interval not displayed.     CBC: Recent Labs  Lab 01/08/20 1118 01/09/20 0139  WBC 8.6 8.4  NEUTROABS 6.2  --   HGB 19.8* 18.5*  HCT 68.1* 62.8*  MCV 95.8 94.7  PLT 160 165     No results found for: HEPBSAG, HEPBSAB, HEPBIGM    Microbiology:  Recent Results (from the past 240 hour(s))  MRSA PCR Screening     Status: None   Collection Time: 01/08/20  5:10 PM   Specimen: Urine, Clean Catch; Nasopharyngeal  Result Value Ref Range Status   MRSA by PCR NEGATIVE NEGATIVE Final    Comment:        The GeneXpert MRSA Assay (FDA approved for NASAL specimens only), is one component of a comprehensive MRSA colonization surveillance program. It is not intended to diagnose MRSA infection nor to guide or monitor treatment for MRSA infections. Performed at [facility], [address], [place], [place] [address]   MRSA PCR Screening     Status: None  Collection Time: 01/11/20  2:16 AM   Specimen: Nasopharyngeal  Result Value Ref Range Status   MRSA by PCR NEGATIVE NEGATIVE Final    Comment:        The GeneXpert MRSA Assay (FDA approved for NASAL specimens only), is one component of a comprehensive MRSA colonization surveillance program. It is not intended to diagnose MRSA infection nor to guide or monitor treatment for MRSA infections. Performed at [facility], [address] [place], [place] [address]   Respiratory Panel by RT PCR (Flu A&B, Covid) - Nasopharyngeal Swab     Status: Abnormal   Collection Time: 01/11/20  2:12 PM   Specimen: Nasopharyngeal Swab  Result Value Ref Range Status   SARS Coronavirus 2 by RT PCR POSITIVE (A) NEGATIVE Final    Comment: RESULT CALLED TO, READ BACK BY AND VERIFIED WITH: [facility] SMITH AT [id number] 01/11/20.PMF (NOTE) SARS-CoV-2 target nucleic acids are DETECTED. SARS-CoV-2 RNA is generally detectable in upper respiratory specimens  during the acute phase of infection. Positive results are indicative of the  presence of the identified virus, but do not rule out bacterial infection or co-infection with other pathogens not detected by the test. Clinical correlation with patient history and other diagnostic information is necessary to determine patient infection status. The expected result is Negative. Fact Sheet for Patients:  PinkCheek.be Fact Sheet for Healthcare Providers: GravelBags.it This test is not yet approved or cleared by the [place] FDA and  has been authorized for detection and/or diagnosis of SARS-CoV-2 by FDA under an Emergency Use Authorization (EUA).  This EUA will remain in effect (meaning this test can be used) for  the duration of  the COVID-19 declaration under Section 564(b)(1) of the Act, 21 U.S.C. section 360bbb-3(b)(1), unless the authorization is terminated or revoked sooner.    Influenza A by PCR NEGATIVE NEGATIVE Final   Influenza B by PCR NEGATIVE NEGATIVE Final    Comment: (NOTE) The Xpert Xpress SARS-CoV-2/FLU/RSV assay is intended as an aid in  the diagnosis of influenza from Nasopharyngeal swab specimens and  should not be used as a sole basis for treatment. Nasal washings and  aspirates are unacceptable for Xpert Xpress SARS-CoV-2/FLU/RSV  testing. Fact Sheet for Patients: PinkCheek.be Fact Sheet for Healthcare Providers: GravelBags.it This test is not yet approved or cleared by the [place] FDA and  has been authorized for detection and/or diagnosis of SARS-CoV-2 by  FDA under an Emergency Use Authorization (EUA). This EUA will remain  in effect (meaning this test can be used) for the duration of the  Covid-19 declaration under Section 564(b)(1) of the Act, 21  U.S.C. section 360bbb-3(b)(1), unless the authorization is  terminated or revoked. Performed at [facility], [address] [place],  [address]      Coagulation Studies: No results for input(s): LABPROT, INR in the last 72 hours.  Urinalysis: No results for input(s): COLORURINE, LABSPEC, PHURINE, GLUCOSEU, HGBUR, BILIRUBINUR, KETONESUR, PROTEINUR, UROBILINOGEN, NITRITE, LEUKOCYTESUR in the last 72 hours.  Invalid input(s): APPERANCEUR    Imaging: No results found.   Medications:   . dextrose 75 mL/hr at 01/14/20 1558   . Chlorhexidine Gluconate Cloth  6 each Topical Daily  . mouth rinse  15 mL Mouth Rinse q12n4p   acetaminophen **OR** acetaminophen, ondansetron (ZOFRAN) IV  Assessment/ Plan:  69 y.o. male with history of mental retardation, hypertension, hyperlipidemia, stroke 2019, BPH, resident of [facility] home Covid virus infection 12/ 2019 admitted on 01/08/2020 for Acute kidney  failure, unspecified ([place]) [N17.9] Hypernatremia [E87.0] Altered mental status, unspecified altered mental status type [R41.82]  #Acute renal failure Baseline creatinine of 0.95 in February 2020 Admission creatinine of 3.15, BUN 146 Progressively serum creatinine has increased to 7.06 with BUN of 183 Phosphorus of 9.1 AKI likely secondary to ATN from Severe dehydration.  Recent history of COVID-19 infection in December 2020  Renal U/S neg for obstruction Conservative management  #Severe hypernatremia patient presented with sodium of 170 D5W at 75 cc/hr Follow labs closely  Case d/w Patient's niece - Ms [name] [phone]   LOS: [place] 1/19/20214:05 [facility], [place]  Note: This note was prepared with Dragon dictation. Any transcription errors are unintentional

## 2020-01-14 NOTE — Progress Notes (Signed)
Report given to [place], RN, patient transported to room 209 via transporter.  Called niece, left message.  Clarise Cruz, BSN

## 2020-01-14 NOTE — Progress Notes (Signed)
[facility] at [place] NAME: Samuel Shelton    MR#:  [id number]  DATE OF BIRTH:  [birth year]  SUBJECTIVE:   Sitting up--watching TV Does not communicate  REVIEW OF SYSTEMS:   Review of Systems  Unable to perform ROS: Medical condition   DRUG ALLERGIES:  No Known Allergies  VITALS:  Blood pressure 123/90, pulse 75, temperature 97.7 F (36.5 C), temperature source Oral, resp. rate 14, height 6' (1.829 m), weight 79.2 kg, SpO2 100 %.  PHYSICAL EXAMINATION:   Physical Exam-- limited exam due pt unable to participate  GENERAL:  69 y.o.-year-old patient lying in the bed with no acute distress.  Appears severely dehydrated LUNGS: Normal breath sounds bilaterally, no wheezing No use of accessory muscles of respiration.  CARDIOVASCULAR: S1, S2 normal.  NEUROLOGIC: Patient eyes open but does not communicate  SKIN: No obvious rash, lesion, or ulcer-- per RN  LABORATORY PANEL:  CBC Recent Labs  Lab 01/09/20 0139  WBC 8.4  HGB 18.5*  HCT 62.8*  PLT 165    Chemistries  Recent Labs  Lab 01/11/20 0716  NA 151*  K 4.1  CL 109  CO2 23  GLUCOSE 154*  BUN 183*  CREATININE 7.06*  CALCIUM 8.6*  MG 3.9*  AST 43*  ALT 24  ALKPHOS 72  BILITOT 1.5*   Cardiac Enzymes No results for input(s): TROPONINI in the last 168 hours. RADIOLOGY:  No results found. ASSESSMENT AND PLAN:  Samuel Shelton is a 69 y.o. male with medical history significant of mental retardation, hypertension, hyperlipidemia, stroke, BPH, who presents with altered mental status.  Severe Hypernatremia: Na 170.   -ultrasound renal essentially nothing acute - Serum Sodium 170--170--171--159--154--151 - pt is anuric -Niece request IVF, labs and would like ot cont conservative management.  AKI with ATN secondary to severe dehydration - S creatinine worsening 2.96--3.6--6.4--7.06 --US-renal --> unremarkable finding -Nephrology consult with Dr Candiss Norse--- continue  conservative management with IVF  Chronic mental disorder and acute metabolic encephalopathy, uremic -CT head is negative for acute intracranial abnormalities, likely due to electrolytes disturbance and worsening renal function  Hypercalcemia  -IVF  Recent COVID-19 virus infection: Patient had a positive COVID-19 on 12/18, no fever.   Guarded prognosis. TOC consult for SNF placement  Family communication :  niece Ms. Samuel Shelton  patient's [facility] POA Consults : nephrology, palliative care  discharge Disposition : TBD CODE STATUS:DNR DVT Prophylaxis scd  TOTAL TIME TAKING CARE OF THIS PATIENT: *15* minutes.  >50% time spent on counselling and coordination of care  Note: This dictation was prepared with Dragon dictation along with smaller phrase technology. Any transcriptional errors that result from this process are unintentional.  Samuel Shelton M.D on 01/14/2020 at 5:56 PM  [facility]   CC: Primary care physician; Samuel Shelton, MDPatient ID: Samuel Shelton, male   DOB: 07-10-1951, 69 y.o.   MRN: [id number]

## 2020-01-14 NOTE — Progress Notes (Addendum)
[name], male   DOB: 12/31/1950, 68 y.o.   MRN: [id number]  I spoke with patient's niece Samuel Shelton on the phone.  I explained Samuel Shelton that due to patient's poor baseline health status he is not a candidate for outpatient dialysis and thereby do not recommend aggressive interventions. Samuel. Laurance Shelton wanted nephrology call and discuss with her again to explain her.  Dr. Candiss Shelton informed [name] with Dr Samuel Shelton on the call. Plan to give IV fluids, check labs in am and see how pt does.   Pt remains DNR

## 2020-01-14 NOTE — Care Management Important Message (Signed)
Important Message  Patient Details  Name: Samuel Shelton MRN: [id number] Date of Birth: 1951-06-25   Medicare Important Message Given:  Yes(Patient on isolation precautions. RN will take to patient next time she goes into the room.)     Candie Chroman, LCSW 01/14/2020, 3:43 PM

## 2020-01-15 DIAGNOSIS — G9341 Metabolic encephalopathy: Secondary | ICD-10-CM

## 2020-01-15 LAB — BASIC METABOLIC PANEL
Anion gap: 18 — ABNORMAL HIGH (ref 5–15)
BUN: 209 mg/dL — ABNORMAL HIGH (ref 8–23)
CO2: 19 mmol/L — ABNORMAL LOW (ref 22–32)
Calcium: 8 mg/dL — ABNORMAL LOW (ref 8.9–10.3)
Chloride: 112 mmol/L — ABNORMAL HIGH (ref 98–111)
Creatinine, Ser: 9.22 mg/dL — ABNORMAL HIGH (ref 0.61–1.24)
GFR calc Af Amer: 6 mL/min — ABNORMAL LOW (ref >60)
GFR calc non Af Amer: 5 mL/min — ABNORMAL LOW (ref >60)
Glucose, Bld: 144 mg/dL — ABNORMAL HIGH (ref 70–99)
Potassium: 3.4 mmol/L — ABNORMAL LOW (ref 3.5–5.1)
Sodium: 149 mmol/L — ABNORMAL HIGH (ref 135–145)

## 2020-01-15 LAB — COMPREHENSIVE METABOLIC PANEL
ALT: 31 U/L (ref 0–44)
AST: 45 U/L — ABNORMAL HIGH (ref 15–41)
Albumin: 2.3 g/dL — ABNORMAL LOW (ref 3.5–5.0)
Alkaline Phosphatase: 53 U/L (ref 38–126)
Anion gap: 18 — ABNORMAL HIGH (ref 5–15)
BUN: 226 mg/dL — ABNORMAL HIGH (ref 8–23)
CO2: 20 mmol/L — ABNORMAL LOW (ref 22–32)
Calcium: 8 mg/dL — ABNORMAL LOW (ref 8.9–10.3)
Chloride: 113 mmol/L — ABNORMAL HIGH (ref 98–111)
Creatinine, Ser: 9.4 mg/dL — ABNORMAL HIGH (ref 0.61–1.24)
GFR calc Af Amer: 6 mL/min — ABNORMAL LOW (ref >60)
GFR calc non Af Amer: 5 mL/min — ABNORMAL LOW (ref >60)
Glucose, Bld: 122 mg/dL — ABNORMAL HIGH (ref 70–99)
Potassium: 3.5 mmol/L (ref 3.5–5.1)
Sodium: 151 mmol/L — ABNORMAL HIGH (ref 135–145)
Total Bilirubin: 0.9 mg/dL (ref 0.3–1.2)
Total Protein: 6.5 g/dL (ref 6.5–8.1)

## 2020-01-15 MED ORDER — HYDROMORPHONE HCL 1 MG/ML IJ SOLN: 1.0000 mg | INTRAMUSCULAR | Status: DC | PRN

## 2020-01-15 NOTE — Progress Notes (Signed)
Updated by [facility] that patient is no longer on any COVID precautions, he will no longer require a COVID positive hospice home bed. [facility] is unable to offer a bed today. Also per MD family may have changed their minds about comfort focused end of life care at the hospice home. Patient remains on IV fluids and daily labs continue.  Will continue to follow and await clarification of families wishes from [facility] and attending MD.  Thank you. Flo Shanks BSN, RN [facility] [phone]

## 2020-01-15 NOTE — Evaluation (Signed)
Clinical/Bedside Swallow Evaluation Patient Details  Name: Samuel Shelton MRN: [id number] Date of Birth: 02-Apr-1951  Today's Date: 01/15/2020 Time: SLP Start Time (ACUTE ONLY): 30 SLP Stop Time (ACUTE ONLY): 1120 SLP Time Calculation (min) (ACUTE ONLY): 55 min  Past Medical History:  Past Medical History:  Diagnosis Date  . Chronic mental illness   . Edema of both legs   . Elevated PSA   . Elevated PSA   . Hypertension   . Mental retardation   . Phimosis    Past Surgical History:  Past Surgical History:  Procedure Laterality Date  . PROSTATE BIOPSY N/A 06/22/2015   Procedure: PROSTATE BIOPSY;  Surgeon: Hollice Espy, MD;  Location: [facility] ORS;  Service: Urology;  Laterality: N/A;  . PROSTATE BIOPSY N/A 09/27/2016   Procedure: PROSTATE BIOPSY;  Surgeon: Hollice Espy, MD;  Location: [facility] ORS;  Service: Urology;  Laterality: N/A;   HPI:  Pt is a 69 y.o. male with medical history significant of mental retardation lives in a group home, hypertension, hyperlipidemia, stroke, BPH, who presents with altered mental status. Per report, pt was noted to have worsening mental status recently and has decreased oral intake and decreased responsiveness than baseline. Pt opens his eyes spontaneously, and was moving face mask off his face. Pt had positive Covid 19 test on 12/13/19. Per EMS, pt was noted to have oxygen sat 65% on room air. On arrival pt on face mask at 15L oxygen sat 96%, but then oxygen saturation improved to 94-99% on room air in ED. Pt admitted w/ Dehydration and Hypernatremia: Na 170.  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  CXR: "Prominent bilateral interstitial lung markings which could reflect edema versus atypical/viral infection"; Head CT: "Mild diffuse cortical atrophy. Mild chronic ischemic white matter disease. No acute intracranial abnormality seen".  Pt had a  previous BSE in 01/2019 w/ noted decreased R facial tone.   Assessment / Plan / Recommendation Clinical Impression  This is a Re-assessment of pt's swallowing function. MD requested a repeat BSE now that pt has transferred out of the CCU. Pt is awake; he looks to this SLP and NSG intermittently but not eager to participate in taking po trials, oral care. Pt nodded x1 that he felt "ok" and shook his head "no" if he had any pain but otherwise Nonverbal. Upon initiating the BSE, noted pt had significant dried oral/lingual secretions. Attempted oral care to remove this, but pt often turned his head away - some removed. NSG present and will continue to try to clean mouth throughout the day.  W/ MUCH tactile/verbal/visual cueing, pt accepted 3 small TSP trials of thin liquids, ice chip. Significant oral apprehension about taking the po trials was noted -- unsure if d/t his mouth or Comprehension. Oral phase deficits noted c/b decreased mouth opening, poor stripping of utensil, oral holding, anterior leakage intermittently. A delayed pharyngeal swallow response noted x2/3 trials of thin liquids. No overt coughing noted; no decline in respiratory status noted w/ the few trials. Upon further attempts at water, thickened liquids and puree, pt turned his head away and/or kept lips tightly closed. SLP did not push pt further to take po trials. NSG present for much of the session and to see pt's mouth status.  Pt appears to present w/ oropharyngeal phase dysphagia significantly impacted by his declined Cognitive/Mental status resulting in increased risk for choking/aspiration w/ any oral  intake currently. Pt exhibited declined attention/participation for po tasks and reduced oral awareness of stimulation of bolus material placed orally in mouth. Oral phase deficits noted at first evaluation. There is concern for pharyngeal phase deficits w/ current presentation.  Recommend continue NPO status at this time w/ Frequent oral  care for hygiene and stimulation of swallowing; aspiration precautions. Pt's presentation is not safe for oral intake nor will he be able to meet his nutritional needs orally -- this was discussed by MD in order to talk w/ family re: alternative means of feeding (NG) option(pt has been NPO since admission). ST services will f/u next 1-2 days w/ ongoing assessment of pt's status and swallow function and safety w/ oral intake for initiation of oral diet. MD/NSG updated.  SLP Visit Diagnosis: Dysphagia, oropharyngeal phase (R13.12)(significant mental status decline; refusal of po's)    Aspiration Risk  Moderate aspiration risk;Severe aspiration risk;Risk for inadequate nutrition/hydration    Diet Recommendation  NPO status w/ frequent oral care by [facility] staff for hygiene and stimulation of swallowing; aspiration precautions.  Medication Administration: Via alternative means    Other  Recommendations Recommended Consults: (Palliative Care services; Dietician f/u) Oral Care Recommendations: Oral care QID;Staff/trained caregiver to provide oral care(aspiration precautions) Other Recommendations: (TBD)   Follow up Recommendations Skilled Nursing facility(TBD)      Frequency and Duration min 3x week  2 weeks       Prognosis Prognosis for Safe Diet Advancement: Guarded Barriers to Reach Goals: Cognitive deficits;Time post onset;Severity of deficits;Behavior      Swallow Study   General Date of Onset: 01/08/20 HPI: Pt is a 69 y.o. male with medical history significant of mental retardation lives in a group home, hypertension, hyperlipidemia, stroke, BPH, who presents with altered mental status. Per report, pt was noted to have worsening mental status recently and has decreased oral intake and decreased responsiveness than baseline. Pt opens his eyes spontaneously, and was moving face mask off his face. Pt had positive Covid 19 test on 12/13/19. Per EMS, pt was noted to have oxygen sat 65% on room  air. On arrival pt on face mask at 15L oxygen sat 96%, but then oxygen saturation improved to 94-99% on room air in ED. Pt admitted w/ Dehydration and Hypernatremia: Na 170.  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  CXR: "Prominent bilateral interstitial lung markings which could reflect edema versus atypical/viral infection"; Head CT: "Mild diffuse cortical atrophy. Mild chronic ischemic white matter disease. No acute intracranial abnormality seen".  Pt had a previous BSE in 01/2019 w/ noted decreased R facial tone. Type of Study: Bedside Swallow Evaluation Previous Swallow Assessment: 01/2019 - cva then; 01/09/2020 Diet Prior to this Study: NPO Temperature Spikes Noted: No(wbc 8.4) Respiratory Status: Room air History of Recent Intubation: No Behavior/Cognition: Pleasant mood;Confused;Distractible;Requires cueing;Doesn't follow directions(Awake; often not wanting to open mouth) Oral Cavity Assessment: Dried secretions(Significant; coated tongue and lips) Oral Care Completed by SLP: Yes(pt would not allow SLP to attempt to clear/clean mouth ) Oral Cavity - Dentition: Adequate natural dentition;Missing dentition Vision: (n/a) Self-Feeding Abilities: Total assist Patient Positioning: Upright in bed(needed positioning) Baseline Vocal Quality: (nonverbal) Volitional Cough: Cognitively unable to elicit Volitional Swallow: Unable to elicit    Oral/Motor/Sensory Function Overall Oral Motor/Sensory Function: Generalized oral weakness(decreased R facial/labial tone) Facial ROM: Reduced right Facial Symmetry: Abnormal symmetry right Facial Strength: Reduced right Lingual Symmetry: Within  Functional Limits   Ice Chips Ice chips: Impaired Presentation: Spoon(1 trial) Oral Phase Impairments: Poor awareness of bolus;Reduced lingual movement/coordination Oral Phase Functional Implications: Oral  holding Pharyngeal Phase Impairments: (no pharyngeal swallow)   Thin Liquid Thin Liquid: Impaired Oral Phase Impairments: Reduced labial seal;Reduced lingual movement/coordination;Poor awareness of bolus(3 trials) Oral Phase Functional Implications: Prolonged oral transit(anterior leakage) Pharyngeal  Phase Impairments: Suspected delayed Swallow Other Comments: turned head away often despite encouragement    Nectar Thick Nectar Thick Liquid: Not tested   Honey Thick Honey Thick Liquid: Not tested   Puree Puree: Not tested   Solid     Solid: Not tested       Orinda Kenner, MS, CCC-SLP Ellese Julius 01/15/2020,11:43 AM

## 2020-01-15 NOTE — Progress Notes (Signed)
[facility] [place], [place] 01/15/20  Subjective:   Hospital day # 7 Patient remains critically ill cvs: Not on any pressors pulm: Currently on room air Appears more alert , able to follow simple commands Renal: Critically elevated BUN and creatinine 01/19 0701 - 01/20 0700 In: 405.6 [I.V.:405.6] Out: 1425 [Urine:1425] Lab Results  Component Value Date   CREATININE 9.40 (H) 01/15/2020   CREATININE 7.06 (H) 01/11/2020   CREATININE 6.40 (H) 01/10/2020     Objective:  Vital signs in last 24 hours:  Temp:  [97.5 F (36.4 C)-98.4 F (36.9 C)] 98.4 F (36.9 C) (01/20 0536) Pulse Rate:  [64-75] 75 (01/20 0536) Resp:  [18-20] 20 (01/20 0536) BP: (142-156)/(81-99) 156/94 (01/20 0536) SpO2:  [98 %-99 %] 98 % (01/20 0536)  Weight change:  Filed Weights   01/08/20 0950 01/11/20 0108  Weight: 85.7 kg 79.2 kg    Intake/Output:    Intake/Output Summary (Last 24 hours) at 01/15/2020 0945 Last data filed at 01/15/2020 0616 Gross per 24 hour  Intake 146.31 ml  Output 1000 ml  Net -853.69 ml     Physical Exam: General:  No acute distress, chronically ill-appearing, cachectic  HEENT  dry oral mucous membranes  Pulm/lungs  room air, normal breathing effort  CVS/Heart  regular rhythm  Abdomen:   Soft, nontender  Extremities:  No peripheral edema  Neurologic:  alert, following commands  Skin:  Warm  Foley catheter in place    Basic Metabolic Panel:  Recent Labs  Lab 01/09/20 1840 01/09/20 1840 01/10/20 0048 01/10/20 0048 01/10/20 2111 01/11/20 0716 01/15/20 0550  NA 164*  --  159*  --  154* 151* 151*  K 4.6  --  3.8  --  4.2 4.1 3.5  CL 124*  --  122*  --  116* 109 113*  CO2 27  --  21*  --  19* 23 20*  GLUCOSE 155*  --  170*  --  202* 154* 122*  BUN 144*  --  156*  --  163* 183* 226*  CREATININE 3.09*  --  3.64*  --  6.40* 7.06* 9.40*  CALCIUM 9.6   < > 9.2   < > 8.8* 8.6* 8.0*  MG  --   --   --   --  4.4* 3.9*  --   PHOS  --   --   --    --   --  9.1*  --    < > = values in this interval not displayed.     CBC: Recent Labs  Lab 01/08/20 1118 01/09/20 0139  WBC 8.6 8.4  NEUTROABS 6.2  --   HGB 19.8* 18.5*  HCT 68.1* 62.8*  MCV 95.8 94.7  PLT 160 165     No results found for: HEPBSAG, HEPBSAB, HEPBIGM    Microbiology:  Recent Results (from the past 240 hour(s))  MRSA PCR Screening     Status: None   Collection Time: 01/08/20  5:10 PM   Specimen: Urine, Clean Catch; Nasopharyngeal  Result Value Ref Range Status   MRSA by PCR NEGATIVE NEGATIVE Final    Comment:        The GeneXpert MRSA Assay (FDA approved for NASAL specimens only), is one component of a comprehensive MRSA colonization surveillance program. It is not intended to diagnose MRSA infection nor to guide or monitor treatment for MRSA infections. Performed at [facility], [address] [place], [place] [address]   MRSA PCR Screening  Status: None   Collection Time: 01/11/20  2:16 AM   Specimen: Nasopharyngeal  Result Value Ref Range Status   MRSA by PCR NEGATIVE NEGATIVE Final    Comment:        The GeneXpert MRSA Assay (FDA approved for NASAL specimens only), is one component of a comprehensive MRSA colonization surveillance program. It is not intended to diagnose MRSA infection nor to guide or monitor treatment for MRSA infections. Performed at [facility], [address] [place], [place] [address]   Respiratory Panel by RT PCR (Flu A&B, Covid) - Nasopharyngeal Swab     Status: Abnormal   Collection Time: 01/11/20  2:12 PM   Specimen: Nasopharyngeal Swab  Result Value Ref Range Status   SARS Coronavirus 2 by RT PCR POSITIVE (A) NEGATIVE Final    Comment: RESULT CALLED TO, READ BACK BY AND VERIFIED WITH: [facility] SMITH AT [id number] 01/11/20.PMF (NOTE) SARS-CoV-2 target nucleic acids are DETECTED. SARS-CoV-2 RNA is generally detectable in upper respiratory specimens  during the acute phase of infection.  Positive results are indicative of the presence of the identified virus, but do not rule out bacterial infection or co-infection with other pathogens not detected by the test. Clinical correlation with patient history and other diagnostic information is necessary to determine patient infection status. The expected result is Negative. Fact Sheet for Patients:  PinkCheek.be Fact Sheet for Healthcare Providers: GravelBags.it This test is not yet approved or cleared by the [place] FDA and  has been authorized for detection and/or diagnosis of SARS-CoV-2 by FDA under an Emergency Use Authorization (EUA).  This EUA will remain in effect (meaning this test can be used) for  the duration of  the COVID-19 declaration under Section 564(b)(1) of the Act, 21 U.S.C. section 360bbb-3(b)(1), unless the authorization is terminated or revoked sooner.    Influenza A by PCR NEGATIVE NEGATIVE Final   Influenza B by PCR NEGATIVE NEGATIVE Final    Comment: (NOTE) The Xpert Xpress SARS-CoV-2/FLU/RSV assay is intended as an aid in  the diagnosis of influenza from Nasopharyngeal swab specimens and  should not be used as a sole basis for treatment. Nasal washings and  aspirates are unacceptable for Xpert Xpress SARS-CoV-2/FLU/RSV  testing. Fact Sheet for Patients: PinkCheek.be Fact Sheet for Healthcare Providers: GravelBags.it This test is not yet approved or cleared by the [place] FDA and  has been authorized for detection and/or diagnosis of SARS-CoV-2 by  FDA under an Emergency Use Authorization (EUA). This EUA will remain  in effect (meaning this test can be used) for the duration of the  Covid-19 declaration under Section 564(b)(1) of the Act, 21  U.S.C. section 360bbb-3(b)(1), unless the authorization is  terminated or revoked. Performed at [facility], [address] [place], [place] [address]     Coagulation Studies: No results for input(s): LABPROT, INR in the last 72 hours.  Urinalysis: No results for input(s): COLORURINE, LABSPEC, PHURINE, GLUCOSEU, HGBUR, BILIRUBINUR, KETONESUR, PROTEINUR, UROBILINOGEN, NITRITE, LEUKOCYTESUR in the last 72 hours.  Invalid input(s): APPERANCEUR    Imaging: No results found.   Medications:   . dextrose Stopped (01/14/20 1701)   . Chlorhexidine Gluconate Cloth  6 each Topical Daily  . mouth rinse  15 mL Mouth Rinse q12n4p   acetaminophen **OR** acetaminophen, ondansetron (ZOFRAN) IV  Assessment/ Plan:  69 y.o. male with history of mental retardation, hypertension, hyperlipidemia, stroke 2019, BPH, resident of [facility] Covid virus infection 12/ 2019 admitted on 01/08/2020 for  Acute kidney failure, unspecified ([place]) [N17.9] Hypernatremia [E87.0] Altered mental status, unspecified altered mental status type [R41.82]  #Acute renal failure Baseline creatinine of 0.95 in February 2020 Admission creatinine of 3.15, BUN 146 Progressively serum creatinine has increased to 9.40 with BUN of 226 Phosphorus of 9.1 AKI likely secondary to ATN from Severe dehydration.  Recent history of COVID-19 infection in December 2020  Renal U/S neg for obstruction Conservative management for now with iv fluids UOP is improving.  Monitor BMP daily  #Severe hypernatremia secondary to dehydration patient presented with sodium of 170 D5W at 75 cc/hr--> increase to 100 cc / hr Repeat BMP later today Follow labs closely  Patient's niece - Ms [name] [phone]   LOS: [place] 1/20/20219:45 [place], [place]  Note: This note was prepared with Dragon dictation. Any transcription errors are unintentional

## 2020-01-15 NOTE — Progress Notes (Signed)
[facility] at [place] NAME: Arshawn Norred    MR#:  [id number]  DATE OF BIRTH:  1951/05/15  SUBJECTIVE:  watching TV, alert , able to follow simple verbal commands REVIEW OF SYSTEMS:   Review of Systems  Unable to perform ROS: Medical condition  remains confused DRUG ALLERGIES:  No Known Allergies  VITALS:  Blood pressure 119/74, pulse 69, temperature 98.4 F (36.9 C), temperature source Oral, resp. rate 16, height 6' (1.829 m), weight 79.2 kg, SpO2 95 %.  PHYSICAL EXAMINATION:   Physical Exam-- limited exam due pt unable to participate  GENERAL:  69 y.o.-year-old patient lying in the bed with no acute distress.  Appears severely dehydrated LUNGS: Normal breath sounds bilaterally, no wheezing No use of accessory muscles of respiration.  CARDIOVASCULAR: S1, S2 normal.  NEUROLOGIC: alert, confused. Follows simple verbal commands SKIN: No obvious rash, lesion, or ulcer-- per RN  LABORATORY PANEL:  CBC Recent Labs  Lab 01/09/20 0139  WBC 8.4  HGB 18.5*  HCT 62.8*  PLT 165    Chemistries  Recent Labs  Lab 01/11/20 0716 01/11/20 0716 01/15/20 0550 01/15/20 0550 01/15/20 1504  NA 151*   < > 151*   < > 149*  K 4.1   < > 3.5   < > 3.4*  CL 109   < > 113*   < > 112*  CO2 23   < > 20*   < > 19*  GLUCOSE 154*   < > 122*   < > 144*  BUN 183*   < > 226*   < > 209*  CREATININE 7.06*   < > 9.40*   < > 9.22*  CALCIUM 8.6*   < > 8.0*   < > 8.0*  MG 3.9*  --   --   --   --   AST 43*   < > 45*  --   --   ALT 24   < > 31  --   --   ALKPHOS 72   < > 53  --   --   BILITOT 1.5*   < > 0.9  --   --    < > = values in this interval not displayed.   Cardiac Enzymes No results for input(s): TROPONINI in the last 168 hours. RADIOLOGY:  No results found. ASSESSMENT AND PLAN:  Siddarth Yerby is a 69 y.o. male with medical history significant of mental retardation, hypertension, hyperlipidemia, stroke, BPH, who presents with altered  mental status.  Severe Hypernatremia: Na 170.   -ultrasound renal essentially nothing acute - Serum Sodium 170--170--171--159--154--151->149 - slowly improving with IVFs - Nephro has her on D5W at 75 cc/hr--> increase to 100 cc / hr  AKI with ATN secondary to severe dehydration - S creatinine worsening 2.96--3.6--6.4--7.06->9.22 --US-renal --> unremarkable finding -Nephrology following. No plan for HD but conservative mgmt as he is now making urine and kidney function might start improving slowly  Chronic mental disorder and acute metabolic encephalopathy, uremic -CT head is negative for acute intracranial abnormalities, likely due to electrolytes disturbance and worsening renal function  Hypercalcemia  -IVF  Recent COVID-19 virus infection: Patient had a positive COVID-19 on 12/18, no fever.   Guarded prognosis.  Family communication :  niece Ms. Donna Christen  patient's [facility] POA - in agreement with current mgmt Consults : nephrology, palliative care  discharge Disposition : may be home with Palliative/Hospice depending on clinical condition/progress CODE STATUS:DNR DVT Prophylaxis  scd  TOTAL TIME TAKING CARE OF THIS PATIENT: *15* minutes.  >50% time spent on counselling and coordination of care  Note: This dictation was prepared with Dragon dictation along with smaller phrase technology. Any transcriptional errors that result from this process are unintentional.  Max Sane M.D on 01/15/2020 at 9:40 PM  [facility]   CC: Primary care physician; Sofie Hartigan, MDPatient ID: [name], male   DOB: 1951-10-14, 69 y.o.   MRN: [id number]

## 2020-01-15 NOTE — TOC Progression Note (Signed)
Transition of Care [facility]) - Progression Note    Patient Details  Name: Samuel Shelton MRN: [id number] Date of Birth: 01/28/1951  [facility]) CM/SW Contact  Beverly Sessions, RN Phone Number: 01/15/2020, 3:00 PM  Clinical Narrative:      No hospice home beds      Expected Discharge Plan and Services                                                 Social Determinants of Health (SDOH) Interventions    Readmission Risk Interventions No flowsheet data found.

## 2020-01-16 LAB — RENAL FUNCTION PANEL
Albumin: 2.3 g/dL — ABNORMAL LOW (ref 3.5–5.0)
Anion gap: 19 — ABNORMAL HIGH (ref 5–15)
BUN: 192 mg/dL — ABNORMAL HIGH (ref 8–23)
CO2: 21 mmol/L — ABNORMAL LOW (ref 22–32)
Calcium: 8.1 mg/dL — ABNORMAL LOW (ref 8.9–10.3)
Chloride: 112 mmol/L — ABNORMAL HIGH (ref 98–111)
Creatinine, Ser: 9.13 mg/dL — ABNORMAL HIGH (ref 0.61–1.24)
GFR calc Af Amer: 6 mL/min — ABNORMAL LOW (ref >60)
GFR calc non Af Amer: 5 mL/min — ABNORMAL LOW (ref >60)
Glucose, Bld: 132 mg/dL — ABNORMAL HIGH (ref 70–99)
Phosphorus: 9.2 mg/dL — ABNORMAL HIGH (ref 2.5–4.6)
Potassium: 3.2 mmol/L — ABNORMAL LOW (ref 3.5–5.1)
Sodium: 152 mmol/L — ABNORMAL HIGH (ref 135–145)

## 2020-01-16 NOTE — Progress Notes (Signed)
[place] [place] at [place] NAME: Samuel Shelton    MR#:  [id number]  DATE OF BIRTH:  Feb 23, 1951  SUBJECTIVE:  About same, food in mouth pocketing REVIEW OF SYSTEMS:   Review of Systems  Unable to perform ROS: Medical condition  remains confused DRUG ALLERGIES:  No Known Allergies  VITALS:  Blood pressure (!) 185/92, pulse 80, temperature 98.5 F (36.9 C), temperature source Oral, resp. rate 16, height 6' (1.829 m), weight 79.2 kg, SpO2 95 %.  PHYSICAL EXAMINATION:   Physical Exam-- limited exam due pt unable to participate  GENERAL:  69 y.o.-year-old patient lying in the bed with no acute distress.  Appears severely dehydrated LUNGS: Normal breath sounds bilaterally, no wheezing No use of accessory muscles of respiration.  CARDIOVASCULAR: S1, S2 normal.  NEUROLOGIC: alert, confused. Follows simple verbal commands SKIN: No obvious rash, lesion, or ulcer-- per RN  LABORATORY PANEL:  CBC No results for input(s): WBC, HGB, HCT, PLT in the last 168 hours.  Chemistries  Recent Labs  Lab 01/11/20 0716 01/11/20 0716 01/15/20 0550 01/15/20 1504 01/16/20 0326  NA 151*   < > 151*   < > 152*  K 4.1   < > 3.5   < > 3.2*  CL 109   < > 113*   < > 112*  CO2 23   < > 20*   < > 21*  GLUCOSE 154*   < > 122*   < > 132*  BUN 183*   < > 226*   < > 192*  CREATININE 7.06*   < > 9.40*   < > 9.13*  CALCIUM 8.6*   < > 8.0*   < > 8.1*  MG 3.9*  --   --   --   --   AST 43*   < > 45*  --   --   ALT 24   < > 31  --   --   ALKPHOS 72   < > 53  --   --   BILITOT 1.5*   < > 0.9  --   --    < > = values in this interval not displayed.   Cardiac Enzymes No results for input(s): TROPONINI in the last 168 hours. RADIOLOGY:  No results found. ASSESSMENT AND PLAN:  Samuel Shelton is a 69 y.o. male with medical history significant of mental retardation, hypertension, hyperlipidemia, stroke, BPH, who presents with altered mental status.  Severe  Hypernatremia: Na 170.   -ultrasound renal essentially nothing acute - Serum Sodium 170->152 - on D5W @ 100 cc / hr per nephro  AKI with ATN secondary to severe dehydration - S creatinine worsening 2.96->9.1 --US-renal --> unremarkable finding -Nephrology following. No plan for HD but conservative mgmt as he continues to make urine and kidney function slowly improving  Chronic mental disorder and acute metabolic encephalopathy, uremic -CT head is negative for acute intracranial abnormalities, likely due to electrolytes disturbance and worsening renal function  Hypercalcemia  resolved  Recent COVID-19 virus infection: Patient had a positive COVID-19 on 12/18, no fever.   Guarded prognosis.  Family communication : Updated Patient's niece - Ms [name] [phone] who is in agreement with current mgmt Consults : nephrology, palliative care  discharge Disposition : Niece would like him to go different SNF than [facility] with PC when he's medically stable.  CODE STATUS:DNR DVT Prophylaxis scd  TOTAL TIME TAKING CARE OF THIS PATIENT: *35* minutes.  >  50% time spent on counselling and coordination of care  Note: This dictation was prepared with Dragon dictation along with smaller phrase technology. Any transcriptional errors that result from this process are unintentional.  Max Sane M.D on 01/16/2020 at 9:03 PM  [facility]   CC: Primary care physician; Sofie Hartigan, MDPatient ID: Samuel Shelton, male   DOB: [birth year], 69 y.o.   MRN: [id number]

## 2020-01-16 NOTE — Progress Notes (Signed)
[facility], [place] 01/16/20  Subjective:   Hospital day # 8 Patient remains critically ill cvs: Not on any pressors pulm: Currently on room air Appears more alert , able to follow simple commands Speech and language pathologist evluated patient  Renal: Critically elevated BUN and creatinine 01/20 0701 - 01/21 0700 In: 550 [I.V.:550] Out: 1275 [Urine:1275] Lab Results  Component Value Date   CREATININE 9.13 (H) 01/16/2020   CREATININE 9.22 (H) 01/15/2020   CREATININE 9.40 (H) 01/15/2020     Objective:  Vital signs in last 24 hours:  Temp:  [98.1 F (36.7 C)-98.4 F (36.9 C)] 98.2 F (36.8 C) (01/21 1412) Pulse Rate:  [55-70] 55 (01/21 1412) Resp:  [16] 16 (01/21 1412) BP: (119-134)/(72-78) 125/72 (01/21 1412) SpO2:  [95 %-98 %] 98 % (01/21 1412)  Weight change:  Filed Weights   01/08/20 0950 01/11/20 0108  Weight: 85.7 kg 79.2 kg    Intake/Output:    Intake/Output Summary (Last 24 hours) at 01/16/2020 1708 Last data filed at 01/16/2020 1500 Gross per 24 hour  Intake 1210.19 ml  Output 1000 ml  Net 210.19 ml     Physical Exam: General:  No acute distress, chronically ill-appearing, cachectic  HEENT  dry oral mucous membranes  Pulm/lungs  room air, normal breathing effort  CVS/Heart  regular rhythm  Abdomen:   Soft, nontender  Extremities:  No peripheral edema  Neurologic:  alert, following commands  Skin:  Warm  Foley catheter in place    Basic Metabolic Panel:  Recent Labs  Lab 01/10/20 2111 01/10/20 2111 01/11/20 0716 01/11/20 0716 01/15/20 0550 01/15/20 1504 01/16/20 0326  NA 154*  --  151*  --  151* 149* 152*  K 4.2  --  4.1  --  3.5 3.4* 3.2*  CL 116*  --  109  --  113* 112* 112*  CO2 19*  --  23  --  20* 19* 21*  GLUCOSE 202*  --  154*  --  122* 144* 132*  BUN 163*  --  183*  --  226* 209* 192*  CREATININE 6.40*  --  7.06*  --  9.40* 9.22* 9.13*  CALCIUM 8.8*   < > 8.6*   < > 8.0* 8.0* 8.1*  MG 4.4*  --   3.9*  --   --   --   --   PHOS  --   --  9.1*  --   --   --  9.2*   < > = values in this interval not displayed.     CBC: No results for input(s): WBC, NEUTROABS, HGB, HCT, MCV, PLT in the last 168 hours.   No results found for: HEPBSAG, HEPBSAB, HEPBIGM    Microbiology:  Recent Results (from the past 240 hour(s))  MRSA PCR Screening     Status: None   Collection Time: 01/08/20  5:10 PM   Specimen: Urine, Clean Catch; Nasopharyngeal  Result Value Ref Range Status   MRSA by PCR NEGATIVE NEGATIVE Final    Comment:        The GeneXpert MRSA Assay (FDA approved for NASAL specimens only), is one component of a comprehensive MRSA colonization surveillance program. It is not intended to diagnose MRSA infection nor to guide or monitor treatment for MRSA infections. Performed at [facility], [address] [place], [place] [address]   MRSA PCR Screening     Status: None   Collection Time: 01/11/20  2:16 AM   Specimen:  Nasopharyngeal  Result Value Ref Range Status   MRSA by PCR NEGATIVE NEGATIVE Final    Comment:        The GeneXpert MRSA Assay (FDA approved for NASAL specimens only), is one component of a comprehensive MRSA colonization surveillance program. It is not intended to diagnose MRSA infection nor to guide or monitor treatment for MRSA infections. Performed at [facility], [address] [place], [place] [address]   Respiratory Panel by RT PCR (Flu A&B, Covid) - Nasopharyngeal Swab     Status: Abnormal   Collection Time: 01/11/20  2:12 PM   Specimen: Nasopharyngeal Swab  Result Value Ref Range Status   SARS Coronavirus 2 by RT PCR POSITIVE (A) NEGATIVE Final    Comment: RESULT CALLED TO, READ BACK BY AND VERIFIED WITH: [facility] SMITH AT [id number] 01/11/20.PMF (NOTE) SARS-CoV-2 target nucleic acids are DETECTED. SARS-CoV-2 RNA is generally detectable in upper respiratory specimens  during the acute phase of infection. Positive results are  indicative of the presence of the identified virus, but do not rule out bacterial infection or co-infection with other pathogens not detected by the test. Clinical correlation with patient history and other diagnostic information is necessary to determine patient infection status. The expected result is Negative. Fact Sheet for Patients:  PinkCheek.be Fact Sheet for Healthcare Providers: GravelBags.it This test is not yet approved or cleared by the [place] FDA and  has been authorized for detection and/or diagnosis of SARS-CoV-2 by FDA under an Emergency Use Authorization (EUA).  This EUA will remain in effect (meaning this test can be used) for  the duration of  the COVID-19 declaration under Section 564(b)(1) of the Act, 21 U.S.C. section 360bbb-3(b)(1), unless the authorization is terminated or revoked sooner.    Influenza A by PCR NEGATIVE NEGATIVE Final   Influenza B by PCR NEGATIVE NEGATIVE Final    Comment: (NOTE) The Xpert Xpress SARS-CoV-2/FLU/RSV assay is intended as an aid in  the diagnosis of influenza from Nasopharyngeal swab specimens and  should not be used as a sole basis for treatment. Nasal washings and  aspirates are unacceptable for Xpert Xpress SARS-CoV-2/FLU/RSV  testing. Fact Sheet for Patients: PinkCheek.be Fact Sheet for Healthcare Providers: GravelBags.it This test is not yet approved or cleared by the [place] FDA and  has been authorized for detection and/or diagnosis of SARS-CoV-2 by  FDA under an Emergency Use Authorization (EUA). This EUA will remain  in effect (meaning this test can be used) for the duration of the  Covid-19 declaration under Section 564(b)(1) of the Act, 21  U.S.C. section 360bbb-3(b)(1), unless the authorization is  terminated or revoked. Performed at [facility], [address]  [place], [place] [address]     Coagulation Studies: No results for input(s): LABPROT, INR in the last 72 hours.  Urinalysis: No results for input(s): COLORURINE, LABSPEC, PHURINE, GLUCOSEU, HGBUR, BILIRUBINUR, KETONESUR, PROTEINUR, UROBILINOGEN, NITRITE, LEUKOCYTESUR in the last 72 hours.  Invalid input(s): APPERANCEUR    Imaging: No results found.   Medications:   . dextrose 100 mL/hr at 01/16/20 1500   . Chlorhexidine Gluconate Cloth  6 each Topical Daily  . mouth rinse  15 mL Mouth Rinse q12n4p   acetaminophen **OR** acetaminophen, HYDROmorphone (DILAUDID) injection, ondansetron (ZOFRAN) IV  Assessment/ Plan:  69 y.o. male with history of mental retardation, hypertension, hyperlipidemia, stroke 2019, BPH, resident of [place] home Covid virus infection 12/ 2019 admitted on 01/08/2020 for Acute kidney failure, unspecified ([place]) [N17.9] Hypernatremia [id number]]  Altered mental status, unspecified altered mental status type [R41.82]  #Acute renal failure Baseline creatinine of 0.95 in February 2020 Admission creatinine of 3.15, BUN 146 Progressively serum creatinine has increased to 9.40 with BUN of 226 Phosphorus of 9.1 AKI likely secondary to ATN from Severe dehydration.  Recent history of COVID-19 infection in December 2020 Renal U/S neg for obstruction Conservative management for now with iv fluids UOP is improving.  Monitor BMP daily  #Severe hypernatremia secondary to dehydration patient presented with sodium of 170 D5W at 75 cc/hr--> increase to 100 cc / hr Follow labs closely Inability to take PO is a significant issue  Patient's niece - Ms [name] [phone]   LOS: [place] 1/21/20215:08 PM  [place], [place]  Note: This note was prepared with Dragon dictation. Any transcription errors are unintentional

## 2020-01-16 NOTE — Progress Notes (Signed)
Patient is refusing all oral care. He will only allow RN to put gel on lips.

## 2020-01-16 NOTE — Progress Notes (Signed)
Chart reviewed. Pt visited in hopes of giving trial therapeautic PO's. Pt was awake and followed ST with eyes. Attempted a few times to try and get Pt to open for a very small amount of Po's. Each time he was presented with a spoon or a cup or piece of ice, he turned his head away and tightened his lips. Will follow up through nursing of any changes that may warrent further assessment. At this time it appears that Pt has no desire for food or liquid.

## 2020-01-17 LAB — BASIC METABOLIC PANEL
Anion gap: 19 — ABNORMAL HIGH (ref 5–15)
BUN: 191 mg/dL — ABNORMAL HIGH (ref 8–23)
CO2: 21 mmol/L — ABNORMAL LOW (ref 22–32)
Calcium: 8.3 mg/dL — ABNORMAL LOW (ref 8.9–10.3)
Chloride: 108 mmol/L (ref 98–111)
Creatinine, Ser: 8.29 mg/dL — ABNORMAL HIGH (ref 0.61–1.24)
GFR calc Af Amer: 7 mL/min — ABNORMAL LOW (ref >60)
GFR calc non Af Amer: 6 mL/min — ABNORMAL LOW (ref >60)
Glucose, Bld: 127 mg/dL — ABNORMAL HIGH (ref 70–99)
Potassium: 3 mmol/L — ABNORMAL LOW (ref 3.5–5.1)
Sodium: 148 mmol/L — ABNORMAL HIGH (ref 135–145)

## 2020-01-17 LAB — CBC
HCT: 35.9 % — ABNORMAL LOW (ref 39.0–52.0)
Hemoglobin: 11.4 g/dL — ABNORMAL LOW (ref 13.0–17.0)
MCH: 27.7 pg (ref 26.0–34.0)
MCHC: 31.8 g/dL (ref 30.0–36.0)
MCV: 87.3 fL (ref 80.0–100.0)
Platelets: 98 10*3/uL — ABNORMAL LOW (ref 150–400)
RBC: 4.11 MIL/uL — ABNORMAL LOW (ref 4.22–5.81)
RDW: 15.2 % (ref 11.5–15.5)
WBC: 6.5 10*3/uL (ref 4.0–10.5)
nRBC: 0 % (ref 0.0–0.2)

## 2020-01-17 LAB — MAGNESIUM: Magnesium: 2.9 mg/dL — ABNORMAL HIGH (ref 1.7–2.4)

## 2020-01-17 MED ORDER — POTASSIUM CL IN DEXTROSE 5% 20 MEQ/L IV SOLN
20.0000 meq | INTRAVENOUS | Status: DC
  Administered 2020-01-17 – 2020-01-22 (×9): 20 meq via INTRAVENOUS
  Filled 2020-01-17 (×10): qty 1000

## 2020-01-17 MED ORDER — POTASSIUM CHLORIDE CRYS ER 20 MEQ PO TBCR: 40.0000 meq | EXTENDED_RELEASE_TABLET | Freq: Once | ORAL | Status: DC

## 2020-01-17 NOTE — Progress Notes (Signed)
OT Cancellation Note  Patient Details Name: Samuel Shelton MRN: [id number] DOB: 1951/11/06   Cancelled Treatment:    Reason Eval/Treat Not Completed: Fatigue/lethargy limiting ability to participate  OT consult received and chart reviewed. Upon OT presenting to room, pt sleeping. OT attempts to rouse with tactile and auditory stimuli as well as raising HOB. Pt opens eyes some, but does not sustain eye opening/attention for participation in any meaningful assessment. Will f/u for OT evaluation attempt on next available date. Thank you.  Gerrianne Scale, [place], OTR/L ascom [phone] 01/17/20, 4:14 PM

## 2020-01-17 NOTE — TOC Progression Note (Signed)
Transition of Care [facility]) - Progression Note    Patient Details  Name: Samuel Shelton MRN: [id number] Date of Birth: [birth year]  Transition of [facility]) CM/SW Contact  Beverly Sessions, RN Phone Number: 01/17/2020, 1:22 PM  Clinical Narrative:      Spoke with MD and niece to confirm plan of care. PT has assessed patient and recommends SNF Plan to DC to SNF with outpatient Palliative, then transition to long term care  Existing PASRR Fl2 sent for signature Bed Search initiated - requests to to be sent to [facility] as they do not wish to return  Outpatient palliative referral made to Santiago Glad with [facility]    Patient currently NPO.  RNCM reached out to MD to determine what his means of nutrition will be once he discharges.         Expected Discharge Plan and Services                                                 Social Determinants of Health (SDOH) Interventions    Readmission Risk Interventions No flowsheet data found.

## 2020-01-17 NOTE — Progress Notes (Signed)
[place] [facility] [place] NAME: Samuel Shelton    MR#:  [id number]  DATE OF BIRTH:  June 24, 1951  SUBJECTIVE:  Kidney function slowly improving, clinically about the same REVIEW OF SYSTEMS:   Review of Systems  Unable to perform ROS: Medical condition  remains confused DRUG ALLERGIES:  No Known Allergies  VITALS:  Blood pressure 132/81, pulse (!) 53, temperature (!) 97.5 F (36.4 C), temperature source Oral, resp. rate 14, height 6' (1.829 m), weight 79.2 kg, SpO2 97 %.  PHYSICAL EXAMINATION:   Physical Exam-- limited exam due pt unable to participate  GENERAL:  69 y.o.-year-old patient lying in the bed with no acute distress.  Appears severely dehydrated LUNGS: Normal breath sounds bilaterally, no wheezing No use of accessory muscles of respiration.  CARDIOVASCULAR: S1, S2 normal.  NEUROLOGIC: alert, confused. Follows simple verbal commands SKIN: No obvious rash, lesion, or ulcer-- per RN  LABORATORY PANEL:  CBC Recent Labs  Lab 01/17/20 0506  WBC 6.5  HGB 11.4*  HCT 35.9*  PLT 98*    Chemistries  Recent Labs  Lab 01/15/20 0550 01/15/20 1504 01/17/20 0506  NA 151*   < > 148*  K 3.5   < > 3.0*  CL 113*   < > 108  CO2 20*   < > 21*  GLUCOSE 122*   < > 127*  BUN 226*   < > 191*  CREATININE 9.40*   < > 8.29*  CALCIUM 8.0*   < > 8.3*  MG  --   --  2.9*  AST 45*  --   --   ALT 31  --   --   ALKPHOS 53  --   --   BILITOT 0.9  --   --    < > = values in this interval not displayed.   Cardiac Enzymes No results for input(s): TROPONINI in the last 168 hours. RADIOLOGY:  No results found. ASSESSMENT AND PLAN:  Samuel Shelton is a 69 y.o. male with medical history significant of mental retardation, hypertension, hyperlipidemia, stroke, BPH, who presents with altered mental status.  Severe Hypernatremia: Na 170 on admission   -ultrasound renal essentially nothing acute - Serum Sodium 170->152->148 - on D5W @ 100 cc /  hr per nephro  AKI with ATN secondary to severe dehydration - S creatinine 2.96->9.1->8.29 --US-renal --> unremarkable finding -Nephrology following. No plan for HD but conservative mgmt as he continues to make urine and kidney function slowly improving  Chronic mental disorder and acute metabolic encephalopathy, uremic -CT head is negative for acute intracranial abnormalities, likely due to electrolytes disturbance and worsening renal function  Hypercalcemia  resolved  Recent COVID-19 virus infection: Patient had a positive COVID-19 on 12/18, no fever.   Overallguarded prognosis.  Family communication : Patient's niece - Ms [name] [phone] is in agreement with current mgmt Consults : nephrology, palliative care  discharge Disposition : SNF with outpatient Palliative, then transition to long term care CODE STATUS:DNR DVT Prophylaxis scd  TOTAL TIME TAKING CARE OF THIS PATIENT: *35* minutes.  >50% time spent on counselling and coordination of care  Note: This dictation was prepared with Dragon dictation along with smaller phrase technology. Any transcriptional errors that result from this process are unintentional.  Max Sane M.D on 01/17/2020 at 3:06 PM  [facility]   CC: Primary care physician; Sofie Hartigan, MDPatient ID: Samuel Shelton, male   DOB: 07/28/1951, 69 y.o.   MRN: [id number]

## 2020-01-17 NOTE — NC FL2 (Signed)
[place] LEVEL OF CARE SCREENING TOOL     IDENTIFICATION  Patient Name: Samuel Shelton Birthdate: 1951-06-11 Sex: male Admission Date (Current Location): 01/08/2020  [place] and [place] Number:  Engineering geologist and Address:  [facility], [address] [place], [place] [address]      Provider Number: [id number]  Attending Physician Name and Address:  Max Sane, MD  Relative Name and Phone Number:       Current Level of Care: Hospital Recommended Level of Care: [place] Prior Approval Number:    Date Approved/Denied:   PASRR Number: [id number]  Discharge Plan: SNF    Current Diagnoses: Patient Active Problem List   Diagnosis Date Noted  . DNR (do not resuscitate) discussion   . Hypernatremia 01/08/2020  . Hypercalcemia 01/08/2020  . Acute metabolic encephalopathy [id number]  . COVID-19 virus infection 01/08/2020  . AKI (acute kidney injury) ([place]) 01/08/2020  . Dehydration 01/08/2020  . Elevated troponin 01/08/2020  . Lactic acidosis 01/08/2020  . Altered mental status   . Hyperlipidemia 01/29/2019  . Family hx-stroke 01/29/2019  . BPH (benign prostatic hyperplasia) 01/29/2019  . Stroke (cerebrum) ([place]) 01/28/2019  . Edema leg 08/20/2014  . Chronic mental disorder 08/20/2014  . HTN (hypertension) 08/20/2014  . Intellectual disability 08/20/2014    Orientation RESPIRATION BLADDER Height & Weight     (unable to respond to orrientation questions per RN)  Normal Incontinent, External catheter Weight: 79.2 kg Height:  6' (182.9 cm)  BEHAVIORAL SYMPTOMS/MOOD NEUROLOGICAL BOWEL NUTRITION STATUS      Incontinent Diet(npo)  AMBULATORY STATUS COMMUNICATION OF NEEDS Skin   Extensive Assist Non-Verbally PU Stage and Appropriate Care                       Personal Care Assistance Level of Assistance  Bathing, Feeding, Dressing, Total care Bathing Assistance: Maximum assistance Feeding assistance:  Maximum assistance Dressing Assistance: Maximum assistance Total Care Assistance: Maximum assistance   Functional Limitations Info  Speech Sight Info: Adequate Hearing Info: Adequate Speech Info: Impaired    SPECIAL CARE FACTORS FREQUENCY  PT (By licensed PT), OT (By licensed OT)     PT Frequency: 5x OT Frequency: 5x            Contractures Contractures Info: Not present    Additional Factors Info  Code Status, Allergies Code Status Info: DNR Allergies Info: NKDA     Isolation Precautions Info: COVID+     Current Medications (01/17/2020):  This is the current hospital active medication list Current Facility-Administered Medications  Medication Dose Route Frequency Provider Last Rate Last Admin  . acetaminophen (TYLENOL) tablet 650 mg  650 mg Oral Q6H PRN Ivor Costa, MD       Or  . acetaminophen (TYLENOL) suppository 650 mg  650 mg Rectal Q6H PRN Ivor Costa, MD      . dextrose 5 % with KCl 20 mEq / L  infusion  20 mEq Intravenous Continuous Max Sane, MD 100 mL/hr at 01/17/20 0934 20 mEq at 01/17/20 0934  . HYDROmorphone (DILAUDID) injection 1 mg  1 mg Intravenous Q2H PRN Max Sane, MD      . MEDLINE mouth rinse  15 mL Mouth Rinse q12n4p Fritzi Mandes, MD   15 mL at 01/14/20 1529  . ondansetron (ZOFRAN) injection 4 mg  4 mg Intravenous Q8H PRN Ivor Costa, MD         Discharge Medications: Please see discharge summary for a  list of discharge medications.  Relevant Imaging Results:  Relevant Lab Results:   Additional Information [id number]  Refugio Vandevoorde, Illene Silver, RN

## 2020-01-17 NOTE — Progress Notes (Signed)
New referral for [facility] community Palliative program to follow post discharge received from [place]. Discharge location to be determined, bed search has been initiated by TOC. Patient information given to referral. Thank you. Flo Shanks BSN, RN, [place] [phone]

## 2020-01-17 NOTE — Evaluation (Addendum)
Physical Therapy Evaluation Patient Details Name: Samuel Shelton MRN: [id number] DOB: 26-Jun-1951 Today's Date: 01/17/2020   History of Present Illness  Pt is 89 male with PMH of hypertension, hyperlipidemia, stroke 2019, BPH. A resident of [place] home, of note Covid virus infection 12/ 2019 admitted on 01/08/2020 for acute kidney failure, hypernatremia, AMS.    Clinical Impression  Patient with eyes open, able to attend to PT as well as TV upon pt cueing. Pt did not verbalize during session, per chart patient has history of mental disability, and per RN communicates by blinking. Patient awake and alert throughout.  PT attempted ROM to assess extremity strength/movement, primarily PROM to accomplish range with multimodal cueing. Pt may have assisted with LUE movement. PT provided step by step cueing and facilitation to attempt functional mobility such as sitting but unable to come fully into sitting due to patient's posterior lean. Environment set up by PT to encourage pt to remain awake during day light hours (lights on, bed positioned with pt sitting up, TV turned on). The patient demonstrated deficits in functional mobility and would benefit from further skilled PT intervention. Placed on trial PT for 3-5 sessions for continued assessment as well as to assess the patients ability to participate.     Follow Up Recommendations SNF    Equipment Recommendations  Other (comment)(TBD at next venue of care)    Recommendations for Other Services       Precautions / Restrictions Precautions Precautions: Fall Restrictions Weight Bearing Restrictions: No      Mobility  Bed Mobility               General bed mobility comments: attempted to initiate pt to sit EOB difficulty noted though hard to assess whether limited due to weakness versus cognition.  Transfers                    Ambulation/Gait                Stairs            Wheelchair  Mobility    Modified Rankin (Stroke Patients Only)       Balance                                             Pertinent Vitals/Pain Pain Assessment: Faces Faces Pain Scale: No hurt    Home Living                        Prior Function           Comments: Pt did not speak to PT during session, unable to provide PLOF. Per hospital admission 6 months ago pt previously able to ambulate without AD.     Hand Dominance        Extremity/Trunk Assessment   Upper Extremity Assessment Upper Extremity Assessment: Difficult to assess due to impaired cognition    Lower Extremity Assessment Lower Extremity Assessment: Difficult to assess due to impaired cognition       Communication      Cognition Arousal/Alertness: Awake/alert Behavior During Therapy: Flat affect Overall Cognitive Status: No family/caregiver present to determine baseline cognitive functioning  General Comments      Exercises Other Exercises Other Exercises: x5 heel slides, hip abduction, shoulder flexion bilaterally, primarily PROM Other Exercises: Environment set up by PT to encourage pt to remain awake during day light hours (lights on, bed positioned with pt sitting up, TV turned on).   Assessment/Plan    PT Assessment Patient needs continued PT services(PT trial of 3-5 visits to assess the patient's ability to participate with physical therapy.)  PT Problem List Decreased mobility;Decreased activity tolerance;Decreased balance;Decreased knowledge of use of DME;Decreased safety awareness       PT Treatment Interventions DME instruction;Therapeutic activities;Gait training;Therapeutic exercise;Patient/family education;Stair training;Balance training;Functional mobility training;Neuromuscular re-education    PT Goals (Current goals can be found in the Care Plan section)  Acute Rehab PT Goals PT Goal Formulation:  Patient unable to participate in goal setting Time For Goal Achievement: 01/31/20 Potential to Achieve Goals: Fair    Frequency Min 2X/week(PT trial)   Barriers to discharge        Co-evaluation               AM-PAC PT "6 Clicks" Mobility  Outcome Measure Help needed turning from your back to your side while in a flat bed without using bedrails?: Total Help needed moving from lying on your back to sitting on the side of a flat bed without using bedrails?: Total Help needed moving to and from a bed to a chair (including a wheelchair)?: Total Help needed standing up from a chair using your arms (e.g., wheelchair or bedside chair)?: Total Help needed to walk in hospital room?: Total Help needed climbing 3-5 steps with a railing? : Total 6 Click Score: 6    End of Session   Activity Tolerance: Other (comment)(limited by patient participation) Patient left: in bed;with call bell/phone within reach;with bed alarm set Nurse Communication: Mobility status PT Visit Diagnosis: Other abnormalities of gait and mobility (R26.89);Muscle weakness (generalized) (M62.81)    Time: [id number] PT Time Calculation (min) (ACUTE ONLY): 13 min   Charges:   PT Evaluation $PT Eval Moderate Complexity: 1 Mod          Lieutenant Diego PT, DPT 12:53 PM,01/17/20

## 2020-01-17 NOTE — Progress Notes (Signed)
Patient is refusing all oral care. He will only allow RN to put gel on lips.

## 2020-01-17 NOTE — Consult Note (Signed)
[place] for Electrolyte Monitoring and Replacement   Recent Labs: Potassium (mmol/L)  Date Value  01/17/2020 3.0 (L)   Magnesium (mg/dL)  Date Value  01/17/2020 2.9 (H)   Calcium (mg/dL)  Date Value  01/17/2020 8.3 (L)   Albumin (g/dL)  Date Value  01/16/2020 2.3 (L)   Phosphorus (mg/dL)  Date Value  01/16/2020 9.2 (H)   Sodium (mmol/L)  Date Value  01/17/2020 148 (H)   Corrected Ca: 9.7 mg/dL  Assessment: 68 y.o.malewith medical history significant ofmental retardation, hypertension, hyperlipidemia, stroke, BPH, who presents with altered mental status. His renal function is poor but improving. He is not on HD due to conservative management in the setting of continuing to make urine. He has been receiving D5 for hypernatremia, which continues to improve  Goal of Therapy:  Electrolytes WNL  Plan:   Continue D5 at 100 mL per hour after adding 20 mEq/L KCl  F/U electrolytes in am  Dallie Piles ,PharmD Clinical Pharmacist 01/17/2020 3:05 PM

## 2020-01-17 NOTE — Progress Notes (Signed)
Attempted to give Pt Pos without success. Pt shook his head no when asked if he would try anything to eat. Offered a cup with water, ice chips, applesauce. Pt turned his head away each time with lips clenched lips  When the spoon was placed near his mouth. When asked if he would take food from family he widened his eyes and nodded his head. Attempted further discussed of importance of taking in some food or liquid but Pt turned his head away and closed his eyes. If alternative nutrition is not an option for this Pt, based on few Po's taken on bedside swallow eval, rec MD consider a dysphagia 1 diet with honey thick liquids to be offered to Pt. If Pt takes Po's and shows any s/s of aspiration, discontinue feeding. Although Pt is likely to refuse.

## 2020-01-17 NOTE — Progress Notes (Signed)
[facility] [place], [place] 01/17/20  Subjective:   Hospital day # 9 Patient remains critically ill cvs: Not on any pressors pulm: Currently on room air Appears more alert , able to follow simple commands Currently NPO Renal: Critically elevated BUN and creatinine, but imnproving 01/21 0701 - 01/22 0700 In: 660.2 [I.V.:660.2] Out: 1500 [Urine:1500] Lab Results  Component Value Date   CREATININE 8.29 (H) 01/17/2020   CREATININE 9.13 (H) 01/16/2020   CREATININE 9.22 (H) 01/15/2020     Objective:  Vital signs in last 24 hours:  Temp:  [98.2 F (36.8 C)-98.8 F (37.1 C)] 98.8 F (37.1 C) (01/22 0454) Pulse Rate:  [55-80] 67 (01/22 0454) Resp:  [14-16] 14 (01/22 0454) BP: (103-185)/(59-92) 103/59 (01/22 0454) SpO2:  [95 %-100 %] 100 % (01/22 0454)  Weight change:  Filed Weights   01/08/20 0950 01/11/20 0108  Weight: 85.7 kg 79.2 kg    Intake/Output:    Intake/Output Summary (Last 24 hours) at 01/17/2020 1037 Last data filed at 01/17/2020 0455 Gross per 24 hour  Intake 660.19 ml  Output 1500 ml  Net -839.81 ml     Physical Exam: General:  No acute distress, chronically ill-appearing, cachectic  HEENT  moist oral mucous membranes  Pulm/lungs  room air, normal breathing effort  CVS/Heart  regular rhythm  Abdomen:   Soft, nontender  Extremities:  No peripheral edema  Neurologic:  alert, following commands  Skin:  Warm, left arm edema blister  Foley catheter in place    Basic Metabolic Panel:  Recent Labs  Lab 01/10/20 2111 01/10/20 2111 01/11/20 0716 01/11/20 0716 01/15/20 0550 01/15/20 0550 01/15/20 1504 01/16/20 0326 01/17/20 0506  NA 154*   < > 151*  --  151*  --  149* 152* 148*  K 4.2   < > 4.1  --  3.5  --  3.4* 3.2* 3.0*  CL 116*   < > 109  --  113*  --  112* 112* 108  CO2 19*   < > 23  --  20*  --  19* 21* 21*  GLUCOSE 202*   < > 154*  --  122*  --  144* 132* 127*  BUN 163*   < > 183*  --  226*  --  209* 192* 191*   CREATININE 6.40*   < > 7.06*  --  9.40*  --  9.22* 9.13* 8.29*  CALCIUM 8.8*   < > 8.6*   < > 8.0*   < > 8.0* 8.1* 8.3*  MG 4.4*  --  3.9*  --   --   --   --   --  2.9*  PHOS  --   --  9.1*  --   --   --   --  9.2*  --    < > = values in this interval not displayed.     CBC: Recent Labs  Lab 01/17/20 0506  WBC 6.5  HGB 11.4*  HCT 35.9*  MCV 87.3  PLT 98*     No results found for: HEPBSAG, HEPBSAB, HEPBIGM    Microbiology:  Recent Results (from the past 240 hour(s))  MRSA PCR Screening     Status: None   Collection Time: 01/08/20  5:10 PM   Specimen: Urine, Clean Catch; Nasopharyngeal  Result Value Ref Range Status   MRSA by PCR NEGATIVE NEGATIVE Final    Comment:        The GeneXpert MRSA Assay (FDA approved for NASAL  specimens only), is one component of a comprehensive MRSA colonization surveillance program. It is not intended to diagnose MRSA infection nor to guide or monitor treatment for MRSA infections. Performed at [facility], [address] [place], [place] [address]   MRSA PCR Screening     Status: None   Collection Time: 01/11/20  2:16 AM   Specimen: Nasopharyngeal  Result Value Ref Range Status   MRSA by PCR NEGATIVE NEGATIVE Final    Comment:        The GeneXpert MRSA Assay (FDA approved for NASAL specimens only), is one component of a comprehensive MRSA colonization surveillance program. It is not intended to diagnose MRSA infection nor to guide or monitor treatment for MRSA infections. Performed at [facility], [address] [place], [place] [address]   Respiratory Panel by RT PCR (Flu A&B, Covid) - Nasopharyngeal Swab     Status: Abnormal   Collection Time: 01/11/20  2:12 PM   Specimen: Nasopharyngeal Swab  Result Value Ref Range Status   SARS Coronavirus 2 by RT PCR POSITIVE (A) NEGATIVE Final    Comment: RESULT CALLED TO, READ BACK BY AND VERIFIED WITH: [facility] SMITH AT [id number] 01/11/20.PMF (NOTE) SARS-CoV-2  target nucleic acids are DETECTED. SARS-CoV-2 RNA is generally detectable in upper respiratory specimens  during the acute phase of infection. Positive results are indicative of the presence of the identified virus, but do not rule out bacterial infection or co-infection with other pathogens not detected by the test. Clinical correlation with patient history and other diagnostic information is necessary to determine patient infection status. The expected result is Negative. Fact Sheet for Patients:  PinkCheek.be Fact Sheet for Healthcare Providers: GravelBags.it This test is not yet approved or cleared by the [place] FDA and  has been authorized for detection and/or diagnosis of SARS-CoV-2 by FDA under an Emergency Use Authorization (EUA).  This EUA will remain in effect (meaning this test can be used) for  the duration of  the COVID-19 declaration under Section 564(b)(1) of the Act, 21 U.S.C. section 360bbb-3(b)(1), unless the authorization is terminated or revoked sooner.    Influenza A by PCR NEGATIVE NEGATIVE Final   Influenza B by PCR NEGATIVE NEGATIVE Final    Comment: (NOTE) The Xpert Xpress SARS-CoV-2/FLU/RSV assay is intended as an aid in  the diagnosis of influenza from Nasopharyngeal swab specimens and  should not be used as a sole basis for treatment. Nasal washings and  aspirates are unacceptable for Xpert Xpress SARS-CoV-2/FLU/RSV  testing. Fact Sheet for Patients: PinkCheek.be Fact Sheet for Healthcare Providers: GravelBags.it This test is not yet approved or cleared by the [place] FDA and  has been authorized for detection and/or diagnosis of SARS-CoV-2 by  FDA under an Emergency Use Authorization (EUA). This EUA will remain  in effect (meaning this test can be used) for the duration of the  Covid-19 declaration under Section 564(b)(1) of  the Act, 21  U.S.C. section 360bbb-3(b)(1), unless the authorization is  terminated or revoked. Performed at [facility], [address] [place],  [address]     Coagulation Studies: No results for input(s): LABPROT, INR in the last 72 hours.  Urinalysis: No results for input(s): COLORURINE, LABSPEC, PHURINE, GLUCOSEU, HGBUR, BILIRUBINUR, KETONESUR, PROTEINUR, UROBILINOGEN, NITRITE, LEUKOCYTESUR in the last 72 hours.  Invalid input(s): APPERANCEUR    Imaging: No results found.   Medications:   . dextrose 5 % with KCl 20 mEq / L 20 mEq (01/17/20 0934)   .  mouth rinse  15 mL Mouth Rinse q12n4p   acetaminophen **OR** acetaminophen, HYDROmorphone (DILAUDID) injection, ondansetron (ZOFRAN) IV  Assessment/ Plan:  69 y.o. male with history of mental retardation, hypertension, hyperlipidemia, stroke 2019, BPH, resident of [place] home Covid virus infection 12/ 2019 admitted on 01/08/2020 for Acute kidney failure, unspecified ([place]) [N17.9] Hypernatremia [E87.0] Altered mental status, unspecified altered mental status type [R41.82]  #Acute renal failure Baseline creatinine of 0.95 in February 2020 Admission creatinine of 3.15, BUN 146 Progressively serum creatinine has increased to 9.40 with BUN of 226 Phosphorus of 9.1 AKI likely secondary to ATN from Severe dehydration.  Recent history of COVID-19 infection in December 2020 Renal U/S neg for obstruction Conservative management for now with iv fluids UOP is improving.  Monitor BMP daily Can consider condom catheter   #Severe hypernatremia secondary to dehydration patient presented with sodium of 170 D5W at 75 cc/hr--> increase to 100 cc / hr Follow labs closely Inability to take PO is a significant challenge  # Hypokalemia Agree with replacement as necessary  Patient's niece - Ms [name] [phone]   LOS: [address] 1/22/202110:37 [place], [place]  Note: This note was prepared with Dragon dictation. Any transcription errors are unintentional

## 2020-01-18 LAB — BASIC METABOLIC PANEL
Anion gap: 14 (ref 5–15)
BUN: 160 mg/dL — ABNORMAL HIGH (ref 8–23)
CO2: 22 mmol/L (ref 22–32)
Calcium: 8.4 mg/dL — ABNORMAL LOW (ref 8.9–10.3)
Chloride: 110 mmol/L (ref 98–111)
Creatinine, Ser: 7.11 mg/dL — ABNORMAL HIGH (ref 0.61–1.24)
GFR calc Af Amer: 8 mL/min — ABNORMAL LOW (ref >60)
GFR calc non Af Amer: 7 mL/min — ABNORMAL LOW (ref >60)
Glucose, Bld: 129 mg/dL — ABNORMAL HIGH (ref 70–99)
Potassium: 3.2 mmol/L — ABNORMAL LOW (ref 3.5–5.1)
Sodium: 146 mmol/L — ABNORMAL HIGH (ref 135–145)

## 2020-01-18 MED ORDER — POTASSIUM CHLORIDE 20 MEQ PO PACK
40.0000 meq | PACK | Freq: Once | ORAL | Status: DC
  Filled 2020-01-18: qty 2

## 2020-01-18 MED ORDER — POTASSIUM CHLORIDE 10 MEQ/100ML IV SOLN
10.0000 meq | INTRAVENOUS | Status: AC
  Administered 2020-01-18 (×4): 10 meq via INTRAVENOUS
  Filled 2020-01-18 (×4): qty 100

## 2020-01-18 NOTE — Progress Notes (Signed)
[facility], [place] 01/18/20  Subjective:  BUN currently down to 160 with a creatinine of 7.11. Urine output recorded as being 450 cc. Nursing reports patient eating very little. Receiving potassium chloride intravenously.   Objective:  Vital signs in last 24 hours:  Temp:  [98.2 F (36.8 C)-98.3 F (36.8 C)] 98.2 F (36.8 C) (01/23 0430) Pulse Rate:  [52-67] 67 (01/23 0430) Resp:  [20] 20 (01/23 0430) BP: (115-138)/(85-86) 138/86 (01/23 0430) SpO2:  [98 %-99 %] 98 % (01/23 0430)  Weight change:  Filed Weights   01/08/20 0950 01/11/20 0108  Weight: 85.7 kg 79.2 kg    Intake/Output:    Intake/Output Summary (Last 24 hours) at 01/18/2020 1516 Last data filed at 01/18/2020 1100 Gross per 24 hour  Intake 3161.93 ml  Output 550 ml  Net 2611.93 ml     Physical Exam: General:  No acute distress, chronically ill-appearing, cachectic  HEENT  moist oral mucous membranes  Pulm/lungs  room air, normal breathing effort  CVS/Heart  regular rhythm  Abdomen:   Soft, nontender  Extremities:  No peripheral edema  Neurologic:  alert, following commands  Skin:  Warm, left arm edema blister    Basic Metabolic Panel:  Recent Labs  Lab 01/15/20 0550 01/15/20 0550 01/15/20 1504 01/15/20 1504 01/16/20 0326 01/17/20 0506 01/18/20 0656  NA 151*  --  149*  --  152* 148* 146*  K 3.5  --  3.4*  --  3.2* 3.0* 3.2*  CL 113*  --  112*  --  112* 108 110  CO2 20*  --  19*  --  21* 21* 22  GLUCOSE 122*  --  144*  --  132* 127* 129*  BUN 226*  --  209*  --  192* 191* 160*  CREATININE 9.40*  --  9.22*  --  9.13* 8.29* 7.11*  CALCIUM 8.0*   < > 8.0*   < > 8.1* 8.3* 8.4*  MG  --   --   --   --   --  2.9*  --   PHOS  --   --   --   --  9.2*  --   --    < > = values in this interval not displayed.     CBC: Recent Labs  Lab 01/17/20 0506  WBC 6.5  HGB 11.4*  HCT 35.9*  MCV 87.3  PLT 98*     No results found for: HEPBSAG, HEPBSAB,  HEPBIGM    Microbiology:  Recent Results (from the past 240 hour(s))  MRSA PCR Screening     Status: None   Collection Time: 01/08/20  5:10 PM   Specimen: Urine, Clean Catch; Nasopharyngeal  Result Value Ref Range Status   MRSA by PCR NEGATIVE NEGATIVE Final    Comment:        The GeneXpert MRSA Assay (FDA approved for NASAL specimens only), is one component of a comprehensive MRSA colonization surveillance program. It is not intended to diagnose MRSA infection nor to guide or monitor treatment for MRSA infections. Performed at [facility], [address] [place], [place] [address]   MRSA PCR Screening     Status: None   Collection Time: 01/11/20  2:16 AM   Specimen: Nasopharyngeal  Result Value Ref Range Status   MRSA by PCR NEGATIVE NEGATIVE Final    Comment:        The GeneXpert MRSA Assay (FDA approved for NASAL specimens only), is one component of  a comprehensive MRSA colonization surveillance program. It is not intended to diagnose MRSA infection nor to guide or monitor treatment for MRSA infections. Performed at [facility], [address] [place], [place] [address]   Respiratory Panel by RT PCR (Flu A&B, Covid) - Nasopharyngeal Swab     Status: Abnormal   Collection Time: 01/11/20  2:12 PM   Specimen: Nasopharyngeal Swab  Result Value Ref Range Status   SARS Coronavirus 2 by RT PCR POSITIVE (A) NEGATIVE Final    Comment: RESULT CALLED TO, READ BACK BY AND VERIFIED WITH: [facility] SMITH AT [id number] 01/11/20.PMF (NOTE) SARS-CoV-2 target nucleic acids are DETECTED. SARS-CoV-2 RNA is generally detectable in upper respiratory specimens  during the acute phase of infection. Positive results are indicative of the presence of the identified virus, but do not rule out bacterial infection or co-infection with other pathogens not detected by the test. Clinical correlation with patient history and other diagnostic information is necessary to  determine patient infection status. The expected result is Negative. Fact Sheet for Patients:  PinkCheek.be Fact Sheet for Healthcare Providers: GravelBags.it This test is not yet approved or cleared by the [place] FDA and  has been authorized for detection and/or diagnosis of SARS-CoV-2 by FDA under an Emergency Use Authorization (EUA).  This EUA will remain in effect (meaning this test can be used) for  the duration of  the COVID-19 declaration under Section 564(b)(1) of the Act, 21 U.S.C. section 360bbb-3(b)(1), unless the authorization is terminated or revoked sooner.    Influenza A by PCR NEGATIVE NEGATIVE Final   Influenza B by PCR NEGATIVE NEGATIVE Final    Comment: (NOTE) The Xpert Xpress SARS-CoV-2/FLU/RSV assay is intended as an aid in  the diagnosis of influenza from Nasopharyngeal swab specimens and  should not be used as a sole basis for treatment. Nasal washings and  aspirates are unacceptable for Xpert Xpress SARS-CoV-2/FLU/RSV  testing. Fact Sheet for Patients: PinkCheek.be Fact Sheet for Healthcare Providers: GravelBags.it This test is not yet approved or cleared by the [place] FDA and  has been authorized for detection and/or diagnosis of SARS-CoV-2 by  FDA under an Emergency Use Authorization (EUA). This EUA will remain  in effect (meaning this test can be used) for the duration of the  Covid-19 declaration under Section 564(b)(1) of the Act, 21  U.S.C. section 360bbb-3(b)(1), unless the authorization is  terminated or revoked. Performed at [facility], [address] [place], [place] [address]     Coagulation Studies: No results for input(s): LABPROT, INR in the last 72 hours.  Urinalysis: No results for input(s): COLORURINE, LABSPEC, PHURINE, GLUCOSEU, HGBUR, BILIRUBINUR, KETONESUR, PROTEINUR, UROBILINOGEN, NITRITE,  LEUKOCYTESUR in the last 72 hours.  Invalid input(s): APPERANCEUR    Imaging: No results found.   Medications:   . dextrose 5 % with KCl 20 mEq / L 100 mL/hr at 01/18/20 1100  . potassium chloride     . mouth rinse  15 mL Mouth Rinse q12n4p   acetaminophen **OR** acetaminophen, HYDROmorphone (DILAUDID) injection, ondansetron (ZOFRAN) IV  Assessment/ Plan:  69 y.o. male with history of mental retardation, hypertension, hyperlipidemia, stroke 2019, BPH, resident of [place] home Covid virus infection 12/ 2019 admitted on 01/08/2020 for Acute kidney failure, unspecified ([place]) [N17.9] Hypernatremia [E87.0] Altered mental status, unspecified altered mental status type [R41.82]  #Acute renal failure Baseline creatinine of 0.95 in February 2020 Admission creatinine of 3.15, BUN 146 Progressively serum creatinine has increased to 9.40 with BUN of  226 Phosphorus of 9.1 AKI likely secondary to ATN from Severe dehydration.  Recent history of COVID-19 infection in December 2020 Renal U/S neg for obstruction -BUN and creatinine remain high but are trending down.  BUN currently 160 with a creatinine of 7.11.  No urgent indication for dialysis at the moment as renal parameters do appear to be improving.  #Severe hypernatremia secondary to dehydration  serum sodium trending down in the right direction and currently 146.  Continue D5W.  # Hypokalemia Potassium trending up and currently 3.2.  Continue IV potassium runs.  Patient's niece - Ms [name] [phone]   LOS: 10 [address] [place] 1/23/20213:16 PM  [place], [place]  Note: This note was prepared with Dragon dictation. Any transcription errors are unintentional

## 2020-01-18 NOTE — Progress Notes (Signed)
Patient is refusing PO.  KCL packet ordered, patient refused.  MD notified. No new orders given at this time.

## 2020-01-18 NOTE — Progress Notes (Signed)
Samuel Shelton    MR#:  [id number]  DATE OF BIRTH:  June 25, 1951  SUBJECTIVE:  Sodium and kidney function slowly improving, no new issues REVIEW OF SYSTEMS:   Review of Systems  Unable to perform ROS: Medical condition  remains confused, nonverbal DRUG ALLERGIES:  No Known Allergies  VITALS:  Blood pressure 138/86, pulse 67, temperature 98.2 F (36.8 C), temperature source Oral, resp. rate 20, height 6' (1.829 m), weight 79.2 kg, SpO2 98 %.  PHYSICAL EXAMINATION:   Physical Exam-- limited exam due pt unable to participate  GENERAL:  70 y.o.-year-old patient lying in the bed with no acute distress.  Appears severely dehydrated LUNGS: Normal breath sounds bilaterally, no wheezing No use of accessory muscles of respiration.  CARDIOVASCULAR: S1, S2 normal.  NEUROLOGIC: alert, confused. Follows simple verbal commands SKIN: No obvious rash, lesion, or ulcer-- per RN  LABORATORY PANEL:  CBC Recent Labs  Lab 01/17/20 0506  WBC 6.5  HGB 11.4*  HCT 35.9*  PLT 98*    Chemistries  Recent Labs  Lab 01/15/20 0550 01/15/20 1504 01/17/20 0506 01/17/20 0506 01/18/20 0656  NA 151*   < > 148*   < > 146*  K 3.5   < > 3.0*   < > 3.2*  CL 113*   < > 108   < > 110  CO2 20*   < > 21*   < > 22  GLUCOSE 122*   < > 127*   < > 129*  BUN 226*   < > 191*   < > 160*  CREATININE 9.40*   < > 8.29*   < > 7.11*  CALCIUM 8.0*   < > 8.3*   < > 8.4*  MG  --   --  2.9*  --   --   AST 45*  --   --   --   --   ALT 31  --   --   --   --   ALKPHOS 53  --   --   --   --   BILITOT 0.9  --   --   --   --    < > = values in this interval not displayed.   Cardiac Enzymes No results for input(s): TROPONINI in the last 168 hours. RADIOLOGY:  No results found. ASSESSMENT AND PLAN:  Samuel Shelton is a 69 y.o. male with medical history significant of mental retardation, hypertension, hyperlipidemia, stroke, BPH, who presents with  altered mental status.  Severe Hypernatremia: Na 170 on admission   -ultrasound renal essentially nothing acute - Serum Sodium 170-> 146 - on D5W @ 100 cc / hr per nephro  AKI with ATN secondary to severe dehydration - S creatinine 2.96-> 7.1 --US-renal --> unremarkable finding -Nephrology following. No plan for HD but conservative mgmt as he continues to make urine and kidney function slowly improving  Chronic mental disorder and acute metabolic encephalopathy, uremic -CT head is negative for acute intracranial abnormalities, likely due to electrolytes disturbance and worsening renal function  Hypercalcemia  resolved  Recent COVID-19 virus infection: Patient had a positive COVID-19 on 12/18, no fever.   Overallguarded prognosis.  Family communication : Patient's niece - Ms [name] [phone] is in agreement with current mgmt Consults : nephrology, palliative care  discharge Disposition : SNF with outpatient Palliative, then transition to long term care CODE STATUS:DNR DVT Prophylaxis scd  TOTAL TIME TAKING  CARE OF THIS PATIENT: *25* minutes.  >50% time spent on counselling and coordination of care  Note: This dictation was prepared with Dragon dictation along with smaller phrase technology. Any transcriptional errors that result from this process are unintentional.  Samuel Shelton M.D on 01/18/2020 at 10:59 AM  [facility]   CC: Primary care physician; Samuel Shelton, MDPatient ID: Samuel Shelton, male   DOB: 27-Feb-1951, 69 y.o.   MRN: [id number]

## 2020-01-18 NOTE — Evaluation (Signed)
Occupational Therapy Evaluation Patient Details Name: Samuel Shelton MRN: [id number] DOB: January 11, 1951 Today's Date: 01/18/2020    History of Present Illness Pt is 14 male with PMH of hypertension, hyperlipidemia, stroke 2019, BPH. A resident of [place] home, of note Covid virus infection 12/ 2019 admitted on 01/08/2020 for acute kidney failure, hypernatremia, AMS.   Clinical Impression   Pt was seen for OT evaluation this date. Prior to hospital admission, Per MD note, pt was staying at [facility]. PLOF unclear as pt does not speak to OT throughout session and potentially poor historian 2/2 cognitive status with no family at bedside to ascertain PLOF information. Currently pt demonstrates impairments as described below (See OT problem list) which functionally limit his ability to perform ADL/self-care tasks. Pt does not appear to attempt participation in OT's assessment of upper extremities-assessment is primarily passive and some resistance is noted which limits OT capacity to assess ROM/MMT. Pt currently requires TOTAL A for all aspects of self care and mobility including g/h tasks at bed level and repositioning in bed for comfort.  Pt would benefit from skilled OT to address noted impairments and functional limitations (see below for any additional details) in order to maximize safety and independence while minimizing falls risk and caregiver burden. Upon hospital discharge, recommend pt d/c to SNF for safety with self care ADLs/ADL mobility and to help pt achieve his highest level of performance attainable.      Follow Up Recommendations  SNF    Equipment Recommendations  Other (comment)(defer to next venue of care)    Recommendations for Other Services       Precautions / Restrictions Precautions Precautions: Fall Restrictions Weight Bearing Restrictions: No      Mobility Bed Mobility               General bed mobility comments: OT attempts to  encourage pt to participate in bed mobility on some level including rolling, or sup to sit. Pt does not appear to engage in task, difficult to assess whether d/t weakness versus cognition. Ultimately requires TOTAL A for propulsion towards HOB to optimize pt positioning in bed.  Transfers                      Balance                                           ADL either performed or assessed with clinical judgement   ADL                                         General ADL Comments: TOTAL A for all self care assessed. Pt does not appear to attempt to engage in UB g/h tasks at bed level that OT attempts with pt including washing face, oral care, or attempting to take bite of applesauce from breakfast tray.     Vision   Additional Comments: unable to formally assess, pt does somewhat track appropriately. Intermittent eye closing/intermittent visual attention.     Perception     Praxis      Pertinent Vitals/Pain Pain Assessment: Faces Faces Pain Scale: No hurt     Hand Dominance Right   Extremity/Trunk Assessment Upper Extremity Assessment Upper Extremity Assessment: Difficult to assess due to  impaired cognition(OT attempts PROM assessment, but pt resists participation, will intermittently move UEs some at lib (ex: he yawns and moves R UE towards mouth some))   Lower Extremity Assessment Lower Extremity Assessment: Difficult to assess due to impaired cognition       Communication Communication Communication: Expressive difficulties   Cognition Arousal/Alertness: Awake/alert Behavior During Therapy: Flat affect Overall Cognitive Status: No family/caregiver present to determine baseline cognitive functioning                                 General Comments: Pt does not speak to OT throughout session, does not appear to follow commands for UE assessment or participation in self care (washing face or oral care). Pt does  nod/shake head some, but inconsistently communicates this way.   General Comments       Exercises Other Exercises Other Exercises: attempted to engage pt in 5 reps straigh arm raises, one side at a time. Primarily PROM, some resistance to activity, only 2-3 reps acheived per side.   Shoulder Instructions      Home Living Family/patient expects to be discharged to:: Skilled nursing facility([facility])                                        Prior Functioning/Environment          Comments: Pt does not speak throughout OT session. He does communicate somewhat with shaking/nodding head, but is inconsistent. Per MD notes, pt presented from [facility] where he is a resident. Per previous therapy assessment (6 months ago) pt was walking w/o AD prior to that admission.        OT Problem List: Decreased strength;Decreased range of motion;Decreased activity tolerance;Decreased cognition      OT Treatment/Interventions: Self-care/ADL training;Therapeutic exercise;Therapeutic activities;Patient/family education;Balance training    OT Goals(Current goals can be found in the care plan section) Acute Rehab OT Goals OT Goal Formulation: Patient unable to participate in goal setting  OT Frequency: Min 1X/week   Barriers to D/C:            Co-evaluation              AM-PAC OT "6 Clicks" Daily Activity     Outcome Measure Help from another person eating meals?: Total Help from another person taking care of personal grooming?: Total Help from another person toileting, which includes using toliet, bedpan, or urinal?: Total Help from another person bathing (including washing, rinsing, drying)?: Total Help from another person to put on and taking off regular upper body clothing?: Total Help from another person to put on and taking off regular lower body clothing?: Total 6 Click Score: 6   End of Session Nurse Communication: Mobility status;Other (comment)(pt  in need of oral care, but refusing-will not open mouth)  Activity Tolerance: Other (comment)(potentially limited eval/tx 2/2 weakness or cognitive status) Patient left: in bed;with call bell/phone within reach;with bed alarm set  OT Visit Diagnosis: Muscle weakness (generalized) (M62.81);Other symptoms and signs involving cognitive function                Time: [id number] OT Time Calculation (min): 19 min Charges:  OT General Charges $OT Visit: 1 Visit OT Evaluation $OT Eval Moderate Complexity: 1 Mod OT Treatments $Self Care/Home Management : 8-22 mins  Gerrianne Scale, Gordon, OTR/L ascom [phone] 01/18/20, 11:14  AM

## 2020-01-18 NOTE — Consult Note (Signed)
[place] for Electrolyte Monitoring and Replacement   Recent Labs: Potassium (mmol/L)  Date Value  01/18/2020 3.2 (L)   Magnesium (mg/dL)  Date Value  01/17/2020 2.9 (H)   Calcium (mg/dL)  Date Value  01/18/2020 8.4 (L)   Albumin (g/dL)  Date Value  01/16/2020 2.3 (L)   Phosphorus (mg/dL)  Date Value  01/16/2020 9.2 (H)   Sodium (mmol/L)  Date Value  01/18/2020 146 (H)   Corrected Ca: 9.7 mg/dL  Assessment: 68 y.o.malewith medical history significant ofmental retardation, hypertension, hyperlipidemia, stroke, BPH, who presents with altered mental status. His renal function is poor but improving. He is not on HD due to conservative management in the setting of continuing to make urine. He has been receiving D5 for hypernatremia, which continues to improve  Goal of Therapy:  Electrolytes WNL  Plan:   Continue D5 at 100 mL per hour after adding 20 mEq/L KCl  Potassium 40 mEq x 1 PO   F/U electrolytes in am  Oswald Hillock ,PharmD, BCPS Clinical Pharmacist 01/18/2020 8:59 AM

## 2020-01-19 LAB — BASIC METABOLIC PANEL
Anion gap: 12 (ref 5–15)
BUN: 152 mg/dL — ABNORMAL HIGH (ref 8–23)
CO2: 21 mmol/L — ABNORMAL LOW (ref 22–32)
Calcium: 8.6 mg/dL — ABNORMAL LOW (ref 8.9–10.3)
Chloride: 110 mmol/L (ref 98–111)
Creatinine, Ser: 6.03 mg/dL — ABNORMAL HIGH (ref 0.61–1.24)
GFR calc Af Amer: 10 mL/min — ABNORMAL LOW (ref >60)
GFR calc non Af Amer: 9 mL/min — ABNORMAL LOW (ref >60)
Glucose, Bld: 106 mg/dL — ABNORMAL HIGH (ref 70–99)
Potassium: 3 mmol/L — ABNORMAL LOW (ref 3.5–5.1)
Sodium: 143 mmol/L (ref 135–145)

## 2020-01-19 LAB — PHOSPHORUS: Phosphorus: 4.8 mg/dL — ABNORMAL HIGH (ref 2.5–4.6)

## 2020-01-19 MED ORDER — POTASSIUM CHLORIDE 10 MEQ/100ML IV SOLN
10.0000 meq | INTRAVENOUS | Status: DC
  Filled 2020-01-19: qty 100

## 2020-01-19 MED ORDER — POTASSIUM CHLORIDE CRYS ER 20 MEQ PO TBCR
30.0000 meq | EXTENDED_RELEASE_TABLET | ORAL | Status: DC
  Filled 2020-01-19: qty 1

## 2020-01-19 MED ORDER — SODIUM CHLORIDE 0.9 % IV SOLN
INTRAVENOUS | Status: DC | PRN
  Administered 2020-01-19: 17:00:00 15 mL via INTRAVENOUS
  Administered 2020-01-28: 30 mL via INTRAVENOUS

## 2020-01-19 MED ORDER — POTASSIUM CHLORIDE 10 MEQ/100ML IV SOLN
10.0000 meq | INTRAVENOUS | Status: AC
  Administered 2020-01-19 (×4): 10 meq via INTRAVENOUS
  Filled 2020-01-19: qty 100

## 2020-01-19 NOTE — Progress Notes (Signed)
Patient is currently without IV access. Attempted to insert peripheral IV but patient swatted my hand away and would not let me insert one. Notified Dr. Manuella Ghazi who called patient's niece. Patient's niece agreed to come and encourage patient to let us insert new IV and take PO medication.  Marry Guan, RN 01/19/2020 11:19 AM

## 2020-01-19 NOTE — Progress Notes (Signed)
Patient caused one IV to come out and the other has become occluded. Patient would not allow/tolerate me attempting another IV. Order in for IV team to see if they are able to get one in.

## 2020-01-19 NOTE — Progress Notes (Signed)
New IV established. Patient tolerated fairly with niece at the bedside. IV potassium reordered and PO potassium discontinued per verbal order by Dr. Manuella Ghazi.    Marry Guan, RN  01/19/2020 4:47 PM

## 2020-01-19 NOTE — Progress Notes (Signed)
Pt will now allow me to place an IV

## 2020-01-19 NOTE — Progress Notes (Signed)
[facility], [place] 01/19/20  Subjective:  Patient awake and alert but not conversant. BUN currently down to 152 with a creatinine of 6.0. Urine output was 950 cc over the preceding 24 hours. Potassium still low at 3.0. No IV access for the patient at the moment. It appears patient declining care at times.  Objective:  Vital signs in last 24 hours:  Temp:  [97.5 F (36.4 C)-98.8 F (37.1 C)] 97.9 F (36.6 C) (01/24 0428) Pulse Rate:  [72-80] 72 (01/24 0428) Resp:  [14-20] 16 (01/24 0428) BP: (138-151)/(85-100) 138/87 (01/24 0428) SpO2:  [97 %-98 %] 98 % (01/24 0428)  Weight change:  Filed Weights   01/08/20 0950 01/11/20 0108  Weight: 85.7 kg 79.2 kg    Intake/Output:    Intake/Output Summary (Last 24 hours) at 01/19/2020 1310 Last data filed at 01/19/2020 1242 Gross per 24 hour  Intake 973.46 ml  Output 860 ml  Net 113.46 ml     Physical Exam: General:  No acute distress, chronically ill-appearing, cachectic  HEENT  moist oral mucous membranes, temporal wasting  Pulm/lungs  room air, normal breathing effort  CVS/Heart  regular rhythm  Abdomen:   Soft, nontender  Extremities:  No peripheral edema  Neurologic:  Awake, alert, nonconversant  Skin:  Warm, left arm edema blister    Basic Metabolic Panel:  Recent Labs  Lab 01/15/20 1504 01/15/20 1504 01/16/20 0326 01/16/20 0326 01/17/20 0506 01/18/20 0656 01/19/20 0417  NA 149*  --  152*  --  148* 146* 143  K 3.4*  --  3.2*  --  3.0* 3.2* 3.0*  CL 112*  --  112*  --  108 110 110  CO2 19*  --  21*  --  21* 22 21*  GLUCOSE 144*  --  132*  --  127* 129* 106*  BUN 209*  --  192*  --  191* 160* 152*  CREATININE 9.22*  --  9.13*  --  8.29* 7.11* 6.03*  CALCIUM 8.0*   < > 8.1*   < > 8.3* 8.4* 8.6*  MG  --   --   --   --  2.9*  --   --   PHOS  --   --  9.2*  --   --   --  4.8*   < > = values in this interval not displayed.     CBC: Recent Labs  Lab 01/17/20 0506  WBC 6.5   HGB 11.4*  HCT 35.9*  MCV 87.3  PLT 98*     No results found for: HEPBSAG, HEPBSAB, HEPBIGM    Microbiology:  Recent Results (from the past 240 hour(s))  MRSA PCR Screening     Status: None   Collection Time: 01/11/20  2:16 AM   Specimen: Nasopharyngeal  Result Value Ref Range Status   MRSA by PCR NEGATIVE NEGATIVE Final    Comment:        The GeneXpert MRSA Assay (FDA approved for NASAL specimens only), is one component of a comprehensive MRSA colonization surveillance program. It is not intended to diagnose MRSA infection nor to guide or monitor treatment for MRSA infections. Performed at [facility], [address] [place], [place] [address]   Respiratory Panel by RT PCR (Flu A&B, Covid) - Nasopharyngeal Swab     Status: Abnormal   Collection Time: 01/11/20  2:12 PM   Specimen: Nasopharyngeal Swab  Result Value Ref Range Status   SARS Coronavirus 2 by RT  PCR POSITIVE (A) NEGATIVE Final    Comment: RESULT CALLED TO, READ BACK BY AND VERIFIED WITH: [facility] SMITH AT [id number] 01/11/20.PMF (NOTE) SARS-CoV-2 target nucleic acids are DETECTED. SARS-CoV-2 RNA is generally detectable in upper respiratory specimens  during the acute phase of infection. Positive results are indicative of the presence of the identified virus, but do not rule out bacterial infection or co-infection with other pathogens not detected by the test. Clinical correlation with patient history and other diagnostic information is necessary to determine patient infection status. The expected result is Negative. Fact Sheet for Patients:  PinkCheek.be Fact Sheet for Healthcare Providers: GravelBags.it This test is not yet approved or cleared by the [place] FDA and  has been authorized for detection and/or diagnosis of SARS-CoV-2 by FDA under an Emergency Use Authorization (EUA).  This EUA will remain in effect (meaning this test can  be used) for  the duration of  the COVID-19 declaration under Section 564(b)(1) of the Act, 21 U.S.C. section 360bbb-3(b)(1), unless the authorization is terminated or revoked sooner.    Influenza A by PCR NEGATIVE NEGATIVE Final   Influenza B by PCR NEGATIVE NEGATIVE Final    Comment: (NOTE) The Xpert Xpress SARS-CoV-2/FLU/RSV assay is intended as an aid in  the diagnosis of influenza from Nasopharyngeal swab specimens and  should not be used as a sole basis for treatment. Nasal washings and  aspirates are unacceptable for Xpert Xpress SARS-CoV-2/FLU/RSV  testing. Fact Sheet for Patients: PinkCheek.be Fact Sheet for Healthcare Providers: GravelBags.it This test is not yet approved or cleared by the [place] FDA and  has been authorized for detection and/or diagnosis of SARS-CoV-2 by  FDA under an Emergency Use Authorization (EUA). This EUA will remain  in effect (meaning this test can be used) for the duration of the  Covid-19 declaration under Section 564(b)(1) of the Act, 21  U.S.C. section 360bbb-3(b)(1), unless the authorization is  terminated or revoked. Performed at [facility], [address] [place], [place] [address]     Coagulation Studies: No results for input(s): LABPROT, INR in the last 72 hours.  Urinalysis: No results for input(s): COLORURINE, LABSPEC, PHURINE, GLUCOSEU, HGBUR, BILIRUBINUR, KETONESUR, PROTEINUR, UROBILINOGEN, NITRITE, LEUKOCYTESUR in the last 72 hours.  Invalid input(s): APPERANCEUR    Imaging: No results found.   Medications:   . dextrose 5 % with KCl 20 mEq / L Stopped (01/19/20 0341)   . mouth rinse  15 mL Mouth Rinse q12n4p  . potassium chloride  30 mEq Oral Q4H   acetaminophen **OR** acetaminophen, ondansetron (ZOFRAN) IV  Assessment/ Plan:  69 y.o. male with history of mental retardation, hypertension, hyperlipidemia, stroke 2019, BPH, resident of  [place] home Covid virus infection 12/ 2019 admitted on 01/08/2020 for Acute kidney failure, unspecified ([place]) [N17.9] Hypernatremia [E87.0] Altered mental status, unspecified altered mental status type [R41.82]  #Acute renal failure Baseline creatinine of 0.95 in February 2020 Admission creatinine of 3.15, BUN 146 AKI likely secondary to ATN from Severe dehydration.  Recent history of COVID-19 infection in December 2020 Renal U/S neg for obstruction -Urine output was 950 cc over the preceding 24 hours.  Urine output does appear to be a bit improved.  In addition BUN down to 152 with a creatinine down to 6.0.  It appears that IV fluids were stopped as no IV access available at the moment.  Patient declining IV access.  #Severe hypernatremia secondary to dehydration  serum sodium down to 143.  However likely to  increase again if patient not drinking fluids.  # Hypokalemia Potassium down to 3.0.  He has been ordered potassium chloride orally.  Hopefully IV can be replaced.  Patient's niece - Ms [name] [phone]   LOS: 11 Umar Patmon [place] 1/24/20211:10 PM  [place], [place]  Note: This note was prepared with Dragon dictation. Any transcription errors are unintentional

## 2020-01-19 NOTE — Consult Note (Signed)
[place] for Electrolyte Monitoring and Replacement   Recent Labs: Potassium (mmol/L)  Date Value  01/19/2020 3.0 (L)   Magnesium (mg/dL)  Date Value  01/17/2020 2.9 (H)   Calcium (mg/dL)  Date Value  01/19/2020 8.6 (L)   Albumin (g/dL)  Date Value  01/16/2020 2.3 (L)   Phosphorus (mg/dL)  Date Value  01/19/2020 4.8 (H)   Sodium (mmol/L)  Date Value  01/19/2020 143    Assessment: 68 y.o.malewith medical history significant ofmental retardation, hypertension, hyperlipidemia, stroke, BPH, who presents with altered mental status. His renal function is poor but improving. He is not on HD due to conservative management in the setting of continuing to make urine. He has been receiving D5 for hypernatremia, which continues to improve  Goal of Therapy:  Electrolytes WNL  Plan:   Continue D5 at 100 mL per hour after adding 20 mEq/L KCl  Potassium 10 mEq x 4 IV   F/U electrolytes in am  Oswald Hillock ,PharmD, BCPS Clinical Pharmacist 01/19/2020 10:28 AM

## 2020-01-19 NOTE — Progress Notes (Signed)
[facility] [place] NAME: Samuel Shelton    MR#:  [id number]  DATE OF BIRTH:  02-13-1951  SUBJECTIVE:  Not interested in engaging himself for his treatment, had bedsheet covered on his face and did not want to see anyone REVIEW OF SYSTEMS:   Review of Systems  Unable to perform ROS: Medical condition  remains confused, nonverbal DRUG ALLERGIES:  No Known Allergies  VITALS:  Blood pressure 138/87, pulse 72, temperature 97.9 F (36.6 C), temperature source Oral, resp. rate 16, height 6' (1.829 m), weight 79.2 kg, SpO2 98 %.  PHYSICAL EXAMINATION:   Physical Exam-- limited exam due pt unable to participate  GENERAL:  69 y.o.-year-old patient lying in the bed with no acute distress.  Appears severely dehydrated LUNGS: Normal breath sounds bilaterally, no wheezing No use of accessory muscles of respiration.  CARDIOVASCULAR: S1, S2 normal.  NEUROLOGIC: alert, confused. Follows simple verbal commands SKIN: No obvious rash, lesion, or ulcer-- per RN  LABORATORY PANEL:  CBC Recent Labs  Lab 01/17/20 0506  WBC 6.5  HGB 11.4*  HCT 35.9*  PLT 98*    Chemistries  Recent Labs  Lab 01/15/20 0550 01/15/20 1504 01/17/20 0506 01/18/20 0656 01/19/20 0417  NA 151*   < > 148*   < > 143  K 3.5   < > 3.0*   < > 3.0*  CL 113*   < > 108   < > 110  CO2 20*   < > 21*   < > 21*  GLUCOSE 122*   < > 127*   < > 106*  BUN 226*   < > 191*   < > 152*  CREATININE 9.40*   < > 8.29*   < > 6.03*  CALCIUM 8.0*   < > 8.3*   < > 8.6*  MG  --   --  2.9*  --   --   AST 45*  --   --   --   --   ALT 31  --   --   --   --   ALKPHOS 53  --   --   --   --   BILITOT 0.9  --   --   --   --    < > = values in this interval not displayed.   Cardiac Enzymes No results for input(s): TROPONINI in the last 168 hours. RADIOLOGY:  No results found. ASSESSMENT AND PLAN:  Samuel Shelton is a 69 y.o. male with medical history significant of mental retardation,  hypertension, hyperlipidemia, stroke, BPH, who presents with altered mental status.  Severe Hypernatremia: Na 170 on admission   -ultrasound renal essentially nothing acute - Serum Sodium 143 today -Unable to continue IV fluids as he lost his IV and refusing to get new IV  -I have called patient's niece who will be coming to the hospital and have discussion with him to encourage him for overall nutrition and allow nursing to place new IV  AKI with ATN secondary to severe dehydration - S creatinine 2.96-> 6.03 today --US-renal --> unremarkable finding -Nephrology following. No plan for HD but conservative mgmt as he continues to make urine and kidney function slowly improving  Chronic mental disorder and acute metabolic encephalopathy, uremic -CT head is negative for acute intracranial abnormalities, likely due to electrolytes disturbance and worsening renal function  Hypercalcemia  resolved  Recent COVID-19 virus infection: Patient had a positive COVID-19 on 12/18, no  fever.   Overallguarded prognosis.  Family communication : Updated patient's niece today- Ms [name] [phone] she will come to the hospital at bedside and is planning to have conversation with him and encouragement for oral nutrition and letting nurses to place new IV line as he has lost his old IV, she is in agreement with current mgmt Consults : nephrology, palliative care  discharge Disposition : SNF with outpatient Palliative, then transition to long term care CODE STATUS:DNR DVT Prophylaxis scd  TOTAL TIME TAKING CARE OF THIS PATIENT: *25* minutes.  >50% time spent on counselling and coordination of care  Note: This dictation was prepared with Dragon dictation along with smaller phrase technology. Any transcriptional errors that result from this process are unintentional.  Max Sane M.D on 01/19/2020 at 12:29 PM  [facility]   CC: Primary care physician; Sofie Hartigan, MDPatient ID:  Samuel Shelton, male   DOB: October 31, 1951, 69 y.o.   MRN: [id number]

## 2020-01-20 LAB — BASIC METABOLIC PANEL
Anion gap: 12 (ref 5–15)
BUN: 122 mg/dL — ABNORMAL HIGH (ref 8–23)
CO2: 23 mmol/L (ref 22–32)
Calcium: 8.8 mg/dL — ABNORMAL LOW (ref 8.9–10.3)
Chloride: 114 mmol/L — ABNORMAL HIGH (ref 98–111)
Creatinine, Ser: 5 mg/dL — ABNORMAL HIGH (ref 0.61–1.24)
GFR calc Af Amer: 13 mL/min — ABNORMAL LOW (ref >60)
GFR calc non Af Amer: 11 mL/min — ABNORMAL LOW (ref >60)
Glucose, Bld: 122 mg/dL — ABNORMAL HIGH (ref 70–99)
Potassium: 3.9 mmol/L (ref 3.5–5.1)
Sodium: 149 mmol/L — ABNORMAL HIGH (ref 135–145)

## 2020-01-20 NOTE — Consult Note (Signed)
[place] for Electrolyte Monitoring and Replacement   Recent Labs: Potassium (mmol/L)  Date Value  01/20/2020 3.9   Magnesium (mg/dL)  Date Value  01/17/2020 2.9 (H)   Calcium (mg/dL)  Date Value  01/20/2020 8.8 (L)   Albumin (g/dL)  Date Value  01/16/2020 2.3 (L)   Phosphorus (mg/dL)  Date Value  01/19/2020 4.8 (H)   Sodium (mmol/L)  Date Value  01/20/2020 149 (H)   Corrected Ca: 10.2 mg/dL  Assessment: 68 y.o.malewith medical history significant ofmental retardation, hypertension, hyperlipidemia, stroke, BPH, who presents with altered mental status. His renal function is poor but improving. He is not on HD due to conservative management in the setting of continuing to make urine. He has been receiving D5 for hypernatremia which resolved but has increased again today.  Goal of Therapy:  Electrolytes WNL  Plan:   Continue D5 at 100 mL with 20 mEq/L KCl  F/U electrolytes in am  Dallie Piles ,PharmD, BCPS Clinical Pharmacist 01/20/2020 8:54 AM

## 2020-01-20 NOTE — TOC Progression Note (Signed)
Transition of Care [facility]) - Progression Note    Patient Details  Name: Jahrell Salasar MRN: [id number] Date of Birth: 02-09-1951  Transition [facility] CM/SW Contact  Beverly Sessions, RN Phone Number: 01/20/2020, 3:45 PM  Clinical Narrative:     [facility] is able to offer a bed  Niece update that 3 current options for discharge are: - [facility] - which she expresses may be too far away [facility] - Patient is from the facility, however niece states "you are not understanding it's not an option for him to return there, they didn't care for him" - home with home health services and outpatient palliative - Per niece she is currently in school and home is not an option   Due to patient being from LTC at [facility], his current payor is [facility], this will be active until the end of the month if patient does not return to [place].  2/1 he will convert back to Traditional Medicare and Medicaid.   This is a barrier, as most facilities are not in network with [employer].   [facility] has reached out to department leadership for guidance.  MD update  Niece requesting to speak to MD.  MD notified       Expected Discharge Plan and Services                                                 Social Determinants of Health (SDOH) Interventions    Readmission Risk Interventions No flowsheet data found.

## 2020-01-20 NOTE — Progress Notes (Signed)
[place] at [place] NAME: Samuel Shelton    MR#:  [id number]  DATE OF BIRTH:  06/14/1951  SUBJECTIVE:  Not interested in engaging himself for his treatment, had bedsheet covered on his face  REVIEW OF SYSTEMS:   Review of Systems  Unable to perform ROS: Medical condition  remains confused, nonverbal DRUG ALLERGIES:  No Known Allergies  VITALS:  Blood pressure 135/71, pulse 73, temperature 98.2 F (36.8 C), temperature source Oral, resp. rate 16, height 6' (1.829 m), weight 79.2 kg, SpO2 100 %.  PHYSICAL EXAMINATION:   Physical Exam-- limited exam due pt unable to participate  GENERAL:  69 y.o.-year-old patient lying in the bed with no acute distress.  Appears severely dehydrated LUNGS: Normal breath sounds bilaterally, no wheezing No use of accessory muscles of respiration.  CARDIOVASCULAR: S1, S2 normal.  NEUROLOGIC: alert, confused. Follows simple verbal commands SKIN: No obvious rash, lesion, or ulcer-- per RN  LABORATORY PANEL:  CBC Recent Labs  Lab 01/17/20 0506  WBC 6.5  HGB 11.4*  HCT 35.9*  PLT 98*    Chemistries  Recent Labs  Lab 01/15/20 0550 01/15/20 1504 01/17/20 0506 01/18/20 0656 01/20/20 0521  NA 151*   < > 148*   < > 149*  K 3.5   < > 3.0*   < > 3.9  CL 113*   < > 108   < > 114*  CO2 20*   < > 21*   < > 23  GLUCOSE 122*   < > 127*   < > 122*  BUN 226*   < > 191*   < > 122*  CREATININE 9.40*   < > 8.29*   < > 5.00*  CALCIUM 8.0*   < > 8.3*   < > 8.8*  MG  --   --  2.9*  --   --   AST 45*  --   --   --   --   ALT 31  --   --   --   --   ALKPHOS 53  --   --   --   --   BILITOT 0.9  --   --   --   --    < > = values in this interval not displayed.   Cardiac Enzymes No results for input(s): TROPONINI in the last 168 hours. RADIOLOGY:  No results found. ASSESSMENT AND PLAN:  Samuel Shelton is a 69 y.o. male with medical history significant of mental retardation, hypertension, hyperlipidemia,  stroke, BPH, who presents with altered mental status.  Severe Hypernatremia: Na 170 on admission   -ultrasound renal essentially nothing acute - Serum Sodium 149 today, continue intravenous D5W  AKI with ATN secondary to severe dehydration - S creatinine 2.96-> 5.0 today --US-renal --> unremarkable finding -Nephrology following.  Good urine output, improving creatinine  Chronic mental disorder and acute metabolic encephalopathy, uremic -CT head is negative for acute intracranial abnormalities, likely due to electrolytes disturbance and worsening renal function  Hypercalcemia  resolved  Recent COVID-19 virus infection: Patient had a positive COVID-19 on 12/18, no fever.   Overallguarded prognosis.  Family communication : patient's niece - Ms [name] [phone] does not want him to be placed at [facility].  She does request SNF and TOC team is working on placement Consults : nephrology, palliative care  discharge Disposition : SNF with outpatient Palliative, then transition to long term care Discharge barrier: Placement, clinical condition  CODE STATUS:DNR DVT Prophylaxis scd  TOTAL TIME TAKING CARE OF THIS PATIENT: *25* minutes.  >50% time spent on counselling and coordination of care  Note: This dictation was prepared with Dragon dictation along with smaller phrase technology. Any transcriptional errors that result from this process are unintentional.  Max Sane M.D on 01/20/2020 at 2:24 PM  [facility]   CC: Primary care physician; Sofie Hartigan, MDPatient ID: Samuel Shelton, male   DOB: 29-Jul-1951, 69 y.o.   MRN: [id number]

## 2020-01-20 NOTE — Progress Notes (Signed)
[place], [place] 01/20/20  Subjective:  Patient seen and evaluated at bedside. Still nonverbal. Does not appear to be eating very much. Breakfast at the bedside was untouched.  Objective:  Vital signs in last 24 hours:  Temp:  [98 F (36.7 C)-98.2 F (36.8 C)] 98.2 F (36.8 C) (01/24 2014) Pulse Rate:  [70-73] 73 (01/24 2014) Resp:  [14-16] 16 (01/24 2014) BP: (135-164)/(71-88) 135/71 (01/24 2014) SpO2:  [100 %] 100 % (01/24 2014)  Weight change:  Filed Weights   01/08/20 0950 01/11/20 0108  Weight: 85.7 kg 79.2 kg    Intake/Output:    Intake/Output Summary (Last 24 hours) at 01/20/2020 1212 Last data filed at 01/20/2020 0455 Gross per 24 hour  Intake 1508.55 ml  Output 1355 ml  Net 153.55 ml     Physical Exam: General:  No acute distress, chronically ill-appearing, cachectic  HEENT  moist oral mucous membranes, temporal wasting  Pulm/lungs  room air, normal breathing effort  CVS/Heart  regular rhythm  Abdomen:   Soft, nontender  Extremities:  No peripheral edema  Neurologic:  Awake, alert, nonconversant  Skin:  Warm, left arm edema blister    Basic Metabolic Panel:  Recent Labs  Lab 01/16/20 0326 01/16/20 0326 01/17/20 0506 01/17/20 0506 01/18/20 0656 01/19/20 0417 01/20/20 0521  NA 152*  --  148*  --  146* 143 149*  K 3.2*  --  3.0*  --  3.2* 3.0* 3.9  CL 112*  --  108  --  110 110 114*  CO2 21*  --  21*  --  22 21* 23  GLUCOSE 132*  --  127*  --  129* 106* 122*  BUN 192*  --  191*  --  160* 152* 122*  CREATININE 9.13*  --  8.29*  --  7.11* 6.03* 5.00*  CALCIUM 8.1*   < > 8.3*   < > 8.4* 8.6* 8.8*  MG  --   --  2.9*  --   --   --   --   PHOS 9.2*  --   --   --   --  4.8*  --    < > = values in this interval not displayed.     CBC: Recent Labs  Lab 01/17/20 0506  WBC 6.5  HGB 11.4*  HCT 35.9*  MCV 87.3  PLT 98*     No results found for: HEPBSAG, HEPBSAB, HEPBIGM    Microbiology:  Recent Results  (from the past 240 hour(s))  MRSA PCR Screening     Status: None   Collection Time: 01/11/20  2:16 AM   Specimen: Nasopharyngeal  Result Value Ref Range Status   MRSA by PCR NEGATIVE NEGATIVE Final    Comment:        The GeneXpert MRSA Assay (FDA approved for NASAL specimens only), is one component of a comprehensive MRSA colonization surveillance program. It is not intended to diagnose MRSA infection nor to guide or monitor treatment for MRSA infections. Performed at [facility], [address] [place], [place] [address]   Respiratory Panel by RT PCR (Flu A&B, Covid) - Nasopharyngeal Swab     Status: Abnormal   Collection Time: 01/11/20  2:12 PM   Specimen: Nasopharyngeal Swab  Result Value Ref Range Status   SARS Coronavirus 2 by RT PCR POSITIVE (A) NEGATIVE Final    Comment: RESULT CALLED TO, READ BACK BY AND VERIFIED WITH: [facility] SMITH AT [id number] 01/11/20.PMF (NOTE) SARS-CoV-2 target nucleic  acids are DETECTED. SARS-CoV-2 RNA is generally detectable in upper respiratory specimens  during the acute phase of infection. Positive results are indicative of the presence of the identified virus, but do not rule out bacterial infection or co-infection with other pathogens not detected by the test. Clinical correlation with patient history and other diagnostic information is necessary to determine patient infection status. The expected result is Negative. Fact Sheet for Patients:  PinkCheek.be Fact Sheet for Healthcare Providers: GravelBags.it This test is not yet approved or cleared by the [place] FDA and  has been authorized for detection and/or diagnosis of SARS-CoV-2 by FDA under an Emergency Use Authorization (EUA).  This EUA will remain in effect (meaning this test can be used) for  the duration of  the COVID-19 declaration under Section 564(b)(1) of the Act, 21 U.S.C. section 360bbb-3(b)(1), unless the  authorization is terminated or revoked sooner.    Influenza A by PCR NEGATIVE NEGATIVE Final   Influenza B by PCR NEGATIVE NEGATIVE Final    Comment: (NOTE) The Xpert Xpress SARS-CoV-2/FLU/RSV assay is intended as an aid in  the diagnosis of influenza from Nasopharyngeal swab specimens and  should not be used as a sole basis for treatment. Nasal washings and  aspirates are unacceptable for Xpert Xpress SARS-CoV-2/FLU/RSV  testing. Fact Sheet for Patients: PinkCheek.be Fact Sheet for Healthcare Providers: GravelBags.it This test is not yet approved or cleared by the [place] FDA and  has been authorized for detection and/or diagnosis of SARS-CoV-2 by  FDA under an Emergency Use Authorization (EUA). This EUA will remain  in effect (meaning this test can be used) for the duration of the  Covid-19 declaration under Section 564(b)(1) of the Act, 21  U.S.C. section 360bbb-3(b)(1), unless the authorization is  terminated or revoked. Performed at [facility], [address] [place], [place] [address]     Coagulation Studies: No results for input(s): LABPROT, INR in the last 72 hours.  Urinalysis: No results for input(s): COLORURINE, LABSPEC, PHURINE, GLUCOSEU, HGBUR, BILIRUBINUR, KETONESUR, PROTEINUR, UROBILINOGEN, NITRITE, LEUKOCYTESUR in the last 72 hours.  Invalid input(s): APPERANCEUR    Imaging: No results found.   Medications:   . sodium chloride Stopped (01/20/20 0158)  . dextrose 5 % with KCl 20 mEq / L 20 mEq (01/20/20 0651)   . mouth rinse  15 mL Mouth Rinse q12n4p   sodium chloride, acetaminophen **OR** acetaminophen, ondansetron (ZOFRAN) IV  Assessment/ Plan:  69 y.o. male with history of mental retardation, hypertension, hyperlipidemia, stroke 2019, BPH, resident of [place] home Covid virus infection 12/ 2019 admitted on 01/08/2020 for Acute kidney failure, unspecified  ([place]) [N17.9] Hypernatremia [E87.0] Altered mental status, unspecified altered mental status type [R41.82]  #Acute renal failure Baseline creatinine of 0.95 in February 2020 Admission creatinine of 3.15, BUN 146 AKI likely secondary to ATN from Severe dehydration.  Recent history of COVID-19 infection in December 2020 Renal U/S neg for obstruction -Urine output increased to 1.5 L over the preceding 24 hours.  BUN down to 122 with a creatinine of 5.  Therefore no indication for dialysis.  #Severe hypernatremia secondary to dehydration  sodium now up again to 149.  New IV has been reestablished.  Continue D5W.  # Hypokalemia Potassium 3.9 and acceptable.  Patient's niece - Ms [name] [phone]   LOS: 69 [address] [place] 1/25/202112:12 PM  [place], [place]  Note: This note was prepared with Dragon dictation. Any transcription errors are unintentional

## 2020-01-21 LAB — BASIC METABOLIC PANEL
Anion gap: 15 (ref 5–15)
BUN: 102 mg/dL — ABNORMAL HIGH (ref 8–23)
CO2: 20 mmol/L — ABNORMAL LOW (ref 22–32)
Calcium: 9.3 mg/dL (ref 8.9–10.3)
Chloride: 110 mmol/L (ref 98–111)
Creatinine, Ser: 4.58 mg/dL — ABNORMAL HIGH (ref 0.61–1.24)
GFR calc Af Amer: 14 mL/min — ABNORMAL LOW (ref >60)
GFR calc non Af Amer: 12 mL/min — ABNORMAL LOW (ref >60)
Glucose, Bld: 133 mg/dL — ABNORMAL HIGH (ref 70–99)
Potassium: 3.9 mmol/L (ref 3.5–5.1)
Sodium: 145 mmol/L (ref 135–145)

## 2020-01-21 NOTE — Progress Notes (Addendum)
[place] at [place] NAME: Samuel Shelton    MR#:  [id number]  DATE OF BIRTH:  08-18-1951  SUBJECTIVE:  Not interested in engaging himself for his treatment, continues to have bedsheet covered over his face.  Not eating much REVIEW OF SYSTEMS:   Review of Systems  Unable to perform ROS: Medical condition  remains confused, nonverbal DRUG ALLERGIES:  No Known Allergies  VITALS:  Blood pressure (!) 146/95, pulse 77, temperature (!) 97.4 F (36.3 C), temperature source Axillary, resp. rate 18, height 6' (1.829 m), weight 79.2 kg, SpO2 100 %.  PHYSICAL EXAMINATION:   Physical Exam-- limited exam due pt unable to participate  GENERAL:  69 y.o.-year-old patient lying in the bed with no acute distress.  Appears severely dehydrated LUNGS: Normal breath sounds bilaterally, no wheezing No use of accessory muscles of respiration.  CARDIOVASCULAR: S1, S2 normal.  NEUROLOGIC: alert, confused.  Nonverbal  SKIN: No obvious rash, lesion, or ulcer LABORATORY PANEL:  CBC Recent Labs  Lab 01/17/20 0506  WBC 6.5  HGB 11.4*  HCT 35.9*  PLT 98*    Chemistries  Recent Labs  Lab 01/15/20 0550 01/15/20 1504 01/17/20 0506 01/18/20 0656 01/21/20 0439  NA 151*   < > 148*   < > 145  K 3.5   < > 3.0*   < > 3.9  CL 113*   < > 108   < > 110  CO2 20*   < > 21*   < > 20*  GLUCOSE 122*   < > 127*   < > 133*  BUN 226*   < > 191*   < > 102*  CREATININE 9.40*   < > 8.29*   < > 4.58*  CALCIUM 8.0*   < > 8.3*   < > 9.3  MG  --   --  2.9*  --   --   AST 45*  --   --   --   --   ALT 31  --   --   --   --   ALKPHOS 53  --   --   --   --   BILITOT 0.9  --   --   --   --    < > = values in this interval not displayed.   Cardiac Enzymes No results for input(s): TROPONINI in the last 168 hours. RADIOLOGY:  No results found. ASSESSMENT AND PLAN:  Samuel Shelton is a 69 y.o. male with medical history significant of mental retardation, hypertension,  hyperlipidemia, stroke, BPH, who presents with altered mental status.  Severe Hypernatremia: Na 170 on admission   -ultrasound renal essentially nothing acute - Serum Sodium 145 today, continue intravenous D5W per nephrology  AKI with ATN secondary to severe dehydration - S creatinine 4.8 today --US-renal --> unremarkable finding -Nephrology following.  Good urine output  Chronic mental disorder and acute metabolic encephalopathy, uremic -CT head is negative for acute intracranial abnormalities, likely due to electrolytes disturbance and worsening renal function  Hypercalcemia  Resolved  Moderate to severe protein calorie nutrition Patient is not eating.  Does not have motivation/drive to eat We will ask nutritionist and psychiatry to evaluate and see if there are any options here   Recent COVID-19 virus infection: Patient had a positive COVID-19 on 12/18, no fever.   Overallguarded prognosis.  Will request palliative care to revisit him and have discussion with family  Family communication : I have updated  patient's niece today- Ms [name] [phone]. She continues to be upset about him not eating.  She does request SNF and TOC team may be able to get facility around [facility] area Consults : nephrology, palliative care  discharge Disposition : SNF with outpatient Palliative, then transition to long term care Discharge barrier: Placement, clinical condition, nutritional status/poor p.o. intake likely chronic CODE STATUS:DNR DVT Prophylaxis scd  TOTAL TIME TAKING CARE OF THIS PATIENT: *25* minutes.  >50% time spent on counselling and coordination of care  Note: This dictation was prepared with Dragon dictation along with smaller phrase technology. Any transcriptional errors that result from this process are unintentional.  Max Sane M.D on 01/21/2020 at 2:44 PM  [facility]   CC: Primary care physician; Sofie Hartigan, MDPatient ID: Samuel Shelton,  male   DOB: [birth year], 69 y.o.   MRN: [id number]

## 2020-01-21 NOTE — Progress Notes (Signed)
Physical Therapy Treatment Patient Details Name: Samuel Shelton MRN: [id number] DOB: 02-Oct-1951 Today's Date: 01/21/2020    History of Present Illness Pt is 32 male with PMH of hypertension, hyperlipidemia, stroke 2019, BPH. A resident of [facility] home, of note Covid virus infection 12/ 2019 admitted on 01/08/2020 for acute kidney failure, hypernatremia, AMS.    PT Comments    Patient intermittently lethargic during session. Able to open eyes and attend to PT with repetitive cueing. PT provided LUE stretching, hard to assess if patient was resistive to movement or was experiencing increased tone. Supine to sit attempted with totalA to assess patient's ability to participate, actively leaning back needing total assist to maintain position, with time pt did place feet on floor and with tactile cueing may have attempted to lean anteriorly. Returned to supine totalAx2 and repositioned, pt was able to hold his head up when in supine. The patient is currently placed on trial PT which is still appropriate, PT to follow up as able and continue to assess pt ability to participate.      Follow Up Recommendations  SNF     Equipment Recommendations  Other (comment)(TBD at next venue of care)    Recommendations for Other Services       Precautions / Restrictions Precautions Precautions: Fall Restrictions Weight Bearing Restrictions: No    Mobility  Bed Mobility Overal bed mobility: Needs Assistance Bed Mobility: Supine to Sit;Sit to Supine     Supine to sit: Total assist;+2 for physical assistance;HOB elevated Sit to supine: Total assist;+2 for physical assistance      Transfers                    Ambulation/Gait                 Stairs             Wheelchair Mobility    Modified Rankin (Stroke Patients Only)       Balance                                            Cognition Arousal/Alertness:  Awake/alert;Lethargic Behavior During Therapy: Flat affect Overall Cognitive Status: No family/caregiver present to determine baseline cognitive functioning                                 General Comments: pt non verbal throughout session      Exercises      General Comments        Pertinent Vitals/Pain Pain Assessment: Faces Faces Pain Scale: No hurt    Home Living                      Prior Function            PT Goals (current goals can now be found in the care plan section) Progress towards PT goals: Not progressing toward goals - comment    Frequency    Min 2X/week(PT trial)      PT Plan Current plan remains appropriate    Co-evaluation              AM-PAC PT "6 Clicks" Mobility   Outcome Measure  Help needed turning from your back to your side while in a flat bed without using bedrails?:  Total Help needed moving from lying on your back to sitting on the side of a flat bed without using bedrails?: Total Help needed moving to and from a bed to a chair (including a wheelchair)?: Total Help needed standing up from a chair using your arms (e.g., wheelchair or bedside chair)?: Total Help needed to walk in hospital room?: Total Help needed climbing 3-5 steps with a railing? : Total 6 Click Score: 6    End of Session   Activity Tolerance: Patient limited by lethargy;Other (comment)(limited by patient participation) Patient left: in bed;with call bell/phone within reach;with bed alarm set Nurse Communication: Mobility status PT Visit Diagnosis: Other abnormalities of gait and mobility (R26.89);Muscle weakness (generalized) (M62.81)     Time: [id number] PT Time Calculation (min) (ACUTE ONLY): 11 min  Charges:  $Therapeutic Activity: 8-22 mins                     Lieutenant Diego PT, DPT 3:22 PM,01/21/20

## 2020-01-21 NOTE — Progress Notes (Signed)
[place], [place] 01/21/20  Subjective:  Patient seen at bedside with proper PPE. Renal function continues to slowly improve. BUN down to 102 with a creatinine of 4.58. Patient is awake but nonverbal today.  Objective:  Vital signs in last 24 hours:  Temp:  [97.4 F (36.3 C)] 97.4 F (36.3 C) (01/25 2229) Pulse Rate:  [77] 77 (01/25 2229) Resp:  [18] 18 (01/25 2229) BP: (146)/(95) 146/95 (01/25 2229) SpO2:  [100 %] 100 % (01/25 2229)  Weight change:  Filed Weights   01/08/20 0950 01/11/20 0108  Weight: 85.7 kg 79.2 kg    Intake/Output:    Intake/Output Summary (Last 24 hours) at 01/21/2020 1054 Last data filed at 01/20/2020 1500 Gross per 24 hour  Intake 1121 ml  Output 600 ml  Net 521 ml     Physical Exam: General:  No acute distress, chronically ill-appearing, cachectic  HEENT  moist oral mucous membranes, temporal wasting  Pulm/lungs  room air, normal breathing effort  CVS/Heart  regular rhythm  Abdomen:   Soft, nontender  Extremities:  No peripheral edema  Neurologic:  Awake, alert, nonconversant  Skin:  Warm, left arm edema blister    Basic Metabolic Panel:  Recent Labs  Lab 01/16/20 0326 01/16/20 0326 01/17/20 0506 01/17/20 0506 01/18/20 0656 01/18/20 0656 01/19/20 0417 01/20/20 0521 01/21/20 0439  NA 152*   < > 148*  --  146*  --  143 149* 145  K 3.2*   < > 3.0*  --  3.2*  --  3.0* 3.9 3.9  CL 112*   < > 108  --  110  --  110 114* 110  CO2 21*   < > 21*  --  22  --  21* 23 20*  GLUCOSE 132*   < > 127*  --  129*  --  106* 122* 133*  BUN 192*   < > 191*  --  160*  --  152* 122* 102*  CREATININE 9.13*   < > 8.29*  --  7.11*  --  6.03* 5.00* 4.58*  CALCIUM 8.1*   < > 8.3*   < > 8.4*   < > 8.6* 8.8* 9.3  MG  --   --  2.9*  --   --   --   --   --   --   PHOS 9.2*  --   --   --   --   --  4.8*  --   --    < > = values in this interval not displayed.     CBC: Recent Labs  Lab 01/17/20 0506  WBC 6.5  HGB 11.4*   HCT 35.9*  MCV 87.3  PLT 98*     No results found for: HEPBSAG, HEPBSAB, HEPBIGM    Microbiology:  Recent Results (from the past 240 hour(s))  Respiratory Panel by RT PCR (Flu A&B, Covid) - Nasopharyngeal Swab     Status: Abnormal   Collection Time: 01/11/20  2:12 PM   Specimen: Nasopharyngeal Swab  Result Value Ref Range Status   SARS Coronavirus 2 by RT PCR POSITIVE (A) NEGATIVE Final    Comment: RESULT CALLED TO, READ BACK BY AND VERIFIED WITH: [facility] SMITH AT [id number] 01/11/20.PMF (NOTE) SARS-CoV-2 target nucleic acids are DETECTED. SARS-CoV-2 RNA is generally detectable in upper respiratory specimens  during the acute phase of infection. Positive results are indicative of the presence of the identified virus, but do not rule out bacterial infection or  co-infection with other pathogens not detected by the test. Clinical correlation with patient history and other diagnostic information is necessary to determine patient infection status. The expected result is Negative. Fact Sheet for Patients:  PinkCheek.be Fact Sheet for Healthcare Providers: GravelBags.it This test is not yet approved or cleared by the [place] FDA and  has been authorized for detection and/or diagnosis of SARS-CoV-2 by FDA under an Emergency Use Authorization (EUA).  This EUA will remain in effect (meaning this test can be used) for  the duration of  the COVID-19 declaration under Section 564(b)(1) of the Act, 21 U.S.C. section 360bbb-3(b)(1), unless the authorization is terminated or revoked sooner.    Influenza A by PCR NEGATIVE NEGATIVE Final   Influenza B by PCR NEGATIVE NEGATIVE Final    Comment: (NOTE) The Xpert Xpress SARS-CoV-2/FLU/RSV assay is intended as an aid in  the diagnosis of influenza from Nasopharyngeal swab specimens and  should not be used as a sole basis for treatment. Nasal washings and  aspirates are unacceptable for  Xpert Xpress SARS-CoV-2/FLU/RSV  testing. Fact Sheet for Patients: PinkCheek.be Fact Sheet for Healthcare Providers: GravelBags.it This test is not yet approved or cleared by the [place] FDA and  has been authorized for detection and/or diagnosis of SARS-CoV-2 by  FDA under an Emergency Use Authorization (EUA). This EUA will remain  in effect (meaning this test can be used) for the duration of the  Covid-19 declaration under Section 564(b)(1) of the Act, 21  U.S.C. section 360bbb-3(b)(1), unless the authorization is  terminated or revoked. Performed at [facility], [address] [place], [place] [address]     Coagulation Studies: No results for input(s): LABPROT, INR in the last 72 hours.  Urinalysis: No results for input(s): COLORURINE, LABSPEC, PHURINE, GLUCOSEU, HGBUR, BILIRUBINUR, KETONESUR, PROTEINUR, UROBILINOGEN, NITRITE, LEUKOCYTESUR in the last 72 hours.  Invalid input(s): APPERANCEUR    Imaging: No results found.   Medications:   . sodium chloride Stopped (01/20/20 0158)  . dextrose 5 % with KCl 20 mEq / L 20 mEq (01/20/20 1814)   . mouth rinse  15 mL Mouth Rinse q12n4p   sodium chloride, acetaminophen **OR** acetaminophen, ondansetron (ZOFRAN) IV  Assessment/ Plan:  69 y.o. male with history of mental retardation, hypertension, hyperlipidemia, stroke 2019, BPH, resident of [facility] home Covid virus infection 12/ 2019 admitted on 01/08/2020 for Acute kidney failure, unspecified ([place]) [N17.9] Hypernatremia [E87.0] Altered mental status, unspecified altered mental status type [R41.82]  #Acute renal failure Baseline creatinine of 0.95 in February 2020 Admission creatinine of 3.15, BUN 146 AKI likely secondary to ATN from Severe dehydration.  Recent history of COVID-19 infection in December 2020 Renal U/S neg for obstruction -Urine output yesterday was recorded as 600 cc.   BUN down to 102 with a creatinine of 4.58.  Continue the patient on D5W.  #Severe hypernatremia secondary to dehydration  serum sodium now down to 145.  Maintain the patient on D5W for 1 additional day.  # Hypokalemia Potassium remains within the normal range at 3.9.  However suspect that potassium will likely drop once IV fluids stopped as the patient does not appear to be eating a significant amount.  Patient's niece - Ms [name] [phone]   LOS: 25 Said Rueb McClain 1/26/202110:54 AM  [place], [place]  Note: This note was prepared with Dragon dictation. Any transcription errors are unintentional

## 2020-01-21 NOTE — Progress Notes (Signed)
PT Cancellation Note  Patient Details Name: Samuel Shelton MRN: [id number] DOB: August 17, 1951   Cancelled Treatment:    Reason Eval/Treat Not Completed: Other (comment)(Treatment attempted. When preparing to attempt to mobilize patient to EOB, BM noted. Pt repositioned and CNA notified, will re-attempt as able.)  Lieutenant Diego PT, DPT 1:19 PM,01/21/20

## 2020-01-21 NOTE — Consult Note (Signed)
[place] for Electrolyte Monitoring and Replacement   Recent Labs: Potassium (mmol/L)  Date Value  01/21/2020 3.9   Magnesium (mg/dL)  Date Value  01/17/2020 2.9 (H)   Calcium (mg/dL)  Date Value  01/21/2020 9.3   Albumin (g/dL)  Date Value  01/16/2020 2.3 (L)   Phosphorus (mg/dL)  Date Value  01/19/2020 4.8 (H)   Sodium (mmol/L)  Date Value  01/21/2020 145   Corrected Ca: 10.2 mg/dL  Assessment: 68 y.o.malewith medical history significant ofmental retardation, hypertension, hyperlipidemia, stroke, BPH, who presents with altered mental status. His renal function is poor but improving. He is not on HD due to conservative management in the setting of continuing to make urine. He has been receiving D5 for hypernatremia which is much improved.  Goal of Therapy:  Electrolytes WNL  Plan:   Continue D5 at 100 mL with 20 mEq/L KCl  F/U electrolytes in am  Dallie Piles ,PharmD, BCPS Clinical Pharmacist 01/21/2020 1:22 PM

## 2020-01-21 NOTE — Progress Notes (Signed)
Occupational Therapy Treatment Patient Details Name: Samuel Shelton MRN: [id number] DOB: December 21, 1951 Today's Date: 01/21/2020    History of present illness Pt is 43 male with PMH of hypertension, hyperlipidemia, stroke 2019, BPH. A resident of [facility] home, of note Covid virus infection 12/ 2019 admitted on 01/08/2020 for acute kidney failure, hypernatremia, AMS.   OT comments  Pt seen for OT tx this date to f/u re: self care participation and ADL mobility tolerance. OT makes several attempts and employs different modes of stimuli (tactile, auditory, position change with elevating HOB) to engage pt in either fxl ADL or mobility. Pt does appears to very minimally engage with OT this date. OT attempts to encourage gentle PROM of UEs, but feels resistance from pt when attempting, yet later notes that pt does at least have some ROM when he reaches to touch the top of his head. In addition, OT attempts to encourage pt participation in bed mobility with pt not appearing to contribute to mobilizing LEs toward EOB or attempting to lift trunk. In addition, OT attempts to engage pt in taking bite of several different items from his food tray to no avail as pt tightens lips and will not sustain holding any of the utensils. Will continue to assess for potential to progress pt and appropriateness of therapy.    Follow Up Recommendations  SNF    Equipment Recommendations  Other (comment)(defer)    Recommendations for Other Services      Precautions / Restrictions Precautions Precautions: Fall Restrictions Weight Bearing Restrictions: No       Mobility Bed Mobility Overal bed mobility: Needs Assistance Bed Mobility: Supine to Sit;Sit to Supine     Supine to sit: Total assist;+2 for physical assistance;HOB elevated Sit to supine: Total assist;+2 for physical assistance   General bed mobility comments: OT attempts to engage pt in sup to sit transition, pt tenses up and appears to  resist participation. OT attempts to engage pt in rolling to no avail as well, pt holds UEs closer to body and does not appear to participate in positioning to mobilize (bend knees/used UE to grab bed rail) in bed on any level.  Transfers                      Balance                                           ADL either performed or assessed with clinical judgement   ADL                                         General ADL Comments: Again this date, pt is TOTAL A for all self care assessed. Pt does not appear to attempt to engage in UB g/h tasks at bed level that OT attempts with pt including washing face, oral care, or attempting to take bite of applesauce from breakfast tray.     Vision   Additional Comments: appears to track moderately.   Perception     Praxis      Cognition Arousal/Alertness: Awake/alert;Lethargic Behavior During Therapy: Flat affect Overall Cognitive Status: No family/caregiver present to determine baseline cognitive functioning  General Comments: pt somewhat communicates with head movements-nodding/shaking but does not speak throughout session and does not assist in making needs known without yes/no options provided.        Exercises     Shoulder Instructions       General Comments      Pertinent Vitals/ Pain       Pain Assessment: Faces Faces Pain Scale: No hurt  Home Living                                          Prior Functioning/Environment              Frequency  Min 1X/week        Progress Toward Goals  OT Goals(current goals can now be found in the care plan section)  Progress towards OT goals: OT to reassess next treatment  Acute Rehab OT Goals OT Goal Formulation: Patient unable to participate in goal setting  Plan Discharge plan remains appropriate    Co-evaluation                 AM-PAC OT "6  Clicks" Daily Activity     Outcome Measure   Help from another person eating meals?: Total Help from another person taking care of personal grooming?: Total Help from another person toileting, which includes using toliet, bedpan, or urinal?: Total Help from another person bathing (including washing, rinsing, drying)?: Total Help from another person to put on and taking off regular upper body clothing?: Total Help from another person to put on and taking off regular lower body clothing?: Total 6 Click Score: 6    End of Session    OT Visit Diagnosis: Muscle weakness (generalized) (M62.81);Other symptoms and signs involving cognitive function   Activity Tolerance     Patient Left in bed;with call bell/phone within reach;with bed alarm set   Nurse Communication          Time: [id number] OT Time Calculation (min): 11 min  Charges: OT General Charges $OT Visit: 1 Visit OT Treatments $Therapeutic Activity: 8-22 mins  Gerrianne Scale, [place], OTR/L ascom [phone] 01/21/20, 4:15 PM

## 2020-01-22 LAB — RENAL FUNCTION PANEL
Albumin: 2.6 g/dL — ABNORMAL LOW (ref 3.5–5.0)
Anion gap: 12 (ref 5–15)
BUN: 108 mg/dL — ABNORMAL HIGH (ref 8–23)
CO2: 18 mmol/L — ABNORMAL LOW (ref 22–32)
Calcium: 9 mg/dL (ref 8.9–10.3)
Chloride: 109 mmol/L (ref 98–111)
Creatinine, Ser: 6.23 mg/dL — ABNORMAL HIGH (ref 0.61–1.24)
GFR calc Af Amer: 10 mL/min — ABNORMAL LOW (ref >60)
GFR calc non Af Amer: 8 mL/min — ABNORMAL LOW (ref >60)
Glucose, Bld: 137 mg/dL — ABNORMAL HIGH (ref 70–99)
Phosphorus: 2.9 mg/dL (ref 2.5–4.6)
Potassium: 4.6 mmol/L (ref 3.5–5.1)
Sodium: 139 mmol/L (ref 135–145)

## 2020-01-22 MED ORDER — DEXTROSE 5 % IV SOLN: INTRAVENOUS | Status: AC

## 2020-01-22 NOTE — Progress Notes (Signed)
[facility], [place] 01/22/20  Subjective:  Urine output yesterday was documented as 350 cc however nursing advises that this was inaccurate as urinary collection device was not fully secured. However renal parameters have worsened as BUN is up to 108 with a creatinine of 6.23. Patient remains on D5W.  Objective:  Vital signs in last 24 hours:  Temp:  [98.2 F (36.8 C)-100.1 F (37.8 C)] 98.2 F (36.8 C) (01/27 0505) Pulse Rate:  [103] 103 (01/27 0505) Resp:  [20] 20 (01/27 0505) BP: (125-143)/(91-97) 142/96 (01/27 0505) SpO2:  [95 %-99 %] 97 % (01/27 0505)  Weight change:  Filed Weights   01/08/20 0950 01/11/20 0108  Weight: 85.7 kg 79.2 kg    Intake/Output:    Intake/Output Summary (Last 24 hours) at 01/22/2020 1236 Last data filed at 01/22/2020 0602 Gross per 24 hour  Intake 3001.9 ml  Output 350 ml  Net 2651.9 ml     Physical Exam: General:  No acute distress, chronically ill-appearing, cachectic  HEENT  moist oral mucous membranes, temporal wasting  Pulm/lungs  room air, normal breathing effort  CVS/Heart  regular rhythm  Abdomen:   Soft, nontender  Extremities:  No peripheral edema  Neurologic:  Awake, alert, nonconversant  Skin:  Warm, left arm edema blister    Basic Metabolic Panel:  Recent Labs  Lab 01/16/20 0326 01/16/20 0326 01/17/20 0506 01/17/20 0506 01/18/20 0656 01/18/20 0656 01/19/20 0417 01/19/20 0417 01/20/20 0521 01/21/20 0439 01/22/20 0704  NA 152*   < > 148*   < > 146*  --  143  --  149* 145 139  K 3.2*   < > 3.0*   < > 3.2*  --  3.0*  --  3.9 3.9 4.6  CL 112*   < > 108   < > 110  --  110  --  114* 110 109  CO2 21*   < > 21*   < > 22  --  21*  --  23 20* 18*  GLUCOSE 132*   < > 127*   < > 129*  --  106*  --  122* 133* 137*  BUN 192*   < > 191*   < > 160*  --  152*  --  122* 102* 108*  CREATININE 9.13*   < > 8.29*   < > 7.11*  --  6.03*  --  5.00* 4.58* 6.23*  CALCIUM 8.1*   < > 8.3*   < > 8.4*   < >  8.6*   < > 8.8* 9.3 9.0  MG  --   --  2.9*  --   --   --   --   --   --   --   --   PHOS 9.2*  --   --   --   --   --  4.8*  --   --   --  2.9   < > = values in this interval not displayed.     CBC: Recent Labs  Lab 01/17/20 0506  WBC 6.5  HGB 11.4*  HCT 35.9*  MCV 87.3  PLT 98*     No results found for: HEPBSAG, HEPBSAB, HEPBIGM    Microbiology:  No results found for this or any previous visit (from the past 240 hour(s)).  Coagulation Studies: No results for input(s): LABPROT, INR in the last 72 hours.  Urinalysis: No results for input(s): COLORURINE, LABSPEC, [place], [place], [place], [place], [place], [place], [place],  NITRITE, LEUKOCYTESUR in the last 72 hours.  Invalid input(s): APPERANCEUR    Imaging: No results found.   Medications:   . sodium chloride Stopped (01/20/20 0158)  . dextrose     . mouth rinse  15 mL Mouth Rinse q12n4p   sodium chloride, acetaminophen **OR** acetaminophen, ondansetron (ZOFRAN) IV  Assessment/ Plan:  69 y.o. male with history of mental retardation, hypertension, hyperlipidemia, stroke 2019, BPH, resident of [facility] home Covid virus infection 12/ 2019 admitted on 01/08/2020 for Acute kidney failure, unspecified ([place]) [N17.9] Hypernatremia [E87.0] Altered mental status, unspecified altered mental status type [R41.82]  #Acute renal failure Baseline creatinine of 0.95 in February 2020 Admission creatinine of 3.15, BUN 146 AKI likely secondary to ATN from Severe dehydration.  Recent history of COVID-19 infection in December 2020 Renal U/S neg for obstruction -Urine output collected yesterday was not accurate per nursing.  Urinary collection device was not fully secured.  Renal function also worse today with a BUN of 108 and creatinine of 6.23.  Dialysis would be difficult to perform in this patient as he is pulled out multiple IVs in the past.  Patient has not participated in his care as well.   Recommend further evaluation through palliative care.  #Severe hypernatremia secondary to dehydration  serum sodium currently 139.  # Hypokalemia Potassium improved to 4.6.  We will continue to monitor trend.  Patient's niece - Ms [name] [phone]   LOS: 19 Samuel Shelton [place] 1/27/202112:36 PM  Jefferson Davis, [place]  Note: This note was prepared with Dragon dictation. Any transcription errors are unintentional

## 2020-01-22 NOTE — Progress Notes (Signed)
Speech Language Pathology Treatment: Dysphagia  Patient Details Name: Samuel Shelton MRN: [id number] DOB: 14-Oct-1951 Today's Date: 01/22/2020 Time: [id number] SLP Time Calculation (min) (ACUTE ONLY): 45 min  Assessment / Plan / Recommendation Clinical Impression  Pt continues to present w/ oropharyngeal phase dysphagia significantly impacted by his declined Cognitive/Mental status and overall declined engagement/interest in presentation of food/liquids. This results in increased risk for choking/aspiration w/ any oral intake. Today, pt exhibited declined attention/participation for po tasks and reduced oral awareness of stimulation of bolus material placed orally in mouth. Continued oral phase deficits noted as at first evaluation; oral holding. There is significant concern for pharyngeal phase deficits w/ current presentation.   Recommend initiate NPO status at this time w/ Frequent oral care for hygiene and stimulation of swallowing; aspiration precautions. Pt's Cognitive/mental status presentation w/ decreased awareness w/ po trials is not safe for oral intake, nor will he be able to meet his nutritional needs orally -- this was discussed by MD in order to talk w/ family re: alternative means of feeding; GOC. ST services will f/u next 1-2 days w/ ongoing assessment of pt's status and swallow function and safety w/ oral intake for re-initiation of oral diet IF appropriate. MD/NSG updated; consulted w/ Palliative Care also.   Pt presents w/ oropharyngeal phase dysphagia significantly impacted by his declined Cognitive/Mental status resulting in increased risk for choking/aspiration w/ any oral intake currently. Pt does have a baseline of MR. Pt often demonstrated turning away from oral stim of oral care(swabs at lips/mouth), and pressed his lips together tightly NOT to immediately accept po's presentation at lips. When encouraged w/ pinched straw boluses of thin and Nectar liquids, then small tsp amount  of puree, he exhibited significant oral phase deficits c/b declined attention to task, reduced oral awareness of stimulation of bolus material placed anteriorly/orally in mouth, and oral holding. Pt did not demonstated any significant lingual or oral movements for bolus management, or A-P transfer for swallowing. Oral holding continued despite max. verbal/tactile stimulation around mouth by SLP. Bolus material of puree was removed from the oral cavity; residue from liquid trials was suspected to have moved into the pharynx. No immediate pharyngeal swallow was appreciated during this assessment, HOWEVER, noted delayed throat clearing and coughing post pinched straw trials of liquids. No further trials attempted d/t pt's presentation and increased risk for aspiration. Oral Care attempted at end of session d/t sticky secretions noted orally when he yawned x1; pt bit on the sponge swabs preventing SLP's use of swabs.       HPI HPI: Pt is a 69 y.o. male with medical history significant of mental retardation lives in a group home, hypertension, hyperlipidemia, stroke, BPH, who presents with altered mental status. Per report, pt was noted to have worsening mental status recently and has decreased oral intake and decreased responsiveness than baseline. Pt opens his eyes spontaneously, and was moving face mask off his face. Pt had positive Covid 19 test on 12/13/19. Per EMS, pt was noted to have oxygen sat 65% on room air. On arrival pt on face mask at 15L oxygen sat 96%, but then oxygen saturation improved to 94-99% on room air in ED. Pt admitted w/ Dehydration and Hypernatremia: Na 170.  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  CXR: "Prominent bilateral interstitial lung markings which could reflect edema versus atypical/viral infection"; Head CT: "Mild diffuse cortical atrophy.  Mild chronic ischemic white  matter disease. No acute intracranial abnormality seen".  Pt had a previous BSE in 01/2019 w/ noted decreased R facial tone.      SLP Plan  Continue with current plan of care;Consult other service (comment)(Palliative Care consult ongoing)       Recommendations  Diet recommendations: NPO Medication Administration: Via alternative means                General recommendations: (Palliative Care) Oral Care Recommendations: Oral care QID;Staff/trained caregiver to provide oral care Follow up Recommendations: (TBD) SLP Visit Diagnosis: Dysphagia, oropharyngeal phase (R13.12)(significant Cognitive decline; refusal of oral intake) Plan: Continue with current plan of care;Consult other service (comment)(Palliative Care consult ongoing)       [place], MS, CCC-SLP Nolan Lasser 01/22/2020, 1:46 PM

## 2020-01-22 NOTE — Progress Notes (Signed)
[facility] [place] NAME: Samuel Shelton    MR#:  [id number]  DATE OF BIRTH:  [birth year]  SUBJECTIVE:  Not interested in engaging himself for his treatment this morning speech therapist tried to feed patient-- held juice in his mouth--- pushes her hand away when offered food. Patient is not participating in physical therapy and occupational therapy  REVIEW OF SYSTEMS:   Review of Systems  Unable to perform ROS: Medical condition  remains confused, nonverbal DRUG ALLERGIES:  No Known Allergies  VITALS:  Blood pressure (!) 142/96, pulse (!) 103, temperature 98.2 F (36.8 C), temperature source Oral, resp. rate 20, height 6' (1.829 m), weight 79.2 kg, SpO2 97 %.  PHYSICAL EXAMINATION:   Physical Exam-- limited exam due pt unable to participate  GENERAL:  69 y.o.-year-old patient lying in the bed with no acute distress.  Appears severely dehydrated LUNGS: Normal breath sounds bilaterally, no wheezing No use of accessory muscles of respiration.  CARDIOVASCULAR: S1, S2 normal.  NEUROLOGIC: confused.  Nonverbal  SKIN: No obvious rash, lesion, or ulcer LABORATORY PANEL:  CBC Recent Labs  Lab 01/17/20 0506  WBC 6.5  HGB 11.4*  HCT 35.9*  PLT 98*    Chemistries  Recent Labs  Lab 01/17/20 0506 01/18/20 0656 01/22/20 0704  NA 148*   < > 139  K 3.0*   < > 4.6  CL 108   < > 109  CO2 21*   < > 18*  GLUCOSE 127*   < > 137*  BUN 191*   < > 108*  CREATININE 8.29*   < > 6.23*  CALCIUM 8.3*   < > 9.0  MG 2.9*  --   --    < > = values in this interval not displayed.   Cardiac Enzymes No results for input(s): TROPONINI in the last 168 hours. RADIOLOGY:  No results found. ASSESSMENT AND PLAN:  Samuel Shelton is a 69 y.o. male with medical history significant of mental retardation, hypertension, hyperlipidemia, stroke, BPH, who presents with altered mental status.  Severe Hypernatremia: Na 170 on admission   -ultrasound renal  essentially nothing acute - Serum Sodium 145 today, continue intravenous D5W per nephrology--?how long  AKI with ATN secondary to severe dehydration - S creatinine worsened to 6.2 --US-renal --> unremarkable finding -Nephrology following.  Good urine output  Chronic mental disorder and acute metabolic encephalopathy, uremic -CT head is negative for acute intracranial abnormalities, likely due to electrolytes disturbance and worsening renal function -psychiatry to see patient. Discussed with Dr. Claris Gower  Hypercalcemia  Resolved  Moderate to severe protein calorie nutrition Patient is not eating.  Does not have motivation/drive to eat  Recent COVID-19 virus infection: Patient had a positive COVID-19 on 12/18, no fever.   Overallguarded prognosis.  Will request palliative care to revisit him and have discussion with family  Family communication : plan to speak with patient's niece today- Ms [name] [phone]. With dr Holley Raring later today -Consults : nephrology, palliative care  discharge Disposition : SNF with outpatient Palliative, then transition to long term care Discharge barrier: Placement, clinical condition, nutritional status/poor p.o. intake likely chronic -see TOC's note from 1/27 CODE STATUS:DNR DVT Prophylaxis scd  TOTAL TIME TAKING CARE OF THIS PATIENT: *25* minutes.  >50% time spent on counselling and coordination of care  Note: This dictation was prepared with Dragon dictation along with smaller phrase technology. Any transcriptional errors that result from this process are unintentional.  Samuel Shelton  Posey Pronto M.D on 01/22/2020 at 12:24 PM  [facility]   CC: Primary care physician; Sofie Hartigan, MDPatient ID: Samuel Shelton, male   DOB: 1951-12-01, 69 y.o.   MRN: [id number]

## 2020-01-22 NOTE — Consult Note (Signed)
  69 year old male with multiple medical comorbidities presented to the ED found to have AKI with ATN as well as hypernatremia and severe malnutrition.  Psychiatry was consulted due to patient's lack of ability to eat or participate in treatment.  Upon evaluation patient was sitting up in bed with eyes open.  Multiple calls of his name gentle touch is on his wrists were unsuccessful in getting the patient's attention or causing him to make any meaningful communication with Probation officer.  Chart review shows that patient's condition is currently critical with multiple severe medical issues at this time.  The patient's niece, Ms. Jolayne Haines was called at [phone] to obtain further history and to discuss case.  Ms. Laurance Flatten states that patient does have a history of mental retardation that has gotten worse over the years however he was verbal and self-sufficient.  She goes on to say that he worked for a [employer] for approximately 30 years.  She states that she last spoke to him on January 6 at the facility when he said to her "I love you".  Ms. Laurance Flatten feels like the facility the patient was at misled and did not update her on how serious his condition was becoming.  She expressed some frustrations regarding this.  Ms. Laurance Flatten reiterates that she was surprised to learn how sick the patient became so fast.  It was discussed with Ms. Laurance Flatten how continued aggressive treatments would be unlikely to be successful.  She understood this information and stated that she is planning to come by with family tomorrow to discuss further.  She was thankful for the conversation.   Plan: -Involve palliative care in family discussion of goals for this patient  -No psychiatric medication indicated at this time

## 2020-01-22 NOTE — Progress Notes (Signed)
[name], male   DOB: August 29, 1951, 69 y.o.   MRN: [id number] Dt lateef and I did a conference call with MS moore. Dr Holley Raring explained pt's condition including pt not eating/drinking and participating  PT, does not take meds and has a long term poor prognosis. Niece understood that giving fluids is not going to hlep in the long urn and despite doing that his renal function is worse today.  She wants to discuss with other family. We told her will touch base with her tomorrow

## 2020-01-22 NOTE — Progress Notes (Addendum)
The patient refused oral intake when offered and provided. The patient does not open his mouth when provided with meals or meds. Speech therapy has seen the patient. The patient is currently NPO.

## 2020-01-22 NOTE — Progress Notes (Signed)
Daily Progress Note   Patient Name: Samuel Shelton       Date: 01/22/2020 DOB: [birth year]  Age: 69 y.o. MRN#: [id number] Attending Physician: Fritzi Mandes, MD Primary Care Physician: Sofie Hartigan, MD Admit Date: 01/08/2020  Reason for Consultation/Follow-up: Establishing goals of care  Subjective: Patient does not speak to me or acknowledge my presence. No PO intake. Patient now made NPO d/t concerns of aspiration - unable to meet his nutritional need orally. No family at bedside.   Length of Stay: 14  Current Medications: Scheduled Meds:  . mouth rinse  15 mL Mouth Rinse q12n4p    Continuous Infusions: . sodium chloride Stopped (01/20/20 0158)  . dextrose 5 % with KCl 20 mEq / L 20 mEq (01/22/20 0500)    PRN Meds: sodium chloride, acetaminophen **OR** acetaminophen, ondansetron (ZOFRAN) IV  Physical Exam Constitutional:      General: He is not in acute distress.    Comments: Does not respond to verbal stimulation though appears awake and alert  HENT:     Head: Normocephalic and atraumatic.  Cardiovascular:     Rate and Rhythm: Normal rate and regular rhythm.  Pulmonary:     Effort: Pulmonary effort is normal.  Musculoskeletal:     Right lower leg: No edema.     Left lower leg: No edema.  Skin:    General: Skin is warm and dry.  Neurological:     Mental Status: He is alert. He is disoriented.             Vital Signs: BP (!) 142/96 (BP Location: Left Leg)   Pulse (!) 103   Temp 98.2 F (36.8 C) (Oral)   Resp 20   Ht 6' (1.829 m)   Wt 79.2 kg   SpO2 97%   BMI 23.68 kg/m  SpO2: SpO2: 97 % O2 Device: O2 Device: Room Air O2 Flow Rate:    Intake/output summary:   Intake/Output Summary (Last 24 hours) at 01/22/2020 1054 Last data filed at 01/22/2020 0602 Gross per 24  hour  Intake 3001.9 ml  Output 350 ml  Net 2651.9 ml   LBM: Last BM Date: 01/21/20 Baseline Weight: Weight: 85.7 kg Most recent weight: Weight: 79.2 kg       Palliative Assessment/Data: PPS 20%    Flowsheet Rows     Most Recent Value  Intake Tab  Referral Department  Hospitalist  Unit at Time of Referral  Med/Surg Unit  Palliative Care Primary Diagnosis  Sepsis/Infectious Disease  Date Notified  01/09/20  Palliative Care Type  New Palliative care  Reason for referral  Clarify Goals of Care  Date of Admission  01/08/20  Date first seen by Palliative Care  01/10/20  # of days Palliative referral response time  1 Day(s)  # of days IP prior to Palliative referral  1  Clinical Assessment  Psychosocial & Spiritual Assessment  Palliative Care Outcomes      Patient Active Problem List   Diagnosis Date Noted  . DNR (do not resuscitate) discussion   . Hypernatremia 01/08/2020  . Hypercalcemia 01/08/2020  . Acute metabolic encephalopathy [id number]  . COVID-19 virus infection 01/08/2020  . AKI (acute kidney injury) ([place])  01/08/2020  . Dehydration 01/08/2020  . Elevated troponin 01/08/2020  . Lactic acidosis 01/08/2020  . Altered mental status   . Hyperlipidemia 01/29/2019  . Family hx-stroke 01/29/2019  . BPH (benign prostatic hyperplasia) 01/29/2019  . 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 Edema leg 08/20/2014  . Chronic mental disorder 08/20/2014  . HTN (hypertension) 08/20/2014  . Intellectual disability 08/20/2014    Palliative Care Assessment & Plan   HPI: Palliative Care consult requested for goals of care in this 68 y.o. male with multiple medical problems including mental retardation, hypertension, hyperlipidemia, stroke (expressive aphasia), and BPH. [name] presented to ED from [facility] with complaints of altered mental status. On admission it was reported patient had decreased responsiveness and poor oral nutrition. Patient was COVID-19 positive on  12/13/19. During his ED work-up NA 170,  AKI with creatinine 3.15, BUN 146, potassium 4.2.Chest x-ray showed a bilateral interstitial marking withoutobvious infiltration. CT head is negative for acute intracranial abnormalities. Since admission patient continues with poor po intake, gazed stare, and unresponsive. Worsening creatinine. PMT asked to reengage 1/26.  Assessment: Patient unable to tolerate PO intake, high risk for aspiration. Kidney function worsening despite fluid resuscitation. Does not respond to me though he does appear alert.   Meeting held earlier between Dr. Posey Pronto, Dr. Holley Raring, and patient's niece. Niece asked for time to discuss decisions with other family members. Per Dr. Serita Grit request, will hold off on calling niece at this time.   Recommendations/Plan:  Will follow peripherally and reengage as requested by attending  Code Status:  DNR  Prognosis:   < 2 weeks is possible if continues to refuse all PO intake  Discharge Planning:  likely eligible for hospice facility if aligns with family's wishes  Care plan was discussed with Dr. Posey Pronto  Thank you for allowing the Palliative Medicine Team to assist in the care of this patient.   Total Time 15 minutes Prolonged Time Billed  no       Greater than 50%  of this time was spent counseling and coordinating care related to the above assessment and plan.  Juel Burrow, DNP, [facility] Palliative Medicine Team Team Phone # [phone]  Pager [phone]

## 2020-01-22 NOTE — Progress Notes (Signed)
Initial Nutrition Assessment  DOCUMENTATION CODES:   Not applicable  INTERVENTION:  Monitor for diet advancement and provide nutrition supplements as appropriate.   Consider appetite stimulant   Patient is at risk for refeeding with oral intake, monitor magnesium, potassium, and phosphorus,MD to replete as needed  NUTRITION DIAGNOSIS:   Inadequate oral intake related to lethargy/confusion(acute metabolic encephalopathy; chronic mental illness) as evidenced by per patient/family report(per chart, pt without motivation to eat, 0% po x 1 documented meal).   GOAL:   Patient will meet greater than or equal to 90% of their needs   MONITOR:   Diet advancement, I & O's, Labs, Weight trends, Skin  REASON FOR ASSESSMENT:   Consult Poor PO, Assessment of nutrition requirement/status  ASSESSMENT:  RD working remotely.  69 year old male with past medical history of chronic mental illness, mental retardation, chronic bilateral LE edema, HTN, HLD, BPH, nonverbal s/p stroke in 2019, and positive Covid 19 test on 12/18. Patient presented from SNF with worsening mental status, decreased oral intake and decreased responsiveness from baseline. Upon EMS arrival, pt noted sats 65% on room air and placed on face mask at 15 L, pt saturation improved to 94-99% on room air in ED. CXR showed bilateral interstitial marking without obvious infiltration, CT head negative for acute abnormalities, Na 170 and pt admitted with hypernatremia and acute metabolic encephalopathy.  1/26- Per notes, patient with overall guarded prognosis, PC requested to revisit and have discussion with family. Pt does not have motivation/drive to eat. OT noted attempts to engage pt in taking bites of items from food tray, pt reported to tighten lips and will not sustain holding utensils.  RD consulted for poor po intake and psychiatry consult pending for evaluation.   Suspect pt would meet criteria for severe malnutrition in the  context of chronic disease given severe wt loss noted below as well as reported ongoing poor oral intake per chart review. Unable to identify at this time. Currently, pt is NPO with aspiration precautions, previously on dysphagia 1 with thin liquids and 0% po intake x 1 documented meal this admission. RD to monitor for diet advancement, goals of care, and possible SLP evaluation for diet recommendations. Will provide supplements as appropriate to diet order.   Mild pitting BLE edema noted per RN flowsheet.  Admit wt 174.24 lbs No new weights since admission, recommend obtaining current wt to assess current status. Per history, weights have been trending down over the past year, noted 21.34 lb (10.9%) wt loss in the past 4 months which is severe for time frame.   Medications reviewed Labs: BG 137,133,122 x 24 hrs, BUN 108 (H), Cr 6.23 (H) NUTRITION - FOCUSED PHYSICAL EXAM: Unable to complete at this time, RD working remotely.    Diet Order:   Diet Order            Diet NPO time specified  Diet effective now              EDUCATION NEEDS:   Not appropriate for education at this time  Skin:  Skin Assessment: Reviewed RN Assessment(PI;left heel)  Last BM:  1/26 (type 6)  Height:   Ht Readings from Last 1 Encounters:  01/11/20 6' (1.829 m)    Weight:   Wt Readings from Last 1 Encounters:  01/11/20 79.2 kg    Ideal Body Weight:  80.9 kg  BMI:  Body mass index is 23.68 kg/m.  Estimated Nutritional Needs:   Kcal:  1900-2100  Protein:  95-105  Fluid:  >/= 1.8 L/day   Lajuan Lines, RD, LDN Clinical Nutrition Office Telephone [phone] After Hours/Weekend Pager: [phone]

## 2020-01-22 NOTE — Consult Note (Signed)
[place] for Electrolyte Monitoring and Replacement   Recent Labs: Potassium (mmol/L)  Date Value  01/22/2020 4.6   Magnesium (mg/dL)  Date Value  01/17/2020 2.9 (H)   Calcium (mg/dL)  Date Value  01/22/2020 9.0   Albumin (g/dL)  Date Value  01/22/2020 2.6 (L)   Phosphorus (mg/dL)  Date Value  01/22/2020 2.9   Sodium (mmol/L)  Date Value  01/22/2020 139   Corrected Ca: 10.2 mg/dL  Assessment: 68 y.o.malewith medical history significant ofmental retardation, hypertension, hyperlipidemia, stroke, BPH, who presents with altered mental status. His renal function is poor but improving. He is not on HD due to conservative management in the setting of continuing to make urine. He has been receiving D5 with 20 mEq KCl/L for hypernatremia and hpokalemia which have resolved  Goal of Therapy:  Electrolytes WNL  Plan:   Continue D5 at 100 mL without 20 mEq/L KCl through today based on Dr Assunta Gambles recommendation  F/U electrolytes in am  Dallie Piles ,PharmD, BCPS Clinical Pharmacist 01/22/2020 10:59 AM

## 2020-01-23 DIAGNOSIS — Z515 Encounter for palliative care: Secondary | ICD-10-CM

## 2020-01-23 DIAGNOSIS — R4182 Altered mental status, unspecified: Secondary | ICD-10-CM

## 2020-01-23 DIAGNOSIS — E43 Unspecified severe protein-calorie malnutrition: Secondary | ICD-10-CM

## 2020-01-23 LAB — BASIC METABOLIC PANEL
Anion gap: 14 (ref 5–15)
BUN: 107 mg/dL — ABNORMAL HIGH (ref 8–23)
CO2: 15 mmol/L — ABNORMAL LOW (ref 22–32)
Calcium: 9.2 mg/dL (ref 8.9–10.3)
Chloride: 109 mmol/L (ref 98–111)
Creatinine, Ser: 6.31 mg/dL — ABNORMAL HIGH (ref 0.61–1.24)
GFR calc Af Amer: 9 mL/min — ABNORMAL LOW (ref >60)
GFR calc non Af Amer: 8 mL/min — ABNORMAL LOW (ref >60)
Glucose, Bld: 117 mg/dL — ABNORMAL HIGH (ref 70–99)
Potassium: 4.3 mmol/L (ref 3.5–5.1)
Sodium: 138 mmol/L (ref 135–145)

## 2020-01-23 MED ORDER — DEXTROSE 5 % IV SOLN: INTRAVENOUS | Status: AC

## 2020-01-23 NOTE — Progress Notes (Signed)
OT Cancellation Note  Patient Details Name: Samuel Shelton MRN: [id number] DOB: Jul 28, 1951   Cancelled Treatment:    Reason Eval/Treat Not Completed: Patient's level of consciousness. Pt continues to be inappropriate for OT. Pt remains total assist, non-verbal, and unable to participate. Will continue to follow and re-attempt as appropriate.    Jeni Salles, MPH, MS, OTR/L ascom [phone] 01/23/20, 3:48 PM

## 2020-01-23 NOTE — Progress Notes (Signed)
[place], [place] 01/23/20  Subjective:  Patient seen at bedside. Appears to have very concentrated urine in the urine canister. Urine output did improve a bit to 1.4 L over the preceding 24 hours. However renal parameters remain quite poor with a BUN of 107 and creatinine of 6.31.  Objective:  Vital signs in last 24 hours:  Temp:  [97.5 F (36.4 C)-102.9 F (39.4 C)] 98.3 F (36.8 C) (01/28 1234) Pulse Rate:  [88-116] 99 (01/28 1234) Resp:  [18-20] 18 (01/28 1234) BP: (114-130)/(77-99) 116/77 (01/28 1234) SpO2:  [97 %-100 %] 98 % (01/28 1234) Weight:  [79.9 kg] 79.9 kg (01/28 0454)  Weight change:  Filed Weights   01/08/20 0950 01/11/20 0108 01/23/20 0454  Weight: 85.7 kg 79.2 kg 79.9 kg    Intake/Output:    Intake/Output Summary (Last 24 hours) at 01/23/2020 1403 Last data filed at 01/23/2020 0900 Gross per 24 hour  Intake 126.6 ml  Output 1600 ml  Net -1473.4 ml     Physical Exam: General:  No acute distress, chronically ill-appearing, cachectic  HEENT  moist oral mucous membranes, temporal wasting  Pulm/lungs  room air, normal breathing effort  CVS/Heart  regular rhythm  Abdomen:   Soft, nontender  Extremities:  No peripheral edema  Neurologic:  Awake, alert, nonconversant  Skin:  Warm, dry    Basic Metabolic Panel:  Recent Labs  Lab 01/17/20 0506 01/18/20 0656 01/19/20 0417 01/19/20 0417 01/20/20 0521 01/20/20 0521 01/21/20 0439 01/22/20 0704 01/23/20 0741  NA 148*   < > 143  --  149*  --  145 139 138  K 3.0*   < > 3.0*  --  3.9  --  3.9 4.6 4.3  CL 108   < > 110  --  114*  --  110 109 109  CO2 21*   < > 21*  --  23  --  20* 18* 15*  GLUCOSE 127*   < > 106*  --  122*  --  133* 137* 117*  BUN 191*   < > 152*  --  122*  --  102* 108* 107*  CREATININE 8.29*   < > 6.03*  --  5.00*  --  4.58* 6.23* 6.31*  CALCIUM 8.3*   < > 8.6*   < > 8.8*   < > 9.3 9.0 9.2  MG 2.9*  --   --   --   --   --   --   --   --   PHOS  --    --  4.8*  --   --   --   --  2.9  --    < > = values in this interval not displayed.     CBC: Recent Labs  Lab 01/17/20 0506  WBC 6.5  HGB 11.4*  HCT 35.9*  MCV 87.3  PLT 98*     No results found for: HEPBSAG, HEPBSAB, HEPBIGM    Microbiology:  No results found for this or any previous visit (from the past 240 hour(s)).  Coagulation Studies: No results for input(s): LABPROT, INR in the last 72 hours.  Urinalysis: No results for input(s): COLORURINE, LABSPEC, PHURINE, GLUCOSEU, HGBUR, BILIRUBINUR, KETONESUR, PROTEINUR, UROBILINOGEN, NITRITE, LEUKOCYTESUR in the last 72 hours.  Invalid input(s): APPERANCEUR    Imaging: No results found.   Medications:   . sodium chloride Stopped (01/20/20 0158)  . dextrose 75 mL/hr at 01/23/20 0823   . mouth rinse  15 mL  Mouth Rinse q12n4p   sodium chloride, acetaminophen **OR** acetaminophen, ondansetron (ZOFRAN) IV  Assessment/ Plan:  69 y.o. male with history of mental retardation, hypertension, hyperlipidemia, stroke 2019, BPH, resident of [facility] home Covid virus infection 12/ 2019 admitted on 01/08/2020 for Acute kidney failure, unspecified ([place]) [N17.9] Hypernatremia [E87.0] Altered mental status, unspecified altered mental status type [R41.82]  #Acute renal failure Baseline creatinine of 0.95 in February 2020 Admission creatinine of 3.15, BUN 146 AKI likely secondary to ATN from Severe dehydration.  Recent history of COVID-19 infection in December 2020 Renal U/S neg for obstruction -Urine output was 1.4 L over the preceding 24 hours.  However BUN still remains quite high at 107 with a creatinine of 6.31.  No urgent indication for dialysis as the patient is producing urine.  Long-term dialysis would not be easy in this patient as he is already pulled several IV lines.  #Severe hypernatremia secondary to dehydration  improved significantly with D5W.  Serum sodium currently 138.  # Hypokalemia Potassium  also significantly improved and currently 4.3.  Patient's niece - Ms [name] [phone]   LOS: 15 Samuel Shelton 1/28/20212:03 PM  [place], [place]  Note: This note was prepared with Dragon dictation. Any transcription errors are unintentional

## 2020-01-23 NOTE — Consult Note (Signed)
[place] for Electrolyte Monitoring and Replacement   Recent Labs: Potassium (mmol/L)  Date Value  01/23/2020 4.3   Magnesium (mg/dL)  Date Value  01/17/2020 2.9 (H)   Calcium (mg/dL)  Date Value  01/23/2020 9.2   Albumin (g/dL)  Date Value  01/22/2020 2.6 (L)   Phosphorus (mg/dL)  Date Value  01/22/2020 2.9   Sodium (mmol/L)  Date Value  01/23/2020 138   Corrected Ca: 10.2 mg/dL  Assessment: 68 y.o.malewith medical history significant ofmental retardation, hypertension, hyperlipidemia, stroke, BPH, who presents with altered mental status. His renal function is poor but improving. He is not on HD due to conservative management in the setting of continuing to make urine.   Goal of Therapy:  Electrolytes WNL  Plan:   No electrolyte replacement warranted at this time  F/U electrolytes in am  Dallie Piles ,PharmD, BCPS Clinical Pharmacist 01/23/2020 11:00 AM

## 2020-01-23 NOTE — Progress Notes (Signed)
SLP Cancellation Note  Patient Details Name: Samuel Shelton MRN: [id number] DOB: 12/15/1951   Cancelled treatment:       Reason Eval/Treat Not Completed: Patient not medically ready;Medical issues which prohibited therapy(chart reviewed). Consulted w/ Palliative Care NP.  Family meeting today with MDs and patient's family. Patient's condition and treatment options were thoroughly explained including his moderate to severe protein calorie malnutrition; nonresponsiveness to verbal stimulation; NPO d/t concern of aspiration and refusal of participating w/ intake. Family interested in all aggressive treatment options including PEG tube placement and express understanding to the risks of these options. GI consulted.  Recommend continued NPO status while Cognitive/Mental Status remained so declined as risk for aspiration and Pulmonary impact are high. Recommend frequent oral care for hygiene and stimulation of swallowing; aspiration precautions during oral care. ST services will sign off at this time. Services can be reconsulted in the future if appropriate. MD/Palliative Care agreed.     Orinda Kenner, MS, CCC-SLP [place] [facility] 01/23/2020, 4:38 PM

## 2020-01-23 NOTE — Progress Notes (Signed)
[name], male   DOB: 1951-03-18, 68 y.o.   MRN: [id number] Family meeting with Ms Laurance Flatten, 2 nieces and nephew along with Dr Holley Raring and Darol Destine from Palliative care  Family visited pt in the room. Family meeting was held thereafter. Discussed overall presentation--hospital course and patient's condition. questions were answered. Family discussed amongst themselves and they feel they want to give their Uncle a chance and thereby request Feeding tube. Risk and complications discussed. They all voiced understanding.  Gi consult placed with Dr Allen Norris for PEG placement. Total time spent 45 mins

## 2020-01-23 NOTE — Consult Note (Signed)
[name], MD [facility]  [address] [place], [place] [address] Phone: [phone] Fax : [phone]  Consultation  Referring Provider:     Dr. Posey Pronto Primary Care Physician:  Sofie Hartigan, MD Primary Gastroenterologist: Althia Forts         Reason for Consultation:     PEG tube placement  Date of Admission:  01/08/2020 Date of Consultation:  01/23/2020         HPI:   Samuel Shelton is a 69 y.o. male with a history of mental retardation, hypertension, hyperlipidemia, stroke BPH and being treated for severe hyponatremia acute kidney injury hypercalcemia that has resolved and moderate to severe protein calorie malnutrition.  The patient is now being seen for the request of a PEG tube placement. Most of the history is gotten from the niece who states that she is the power of attorney.  There are 4 family members all nieces and nephews in the room when I spoke to them about the patient.  The patient is a resident at [facility].  Past Medical History:  Diagnosis Date  . Chronic mental illness   . Edema of both legs   . Elevated PSA   . Elevated PSA   . Hypertension   . Mental retardation   . Phimosis     Past Surgical History:  Procedure Laterality Date  . PROSTATE BIOPSY N/A 06/22/2015   Procedure: PROSTATE BIOPSY;  Surgeon: Hollice Espy, MD;  Location: [facility] ORS;  Service: Urology;  Laterality: N/A;  . PROSTATE BIOPSY N/A 09/27/2016   Procedure: PROSTATE BIOPSY;  Surgeon: Hollice Espy, MD;  Location: [facility] ORS;  Service: Urology;  Laterality: N/A;    Prior to Admission medications   Medication Sig Start Date End Date Taking? Authorizing Provider  acetaminophen (TYLENOL) 500 MG tablet Take 500 mg by mouth every 4 (four) hours as needed for mild pain.   Yes [provider]  aspirin 81 MG chewable tablet Chew 1 tablet (81 mg total) by mouth daily. 02/01/19  Yes Metzger-Cihelka, Desiree, NP  atorvastatin (LIPITOR) 40 MG tablet Take 1 tablet (40 mg  total) by mouth daily at 6 PM. 01/31/19  Yes Metzger-Cihelka, Desiree, NP  finasteride (PROSCAR) 5 MG tablet Take 1 tablet (5 mg total) by mouth daily. 09/05/18  Yes Hollice Espy, MD  hydrochlorothiazide (HYDRODIURIL) 12.5 MG tablet Take 25 mg by mouth daily.    Yes [provider]  Melatonin 3 MG TABS Take 3 mg by mouth at bedtime.   Yes [provider]  tamsulosin (FLOMAX) 0.4 MG CAPS capsule Take 1 capsule (0.4 mg total) by mouth daily. 10/27/17  Yes Hollice Espy, MD    Family History  Problem Relation Age of Onset  . Stroke Mother   . Hypertension Sister   . Diabetes type II Sister      Social History   Tobacco Use  . Smoking status: Former Smoker    Packs/day: 0.50    Types: Cigarettes  . Smokeless tobacco: Never Used  Substance Use Topics  . Alcohol use: No    Alcohol/week: 0.0 standard drinks  . Drug use: No    Allergies as of 01/08/2020  . (No Known Allergies)    Review of Systems:    All systems reviewed and negative except where noted in HPI.   Physical Exam:  Vital signs in last 24 hours: Temp:  [97.5 F (36.4 C)-99.1 F (37.3 C)] 98.3 F (36.8 C) (01/28 1234) Pulse Rate:  [88-99] 99 (  01/28 1234) Resp:  [18-20] 18 (01/28 1234) BP: (114-130)/(77-86) 116/77 (01/28 1234) SpO2:  [98 %-100 %] 98 % (01/28 1234) Weight:  [79.9 kg] 79.9 kg (01/28 0454) Last BM Date: 01/22/20 General:   Pleasant, cooperative in NAD Head:  Normocephalic and atraumatic. Eyes:   No icterus.   Conjunctiva pink. PERRLA. Extremities:  Without edema, cyanosis or clubbing. Skin:  Intact without significant lesions or rashes. Cervical Nodes:  No significant cervical adenopathy.   LAB RESULTS: No results for input(s): WBC, HGB, HCT, PLT in the last 72 hours. BMET Recent Labs    01/21/20 0439 01/22/20 0704 01/23/20 0741  NA 145 139 138  K 3.9 4.6 4.3  CL 110 109 109  CO2 20* 18* 15*  GLUCOSE 133* 137* 117*  BUN 102* 108* 107*  CREATININE 4.58* 6.23* 6.31*   CALCIUM 9.3 9.0 9.2   LFT Recent Labs    01/22/20 0704  ALBUMIN 2.6*   PT/INR No results for input(s): LABPROT, INR in the last 72 hours.  STUDIES: No results found.    Impression / Plan:   Assessment: Principal Problem:   Hypernatremia Active Problems:   Chronic mental disorder   HTN (hypertension)   Stroke (cerebrum) (H[facility])   Hyperlipidemia   BPH (benign prostatic hyperplasia)   Hypercalcemia   Acute metabolic encephalopathy   COVID-19 virus infection   Acute renal failure ([facility])   Dehydration   Elevated troponin   Lactic acidosis   DNR (do not resuscitate) discussion   Samuel Shelton is a 69 y.o. y/o male with protein malnutrition with mental retardation and I am now being asked to see the patient for PEG.  Plan:  I have discussed the risks and benefits of the PEG tube placement with the family.  The patient's power of attorney and the other family members have been told about the risk of bleeding, hitting another organ with perforation, anesthesia, infection, and that he may pull the tube out after it is placed.  After telling the family the risks and benefits they would like to proceed with the PEG tube placement.  The patient will be kept n.p.o. after midnight and have the procedure done tomorrow.  Thank you for involving me in the care of this patient.      LOS: 15 days   [name], MD  01/23/2020, 3:11 PM Pager [phone] 7am-5pm  Check AMION for 5pm -7am coverage and on weekends   Note: This dictation was prepared with Dragon dictation along with smaller phrase technology. Any transcriptional errors that result from this process are unintentional.

## 2020-01-23 NOTE — Progress Notes (Signed)
Daily Progress Note   Patient Name: Samuel Shelton       Date: 01/23/2020 DOB: 1951/08/04  Age: 69 y.o. MRN#: [id number] Attending Physician: Fritzi Mandes, MD Primary Care Physician: Sofie Hartigan, MD Admit Date: 01/08/2020  Reason for Consultation/Follow-up: Establishing goals of care  Subjective: Patient continues to not interact - does not respond to verbal stimulation, attempts to open eyes with physical stimulation. Remains NPO d/t concern of aspiration and has been refusing intake.   Length of Stay: 15  Current Medications: Scheduled Meds:  . mouth rinse  15 mL Mouth Rinse q12n4p    Continuous Infusions: . sodium chloride Stopped (01/20/20 0158)  . dextrose 75 mL/hr at 01/23/20 1436    PRN Meds: sodium chloride, acetaminophen **OR** acetaminophen, ondansetron (ZOFRAN) IV  Physical Exam Constitutional:      General: He is not in acute distress.    Comments: Does not respond to verbal stimulation, attempts to open eyes to touch  HENT:     Head: Normocephalic and atraumatic.  Cardiovascular:     Rate and Rhythm: Normal rate and regular rhythm.  Pulmonary:     Effort: Pulmonary effort is normal.  Musculoskeletal:     Right lower leg: No edema.     Left lower leg: No edema.  Skin:    General: Skin is warm and dry.  Neurological:     Comments: No meaningful interaction             Vital Signs: BP 116/77 (BP Location: Right Wrist)   Pulse 99   Temp 98.3 F (36.8 C) (Oral)   Resp 18   Ht 6' (1.829 m)   Wt 79.9 kg   SpO2 98%   BMI 23.90 kg/m  SpO2: SpO2: 98 % O2 Device: O2 Device: Room Air O2 Flow Rate:    Intake/output summary:   Intake/Output Summary (Last 24 hours) at 01/23/2020 1525 Last data filed at 01/23/2020 1436 Gross per 24 hour  Intake 466.15 ml  Output  1800 ml  Net -1333.85 ml   LBM: Last BM Date: 01/22/20 Baseline Weight: Weight: 85.7 kg Most recent weight: Weight: 79.9 kg       Palliative Assessment/Data: PPS 20%    Flowsheet Rows     Most Recent Value  Intake Tab  Referral Department  Hospitalist  Unit at Time of Referral  Med/Surg Unit  Palliative Care Primary Diagnosis  Sepsis/Infectious Disease  Date Notified  01/09/20  Palliative Care Type  New Palliative care  Reason for referral  Clarify Goals of Care  Date of Admission  01/08/20  Date first seen by Palliative Care  01/10/20  # of days Palliative referral response time  1 Day(s)  # of days IP prior to Palliative referral  1  Clinical Assessment  Psychosocial & Spiritual Assessment  Palliative Care Outcomes      Patient Active Problem List   Diagnosis Date Noted  . Protein-calorie malnutrition, severe ([place])   . DNR (do not resuscitate) discussion   . Hypernatremia 01/08/2020  . Hypercalcemia 01/08/2020  . Acute metabolic encephalopathy [id number]  . COVID-19 virus infection 01/08/2020  . Acute renal failure ([place]) 01/08/2020  . Dehydration 01/08/2020  . Elevated troponin  01/08/2020  . Lactic acidosis 01/08/2020  . Altered mental status   . Hyperlipidemia 01/29/2019  . Family hx-stroke 01/29/2019  . BPH (benign prostatic hyperplasia) 01/29/2019  . 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 Edema leg 08/20/2014  . Chronic mental disorder 08/20/2014  . HTN (hypertension) 08/20/2014  . Intellectual disability 08/20/2014    Palliative Care Assessment & Plan   HPI: Palliative Care consult requested for goals of care in this 69 y.o. male with multiple medical problems including mental retardation, hypertension, hyperlipidemia, stroke (expressive aphasia), and BPH. Mr Campe presented to ED from [facility] with complaints of altered mental status. On admission it was reported patient had decreased responsiveness and poor oral nutrition. Patient was COVID-19  positive on 12/13/19. During his ED work-up NA 170,  AKI with creatinine 3.15, BUN 146, potassium 4.2.Chest x-ray showed a bilateral interstitial marking withoutobvious infiltration. CT head is negative for acute intracranial abnormalities. Since admission patient continues with poor po intake, gazed stare, and unresponsive. Worsening creatinine. PMT asked to reengage 1/26.  Assessment: Participated in family meeting with Dr. Holley Raring and Dr. Posey Pronto - patient's 3 nieces and 1 nephew were present for conversation. Patient's condition and treatment options were thoroughly explained. Family given time to process information and all questions were addressed. Ultimately, family requested full scope care. They are interested in all aggressive treatment options offered and express understanding to the risks of these options.   Recommendations/Plan: Likely no need for further PMT involvement as GOC are clear for full scope treatment - will shadow chart and will be available as needed  Code Status:  DNR as previously documented - not explicitly discussed today  Prognosis:  Poor prognosis r/t kidney failure, failure to thrive - at risk for acute decompensation  Discharge Planning:  To Be Determined  Care plan was discussed with Dr. Posey Pronto, Dr. Holley Raring, 3 nieces, 1 nephew  Thank you for allowing the Palliative Medicine Team to assist in the care of this patient.   Total Time 50 minutes Prolonged Time Billed  no       Greater than 50%  of this time was spent counseling and coordinating care related to the above assessment and plan.  Juel Burrow, DNP, [facility] Palliative Medicine Team Team Phone # [phone]  Pager [phone]

## 2020-01-23 NOTE — TOC Progression Note (Signed)
Transition of Care [facility]) - Progression Note    Patient Details  Name: Samuel Shelton MRN: [id number] Date of Birth: June 01, 1951  Transition of Care [facility]) CM/SW Contact  Beverly Sessions, RN Phone Number: 01/23/2020, 3:20 PM  Clinical Narrative:      Plan for PEG tube placement tomorrow.   Will follow up with niece tomorrow regarding discharge disposition       Expected Discharge Plan and Services                                                 Social Determinants of Health (SDOH) Interventions    Readmission Risk Interventions No flowsheet data found.

## 2020-01-23 NOTE — Progress Notes (Addendum)
Samuel Shelton at [place] NAME: Samuel Shelton    MR#:  [id number]  DATE OF BIRTH:  08-01-1951  SUBJECTIVE:  Does not respond to VC. Eyes closed today. Not eating or drinking Getting IVF Had fever 102 x1 yday--resolved with PR temp  REVIEW OF SYSTEMS:   Review of Systems  Unable to perform ROS: Medical condition  remains confused, nonverbal DRUG ALLERGIES:  No Known Allergies  VITALS:  Blood pressure 114/86, pulse 97, temperature 99.1 F (37.3 C), temperature source Oral, resp. rate 20, height 6' (1.829 m), weight 79.9 kg, SpO2 99 %.  PHYSICAL EXAMINATION:   Physical Exam-- limited exam due pt unable to participate  GENERAL:  69 y.o.-year-old patient lying in the bed with no acute distress.  Appears severely dehydrated LUNGS: Normal breath sounds bilaterally, no wheezing No use of accessory muscles of respiration.  CARDIOVASCULAR: S1, S2 normal.  NEUROLOGIC: confused  Nonverbal/does not communicate  SKIN: No obvious rash, lesion, or ulcer LABORATORY PANEL:  CBC Recent Labs  Lab 01/17/20 0506  WBC 6.5  HGB 11.4*  HCT 35.9*  PLT 98*    Chemistries  Recent Labs  Lab 01/17/20 0506 01/18/20 0656 01/23/20 0741  NA 148*   < > 138  K 3.0*   < > 4.3  CL 108   < > 109  CO2 21*   < > 15*  GLUCOSE 127*   < > 117*  BUN 191*   < > 107*  CREATININE 8.29*   < > 6.31*  CALCIUM 8.3*   < > 9.2  MG 2.9*  --   --    < > = values in this interval not displayed.   Cardiac Enzymes No results for input(s): TROPONINI in the last 168 hours. RADIOLOGY:  No results found. ASSESSMENT AND PLAN:  Samuel Shelton is a 69 y.o. male with medical history significant of mental retardation, hypertension, hyperlipidemia, stroke, BPH, who presents with altered mental status.  Severe Hypernatremia: Na 170 on admission   -ultrasound renal essentially nothing acute - Serum Sodium 138 today, continue intravenous D5W per nephrology  AKI with ATN  secondary to severe dehydration  -Nephrology following.   urine output reasonable -creat trending up again 6.3 (4.5)  Chronic mental disorder and acute metabolic encephalopathy, uremic -CT head is negative for acute intracranial abnormalities, likely due to electrolytes disturbance and worsening renal function -psychiatry input noted. Discussed with Dr. Roney Marion input and having discussion with niece  Hypercalcemia  Resolved  Moderate to severe protein calorie nutrition Patient is not eating.  Does not have motivation/drive to eat  Recent COVID-19 virus infection: - Patient had a positive COVID-19 on 12/18  Overallguarded prognosis.    Family communication : plan to meet  with patient's niece today- Ms Samuel Shelton [phone] along With dr Holley Raring and Palliative NP at 2 pm to discuss pt's overall condition and determine goals of care -Consults : nephrology, palliative care  discharge Disposition : SNF with outpatient Palliative, then transition to long term care Discharge barrier: family meeting to decide goals of care CODE STATUS:DNR DVT Prophylaxis scd  TOTAL TIME TAKING CARE OF THIS PATIENT: *25* minutes.  >50% time spent on counselling and coordination of care  Note: This dictation was prepared with Dragon dictation along with smaller phrase technology. Any transcriptional errors that result from this process are unintentional.  Samuel Shelton M.D on 01/23/2020 at 10:07 AM  [facility]   CC: Primary care physician; Samuel Distance  Shelton, MDPatient ID: Samuel Shelton, male   DOB: 18-Aug-1951, 69 y.o.   MRN: [id number]

## 2020-01-24 ENCOUNTER — Inpatient Hospital Stay: Payer: Medicare (Managed Care)

## 2020-01-24 ENCOUNTER — Encounter
Admission: EM | Disposition: A | Discharge status: Skilled Nursing Facility | Payer: Self-pay | Source: Home / Self Care | Attending: Internal Medicine

## 2020-01-24 ENCOUNTER — Inpatient Hospital Stay: Payer: Medicare (Managed Care) | Admitting: Anesthesiology

## 2020-01-24 ENCOUNTER — Encounter: Payer: Self-pay | Admitting: Internal Medicine

## 2020-01-24 DIAGNOSIS — K269 Duodenal ulcer, unspecified as acute or chronic, without hemorrhage or perforation: Secondary | ICD-10-CM

## 2020-01-24 DIAGNOSIS — R1312 Dysphagia, oropharyngeal phase: Secondary | ICD-10-CM

## 2020-01-24 DIAGNOSIS — K2900 Acute gastritis without bleeding: Secondary | ICD-10-CM

## 2020-01-24 HISTORY — PX: PEG PLACEMENT: SHX5437

## 2020-01-24 LAB — BASIC METABOLIC PANEL
Anion gap: 14 (ref 5–15)
BUN: 105 mg/dL — ABNORMAL HIGH (ref 8–23)
CO2: 17 mmol/L — ABNORMAL LOW (ref 22–32)
Calcium: 8.5 mg/dL — ABNORMAL LOW (ref 8.9–10.3)
Chloride: 106 mmol/L (ref 98–111)
Creatinine, Ser: 5.54 mg/dL — ABNORMAL HIGH (ref 0.61–1.24)
GFR calc Af Amer: 11 mL/min — ABNORMAL LOW (ref >60)
GFR calc non Af Amer: 10 mL/min — ABNORMAL LOW (ref >60)
Glucose, Bld: 113 mg/dL — ABNORMAL HIGH (ref 70–99)
Potassium: 4.2 mmol/L (ref 3.5–5.1)
Sodium: 137 mmol/L (ref 135–145)

## 2020-01-24 LAB — URINALYSIS, COMPLETE (UACMP) WITH MICROSCOPIC
Bilirubin Urine: NEGATIVE
Glucose, UA: NEGATIVE mg/dL
Ketones, ur: NEGATIVE mg/dL
Nitrite: NEGATIVE
Protein, ur: >=300 mg/dL — AB
RBC / HPF: >50 RBC/hpf — ABNORMAL HIGH (ref 0–5)
Specific Gravity, Urine: 1.014 (ref 1.005–1.030)
Squamous Epithelial / HPF: NONE SEEN (ref 0–5)
WBC, UA: >50 WBC/hpf — ABNORMAL HIGH (ref 0–5)
pH: 8 (ref 5.0–8.0)

## 2020-01-24 SURGERY — INSERTION, PEG TUBE
Anesthesia: General

## 2020-01-24 MED ORDER — FENTANYL CITRATE (PF) 100 MCG/2ML IJ SOLN
INTRAMUSCULAR | Status: AC
  Filled 2020-01-24: qty 2

## 2020-01-24 MED ORDER — LIDOCAINE HCL (PF) 2 % IJ SOLN
INTRAMUSCULAR | Status: AC
  Filled 2020-01-24: qty 10

## 2020-01-24 MED ORDER — FENTANYL CITRATE (PF) 100 MCG/2ML IJ SOLN
INTRAMUSCULAR | Status: DC | PRN
  Administered 2020-01-24 (×2): 50 ug via INTRAVENOUS

## 2020-01-24 MED ORDER — HEPARIN SODIUM (PORCINE) 5000 UNIT/ML IJ SOLN
5000.0000 [IU] | Freq: Three times a day (TID) | INTRAMUSCULAR | Status: DC
  Administered 2020-01-24: 5000 [IU] via SUBCUTANEOUS
  Filled 2020-01-24: qty 1

## 2020-01-24 MED ORDER — ONDANSETRON HCL 4 MG/2ML IJ SOLN
INTRAMUSCULAR | Status: AC
  Filled 2020-01-24: qty 2

## 2020-01-24 MED ORDER — ONDANSETRON HCL 4 MG/2ML IJ SOLN
INTRAMUSCULAR | Status: DC | PRN
  Administered 2020-01-24: 4 mg via INTRAVENOUS

## 2020-01-24 MED ORDER — PHENYLEPHRINE HCL (PRESSORS) 10 MG/ML IV SOLN
INTRAVENOUS | Status: DC | PRN
  Administered 2020-01-24: 200 ug via INTRAVENOUS
  Administered 2020-01-24: 100 ug via INTRAVENOUS

## 2020-01-24 MED ORDER — CEFAZOLIN SODIUM-DEXTROSE 2-4 GM/100ML-% IV SOLN
2.0000 g | Freq: Once | INTRAVENOUS | Status: AC
  Administered 2020-01-24: 2 g via INTRAVENOUS
  Filled 2020-01-24: qty 100

## 2020-01-24 MED ORDER — PANTOPRAZOLE SODIUM 40 MG IV SOLR
40.0000 mg | Freq: Two times a day (BID) | INTRAVENOUS | Status: DC
  Administered 2020-01-24 – 2020-01-28 (×9): 40 mg via INTRAVENOUS
  Filled 2020-01-24 (×8): qty 40

## 2020-01-24 MED ORDER — SODIUM CHLORIDE 0.9 % IV SOLN
INTRAVENOUS | Status: DC
  Administered 2020-01-24: 1000 mL via INTRAVENOUS

## 2020-01-24 MED ORDER — CEFAZOLIN SODIUM-DEXTROSE 2-4 GM/100ML-% IV SOLN
INTRAVENOUS | Status: AC
  Filled 2020-01-24: qty 100

## 2020-01-24 MED ORDER — DEXAMETHASONE SODIUM PHOSPHATE 10 MG/ML IJ SOLN
INTRAMUSCULAR | Status: DC | PRN
  Administered 2020-01-24: 10 mg via INTRAVENOUS

## 2020-01-24 MED ORDER — PROPOFOL 10 MG/ML IV BOLUS
INTRAVENOUS | Status: DC | PRN
  Administered 2020-01-24: 170 mg via INTRAVENOUS

## 2020-01-24 MED ORDER — SUCCINYLCHOLINE CHLORIDE 20 MG/ML IJ SOLN
INTRAMUSCULAR | Status: DC | PRN
  Administered 2020-01-24: 100 mg via INTRAVENOUS

## 2020-01-24 MED ORDER — SODIUM CHLORIDE 0.9 % IV SOLN
1.0000 g | INTRAVENOUS | Status: DC
  Administered 2020-01-24 – 2020-01-26 (×3): 1 g via INTRAVENOUS
  Filled 2020-01-24: qty 10
  Filled 2020-01-24 (×3): qty 1

## 2020-01-24 MED ORDER — DEXAMETHASONE SODIUM PHOSPHATE 4 MG/ML IJ SOLN
INTRAMUSCULAR | Status: AC
  Filled 2020-01-24: qty 1

## 2020-01-24 MED ORDER — LIDOCAINE HCL (CARDIAC) PF 100 MG/5ML IV SOSY
PREFILLED_SYRINGE | INTRAVENOUS | Status: DC | PRN
  Administered 2020-01-24: 100 mg via INTRAVENOUS

## 2020-01-24 MED ORDER — DEXTROSE 5 % IV SOLN: INTRAVENOUS | Status: DC

## 2020-01-24 NOTE — Care Management Important Message (Signed)
Important Message  Patient Details  Name: Samuel Shelton MRN: [id number] Date of Birth: 12-Dec-1951   Medicare Important Message Given:  Yes  Verbally reviewed with niece.  Copy of Medicare IM sent securely to email address provided: [email or web address].     Dannette Barbara 01/24/2020, 11:56 AM

## 2020-01-24 NOTE — TOC Progression Note (Signed)
Transition of Care [facility]) - Progression Note    Patient Details  Name: Samuel Shelton MRN: [id number] Date of Birth: 1951-07-03  Transition of Care [facility]) CM/SW Contact  Beverly Sessions, RN Phone Number: 01/24/2020, 10:26 AM  Clinical Narrative:      Spoke with niece regarding discharge disposition.  Informed niece that once peg tube is placed, and patient is able to tolerate his tube feeds I would anticipated he would be medically stable for discharge.  She has additional questions, states that she will call me back after she is done with class  Niece notified that at this time the only bed offer for SNF remains [place] and rehab.  She has accepted the bed  Accepted [facility] the [facility], and notified Broadus John at [employer] [facility]                                                 Social Determinants of Health ([place]) Interventions    Readmission Risk Interventions No flowsheet data found.

## 2020-01-24 NOTE — Consult Note (Addendum)
[facility] for Electrolyte Monitoring and Replacement   Recent Labs: Potassium (mmol/L)  Date Value  01/24/2020 4.2   Magnesium (mg/dL)  Date Value  01/17/2020 2.9 (H)   Calcium (mg/dL)  Date Value  01/24/2020 8.5 (L)   Albumin (g/dL)  Date Value  01/22/2020 2.6 (L)   Phosphorus (mg/dL)  Date Value  01/22/2020 2.9   Sodium (mmol/L)  Date Value  01/24/2020 137   Corrected Ca: 9.6 mg/dL  Assessment: 68 y.o.malewith medical history significant ofmental retardation, hypertension, hyperlipidemia, stroke, BPH, who presents with altered mental status. His renal function is poor but improving. He is not on HD due to conservative management in the setting of continuing to make urine.   D5 @ 75 ml/hr  Goal of Therapy:  Electrolytes WNL  Plan:   No electrolyte replacement warranted at this time - stable x3 days  Per dietitian, pt at high re-feed risk with PEG tube planned for today, will need to watch K, Mag, Phos for a few days - labs ordered for AM    Samuel Shelton ,PharmD, BCPS Clinical Pharmacist 01/24/2020 10:37 AM

## 2020-01-24 NOTE — Progress Notes (Signed)
Nutrition Follow Up Note   DOCUMENTATION CODES:   Not applicable  INTERVENTION:   Once PEG tube in place and ready for use, recommend:  Jevity 1.5 @65ml /hr- Initiaite at 36ml/hr and increase by 47ml/hr q 8 hours until goal rate is reached  Free water flushes 131ml q4 hours   Regimen provides 2340kcal/day, 100g/day protein, 32g/day fiber, 1915ml/day free water   Pt at high refeed risk; recommend monitor K, Mg and P labs daily until stable  NUTRITION DIAGNOSIS:   Inadequate oral intake related to lethargy/confusion(acute metabolic encephalopathy; chronic mental illness) as evidenced by other (comment)(pt with poor oral intake since admit, requiring PEG tube).  GOAL:   Patient will meet greater than or equal to 90% of their needs  -progressing with PEG tube placement  MONITOR:   Labs, Weight trends, TF tolerance, Skin, I & O's  REASON FOR ASSESSMENT:   Consult Enteral/tube feeding initiation and management  ASSESSMENT:    69 year old male with past medical history of chronic mental illness, mental retardation, chronic bilateral LE edema, HTN, HLD, BPH, nonverbal s/p stroke in 2019, and positive Covid 19 test on 12/18. Patient presented from SNF with worsening mental status, decreased oral intake and decreased responsiveness from baseline.  Pt continues to have poor appetite and oral intake since admit. Plan is for PEG tube placement today. Tube feeds will likely start tomorrow morning. Pt is at high refeed risk; recommend monitor electrolytes. Per chart, pt appears weight stable ~175lbs.    Medications reviewed and include: 5% dextrose @75ml /hr  Labs reviewed: K 4.2 wnl, BUN 105(H), creat 5.54(H) P 2.9 wnl, Mg 2.6(H)- 1/27  Diet Order:   Diet Order            Diet NPO time specified  Diet effective midnight             EDUCATION NEEDS:   Not appropriate for education at this time  Skin:  Skin Assessment: Reviewed RN Assessment(PI;left heel)  Last BM:  1/28-  type 6  Height:   Ht Readings from Last 1 Encounters:  01/11/20 6' (1.829 m)    Weight:   Wt Readings from Last 1 Encounters:  01/23/20 79.9 kg    Ideal Body Weight:  80.9 kg  BMI:  Body mass index is 23.9 kg/m.  Estimated Nutritional Needs:   Kcal:  2100-2400kcal/day  Protein:  105-120g/day  Fluid:  >2L/day  Koleen Distance MS, RD, LDN Pager #- [phone] Office#- [phone] After Hours Pager: [phone]

## 2020-01-24 NOTE — Transfer of Care (Signed)
Immediate Anesthesia Transfer of Care Note  Patient: Samuel Shelton  Procedure(s) Performed: PERCUTANEOUS ENDOSCOPIC GASTROSTOMY (PEG) PLACEMENT (N/A )  Patient Location: PACU  Anesthesia Type:General  Level of Consciousness: drowsy  Airway & Oxygen Therapy: Patient Spontanous Breathing and Patient connected to face mask oxygen  Post-op Assessment: Report given to RN and Post -op Vital signs reviewed and stable  Post vital signs: Reviewed and stable  Last Vitals:  Vitals Value Taken Time  BP 124/91 01/24/20 1358  Temp    Pulse 85 01/24/20 1402  Resp 17 01/24/20 1402  SpO2 98 % 01/24/20 1402  Vitals shown include unvalidated device data.  Last Pain:  Vitals:   01/24/20 1220  TempSrc: Temporal  PainSc:          Complications: No apparent anesthesia complications

## 2020-01-24 NOTE — Anesthesia Procedure Notes (Signed)
Procedure Name: Intubation Date/Time: 01/24/2020 1:21 PM Performed by: Lia Foyer, CRNA Pre-anesthesia Checklist: Patient identified, Emergency Drugs available, Suction available and Patient being monitored Patient Re-evaluated:Patient Re-evaluated prior to induction Oxygen Delivery Method: Circle system utilized Preoxygenation: Pre-oxygenation with 100% oxygen Induction Type: IV induction Ventilation: Mask ventilation without difficulty Laryngoscope Size: McGraph and 4 Grade View: Grade I Tube type: Oral Tube size: 7.5 mm Number of attempts: 1 Airway Equipment and Method: Stylet and Oral airway Placement Confirmation: ETT inserted through vocal cords under direct vision,  positive ETCO2 and breath sounds checked- equal and bilateral Secured at: 21 cm Tube secured with: Tape Dental Injury: Teeth and Oropharynx as per pre-operative assessment

## 2020-01-24 NOTE — Progress Notes (Addendum)
Urine analysis and urine culture obtained as urine color has changed from amber to pink milky color and thick. Abdominal binder used to protect patient from pulling out PEG tube. RN spoke with case management and indicated that patient may require covid swab for SNF placement when stable.

## 2020-01-24 NOTE — Progress Notes (Addendum)
Patient ID: Candice Misner, male   DOB: 27-May-1951, 69 y.o.   MRN: [id number]  PEG tube placed by Dr Allen Norris.  Dietitian to initiate TF 24 hours of PEG placement Place Abdominal binder to avoid PEG pulling -EGD showed gastritis and non bleeding cratered ulcer--IV protonix 40 mg bid -repeat US renal showed some debris in the bladder ?infection vs blood -check UA and UC -CT abdomen ordered by Nephrology. -pt afebrile  UA looks abnormal for possible UTI vs ?stone  Start IV rocephin 1 g daily

## 2020-01-24 NOTE — Anesthesia Preprocedure Evaluation (Signed)
Anesthesia Evaluation  Patient identified by MRN, date of birth, ID band Patient awake and Patient confused  General Assessment Comment:Hx reviewed with pt's niece  Reviewed: Allergy & Precautions, NPO status , Patient's Chart, lab work & pertinent test results  History of Anesthesia Complications Negative for: history of anesthetic complications  Airway Mallampati: III  TM Distance: >3 FB Neck ROM: Full    Dental  (+) Poor Dentition   Pulmonary neg sleep apnea, neg COPD, former smoker,    breath sounds clear to auscultation- rhonchi (-) wheezing      Cardiovascular hypertension, Pt. on medications (-) CAD, (-) Past MI, (-) Cardiac Stents and (-) CABG  Rhythm:Regular Rate:Normal - Systolic murmurs and - Diastolic murmurs    Neuro/Psych neg Seizures PSYCHIATRIC DISORDERS CVA    GI/Hepatic negative GI ROS, Neg liver ROS,   Endo/Other  negative endocrine ROSneg diabetes  Renal/GU Renal InsufficiencyRenal disease     Musculoskeletal negative musculoskeletal ROS (+)   Abdominal (+) - obese,   Peds  Hematology negative hematology ROS (+)   Anesthesia Other Findings Past Medical History: No date: Chronic mental illness No date: Edema of both legs No date: Elevated PSA No date: Elevated PSA No date: Hypertension No date: Mental retardation No date: Phimosis   Reproductive/Obstetrics                             Anesthesia Physical Anesthesia Plan  ASA: III  Anesthesia Plan: General   Post-op Pain Management:    Induction: Intravenous  PONV Risk Score and Plan: 1 and Ondansetron and Treatment may vary due to age or medical condition  Airway Management Planned: Oral ETT  Additional Equipment:   Intra-op Plan:   Post-operative Plan: Extubation in OR  Informed Consent: I have reviewed the patients History and Physical, chart, labs and discussed the procedure including the risks,  benefits and alternatives for the proposed anesthesia with the patient or authorized representative who has indicated his/her understanding and acceptance.     Dental advisory given  Plan Discussed with: CRNA and Anesthesiologist  Anesthesia Plan Comments:         Anesthesia Quick Evaluation

## 2020-01-24 NOTE — Progress Notes (Signed)
Verbal order for foley catheter placement placed per Dr. Posey Pronto. Night shift RN notified.

## 2020-01-24 NOTE — Progress Notes (Addendum)
[place] at [place] NAME: Samuel Shelton    MR#:  [id number]  DATE OF BIRTH:  [birth year]  SUBJECTIVE:  Does not respond to VC. No change in pt's condition Getting IVF No fever REVIEW OF SYSTEMS:   Review of Systems  Unable to perform ROS: Medical condition  remains confused, nonverbal DRUG ALLERGIES:  No Known Allergies  VITALS:  Blood pressure 138/83, pulse 92, temperature 97.9 F (36.6 C), temperature source Oral, resp. rate 18, height 6' (1.829 m), weight 79.9 kg, SpO2 98 %.  PHYSICAL EXAMINATION:   Physical Exam-- limited exam due pt unable to participate  GENERAL:  69 y.o.-year-old patient lying in the bed with no acute distress.  LUNGS: Normal breath sounds bilaterally, no wheezing No use of accessory muscles of respiration.  CARDIOVASCULAR: S1, S2 normal.  External foley+ NEUROLOGIC: confused  Nonverbal/does not communicate  SKIN per RN--POA  Pressure Injury 01/11/20 Heel Left (Active)  01/11/20 0137  Location: Heel  Location Orientation: Left  Staging:   Wound Description (Comments):   Present on Admission:      Pressure Injury 01/22/20 Heel Right Deep Tissue Pressure Injury - Purple or maroon localized area of discolored intact skin or blood-filled blister due to damage of underlying soft tissue from pressure and/or shear. dark purple (Active)  01/22/20 1427  Location: Heel  Location Orientation: Right  Staging: Deep Tissue Pressure Injury - Purple or maroon localized area of discolored intact skin or blood-filled blister due to damage of underlying soft tissue from pressure and/or shear.  Wound Description (Comments): dark purple  Present on Admission:    LABORATORY PANEL:  CBC No results for input(s): WBC, HGB, HCT, PLT in the last 168 hours.  Chemistries  Recent Labs  Lab 01/24/20 0445  NA 137  K 4.2  CL 106  CO2 17*  GLUCOSE 113*  BUN 105*  CREATININE 5.54*  CALCIUM 8.5*   Cardiac Enzymes No  results for input(s): TROPONINI in the last 168 hours. RADIOLOGY:  No results found. ASSESSMENT AND PLAN:  Samuel Shelton is a 69 y.o. male with medical history significant of mental retardation, hypertension, hyperlipidemia, stroke, BPH, who presents with altered mental status.  Severe Hypernatremia: Na 170 on admission   -ultrasound renal essentially nothing acute - Serum Sodium 138 today, continue intravenous D5W per nephrology  AKI with ATN secondary to severe dehydration  -Nephrology following.   urine output reasonable -creat 5.5  Chronic mental disorder and acute metabolic encephalopathy, uremic -CT head is negative for acute intracranial abnormalities, likely due to electrolytes disturbance and worsening renal function -psychiatry input noted. Discussed with Dr. Roney Marion input and having discussion with niece  Hypercalcemia/ Hypokalemia Resolved  Moderate to severe protein calorie nutrition Family (nieces and nephew) has decided for PEG tube feeding. -GI consult with Dr Dawson Bills today -Dietitian consulted  Recent COVID-19 virus infection: - Patient had a positive COVID-19 on 12/12/2020. -pt DOES NOT need airborne isolation per infection control  Overallguarded prognosis.    Family communication : met with family yday in person -Consults : nephrology, palliative care, GI  discharge Disposition : SNF with outpatient Palliative, then transition to long term care Discharge barrier: PEG tube placement, starting PEG feeding, finding SNF  CODE STATUS:DNR DVT Prophylaxis heparin  TOTAL TIME TAKING CARE OF THIS PATIENT: *25* minutes.  >50% time spent on counselling and coordination of care  Note: This dictation was prepared with Dragon dictation along with smaller phrase technology. Any transcriptional errors  that result from this process are unintentional.  Fritzi Mandes M.D on 01/24/2020 at 8:46 AM  [facility]   CC: Primary care physician;  Sofie Hartigan, MDPatient ID: Samuel Shelton, male   DOB: 1951-03-28, 69 y.o.   MRN: [id number]

## 2020-01-24 NOTE — Progress Notes (Signed)
PT Cancellation Note  Patient Details Name: Samuel Shelton MRN: [id number] DOB: 1951/10/19   Cancelled Treatment:    Reason Eval/Treat Not Completed: Other (comment)(Per chart, pt off the floor at this time. PT to follow up as able.)   Lieutenant Diego PT, DPT 12:50 PM,01/24/20

## 2020-01-24 NOTE — Op Note (Signed)
[facility] Gastroenterology Patient Name: Samuel Shelton Procedure Date: 01/24/2020 12:46 PM MRN: [id number] Account #: [id number] Date of Birth: 11/22/1951 Admit Type: Inpatient Age: 69 Room: [facility] ENDO ROOM 1 Gender: Male Note Status: Finalized Procedure:             Upper GI endoscopy Indications:           Dysphagia Providers:             [name] MD, MD Medicines:             General Anesthesia Complications:         No immediate complications. Procedure:             Pre-Anesthesia Assessment:                        - Prior to the procedure, a History and Physical was                         performed, and patient medications and allergies were                         reviewed. The patient's tolerance of previous                         anesthesia was also reviewed. The risks and benefits                         of the procedure and the sedation options and risks                         were discussed with the patient. All questions were                         answered, and informed consent was obtained. Prior                         Anticoagulants: The patient has taken no previous                         anticoagulant or antiplatelet agents. ASA Grade                         Assessment: III - A patient with severe systemic                         disease. After reviewing the risks and benefits, the                         patient was deemed in satisfactory condition to                         undergo the procedure.                        After obtaining informed consent, the endoscope was                         passed under direct vision. Throughout the procedure,  the patient's blood pressure, pulse, and oxygen                         saturations were monitored continuously. The Endoscope                         was introduced through the mouth, and advanced to the                         second part of duodenum. The upper GI  endoscopy was                         accomplished without difficulty. The patient tolerated                         the procedure well. Findings:      LA Grade D (one or more mucosal breaks involving at least 75% of       esophageal circumference) esophagitis with no bleeding was found in the       lower third of the esophagus.      Diffuse mild inflammation characterized by erosions was found in the       gastric antrum. Placement of an externally removable PEG with no       T-fasteners was successfully completed. The external bumper was at the       3.0 cm marking on the tube.      One non-bleeding cratered duodenal ulcer with no stigmata of bleeding       was found in the first portion of the duodenum. Impression:            - LA Grade D reflux esophagitis with no bleeding.                        - Gastritis.                        - Non-bleeding duodenal ulcer with no stigmata of                         bleeding.                        - An externally removable PEG placement was                         successfully completed.                        - No specimens collected.                        - Watch for any signs of bleeding Recommendation:        - Return patient to hospital ward for ongoing care.                        - Please follow the post-PEG recommendations                         including: change dressing once per day, NPO x4 hrs  then water today, may use PEG today for meds and water                         and may use PEG tomorrow for feedings. Procedure Code(s):     --- Professional ---                        [phone], Esophagogastroduodenoscopy, flexible,                         transoral; with directed placement of percutaneous                         gastrostomy tube Diagnosis Code(s):     --- Professional ---                        R13.10, Dysphagia, unspecified                        K29.70, Gastritis, unspecified, without bleeding                         K26.9, Duodenal ulcer, unspecified as acute or                         chronic, without hemorrhage or perforation                        K21.00, Gastro-esophageal reflux disease with                         esophagitis, without bleeding CPT copyright 2019 American Medical Association. All rights reserved. The codes documented in this report are preliminary and upon coder review may  be revised to meet current compliance requirements. [name] MD, MD 01/24/2020 3:09:11 PM This report has been signed electronically. Number of Addenda: 0 Note Initiated On: 01/24/2020 12:46 PM Estimated Blood Loss:  Estimated blood loss: none.      [facility]

## 2020-01-24 NOTE — Progress Notes (Signed)
OT Cancellation Note  Patient Details Name: Akxel Thiam MRN: [id number] DOB: [birth year]   Cancelled Treatment:    Reason Eval/Treat Not Completed: Patient at procedure or test/ unavailable. Pt out of the room upon attempt. Will re-attempt at later date/time as medically appropriate.   Jeni Salles, MPH, MS, OTR/L ascom [phone] 01/24/20, 12:51 PM

## 2020-01-24 NOTE — Anesthesia Postprocedure Evaluation (Signed)
Anesthesia Post Note  Patient: Samuel Shelton  Procedure(s) Performed: PERCUTANEOUS ENDOSCOPIC GASTROSTOMY (PEG) PLACEMENT (N/A )  Patient location during evaluation: PACU Anesthesia Type: General Level of consciousness: awake and alert Pain management: pain level controlled Vital Signs Assessment: post-procedure vital signs reviewed and stable Respiratory status: spontaneous breathing and respiratory function stable Cardiovascular status: stable Anesthetic complications: no     Last Vitals:  Vitals:   01/24/20 1457 01/24/20 1518  BP: 106/80 108/76  Pulse: 87 88  Resp: 16 20  Temp:  37.1 C  SpO2: 96% 99%    Last Pain:  Vitals:   01/24/20 1518  TempSrc: Oral  PainSc:                  [name]

## 2020-01-24 NOTE — Progress Notes (Signed)
[place], [place] 01/24/20  Subjective:  Patient seen at bedside. Due for PEG tube placement today. Renal function has improved a bit as creatinine down to 5.5. Urine output was 950 cc over the preceding 24 hours. We participated in family meeting yesterday. Patient's family requests ongoing aggressive care.  Objective:  Vital signs in last 24 hours:  Temp:  [97.6 F (36.4 C)-98.8 F (37.1 C)] 98.7 F (37.1 C) (01/29 1518) Pulse Rate:  [85-99] 88 (01/29 1518) Resp:  [15-20] 20 (01/29 1518) BP: (100-156)/(68-91) 108/76 (01/29 1518) SpO2:  [95 %-100 %] 99 % (01/29 1518)  Weight change:  Filed Weights   01/08/20 0950 01/11/20 0108 01/23/20 0454  Weight: 85.7 kg 79.2 kg 79.9 kg    Intake/Output:    Intake/Output Summary (Last 24 hours) at 01/24/2020 1524 Last data filed at 01/24/2020 1429 Gross per 24 hour  Intake 831.19 ml  Output 600 ml  Net 231.19 ml     Physical Exam: General:  No acute distress, chronically ill-appearing, cachectic  HEENT  moist oral mucous membranes, temporal wasting  Pulm/lungs  room air, normal breathing effort  CVS/Heart  regular rhythm  Abdomen:   Soft, nontender  Extremities:  No peripheral edema  Neurologic:  Awake, alert, nonconversant  Skin:  Warm, dry    Basic Metabolic Panel:  Recent Labs  Lab 01/19/20 0417 01/19/20 0417 01/20/20 0521 01/20/20 0521 01/21/20 0439 01/21/20 0439 01/22/20 0704 01/23/20 0741 01/24/20 0445  NA 143   < > 149*  --  145  --  139 138 137  K 3.0*   < > 3.9  --  3.9  --  4.6 4.3 4.2  CL 110   < > 114*  --  110  --  109 109 106  CO2 21*   < > 23  --  20*  --  18* 15* 17*  GLUCOSE 106*   < > 122*  --  133*  --  137* 117* 113*  BUN 152*   < > 122*  --  102*  --  108* 107* 105*  CREATININE 6.03*   < > 5.00*  --  4.58*  --  6.23* 6.31* 5.54*  CALCIUM 8.6*   < > 8.8*   < > 9.3   < > 9.0 9.2 8.5*  PHOS 4.8*  --   --   --   --   --  2.9  --   --    < > = values in this  interval not displayed.     CBC: No results for input(s): WBC, NEUTROABS, HGB, HCT, MCV, PLT in the last 168 hours.   No results found for: HEPBSAG, HEPBSAB, HEPBIGM    Microbiology:  No results found for this or any previous visit (from the past 240 hour(s)).  Coagulation Studies: No results for input(s): LABPROT, INR in the last 72 hours.  Urinalysis: No results for input(s): COLORURINE, LABSPEC, PHURINE, GLUCOSEU, HGBUR, BILIRUBINUR, KETONESUR, PROTEINUR, UROBILINOGEN, NITRITE, LEUKOCYTESUR in the last 72 hours.  Invalid input(s): APPERANCEUR    Imaging: US RENAL  Result Date: 01/24/2020 CLINICAL DATA:  Acute failure. EXAM: RENAL / URINARY TRACT ULTRASOUND COMPLETE COMPARISON:  Renal ultrasound 01/08/2020 FINDINGS: Right Kidney: Renal measurements: 10.2 x 5.9 x 6.5 cm = volume: 205 mL. Right kidney is isoechoic to the liver, the index organ. No mass lesion or stone is present. No hydronephrosis is present. Left Kidney: Renal measurements: 11.8 x 6.7 x 6.3 cm = volume: 257 mL.  Left kidney is isoechoic to the index the or and, the spleen. The renal collecting system is not dilated. Mild hydronephrosis is present. No proximal obstruction is evident. Bladder: Echogenic material is layering within the urinary bladder, new from 2 weeks ago. There is no vascular flow to the layering material. The urinary bladder is otherwise unremarkable. Other: None. IMPRESSION: 1. New left-sided mild hydronephrosis. 2. Layering echogenic debris within the urinary bladder may represent hemorrhage or infection. 3. The kidneys are isoechoic to the index organ is bilaterally. This is nonspecific, but can be seen in the setting of medical renal disease. Electronically Signed   By: San Morelle M.D.   On: 01/24/2020 09:38     Medications:   . sodium chloride Stopped (01/20/20 0158)  . dextrose 75 mL/hr at 01/24/20 0849   . mouth rinse  15 mL Mouth Rinse q12n4p   sodium chloride, acetaminophen  **OR** acetaminophen, ondansetron (ZOFRAN) IV  Assessment/ Plan:  69 y.o. male with history of mental retardation, hypertension, hyperlipidemia, stroke 2019, BPH, resident of [place] home Covid virus infection 12/ 2019 admitted on 01/08/2020 for Acute kidney failure, unspecified ([place]) [N17.9] Hypernatremia [E87.0] Altered mental status, unspecified altered mental status type [R41.82]  #Acute renal failure/mild left-sided hydronephrosis noted on repeat renal ultrasound 01/24/2020 Baseline creatinine of 0.95 in February 2020 Admission creatinine of 3.15, BUN 146 AKI likely secondary to ATN from Severe dehydration.  Recent history of COVID-19 infection in December 2020 Renal U/S neg for obstruction -Urine output 950 cc over the preceding 24 hours.  Creatinine down to 5.5.  Continue IV fluid hydration at this time.  Mild new left-sided hydronephrosis noted.  Obtain CT scan of the abdomen and pelvis for further evaluation.  #Severe hypernatremia secondary to dehydration  serum sodium now 137.  # Hypokalemia Potassium acceptable at 4.2 with potassium supplemented IV fluids.  Patient's niece - Ms [name] [phone]   LOS: 23 Alfio Loescher Schurz 1/29/20213:24 PM  [place], [place]  Note: This note was prepared with Dragon dictation. Any transcription errors are unintentional

## 2020-01-25 DIAGNOSIS — R310 Gross hematuria: Secondary | ICD-10-CM

## 2020-01-25 DIAGNOSIS — K2900 Acute gastritis without bleeding: Secondary | ICD-10-CM

## 2020-01-25 LAB — MAGNESIUM: Magnesium: 1.8 mg/dL (ref 1.7–2.4)

## 2020-01-25 LAB — BASIC METABOLIC PANEL
Anion gap: 12 (ref 5–15)
BUN: 94 mg/dL — ABNORMAL HIGH (ref 8–23)
CO2: 18 mmol/L — ABNORMAL LOW (ref 22–32)
Calcium: 8.7 mg/dL — ABNORMAL LOW (ref 8.9–10.3)
Chloride: 107 mmol/L (ref 98–111)
Creatinine, Ser: 4.19 mg/dL — ABNORMAL HIGH (ref 0.61–1.24)
GFR calc Af Amer: 16 mL/min — ABNORMAL LOW (ref >60)
GFR calc non Af Amer: 13 mL/min — ABNORMAL LOW (ref >60)
Glucose, Bld: 137 mg/dL — ABNORMAL HIGH (ref 70–99)
Potassium: 4.6 mmol/L (ref 3.5–5.1)
Sodium: 137 mmol/L (ref 135–145)

## 2020-01-25 LAB — CBC
HCT: 34.2 % — ABNORMAL LOW (ref 39.0–52.0)
HCT: 30.8 % — ABNORMAL LOW (ref 39.0–52.0)
Hemoglobin: 10.7 g/dL — ABNORMAL LOW (ref 13.0–17.0)
Hemoglobin: 9.9 g/dL — ABNORMAL LOW (ref 13.0–17.0)
MCH: 27.6 pg (ref 26.0–34.0)
MCH: 28 pg (ref 26.0–34.0)
MCHC: 31.3 g/dL (ref 30.0–36.0)
MCHC: 32.1 g/dL (ref 30.0–36.0)
MCV: 88.1 fL (ref 80.0–100.0)
MCV: 87 fL (ref 80.0–100.0)
Platelets: 216 10*3/uL (ref 150–400)
Platelets: 223 10*3/uL (ref 150–400)
RBC: 3.88 MIL/uL — ABNORMAL LOW (ref 4.22–5.81)
RBC: 3.54 MIL/uL — ABNORMAL LOW (ref 4.22–5.81)
RDW: 15.8 % — ABNORMAL HIGH (ref 11.5–15.5)
RDW: 15.6 % — ABNORMAL HIGH (ref 11.5–15.5)
WBC: 7.2 10*3/uL (ref 4.0–10.5)
WBC: 5.9 10*3/uL (ref 4.0–10.5)
nRBC: 0 % (ref 0.0–0.2)
nRBC: 0 % (ref 0.0–0.2)

## 2020-01-25 LAB — PHOSPHORUS: Phosphorus: 5.2 mg/dL — ABNORMAL HIGH (ref 2.5–4.6)

## 2020-01-25 MED ORDER — FREE WATER
120.0000 mL | Status: DC
  Administered 2020-01-25 – 2020-01-29 (×22): 120 mL

## 2020-01-25 MED ORDER — TAMSULOSIN HCL 0.4 MG PO CAPS
0.4000 mg | ORAL_CAPSULE | Freq: Every day | ORAL | Status: DC
  Administered 2020-01-25 – 2020-01-29 (×5): 0.4 mg via ORAL
  Filled 2020-01-25 (×5): qty 1

## 2020-01-25 MED ORDER — FINASTERIDE 5 MG PO TABS
5.0000 mg | ORAL_TABLET | Freq: Every day | ORAL | Status: DC
  Administered 2020-01-25 – 2020-01-29 (×5): 5 mg via ORAL
  Filled 2020-01-25 (×5): qty 1

## 2020-01-25 MED ORDER — JEVITY 1.5 CAL/FIBER PO LIQD
1000.0000 mL | ORAL | Status: DC
  Administered 2020-01-25: 16:00:00 1000 mL

## 2020-01-25 MED ORDER — JEVITY 1.5 CAL/FIBER PO LIQD: 1000.0000 mL | ORAL | Status: DC

## 2020-01-25 NOTE — Consult Note (Signed)
[place] for Electrolyte Monitoring and Replacement   Recent Labs: Potassium (mmol/L)  Date Value  01/25/2020 4.6   Magnesium (mg/dL)  Date Value  01/25/2020 1.8   Calcium (mg/dL)  Date Value  01/25/2020 8.7 (L)   Albumin (g/dL)  Date Value  01/22/2020 2.6 (L)   Phosphorus (mg/dL)  Date Value  01/25/2020 5.2 (H)   Sodium (mmol/L)  Date Value  01/25/2020 137   Corrected Ca: 10.1 mg/dL  Assessment: 68 y.o.malewith medical history significant ofmental retardation, hypertension, hyperlipidemia, stroke, BPH, who presents with altered mental status. His renal function is poor but improving. He is not on HD due to conservative management in the setting of continuing to make urine. His electrolytes have been stable. He remains on D5 for hypernatremia, which has resolved  Goal of Therapy:  Electrolytes WNL  Plan:   No electrolyte replacement warranted at this time  F/U electrolytes in am  Dallie Piles ,PharmD, BCPS Clinical Pharmacist 01/25/2020 10:13 AM

## 2020-01-25 NOTE — Progress Notes (Signed)
Rachael Fee Nurse practioner notified pt's foley draining red colored urine with clots noted

## 2020-01-25 NOTE — Progress Notes (Signed)
Was notified of RN overnight of 1/29 regarding gross hematuria after foley placement.  Previous ultrasound results and attending note reviewed. Hematuria no unexpected find. CBC scheduled for am. Foley remained patent with color change from bright red to darker red tinged urine. Hemoglobin on am labs stable at 10.7.

## 2020-01-25 NOTE — Progress Notes (Addendum)
[place] [facility] [place] NAME: Samuel Shelton    MR#:  [id number]  DATE OF BIRTH:  December 24, 1951  SUBJECTIVE:  Does not respond to VC. Looking around and tracking voice Getting IVF No fever Started having hematuria last night. Foley placed REVIEW OF SYSTEMS:   Review of Systems  Unable to perform ROS: Medical condition  remains confused, nonverbal DRUG ALLERGIES:  No Known Allergies  VITALS:  Blood pressure (!) 152/98, pulse 91, temperature 97.6 F (36.4 C), temperature source Oral, resp. rate 20, height 6' (1.829 m), weight 79.9 kg, SpO2 97 %.  PHYSICAL EXAMINATION:   Physical Exam-- limited exam due pt unable to participate  GENERAL:  69 y.o.-year-old patient lying in the bed with no acute distress.  LUNGS: Normal breath sounds bilaterally, no wheezing No use of accessory muscles of respiration.  CARDIOVASCULAR: S1, S2 normal.   foley catheter placed+--hematuria NEUROLOGIC: confused  Nonverbal/does not communicate  SKIN per RN--POA  Pressure Injury 01/11/20 Heel Left (Active)  01/11/20 0137  Location: Heel  Location Orientation: Left  Staging:   Wound Description (Comments):   Present on Admission:      Pressure Injury 01/22/20 Heel Right Deep Tissue Pressure Injury - Purple or maroon localized area of discolored intact skin or blood-filled blister due to damage of underlying soft tissue from pressure and/or shear. dark purple (Active)  01/22/20 1427  Location: Heel  Location Orientation: Right  Staging: Deep Tissue Pressure Injury - Purple or maroon localized area of discolored intact skin or blood-filled blister due to damage of underlying soft tissue from pressure and/or shear.  Wound Description (Comments): dark purple  Present on Admission:    LABORATORY PANEL:  CBC Recent Labs  Lab 01/25/20 0449  WBC 7.2  HGB 10.7*  HCT 34.2*  PLT 216    Chemistries  Recent Labs  Lab 01/25/20 0449  NA 137  K 4.6  CL 107   CO2 18*  GLUCOSE 137*  BUN 94*  CREATININE 4.19*  CALCIUM 8.7*  MG 1.8   Cardiac Enzymes No results for input(s): TROPONINI in the last 168 hours. RADIOLOGY:  CT ABDOMEN PELVIS WO CONTRAST  Result Date: 01/24/2020 CLINICAL DATA:  69 year old Samuel Shelton with acute kidney injury/renal failure. New left side hydronephrosis. History of COVID-19. Last month. EXAM: CT ABDOMEN AND PELVIS WITHOUT CONTRAST TECHNIQUE: Multidetector CT imaging of the abdomen and pelvis was performed following the standard protocol without IV contrast. COMPARISON:  Renal ultrasound today and earlier this month. FINDINGS: Lower chest: No cardiomegaly or pericardial effusion. No pleural effusion. Coarse bilateral lower lobe peribronchial opacity more resembles infection than atelectasis. Negative middle lobes. Hepatobiliary: Streak artifact from the patient's arms. Negative noncontrast liver and gallbladder. Pancreas: Negative. Spleen: Negative. Adrenals/Urinary Tract: Normal adrenal glands. Mild to moderate bilateral hydronephrosis and hydroureter. Both ureters remain dilated into the pelvis, and taper at the bilateral UVJ although no obstructing calculus is identified. Urinary bladder is distended with mild bladder wall thickening and perivesical stranding. Estimated bladder volume 862 milliliters. No nephrolithiasis. No obstructing calculus identified although there may be punctate calculi layering within the bladder (series 2, image 74). Stomach/Bowel: Redundant sigmoid colon. Negative rectum. There is some fluid in the descending colon. Combination of fluid and stool in the transverse colon. Fluid in the right colon. But no convincing large bowel wall thickening. Normal appendix suspected on series 2, image 51. Motion artifact at the ileocecal valve. No dilated small bowel. Percutaneous gastrostomy tube in place with decompressed  stomach. No free air. No free fluid identified. Vascular/Lymphatic: Vascular patency is not evaluated in  the absence of IV contrast. Mild iliac artery calcified atherosclerosis. Reproductive: Within normal limits. Other: Mild presacral stranding. No pelvic free fluid. Musculoskeletal: Osteopenia. Chronic L5 pars fractures. Superimposed lumbar spine degeneration. No acute osseous abnormality identified. IMPRESSION: 1. Bilateral hydronephrosis and hydroureter appears secondary to marked bladder distension with estimated bladder volume 862 mL. No obstructing calculus identified although there may be punctate calculi layering within the bladder. Bladder wall thickening and perivesical stranding raises the possibility of superimposed bladder infection. Recommend Foley catheter decompression. 2. Coarse bilateral lower lobe peribronchial opacity more resembles infection than atelectasis and might be remnant of COVID-19 in this setting. 3. Percutaneous gastrostomy with no adverse features. No bowel obstruction. Electronically Signed   By: Genevie Ann M.D.   On: 01/24/2020 19:18   US RENAL  Result Date: 01/24/2020 CLINICAL DATA:  Acute failure. EXAM: RENAL / URINARY TRACT ULTRASOUND COMPLETE COMPARISON:  Renal ultrasound 01/08/2020 FINDINGS: Right Kidney: Renal measurements: 10.2 x 5.9 x 6.5 cm = volume: 205 mL. Right kidney is isoechoic to the liver, the index organ. No mass lesion or stone is present. No hydronephrosis is present. Left Kidney: Renal measurements: 11.8 x 6.7 x 6.3 cm = volume: 257 mL. Left kidney is isoechoic to the index the or and, the spleen. The renal collecting system is not dilated. Mild hydronephrosis is present. No proximal obstruction is evident. Bladder: Echogenic material is layering within the urinary bladder, new from 2 weeks ago. There is no vascular flow to the layering material. The urinary bladder is otherwise unremarkable. Other: None. IMPRESSION: 1. New left-sided mild hydronephrosis. 2. Layering echogenic debris within the urinary bladder may represent hemorrhage or infection. 3. The  kidneys are isoechoic to the index organ is bilaterally. This is nonspecific, but can be seen in the setting of medical renal disease. Electronically Signed   By: San Morelle M.D.   On: 01/24/2020 09:38   ASSESSMENT AND PLAN:  Samuel Shelton is a 69 y.o. Samuel Shelton with medical history significant of mental retardation, hypertension, hyperlipidemia, stroke, BPH, who presents with altered mental status.  Hematuria with h/o Prostate adenocarcinoma (monitored by Dr Erlene Quan by PSA score) -pt was seen last by urology 02/2019 -pt has h/o incomplete voiding--was on flomax and finasteride -US renal (01/24/20)showed hydronephrosis with bladder debris/hemorrhage -CT abd ( 01/24/20) Bilateral hydronephrosis and hydroureter appears29 secondary to marked bladder distension with estimated bladder volume 862 mL. No obstructing calculus identified although there may be punctate calculi layering within the bladder. Bladder wall thickening and perivesical stranding raises the possibility of superimposed bladder infection. -Foley catheter placed--for acute retention-->draining dark urine with clotts -Empiric IV rocephin for possible Acute cystitis. -wbc 7.2, no fever -UC pending -Urology consult with Dr sninsky--aware  Acute gastritis/PUD noted on EGD -IV PPI bid  Severe Hypernatremia: Na 170 on admission   -ultrasound renal essentially nothing acute - Serum Sodium 138 today, continue intravenous D5W per nephrology  AKI with ATN secondary to severe dehydration  -Nephrology following.   urine output reasonable--now bloody with clotts -creat 5.5--4.19  Chronic mental disorder and acute metabolic encephalopathy, uremic -CT head is negative for acute intracranial abnormalities, likely due to electrolytes disturbance and worsening renal function -psychiatry input noted. Discussed with Dr. Roney Marion input  Hypercalcemia/ Hypokalemia Resolved  Moderate to severe protein calorie  nutrition Family (nieces and nephew) has decided for PEG tube feeding. -GI consult with Dr Dawson Bills placed 1/29-Dietitian consulted--start tube feeds  today  Recent COVID-19 virus infection: - Patient had a positive COVID-19 on 12/12/2020. -pt DOES NOT need airborne isolation per infection control  Overallguarded prognosis.    Family communication : Software engineer updated on phone today -Consults : nephrology, palliative care, GI, Urology  discharge Disposition : SNF with outpatient Palliative, then transition to long term care Discharge barrier:  starting PEG feeding, hematuria w/u  CODE STATUS:DNR DVT Prophylaxis SCD (has hematuria)  TOTAL TIME TAKING CARE OF THIS PATIENT: *25* minutes.  >50% time spent on counselling and coordination of care  Note: This dictation was prepared with Dragon dictation along with smaller phrase technology. Any transcriptional errors that result from this process are unintentional.  Fritzi Mandes M.D on 01/25/2020 at 8:36 AM  [facility]   CC: Primary care physician; Sofie Hartigan, MDPatient ID: Samuel Shelton, Samuel Shelton   DOB: 1951/04/23, 69 y.o.   MRN: [id number]

## 2020-01-25 NOTE — Consult Note (Signed)
01/25/20 4:06 PM   Samuel Shelton [birth year] [id number]  Reason for consult: Gross hematuria  Physician requesting consult: Dr. Posey Pronto  HPI: I was asked to see Mr. Olivos in consultation for gross hematuria from Dr. Posey Pronto.  He is a 69 year old extremely co-morbid male with past medical history notable for mental retardation, stroke, low risk prostate cancer on active surveillance by Dr. Erlene Quan.  The patient is non-verbal, and the history is obtained entirely from chart review and the nurse.  At baseline, he has incomplete bladder emptying with PVRs ~266ml, and recurrent UTIs.  He is reportedly on finasteride and Flomax at baseline for incomplete bladder emptying, however is unclear if he is taking these medications.  He has had a prolonged hospitalization for over 2 weeks for hypernatremia, AKI, and altered mental status, and he also had Covid in December 2020.  An ultrasound performed on 01/08/2020 early in his hospitalization showed no evidence of hydronephrosis or distended bladder.  Secondary to worsening renal function another ultrasound was performed on 01/24/2020 that showed new left sided mild hydronephrosis and likely clot in the bladder.  Urinalysis at this time was also concerning for infection with urinalysis 01/24/2020 showing moderate leukocytes, greater than 50 WBCs, many bacteria, WBC clumps.  Culture is pending.  A CT was performed for follow-up of the hydronephrosis on ultrasound and showed a markedly distended bladder with volume of 850 mL, bilateral upstream hydroureteronephrosis, and no evidence of significant clot in the bladder.  A 16 French two-way Foley was placed by nursing yesterday, and he has developed maroon-colored urine in the interval.  Creatinine this hospitalization has ranged from 2.8-9.4, elevated from a baseline of 0.9.  Most recent creatinine today is 4.19.  Hematocrit today is relatively stable at 34 from the 6 on 01/17/2020.  His CODE STATUS is  DNR.  PMH: Past Medical History:  Diagnosis Date  . Chronic mental illness   . Edema of both legs   . Elevated PSA   . Elevated PSA   . Hypertension   . Mental retardation   . Phimosis     Surgical History: Past Surgical History:  Procedure Laterality Date  . PEG PLACEMENT N/A 01/24/2020   Procedure: PERCUTANEOUS ENDOSCOPIC GASTROSTOMY (PEG) PLACEMENT;  Surgeon: [name], MD;  Location: [facility] ENDOSCOPY;  Service: Endoscopy;  Laterality: N/A;  . PROSTATE BIOPSY N/A 06/22/2015   Procedure: PROSTATE BIOPSY;  Surgeon: Hollice Espy, MD;  Location: [facility] ORS;  Service: Urology;  Laterality: N/A;  . PROSTATE BIOPSY N/A 09/27/2016   Procedure: PROSTATE BIOPSY;  Surgeon: Hollice Espy, MD;  Location: [facility] ORS;  Service: Urology;  Laterality: N/A;    Allergies: No Known Allergies  Family History: Family History  Problem Relation Age of Onset  . Stroke Mother   . Hypertension Sister   . Diabetes type II Sister     Social History:  reports that he has quit smoking. His smoking use included cigarettes. He smoked 0.50 packs per day. He has never used smokeless tobacco. He reports that he does not drink alcohol or use drugs.  ROS: Unable to obtain secondary to patient condition  Physical Exam: BP 103/76 (BP Location: Right Arm)   Pulse 86   Temp 98.8 F (37.1 C) (Oral)   Resp 18   Ht 6' (1.829 m)   Wt 79.9 kg   SpO2 100%   BMI 23.90 kg/m    Constitutional: Nonverbal, in restraining minutes Cardiovascular: No clubbing, cyanosis, or edema. Respiratory: Normal respiratory effort, no increased work  of breathing. GI: Abdomen is soft, nontender, nondistended, no abdominal masses Lymph: No cervical or inguinal lymphadenopathy. Skin: No rashes, bruises or suspicious lesions. Psychiatric: Nonverbal, noncommunicative  Laboratory Data: Reviewed, see HPI Urine culture pending  Procedure: Bladder irrigation The patient's indwelling 16 French two-way Foley catheter was irrigated  with 1 L of normal saline with return of 20 cc old clot.  The urine lightened from maroon to dark pink and irrigated easily.  The patient did not tolerate irrigation well and required mitts and nursing staff to restrain his arms for safe irrigation.  Pertinent Imaging: I have personally reviewed the renal ultrasounds and CT, see HPI for details.  Assessment & Plan:   In summary, the patient is an extremely co-morbid and non-verbal 69 year old male with prolonged 3-week hospitalization secondary to hypernatremia and worsening renal function, now with new UTI, urinary retention, and bilateral upstream hydroureteronephrosis with hematuria after 47F Foley placement yesterday.  He has low risk prostate cancer, and this is less likely to be the source of his hematuria.  More likely etiologies would include active UTI, Foley trauma especially in the setting of his developmental delay and pulling at lines, or decompressive hematuria.  I recommended upsizing the Foley catheter to improve drainage, but the patient's family member in the room requested his catheter not be changed because it would be too painful for him.  She does understand that if this catheter does not drain and he has persistent retention, would require catheter upsizing.  -Continue antibiotics for UTI, follow-up urine culture and narrow antibiotics as able.  Recommend 2-week course of antibiotics total -Continue flomax and finasteride -Maintain Foley, okay to hand irrigate as needed.  If unable to hand irrigate with elevated bladder scan, would recommend upsizing Foley to a 20 [place] two-way Foley to improve drainage. -Hold all anticoagulation -With his pulling at lines and high likelihood of self removing his Foley, would recommend discontinuing Foley as soon as possible -Urology will continue to follow  Billey Co, [place] [address] [place], [place] [address] [phone]

## 2020-01-25 NOTE — Consult Note (Signed)
[place] for Electrolyte Monitoring and Replacement   Recent Labs: Potassium (mmol/L)  Date Value  01/25/2020 4.6   Magnesium (mg/dL)  Date Value  01/25/2020 1.8   Calcium (mg/dL)  Date Value  01/25/2020 8.7 (L)   Albumin (g/dL)  Date Value  01/22/2020 2.6 (L)   Phosphorus (mg/dL)  Date Value  01/25/2020 5.2 (H)   Sodium (mmol/L)  Date Value  01/25/2020 137   Corrected Ca: 9.6 mg/dL  Assessment: 68 y.o.malewith medical history significant ofmental retardation, hypertension, hyperlipidemia, stroke, BPH, who presents with altered mental status. His renal function is poor but improving. He is not on HD due to conservative management in the setting of continuing to make urine.   D5 @ 75 ml/hr PEG tube placed 1/29  Goal of Therapy:  Electrolytes WNL  Plan:   No electrolyte replacement warranted at this time   Per dietitian, pt at high re-feed risk with PEG tube planned for today, will need to watch K, Mag, Phos for a few days - labs ordered for AM    Noralee Space ,PharmD Clinical Pharmacist 01/25/2020 10:10 AM

## 2020-01-25 NOTE — Progress Notes (Signed)
Samuel Lame, MD [facility]   [address], [place], [place] [address] Phone: [phone] Fax : [phone]   Subjective: Patient is nonverbal had a PEG tube placed yesterday and his abdomen soft.  There is positive bowel sounds.  He tolerated the PEG tube placement well.   Objective: Vital signs in last 24 hours: Vitals:   01/24/20 1518 01/24/20 2124 01/25/20 0410 01/25/20 1231  BP: 108/76 (!) 127/93 (!) 152/98 103/76  Pulse: 88 72 91 86  Resp: 20 20 20 18   Temp: 98.7 F (37.1 C) 98.8 F (37.1 C) 97.6 F (36.4 C) 98.8 F (37.1 C)  TempSrc: Oral Oral Oral Oral  SpO2: 99% 100% 97% 100%  Weight:      Height:       Weight change:   Intake/Output Summary (Last 24 hours) at 01/25/2020 1335 Last data filed at 01/25/2020 1232 Gross per 24 hour  Intake 4448.43 ml  Output 300 ml  Net 4148.43 ml     Exam: Heart:: Regular rate and rhythm, S1S2 present or without murmur or extra heart sounds Lungs: normal and clear to auscultation and percussion Abdomen: soft, nontender, normal bowel sounds.   Lab Results: @LABTEST2 @ Micro Results: No results found for this or any previous visit (from the past 240 hour(s)). Studies/Results: CT ABDOMEN PELVIS WO CONTRAST  Result Date: 01/24/2020 CLINICAL DATA:  69 year old male with acute kidney injury/renal failure. New left side hydronephrosis. History of COVID-19. Last month. EXAM: CT ABDOMEN AND PELVIS WITHOUT CONTRAST TECHNIQUE: Multidetector CT imaging of the abdomen and pelvis was performed following the standard protocol without IV contrast. COMPARISON:  Renal ultrasound today and earlier this month. FINDINGS: Lower chest: No cardiomegaly or pericardial effusion. No pleural effusion. Coarse bilateral lower lobe peribronchial opacity more resembles infection than atelectasis. Negative middle lobes. Hepatobiliary: Streak artifact from the patient's arms. Negative noncontrast liver and gallbladder. Pancreas: Negative. Spleen: Negative.  Adrenals/Urinary Tract: Normal adrenal glands. Mild to moderate bilateral hydronephrosis and hydroureter. Both ureters remain dilated into the pelvis, and taper at the bilateral UVJ although no obstructing calculus is identified. Urinary bladder is distended with mild bladder wall thickening and perivesical stranding. Estimated bladder volume 862 milliliters. No nephrolithiasis. No obstructing calculus identified although there may be punctate calculi layering within the bladder (series 2, image 74). Stomach/Bowel: Redundant sigmoid colon. Negative rectum. There is some fluid in the descending colon. Combination of fluid and stool in the transverse colon. Fluid in the right colon. But no convincing large bowel wall thickening. Normal appendix suspected on series 2, image 51. Motion artifact at the ileocecal valve. No dilated small bowel. Percutaneous gastrostomy tube in place with decompressed stomach. No free air. No free fluid identified. Vascular/Lymphatic: Vascular patency is not evaluated in the absence of IV contrast. Mild iliac artery calcified atherosclerosis. Reproductive: Within normal limits. Other: Mild presacral stranding. No pelvic free fluid. Musculoskeletal: Osteopenia. Chronic L5 pars fractures. Superimposed lumbar spine degeneration. No acute osseous abnormality identified. IMPRESSION: 1. Bilateral hydronephrosis and hydroureter appears secondary to marked bladder distension with estimated bladder volume 862 mL. No obstructing calculus identified although there may be punctate calculi layering within the bladder. Bladder wall thickening and perivesical stranding raises the possibility of superimposed bladder infection. Recommend Foley catheter decompression. 2. Coarse bilateral lower lobe peribronchial opacity more resembles infection than atelectasis and might be remnant of COVID-19 in this setting. 3. Percutaneous gastrostomy with no adverse features. No bowel obstruction. Electronically Signed    By: Genevie Ann M.D.   On: 01/24/2020  19:18   US RENAL  Result Date: 01/24/2020 CLINICAL DATA:  Acute failure. EXAM: RENAL / URINARY TRACT ULTRASOUND COMPLETE COMPARISON:  Renal ultrasound 01/08/2020 FINDINGS: Right Kidney: Renal measurements: 10.2 x 5.9 x 6.5 cm = volume: 205 mL. Right kidney is isoechoic to the liver, the index organ. No mass lesion or stone is present. No hydronephrosis is present. Left Kidney: Renal measurements: 11.8 x 6.7 x 6.3 cm = volume: 257 mL. Left kidney is isoechoic to the index the or and, the spleen. The renal collecting system is not dilated. Mild hydronephrosis is present. No proximal obstruction is evident. Bladder: Echogenic material is layering within the urinary bladder, new from 2 weeks ago. There is no vascular flow to the layering material. The urinary bladder is otherwise unremarkable. Other: None. IMPRESSION: 1. New left-sided mild hydronephrosis. 2. Layering echogenic debris within the urinary bladder may represent hemorrhage or infection. 3. The kidneys are isoechoic to the index organ is bilaterally. This is nonspecific, but can be seen in the setting of medical renal disease. Electronically Signed   By: San Morelle M.D.   On: 01/24/2020 09:38   Medications: I have reviewed the patient's current medications. Scheduled Meds: . finasteride  5 mg Oral Daily  . free water  120 mL Per Tube Q4H  . mouth rinse  15 mL Mouth Rinse q12n4p  . pantoprazole (PROTONIX) IV  40 mg Intravenous Q12H  . tamsulosin  0.4 mg Oral Daily   Continuous Infusions: . sodium chloride Stopped (01/20/20 0158)  . cefTRIAXone (ROCEPHIN)  IV Stopped (01/24/20 1910)  . dextrose 75 mL/hr at 01/25/20 1012  . feeding supplement (JEVITY 1.5 CAL/FIBER)     PRN Meds:.sodium chloride, acetaminophen **OR** acetaminophen, ondansetron (ZOFRAN) IV   Assessment: Principal Problem:   Hypernatremia Active Problems:   Chronic mental disorder   HTN (hypertension)   Stroke (cerebrum)  (HCC)   Hyperlipidemia   BPH (benign prostatic hyperplasia)   Hypercalcemia   Acute metabolic encephalopathy   COVID-19 virus infection   Acute renal failure (HCC)   Dehydration   Elevated troponin   Lactic acidosis   DNR (do not resuscitate) discussion   Protein-calorie malnutrition, severe ([place])   Palliative care by specialist   Oropharyngeal dysphagia   Acute gastritis without hemorrhage   Postpyloric ulcer   Gross hematuria    Plan: This patient had PEG tube placement yesterday and has been without any issues overnight.  The patient can use the tube for feeding.  Nothing further to do from a GI point of view.  I will sign off.  Please call if any further GI concerns or questions.  We would like to thank you for the opportunity to participate in the care of Samuel Shelton.    LOS: 17 days   Samuel Shelton 01/25/2020, 1:35 PM Pager [phone] 7am-5pm  Check AMION for 5pm -7am coverage and on weekends

## 2020-01-25 NOTE — Progress Notes (Signed)
Samuel Shelton  MRN: [id number]  DOB/AGE: June 17, 1951 69 y.o.  Primary Care Physician:Feldpausch, Chrissie Noa, MD  Admit date: 01/08/2020  Chief Complaint:  Chief Complaint  Patient presents with  . Altered Mental Status    S-Pt presented on  01/08/2020 with  Chief Complaint  Patient presents with  . Altered Mental Status  .    Pt offers no new complaints   Medications . finasteride  5 mg Oral Daily  . mouth rinse  15 mL Mouth Rinse q12n4p  . pantoprazole (PROTONIX) IV  40 mg Intravenous Q12H  . tamsulosin  0.4 mg Oral Daily         [id number] to get any data   Physical Exam: Vital signs in last 24 hours: Temp:  [97.6 F (36.4 C)-98.8 F (37.1 C)] 97.6 F (36.4 C) (01/30 0410) Pulse Rate:  [72-93] 91 (01/30 0410) Resp:  [15-20] 20 (01/30 0410) BP: (100-152)/(71-98) 152/98 (01/30 0410) SpO2:  [95 %-100 %] 97 % (01/30 0410) Weight change:  Last BM Date: 01/24/20  Intake/Output from previous day: 01/29 0701 - 01/30 0700 In: 3448.4 [I.V.:1698.4; IV Piggyback:100] Out: 300 [Urine:300] Total I/O In: 675 [Other:675] Out: -    Physical Exam: General-chronically ill-appearing, cachectic  resp- No acute REsp distress, CTA B/L NO Rhonchi CVS- S1S2 regular in rate and rhythm GIT- BS+, soft, NT, ND, PEG tube in situ EXT- NO LE Edema, Cyanosis GU- Foley cath in situ   Lab Results: CBC Recent Labs    01/25/20 0449  WBC 7.2  HGB 10.7*  HCT 34.2*  PLT 216    BMET Recent Labs    01/24/20 0445 01/25/20 0449  NA 137 137  K 4.2 4.6  CL 106 107  CO2 17* 18*  GLUCOSE 113* 137*  BUN 105* 94*  CREATININE 5.54* 4.19*  CALCIUM 8.5* 8.7*    Creatinine trend 2021 3.1==> 9.4==>4.1 2020  0.9   Sodium trend 170=>137  CO2 trend 15==>18   MICRO No results found for this or any previous visit (from the past 240 hour(s)).    Lab Results  Component Value Date   CALCIUM 8.7 (L) 01/25/2020   PHOS 5.2 (H) 01/25/2020                Impression: 1)Renal  AKI secondary to ATN AKI secondary to dehydration  There is some component of obstructive uropathy as well as patient CT scan has shown obstruction/hydronephrosis  AKI is now better Creatinine is improving Urine output is better than before    2)HTN Blood pressure at goal for the acute state   3)Anemia of chronic disease  HGb at goal (9--11) No need for Epogen  4) hypernatremia Now better  5) hypokalemia Now better  6) Acid base Co2 not at goal But improving  7) Hematuria Sec to acute cystis  On IV antibiotics Primary team following    Plan:   We will continue current treatment    Samuel Shelton s Theador Hawthorne 01/25/2020, 9:49 AM

## 2020-01-25 NOTE — Progress Notes (Signed)
Rachael Fee NP notified for order for HGB, orders given

## 2020-01-25 NOTE — Progress Notes (Signed)
Rachael Fee NP in to see pt.

## 2020-01-26 LAB — URINE CULTURE: Culture: >=100000 — AB

## 2020-01-26 LAB — BASIC METABOLIC PANEL
Anion gap: 11 (ref 5–15)
BUN: 73 mg/dL — ABNORMAL HIGH (ref 8–23)
CO2: 18 mmol/L — ABNORMAL LOW (ref 22–32)
Calcium: 8.3 mg/dL — ABNORMAL LOW (ref 8.9–10.3)
Chloride: 108 mmol/L (ref 98–111)
Creatinine, Ser: 2.33 mg/dL — ABNORMAL HIGH (ref 0.61–1.24)
GFR calc Af Amer: 32 mL/min — ABNORMAL LOW (ref >60)
GFR calc non Af Amer: 27 mL/min — ABNORMAL LOW (ref >60)
Glucose, Bld: 131 mg/dL — ABNORMAL HIGH (ref 70–99)
Potassium: 3.5 mmol/L (ref 3.5–5.1)
Sodium: 137 mmol/L (ref 135–145)

## 2020-01-26 LAB — PHOSPHORUS: Phosphorus: 3.4 mg/dL (ref 2.5–4.6)

## 2020-01-26 LAB — MAGNESIUM: Magnesium: 1.7 mg/dL (ref 1.7–2.4)

## 2020-01-26 MED ORDER — JEVITY 1.5 CAL/FIBER PO LIQD
1000.0000 mL | ORAL | Status: DC
  Administered 2020-01-26: 1000 mL

## 2020-01-26 MED ORDER — MAGNESIUM SULFATE IN D5W 1-5 GM/100ML-% IV SOLN
1.0000 g | Freq: Once | INTRAVENOUS | Status: AC
  Administered 2020-01-26: 10:00:00 1 g via INTRAVENOUS
  Filled 2020-01-26: qty 100

## 2020-01-26 MED ORDER — CHLORHEXIDINE GLUCONATE CLOTH 2 % EX PADS
6.0000 | MEDICATED_PAD | Freq: Every day | CUTANEOUS | Status: DC
  Administered 2020-01-26 – 2020-01-29 (×4): 6 via TOPICAL

## 2020-01-26 NOTE — Progress Notes (Addendum)
   Subjective No acute issues overnight Foley draining well with 750 urine output overnight, did not require irrigation Patient resting comfortably this morning  Physical Exam: BP 120/78 (BP Location: Right Arm)   Pulse 86   Temp 98.4 F (36.9 C) (Oral)   Resp 16   Ht 6' (1.829 m)   Wt 79.9 kg   SpO2 100%   BMI 23.90 kg/m    Constitutional: Sleeping Respiratory: Normal respiratory effort, no increased work of breathing. Drains: 26 French two-way Foley with light red urine, no clots in the tubing, lighter and thinner than yesterday  Laboratory Data: Reviewed Renal function improved with creatinine 2.33 from 4.19 yesterday  Urine culture from 1/29 with >100k Proteus  Assessment & Plan:   In summary, the patient is an extremely co-morbid and non-verbal 69 year old male with prolonged 3-week hospitalization secondary to hypernatremia and worsening renal function, now with new UTI, urinary retention, and bilateral upstream hydroureteronephrosis with hematuria after 42F Foley placement 01/24/20.  He has low risk prostate cancer, his PSA has been stable, and this is unlikely to be the source of his hematuria or retention.  More likely etiologies would include active UTI, Foley trauma especially in the setting of his developmental delay and pulling at lines, or decompressive hematuria.  Urine has lightened after irrigation yesterday and is significantly improved today.  Continues to drain well.  I did not irrigate the Foley this morning as he had significant spasms and discomfort with irrigation yesterday.  Recommendations:  -Continue antibiotics x2 weeks for Proteus UTI -Continue Flomax and finasteride -Hold anticoagulation -Consider Foley removal and voiding trial early this week, as he is very high risk for traumatic Foley removal or worsening hematuria from pulling on his catheter -Urology will continue to follow  If catheter not draining would recommend hand irrigation, if unable  to irrigate with elevated bladder scan would recommend replacing with a 20 [place] two-way Foley to improve drainage  Billey Co, MD

## 2020-01-26 NOTE — Consult Note (Signed)
[place] for Electrolyte Monitoring and Replacement   Recent Labs: Potassium (mmol/L)  Date Value  01/26/2020 3.5   Magnesium (mg/dL)  Date Value  01/26/2020 1.7   Calcium (mg/dL)  Date Value  01/26/2020 8.3 (L)   Albumin (g/dL)  Date Value  01/22/2020 2.6 (L)   Phosphorus (mg/dL)  Date Value  01/26/2020 3.4   Sodium (mmol/L)  Date Value  01/26/2020 137   Corrected Ca: 9.6 mg/dL  Assessment: 68 y.o.malewith medical history significant ofmental retardation, hypertension, hyperlipidemia, stroke, BPH, who presents with altered mental status. His renal function is poor but improving. He is not on HD due to conservative management in the setting of continuing to make urine.   D5 @ 75 ml/hr PEG tube placed 1/29  Goal of Therapy:  Electrolytes WNL  Plan:  K 3.5 Mag 1.7 Phos 3.4 Scr 2.33  Will order Magnesium 1 gram IV x 1 (conservative given renal fxn)  Per dietitian, pt at high re-feed risk with PEG tube planned for today 1/29, will need to watch K, Mag, Phos for a few days - labs ordered for AM    Noralee Space ,PharmD Clinical Pharmacist 01/26/2020 7:31 AM

## 2020-01-26 NOTE — Progress Notes (Signed)
Lang Lopezrodriguez  MRN: [id number]  DOB/AGE: [birth year] 69 y.o.  Primary Care Physician:Feldpausch, Chrissie Noa, MD  Admit date: 01/08/2020  Chief Complaint:  Chief Complaint  Patient presents with  . Altered Mental Status    S-Pt presented on  01/08/2020 with  Chief Complaint  Patient presents with  . Altered Mental Status  .    Pt offers no new complaints   Medications . Chlorhexidine Gluconate Cloth  6 each Topical Q0600  . finasteride  5 mg Oral Daily  . free water  120 mL Per Tube Q4H  . mouth rinse  15 mL Mouth Rinse q12n4p  . pantoprazole (PROTONIX) IV  40 mg Intravenous Q12H  . tamsulosin  0.4 mg Oral Daily         [id number] to get any data   Physical Exam: Vital signs in last 24 hours: Temp:  [97.5 F (36.4 C)-98.8 F (37.1 C)] 98.4 F (36.9 C) (01/31 0457) Pulse Rate:  [86-88] 86 (01/31 0457) Resp:  [16-18] 16 (01/31 0457) BP: (103-123)/(76-78) 120/78 (01/31 0457) SpO2:  [85 %-100 %] 100 % (01/31 0457) Weight change:  Last BM Date: 01/26/20  Intake/Output from previous day: 01/30 0701 - 01/31 0700 In: 2575.7 [I.V.:775.7; IV Piggyback:100] Out: 750 [Urine:750] No intake/output data recorded.   Physical Exam: General-chronically ill-appearing, cachectic  resp- No acute REsp distress, CTA B/L NO Rhonchi CVS- S1S2 regular in rate and rhythm GIT- BS+, soft, NT, ND, PEG tube in situ EXT- NO LE Edema, Cyanosis GU- Foley cath in situ   Lab Results: CBC Recent Labs    01/25/20 0449 01/25/20 2234  WBC 7.2 5.9  HGB 10.7* 9.9*  HCT 34.2* 30.8*  PLT 216 223    BMET Recent Labs    01/25/20 0449 01/26/20 0543  NA 137 137  K 4.6 3.5  CL 107 108  CO2 18* 18*  GLUCOSE 137* 131*  BUN 94* 73*  CREATININE 4.19* 2.33*  CALCIUM 8.7* 8.3*    Creatinine trend 2021 3.1==> 9.4==>4.1==>2.33 2020  0.9   Sodium trend 170=>137  CO2 trend 15==>18   MICRO Recent Results (from the past 240 hour(s))  Urine Culture     Status: Abnormal   Collection Time: 01/24/20  3:58 PM   Specimen: Urine, Random  Result Value Ref Range Status   Specimen Description   Final    URINE, RANDOM Performed at [facility], [address] [place], [place] [address]    Special Requests   Final    NONE Performed at [facility], [address] [place], [place] [address]    Culture >=100,000 COLONIES/mL PROTEUS MIRABILIS (A)  Final   Report Status 01/26/2020 FINAL  Final   Organism ID, Bacteria PROTEUS MIRABILIS (A)  Final      Susceptibility   Proteus mirabilis - MIC*    AMPICILLIN <=2 SENSITIVE Sensitive     CEFAZOLIN <=4 SENSITIVE Sensitive     CEFTRIAXONE <=0.25 SENSITIVE Sensitive     CIPROFLOXACIN <=0.25 SENSITIVE Sensitive     GENTAMICIN <=1 SENSITIVE Sensitive     IMIPENEM 2 SENSITIVE Sensitive     NITROFURANTOIN 128 RESISTANT Resistant     TRIMETH/SULFA <=20 SENSITIVE Sensitive     AMPICILLIN/SULBACTAM <=2 SENSITIVE Sensitive     PIP/TAZO <=4 SENSITIVE Sensitive     * >=100,000 COLONIES/mL PROTEUS MIRABILIS      Lab Results  Component Value Date   CALCIUM 8.3 (L) 01/26/2020   PHOS 3.4 01/26/2020  Impression: 1)Renal  AKI secondary to ATN AKI secondary to dehydration  There is some component of obstructive uropathy as well as patient CT scan has shown obstruction/hydronephrosis    AKI is now better Creatinine is improving Urine output is better than before    2)HTN Blood pressure at goal for the acute state   3)Anemia of chronic disease  HGb at goal (9--11) No need for Epogen  4) hypernatremia Now better  5) hypokalemia Now better  6) Acid base Co2 not at goal But better  7) Hematuria Sec to acute cystis  On IV antibiotics Primary team following    Plan:   We will continue current treatment    Rusell Meneely s Theador Hawthorne 01/26/2020, 9:33 AM

## 2020-01-26 NOTE — Progress Notes (Addendum)
[place] [facility] [place] NAME: Samuel Shelton    MR#:  [id number]  DATE OF BIRTH:  [birth year]  SUBJECTIVE:  Does not respond to VC. Looking around and tracking voice Getting IVF No fever Foley draining dark urine (cola) TF started--tolerating Ok REVIEW OF SYSTEMS:   Review of Systems  Unable to perform ROS: Medical condition  remains confused, nonverbal DRUG ALLERGIES:  No Known Allergies  VITALS:  Blood pressure 120/78, pulse 86, temperature 98.4 F (36.9 C), temperature source Oral, resp. rate 16, height 6' (1.829 m), weight 79.9 kg, SpO2 100 %.  PHYSICAL EXAMINATION:   Physical Exam-- limited exam due pt unable to participate  GENERAL:  69 y.o.-year-old patient lying in the bed with no acute distress.  LUNGS: Normal breath sounds bilaterally, no wheezing No use of accessory muscles of respiration.  CARDIOVASCULAR: S1, S2 normal.   foley catheter placed+--dark urine NEUROLOGIC: confused  Nonverbal/does not communicate  SKIN per RN--POA  Pressure Injury 01/11/20 Heel Left (Active)  01/11/20 0137  Location: Heel  Location Orientation: Left  Staging:   Wound Description (Comments):   Present on Admission:      Pressure Injury 01/22/20 Heel Right Deep Tissue Pressure Injury - Purple or maroon localized area of discolored intact skin or blood-filled blister due to damage of underlying soft tissue from pressure and/or shear. dark purple (Active)  01/22/20 1427  Location: Heel  Location Orientation: Right  Staging: Deep Tissue Pressure Injury - Purple or maroon localized area of discolored intact skin or blood-filled blister due to damage of underlying soft tissue from pressure and/or shear.  Wound Description (Comments): dark purple  Present on Admission:    LABORATORY PANEL:  CBC Recent Labs  Lab 01/25/20 2234  WBC 5.9  HGB 9.9*  HCT 30.8*  PLT 223    Chemistries  Recent Labs  Lab 01/26/20 0543  NA 137  K 3.5  CL  108  CO2 18*  GLUCOSE 131*  BUN 73*  CREATININE 2.33*  CALCIUM 8.3*  MG 1.7   Cardiac Enzymes No results for input(s): TROPONINI in the last 168 hours. RADIOLOGY:  CT ABDOMEN PELVIS WO CONTRAST  Result Date: 01/24/2020 CLINICAL DATA:  69 year old male with acute kidney injury/renal failure. New left side hydronephrosis. History of COVID-19. Last month. EXAM: CT ABDOMEN AND PELVIS WITHOUT CONTRAST TECHNIQUE: Multidetector CT imaging of the abdomen and pelvis was performed following the standard protocol without IV contrast. COMPARISON:  Renal ultrasound today and earlier this month. FINDINGS: Lower chest: No cardiomegaly or pericardial effusion. No pleural effusion. Coarse bilateral lower lobe peribronchial opacity more resembles infection than atelectasis. Negative middle lobes. Hepatobiliary: Streak artifact from the patient's arms. Negative noncontrast liver and gallbladder. Pancreas: Negative. Spleen: Negative. Adrenals/Urinary Tract: Normal adrenal glands. Mild to moderate bilateral hydronephrosis and hydroureter. Both ureters remain dilated into the pelvis, and taper at the bilateral UVJ although no obstructing calculus is identified. Urinary bladder is distended with mild bladder wall thickening and perivesical stranding. Estimated bladder volume 862 milliliters. No nephrolithiasis. No obstructing calculus identified although there may be punctate calculi layering within the bladder (series 2, image 74). Stomach/Bowel: Redundant sigmoid colon. Negative rectum. There is some fluid in the descending colon. Combination of fluid and stool in the transverse colon. Fluid in the right colon. But no convincing large bowel wall thickening. Normal appendix suspected on series 2, image 51. Motion artifact at the ileocecal valve. No dilated small bowel. Percutaneous gastrostomy tube in place with  decompressed stomach. No free air. No free fluid identified. Vascular/Lymphatic: Vascular patency is not evaluated  in the absence of IV contrast. Mild iliac artery calcified atherosclerosis. Reproductive: Within normal limits. Other: Mild presacral stranding. No pelvic free fluid. Musculoskeletal: Osteopenia. Chronic L5 pars fractures. Superimposed lumbar spine degeneration. No acute osseous abnormality identified. IMPRESSION: 1. Bilateral hydronephrosis and hydroureter appears secondary to marked bladder distension with estimated bladder volume 862 mL. No obstructing calculus identified although there may be punctate calculi layering within the bladder. Bladder wall thickening and perivesical stranding raises the possibility of superimposed bladder infection. Recommend Foley catheter decompression. 2. Coarse bilateral lower lobe peribronchial opacity more resembles infection than atelectasis and might be remnant of COVID-19 in this setting. 3. Percutaneous gastrostomy with no adverse features. No bowel obstruction. Electronically Signed   By: Genevie Ann M.D.   On: 01/24/2020 19:18   ASSESSMENT AND PLAN:  Samuel Shelton is a 69 y.o. male with medical history significant of mental retardation, hypertension, hyperlipidemia, stroke, BPH, who presents with altered mental status.  Hematuria with h/o Prostate adenocarcinoma (monitored by Dr Erlene Quan by PSA score) and acute Cystitis -pt was seen last by urology 02/2019 -pt has h/o incomplete voiding--was on flomax and finasteride -US renal (01/24/20)showed hydronephrosis with bladder debris/hemorrhage -CT abd ( 01/24/20) Bilateral hydronephrosis and hydroureter appears29 secondary to marked bladder distension with estimated bladder volume 862 mL.No obstructing calculus identified although there may be punctate calculi layering within the bladder.Bladder wall thickening and perivesical stranding raises the possibility of superimposed bladder infection. -Foley catheter placed--for acute retention-->draining dark urine with clotts - IV rocephin for possible Acute cystitis. (abx for  14 days per Urology) -UC proteus  -wbc 7.2, no fever -Urology consult with Dr Diamantina Providence appreciated. Continue Foley catheter for now till urine clears  and thereafter voiding trial before removing Foley.  Acute gastritis/PUD noted on EGD -IV PPI bid-- change to liquid PPI via peg tube  Severe Hypernatremia: resolved -Na 170 on admission   -ultrasound renal essentially nothing acute - Serum Sodium 138 today, continue intravenous D5W per nephrology  AKI with ATN secondary to severe dehydration  -Nephrology following.   urine output reasonable -creat 5.5--4.19--2.33  Chronic mental disorder and acute metabolic encephalopathy -CT head is negative for acute intracranial abnormalities, likely due to electrolytes disturbance and worsening renal function  Hypercalcemia/ Hypokalemia Resolved  Moderate to severe protein calorie nutrition -GI consult with Dr Dawson Bills placed 1/29-Dietitian consulted--started tube feeds  Recent COVID-19 virus infection: - Patient had a positive COVID-19 on 12/12/2020. -pt DOES NOT need airborne isolation per infection control  Overalllong term prognosis poor.    Family communication : [name] updated on phone 1/30 Consults : nephrology, palliative care,Psychiatry, GI, Urology  discharge Disposition : SNF with outpatient Palliative Discharge barrier:  starting PEG feeding, hematuria w/u with urine retention and need voiding trial before foley can be removed CODE STATUS:DNR DVT Prophylaxis SCD (has hematuria)  TOTAL TIME TAKING CARE OF THIS PATIENT: *25* minutes.  >50% time spent on counselling and coordination of care  Note: This dictation was prepared with Dragon dictation along with smaller phrase technology. Any transcriptional errors that result from this process are unintentional.  Fritzi Mandes M.D on 01/26/2020 at 12:59 PM  [facility]   CC: Primary care physician; Sofie Hartigan, MDPatient ID: [name], male   DOB:  [birth year], 69 y.o.   MRN: [id number]

## 2020-01-27 ENCOUNTER — Telehealth: Payer: Self-pay | Admitting: Urology

## 2020-01-27 DIAGNOSIS — E871 Hypo-osmolality and hyponatremia: Secondary | ICD-10-CM

## 2020-01-27 LAB — BASIC METABOLIC PANEL
Anion gap: 11 (ref 5–15)
BUN: 46 mg/dL — ABNORMAL HIGH (ref 8–23)
CO2: 20 mmol/L — ABNORMAL LOW (ref 22–32)
Calcium: 8.1 mg/dL — ABNORMAL LOW (ref 8.9–10.3)
Chloride: 108 mmol/L (ref 98–111)
Creatinine, Ser: 1.49 mg/dL — ABNORMAL HIGH (ref 0.61–1.24)
GFR calc Af Amer: 54 mL/min — ABNORMAL LOW (ref >60)
GFR calc non Af Amer: 47 mL/min — ABNORMAL LOW (ref >60)
Glucose, Bld: 121 mg/dL — ABNORMAL HIGH (ref 70–99)
Potassium: 3.9 mmol/L (ref 3.5–5.1)
Sodium: 139 mmol/L (ref 135–145)

## 2020-01-27 LAB — MAGNESIUM: Magnesium: 1.9 mg/dL (ref 1.7–2.4)

## 2020-01-27 LAB — PHOSPHORUS: Phosphorus: 2.8 mg/dL (ref 2.5–4.6)

## 2020-01-27 MED ORDER — JEVITY 1.5 CAL/FIBER PO LIQD
1000.0000 mL | ORAL | Status: DC
  Administered 2020-01-27: 1000 mL

## 2020-01-27 MED ORDER — CEPHALEXIN 250 MG/5ML PO SUSR
500.0000 mg | Freq: Four times a day (QID) | ORAL | Status: DC
  Administered 2020-01-27 – 2020-01-29 (×7): 500 mg
  Filled 2020-01-27 (×10): qty 10

## 2020-01-27 MED ORDER — JEVITY 1.5 CAL/FIBER PO LIQD
1000.0000 mL | ORAL | Status: DC
  Administered 2020-01-27 – 2020-01-29 (×3): 1000 mL

## 2020-01-27 NOTE — Progress Notes (Signed)
Physical Therapy Re-Evaluation Patient Details Name: Samuel Shelton MRN: [id number] DOB: [birth year] Today's Date: 01/27/2020    History of Present Illness Pt is 49 male with PMH of hypertension, hyperlipidemia, stroke 2019, BPH. A resident of [facility], of note Covid virus infection 12/ 2019 admitted on 01/08/2020 for acute kidney failure, hypernatremia, AMS. Underwent PEG tube placement 1/28.    PT Comments    Patient seen for a re-evaluation this session due to recent PEG tube placement. Patient more alert this session, able to keep eyes open and occasionally nods to PT speech. Unable to communicate his wants/needs when presented with yes or no questions. PROM performed on BUE and BLE, pt initially resistant but did allow PROM towards end of repetitions, no active attempt to aide in motion. PT has made several attempts and has seen the patient for several follow up treatment sessions to assess the patient's ability to participate with therapy. At this time the patient has not meaningfully participated with physical therapy, PT to sign off. MD notified of PT plan.      Follow Up Recommendations  No PT follow up     Equipment Recommendations  None recommended by PT    Recommendations for Other Services       Precautions / Restrictions Precautions Precautions: Fall Restrictions Weight Bearing Restrictions: No    Mobility  Bed Mobility                  Transfers                    Ambulation/Gait                 Stairs             Wheelchair Mobility    Modified Rankin (Stroke Patients Only)       Balance                                            Cognition Arousal/Alertness: Awake/alert;Lethargic Behavior During Therapy: Flat affect Overall Cognitive Status: No family/caregiver present to determine baseline cognitive functioning                                 General Comments: Pt  with improved alertness this session, occasionally nods at PT but does not speak or communicate/assist in making his needs known with yes/no options      Exercises Other Exercises Other Exercises: x10 PROM of BUE shoulder flexion and PROM BLE knee flexion. Pt does not resist, but does not attempt to aide motions.    General Comments        Pertinent Vitals/Pain Pain Assessment: Faces Faces Pain Scale: No hurt    Home Living                      Prior Function            PT Goals (current goals can now be found in the care plan section) Progress towards PT goals: Not progressing toward goals - comment    Frequency    (PT to discontinue PT orders at this time due to lack of patient participation in therapy)      PT Plan Other (comment)(PT to sign off due to patient lack of meaningful participation with  therapy)    Co-evaluation              AM-PAC PT "6 Clicks" Mobility   Outcome Measure  Help needed turning from your back to your side while in a flat bed without using bedrails?: Total Help needed moving from lying on your back to sitting on the side of a flat bed without using bedrails?: Total Help needed moving to and from a bed to a chair (including a wheelchair)?: Total Help needed standing up from a chair using your arms (e.g., wheelchair or bedside chair)?: Total Help needed to walk in hospital room?: Total Help needed climbing 3-5 steps with a railing? : Total 6 Click Score: 6    End of Session   Activity Tolerance: Other (comment)(limited by patient participation) Patient left: in bed;with call bell/phone within reach;with bed alarm set Nurse Communication: Mobility status PT Visit Diagnosis: Other abnormalities of gait and mobility (R26.89);Muscle weakness (generalized) (M62.81)     Time: [id number] PT Time Calculation (min) (ACUTE ONLY): 8 min  Charges:  $Therapeutic Exercise: 8-22 mins                     Lieutenant Diego PT,  DPT 10:58 AM,01/27/20

## 2020-01-27 NOTE — TOC Progression Note (Signed)
Transition of Care [facility]) - Progression Note    Patient Details  Name: Ruhaan Taubman MRN: [id number] Date of Birth: 1951-02-19  Transition of Care [facility]) CM/SW [place], LCSW Phone Number: 01/27/2020, 3:37 PM  Clinical Narrative: Received update from unit RNCM this morning. Patient's has a [employer] that he used while at [facility]. This plan will have to be dropped before [facility] can accept him under Medicare. CSW called patient's niece. She has spoken with the staff at [facility] but has to call again. CSW spoke with [facility] admissions coordinator who stated that if she is unable to drop the plan before discharge, they will accept him under his Medicaid.    Expected Discharge Plan and Services                                                 Social Determinants of Health (SDOH) Interventions    Readmission Risk Interventions No flowsheet data found.

## 2020-01-27 NOTE — Consult Note (Signed)
[place] for Electrolyte Monitoring and Replacement   Recent Labs: Potassium (mmol/L)  Date Value  01/27/2020 3.9   Magnesium (mg/dL)  Date Value  01/27/2020 1.9   Calcium (mg/dL)  Date Value  01/27/2020 8.1 (L)   Albumin (g/dL)  Date Value  01/22/2020 2.6 (L)   Phosphorus (mg/dL)  Date Value  01/27/2020 2.8   Sodium (mmol/L)  Date Value  01/27/2020 139   Corrected Ca: 9.6 mg/dL  Assessment: 69 y.o.malewith medical history significant ofmental retardation, hypertension, hyperlipidemia, stroke, BPH, who presents with altered mental status. His renal function is poor but improving. He is not on HD due to conservative management in the setting of continuing to make urine.   PEG tube placed 1/29  Goal of Therapy:  Electrolytes WNL  Plan:  K 3.9 Mag 1.9 Phos 2.8 Scr 2.33 > 1.49  No supplementation needed at this time  Per dietitian, pt at high re-feed risk with PEG tube 1/29, will need to watch K, Mag, Phos for a few days - labs ordered for AM    Rocky Morel ,PharmD, BCPS Clinical Pharmacist 01/27/2020 7:42 AM

## 2020-01-27 NOTE — Progress Notes (Signed)
[place] at [place] NAME: Samuel Shelton    MR#:  [id number]  DATE OF BIRTH:  09/28/1951  SUBJECTIVE:  Does not respond to VC. Looking around and tracking voice No fever Foley draining dark urine (cola) TF started--tolerating Ok REVIEW OF SYSTEMS:   Review of Systems  Unable to perform ROS: Medical condition  remains confused, nonverbal DRUG ALLERGIES:  No Known Allergies  VITALS:  Blood pressure 109/78, pulse 78, temperature 98.3 F (36.8 C), temperature source Axillary, resp. rate 18, height 6' (1.829 m), weight 79.9 kg, SpO2 98 %.  PHYSICAL EXAMINATION:   Physical Exam-- limited exam due pt unable to participate  GENERAL:  69 y.o.-year-old patient lying in the bed with no acute distress.  LUNGS: Normal breath sounds bilaterally, no wheezing No use of accessory muscles of respiration.  CARDIOVASCULAR: S1, S2 normal.   foley catheter placed+--dark urine NEUROLOGIC: confused  Nonverbal/does not communicate  SKIN per RN--POA  Pressure Injury 01/11/20 Heel Left (Active)  01/11/20 0137  Location: Heel  Location Orientation: Left  Staging:   Wound Description (Comments):   Present on Admission:      Pressure Injury 01/22/20 Heel Right Deep Tissue Pressure Injury - Purple or maroon localized area of discolored intact skin or blood-filled blister due to damage of underlying soft tissue from pressure and/or shear. dark purple (Active)  01/22/20 1427  Location: Heel  Location Orientation: Right  Staging: Deep Tissue Pressure Injury - Purple or maroon localized area of discolored intact skin or blood-filled blister due to damage of underlying soft tissue from pressure and/or shear.  Wound Description (Comments): dark purple  Present on Admission:    LABORATORY PANEL:  CBC Recent Labs  Lab 01/25/20 2234  WBC 5.9  HGB 9.9*  HCT 30.8*  PLT 223    Chemistries  Recent Labs  Lab 01/27/20 0553  NA 139  K 3.9  CL 108  CO2  20*  GLUCOSE 121*  BUN 46*  CREATININE 1.49*  CALCIUM 8.1*  MG 1.9   Cardiac Enzymes No results for input(s): TROPONINI in the last 168 hours. RADIOLOGY:  No results found. ASSESSMENT AND PLAN:  Samuel Shelton is a 69 y.o. male with medical history significant of mental retardation, hypertension, hyperlipidemia, stroke, BPH, who presents with altered mental status.  Hematuria with h/o Prostate adenocarcinoma (monitored by Dr Erlene Quan by PSA score) and acute Cystitis -pt was seen last by urology 02/2019 -pt has h/o incomplete voiding--was on flomax and finasteride -US renal (01/24/20)showed hydronephrosis with bladder debris/hemorrhage -CT abd ( 01/24/20) Bilateral hydronephrosis and hydroureter appears29 secondary to marked bladder distension with estimated bladder volume 862 mL.No obstructing calculus identified although there may be punctate calculi layering within the bladder.Bladder wall thickening and perivesical stranding raises the possibility of superimposed bladder infection. -Foley catheter placed--for acute retention-->draining dark urine with clotts - IV rocephin for possible Acute cystitis. (abx for 14 days per Urology)--changed to liq keflex -UC proteus  -wbc 7.2, no fever -Urology consult with Dr Diamantina Providence appreciated. Continue Foley catheter for now till urine clears  and thereafter voiding trial before removing Foley. -Foley irrigation orders placed  Acute gastritis/PUD noted on EGD -IV PPI bid-- change to liquid PPI via peg tube  Severe Hypernatremia: resolved -Na 170 on admission   - Serum Sodium 138 -d/c ivf  AKI with ATN secondary to severe dehydration  -Nephrology following.   urine output reasonable -creat 5.5--4.19--2.33-1.4  Chronic mental disorder and acute metabolic encephalopathy -CT  head is negative for acute intracranial abnormalities, likely due to electrolytes disturbance and worsening renal function  Hypercalcemia/  Hypokalemia Resolved  Moderate to severe protein calorie nutrition -GI consult with Dr Dawson Bills placed 1/29 -Dietitian consulted--started tube feeds  Recent COVID-19 virus infection: - Patient had a positive COVID-19 on 12/12/2020. -pt DOES NOT need airborne isolation per infection control  Overalllong term prognosis poor.    Family communication : [name] updated on phone 1/30 Consults : nephrology, palliative care,Psychiatry, GI, Urology  discharge Disposition : SNF with outpatient Palliative Discharge barrier:  Continues with hematuria  and need voiding trial before foley can be removed CODE STATUS:DNR DVT Prophylaxis SCD (has hematuria)  TOTAL TIME TAKING CARE OF THIS PATIENT: *25* minutes.  >50% time spent on counselling and coordination of care  Note: This dictation was prepared with Dragon dictation along with smaller phrase technology. Any transcriptional errors that result from this process are unintentional.  Fritzi Mandes M.D on 01/27/2020 at 3:20 PM  [facility]   CC: Primary care physician; Sofie Hartigan, MDPatient ID: Samuel Shelton, male   DOB: [birth year], 69 y.o.   MRN: [id number]

## 2020-01-27 NOTE — Progress Notes (Signed)
[facility]  ROUNDING NOTE   Subjective:   Getting PEG tube feeds.   Nods yes and no.   Creatinine 1.49 (2.33) CO2 20 (18)  UOP 2425  Objective:  Vital signs in last 24 hours:  Temp:  [98.1 F (36.7 C)-98.3 F (36.8 C)] 98.3 F (36.8 C) (02/01 1213) Pulse Rate:  [77-79] 78 (02/01 1213) Resp:  [16-18] 18 (02/01 1213) BP: (109-130)/(78-85) 109/78 (02/01 1213) SpO2:  [98 %-100 %] 98 % (02/01 1213)  Weight change:  Filed Weights   01/08/20 0950 01/11/20 0108 01/23/20 0454  Weight: 85.7 kg 79.2 kg 79.9 kg    Intake/Output: I/O last 3 completed shifts: In: 971.6 [NG/GT:957; IV Piggyback:14.6] Out: [id number] [Urine:3175]   Intake/Output this shift:  No intake/output data recorded.  Physical Exam: General: NAD,   Head: Normocephalic, atraumatic. Moist oral mucosal membranes  Eyes: Anicteric, PERRL  Neck: Supple, trachea midline  Lungs:  Clear to auscultation  Heart: Regular rate and rhythm  Abdomen:  Soft, nontender, +PEG  Extremities:  noperipheral edema.  Neurologic: Can nod yes and no  Skin: No lesions  GU: Foley catheter with gross hematuria.     Basic Metabolic Panel: Recent Labs  Lab 01/22/20 0704 01/22/20 0704 01/23/20 0741 01/23/20 0741 01/24/20 0445 01/24/20 0445 01/25/20 0449 01/26/20 0543 01/27/20 0553  NA 139   < > 138  --  137  --  137 137 139  K 4.6   < > 4.3  --  4.2  --  4.6 3.5 3.9  CL 109   < > 109  --  106  --  107 108 108  CO2 18*   < > 15*  --  17*  --  18* 18* 20*  GLUCOSE 137*   < > 117*  --  113*  --  137* 131* 121*  BUN 108*   < > 107*  --  105*  --  94* 73* 46*  CREATININE 6.23*   < > 6.31*  --  5.54*  --  4.19* 2.33* 1.49*  CALCIUM 9.0   < > 9.2   < > 8.5*   < > 8.7* 8.3* 8.1*  MG  --   --   --   --   --   --  1.8 1.7 1.9  PHOS 2.9  --   --   --   --   --  5.2* 3.4 2.8   < > = values in this interval not displayed.    Liver Function Tests: Recent Labs  Lab 01/22/20 0704  ALBUMIN 2.6*   No results for  input(s): LIPASE, AMYLASE in the last 168 hours. No results for input(s): AMMONIA in the last 168 hours.  CBC: Recent Labs  Lab 01/25/20 0449 01/25/20 2234  WBC 7.2 5.9  HGB 10.7* 9.9*  HCT 34.2* 30.8*  MCV 88.1 87.0  PLT 216 223    Cardiac Enzymes: No results for input(s): CKTOTAL, CKMB, CKMBINDEX, TROPONINI in the last 168 hours.  BNP: Invalid input(s): POCBNP  CBG: No results for input(s): GLUCAP in the last 168 hours.  Microbiology: Results for orders placed or performed during the hospital encounter of 01/08/20  MRSA PCR Screening     Status: None   Collection Time: 01/08/20  5:10 PM   Specimen: Urine, Clean Catch; Nasopharyngeal  Result Value Ref Range Status   MRSA by PCR NEGATIVE NEGATIVE Final    Comment:        The GeneXpert MRSA Assay (FDA  approved for NASAL specimens only), is one component of a comprehensive MRSA colonization surveillance program. It is not intended to diagnose MRSA infection nor to guide or monitor treatment for MRSA infections. Performed at [facility], [address] [place], [place] [address]   MRSA PCR Screening     Status: None   Collection Time: 01/11/20  2:16 AM   Specimen: Nasopharyngeal  Result Value Ref Range Status   MRSA by PCR NEGATIVE NEGATIVE Final    Comment:        The GeneXpert MRSA Assay (FDA approved for NASAL specimens only), is one component of a comprehensive MRSA colonization surveillance program. It is not intended to diagnose MRSA infection nor to guide or monitor treatment for MRSA infections. Performed at [facility], [address] [place], [place] [address]   Respiratory Panel by RT PCR (Flu A&B, Covid) - Nasopharyngeal Swab     Status: Abnormal   Collection Time: 01/11/20  2:12 PM   Specimen: Nasopharyngeal Swab  Result Value Ref Range Status   SARS Coronavirus 2 by RT PCR POSITIVE (A) NEGATIVE Final    Comment: RESULT CALLED TO, READ BACK BY AND VERIFIED  WITH: [facility] SMITH AT [id number] 01/11/20.PMF (NOTE) SARS-CoV-2 target nucleic acids are DETECTED. SARS-CoV-2 RNA is generally detectable in upper respiratory specimens  during the acute phase of infection. Positive results are indicative of the presence of the identified virus, but do not rule out bacterial infection or co-infection with other pathogens not detected by the test. Clinical correlation with patient history and other diagnostic information is necessary to determine patient infection status. The expected result is Negative. Fact Sheet for Patients:  PinkCheek.be Fact Sheet for Healthcare Providers: GravelBags.it This test is not yet approved or cleared by the [place] FDA and  has been authorized for detection and/or diagnosis of SARS-CoV-2 by FDA under an Emergency Use Authorization (EUA).  This EUA will remain in effect (meaning this test can be used) for  the duration of  the COVID-19 declaration under Section 564(b)(1) of the Act, 21 U.S.C. section 360bbb-3(b)(1), unless the authorization is terminated or revoked sooner.    Influenza A by PCR NEGATIVE NEGATIVE Final   Influenza B by PCR NEGATIVE NEGATIVE Final    Comment: (NOTE) The Xpert Xpress SARS-CoV-2/FLU/RSV assay is intended as an aid in  the diagnosis of influenza from Nasopharyngeal swab specimens and  should not be used as a sole basis for treatment. Nasal washings and  aspirates are unacceptable for Xpert Xpress SARS-CoV-2/FLU/RSV  testing. Fact Sheet for Patients: PinkCheek.be Fact Sheet for Healthcare Providers: GravelBags.it This test is not yet approved or cleared by the [place] FDA and  has been authorized for detection and/or diagnosis of SARS-CoV-2 by  FDA under an Emergency Use Authorization (EUA). This EUA will remain  in effect (meaning this test can be used) for the duration  of the  Covid-19 declaration under Section 564(b)(1) of the Act, 21  U.S.C. section 360bbb-3(b)(1), unless the authorization is  terminated or revoked. Performed at [facility], [address] [place], [place] [address]   Urine Culture     Status: Abnormal   Collection Time: 01/24/20  3:58 PM   Specimen: Urine, Random  Result Value Ref Range Status   Specimen Description   Final    URINE, RANDOM Performed at [facility], [address] [place], [place] [address]    Special Requests   Final    NONE Performed at [facility]  [facility], [place], [place] [address]    Culture >=100,000 COLONIES/mL PROTEUS MIRABILIS (A)  Final   Report Status 01/26/2020 FINAL  Final   Organism ID, Bacteria PROTEUS MIRABILIS (A)  Final      Susceptibility   Proteus mirabilis - MIC*    AMPICILLIN <=2 SENSITIVE Sensitive     CEFAZOLIN <=4 SENSITIVE Sensitive     CEFTRIAXONE <=0.25 SENSITIVE Sensitive     CIPROFLOXACIN <=0.25 SENSITIVE Sensitive     GENTAMICIN <=1 SENSITIVE Sensitive     IMIPENEM 2 SENSITIVE Sensitive     NITROFURANTOIN 128 RESISTANT Resistant     TRIMETH/SULFA <=20 SENSITIVE Sensitive     AMPICILLIN/SULBACTAM <=2 SENSITIVE Sensitive     PIP/TAZO <=4 SENSITIVE Sensitive     * >=100,000 COLONIES/mL PROTEUS MIRABILIS    Coagulation Studies: No results for input(s): LABPROT, INR in the last 72 hours.  Urinalysis: Recent Labs    01/24/20 1558  COLORURINE YELLOW*  LABSPEC 1.014  PHURINE 8.0  GLUCOSEU NEGATIVE  HGBUR MODERATE*  BILIRUBINUR NEGATIVE  KETONESUR NEGATIVE  PROTEINUR >=300*  NITRITE NEGATIVE  LEUKOCYTESUR MODERATE*      Imaging: No results found.   Medications:   . sodium chloride Stopped (01/20/20 0158)  . feeding supplement (JEVITY 1.5 CAL/FIBER) 1,000 mL (01/27/20 1249)   . cephALEXin  500 mg Per Tube Q6H  . Chlorhexidine Gluconate Cloth  6 each Topical Q0600  . finasteride  5 mg Oral Daily  . free water  120  mL Per Tube Q4H  . mouth rinse  15 mL Mouth Rinse q12n4p  . pantoprazole (PROTONIX) IV  40 mg Intravenous Q12H  . tamsulosin  0.4 mg Oral Daily   sodium chloride, acetaminophen **OR** acetaminophen, ondansetron (ZOFRAN) IV  Assessment/ Plan:  Samuel Shelton is a 69 y.o. black male SNF resident, learning disability, prostate cancer, hypertension, CVA, hyperlipidemia, who is admitted to [facility] on 01/08/2020 for acute renal failure and hypernatremia.   1. Acute renal failure with metabolic acidosis: secondary to prerenal azotemia and obstructive uropathy.  Baseline creatinine 0.95, GFR >60 on 01/31/2019 Foley catheter placed.  Renal function improved with tube feeds  2. Hypertension: well controlled.   3. Hypernatremia - free water through PEG tube  4. Urinary tract infection with gross hematuria. Proteus mirabilis on 01/24/20.  - Appreciate urology input.  - cephalexin.    LOS: [place] 2/1/20213:52 PM

## 2020-01-27 NOTE — Progress Notes (Signed)
Urology Inpatient Progress Note  Subjective: Samuel Shelton is a 69 y.o. comorbid nonverbal male first seen by urology on 01/25/2020 during a prolonged hospitalization with hyponatremia and worsening renal function and found to have new UTI, urinary retention, and bilateral upstream hydroureteronephrosis.  He developed maroon-colored gross hematuria following placement of a 16 French Foley catheter on 01/24/2020.  Creatinine down today, 1.49.  On antibiotics as below for Macrobid-resistant Proteus UTI.  Foley catheter in place today draining cherry colored urine.  Slight coagulation noted along the distal tubing, able to be cleared with tubing manipulation.  Proximal tubing with no clot material visualized.  Anti-infectives: Anti-infectives (From admission, onward)   Start     Dose/Rate Route Frequency Ordered Stop   01/24/20 1800  cefTRIAXone (ROCEPHIN) 1 g in sodium chloride 0.9 % 100 mL IVPB     1 g 200 mL/hr over 30 Minutes Intravenous Every 24 hours 01/24/20 1707     01/24/20 1300  ceFAZolin (ANCEF) IVPB 2g/100 mL premix     2 g 200 mL/hr over 30 Minutes Intravenous  Once 01/24/20 1210 01/24/20 1521      Current Facility-Administered Medications  Medication Dose Route Frequency Provider Last Rate Last Admin  . 0.9 %  sodium chloride infusion   Intravenous PRN [name], MD   Stopped at 01/20/20 0158  . acetaminophen (TYLENOL) tablet 650 mg  650 mg Oral Q6H PRN [name], MD       Or  . acetaminophen (TYLENOL) suppository 650 mg  650 mg Rectal Q6H PRN [name], MD   650 mg at 01/22/20 1405  . cefTRIAXone (ROCEPHIN) 1 g in sodium chloride 0.9 % 100 mL IVPB  1 g Intravenous Q24H Fritzi Mandes, MD 200 mL/hr at 01/26/20 1748 1 g at 01/26/20 1748  . Chlorhexidine Gluconate Cloth 2 % PADS 6 each  6 each Topical Q0600 Fritzi Mandes, MD   6 each at 01/27/20 [phone]  . feeding supplement (JEVITY 1.5 CAL/FIBER) liquid 1,000 mL  1,000 mL Per Tube Continuous Fritzi Mandes, MD 45 mL/hr at  01/26/20 1737 1,000 mL at 01/26/20 1737  . finasteride (PROSCAR) tablet 5 mg  5 mg Oral Daily Fritzi Mandes, MD   5 mg at 01/26/20 1018  . free water 120 mL  120 mL Per Tube Q4H Fritzi Mandes, MD   120 mL at 01/27/20 0752  . MEDLINE mouth rinse  15 mL Mouth Rinse q12n4p [name], MD   15 mL at 01/26/20 1741  . ondansetron (ZOFRAN) injection 4 mg  4 mg Intravenous Q8H PRN [name], MD   4 mg at 01/20/20 1457  . pantoprazole (PROTONIX) injection 40 mg  40 mg Intravenous Q12H Fritzi Mandes, MD   40 mg at 01/26/20 2136  . tamsulosin (FLOMAX) capsule 0.4 mg  0.4 mg Oral Daily Fritzi Mandes, MD   0.4 mg at 01/26/20 1018     Objective: Vital signs in last 24 hours: Temp:  [98.1 F (36.7 C)] 98.1 F (36.7 C) (02/01 0526) Pulse Rate:  [77-79] 79 (02/01 0526) Resp:  [16] 16 (02/01 0526) BP: (110-130)/(80-85) 130/85 (02/01 0526) SpO2:  [100 %] 100 % (02/01 0526)  Intake/Output from previous day: 01/31 0701 - 02/01 0700 In: 971.6 [NG/GT:957; IV Piggyback:14.6] Out: 2425 [Urine:2425] Intake/Output this shift: No intake/output data recorded.  Physical Exam Vitals and nursing note reviewed.  Constitutional:      General: He is not in acute distress.    Appearance: He is not ill-appearing, toxic-appearing or diaphoretic.  Comments: Wearing safety mitts  Skin:    General: Skin is warm and dry.  Neurological:     Mental Status: Mental status is at baseline.    Lab Results:  Recent Labs    01/25/20 0449 01/25/20 2234  WBC 7.2 5.9  HGB 10.7* 9.9*  HCT 34.2* 30.8*  PLT 216 223   BMET Recent Labs    01/26/20 0543 01/27/20 0553  NA 137 139  K 3.5 3.9  CL 108 108  CO2 18* 20*  GLUCOSE 131* 121*  BUN 73* 46*  CREATININE 2.33* 1.49*  CALCIUM 8.3* 8.1*   Assessment & Plan: 69 year old comorbid, nonverbal male who developed acute urinary retention with Proteus UTI and worsening renal function during prolonged hospitalization with hypernatremia, AKI, and AMS; he subsequently  developed gross hematuria with Foley catheter placement.  Hematuria believed to be secondary to decompression versus versus Foley trauma.  I elected to hand irrigate the patient's Foley catheter today due to continued gross hematuria.  Output rapidly cleared from cherry red to pink with no clot material visualized; I did not meet resistance with irrigation suggestive of occlusive clot material within the bladder.  Patient became agitated with irrigation and I elected to stop the procedure prematurely. I was only ultimately able to irrigate 100 mL of sterile water today, however his catheter continued to drain freely and so I am not concerned for clot retention today.  -Continue culture appropriate antibiotics for Proteus UTI, recommend 2-week course -Continue Flomax and finasteride -Hold anticoagulation -Continue Foley catheter with hand irrigation as needed; if unable to irrigate, obtain bladder scan.  If elevated, recommend upsizing to 20 [place] two-way Foley catheter to improve drainage -We will see patient again tomorrow and consider voiding trial while inpatient due to concerns for high risk of traumatic Foley removal.  Otherwise, he is scheduled for outpatient voiding trial in our clinic in 10 days.  Debroah Loop, PA-C 01/27/2020

## 2020-01-27 NOTE — Telephone Encounter (Signed)
-----   Message from Billey Co, MD sent at 01/25/2020  8:59 AM EST ----- Regarding: Follow up Please schedule follow up with dr Erlene Quan or Sam for void trial in 2 weeks, thanks  [employer]

## 2020-01-27 NOTE — Telephone Encounter (Signed)
Done

## 2020-01-27 NOTE — Progress Notes (Signed)
OT Cancellation Note  Patient Details Name: Samuel Shelton MRN: [id number] DOB: 05-Jan-1951   Cancelled Treatment:    Reason Eval/Treat Not Completed: Other (comment). Upon attempt, pt sleeping soundly. Difficult to rouse but will nod head to voice. Unable to meaningfully communicate his wants/needs when presented with yes or no questions. Unable to meaningfully participate in skilled therapy at this time. Will sign off. Please re-consult if pt's ability to participate improves and/or additional acute OT needs arise.   Jeni Salles, MPH, MS, OTR/L ascom [phone] 01/27/20, 2:42 PM

## 2020-01-28 LAB — BASIC METABOLIC PANEL
Anion gap: 8 (ref 5–15)
BUN: 33 mg/dL — ABNORMAL HIGH (ref 8–23)
CO2: 24 mmol/L (ref 22–32)
Calcium: 8.3 mg/dL — ABNORMAL LOW (ref 8.9–10.3)
Chloride: 108 mmol/L (ref 98–111)
Creatinine, Ser: 1.22 mg/dL (ref 0.61–1.24)
GFR calc Af Amer: >60 mL/min (ref >60)
GFR calc non Af Amer: 60 mL/min — ABNORMAL LOW (ref >60)
Glucose, Bld: 104 mg/dL — ABNORMAL HIGH (ref 70–99)
Potassium: 3.9 mmol/L (ref 3.5–5.1)
Sodium: 140 mmol/L (ref 135–145)

## 2020-01-28 LAB — MAGNESIUM: Magnesium: 1.7 mg/dL (ref 1.7–2.4)

## 2020-01-28 LAB — PHOSPHORUS: Phosphorus: 2.2 mg/dL — ABNORMAL LOW (ref 2.5–4.6)

## 2020-01-28 MED ORDER — PANTOPRAZOLE SODIUM 40 MG PO PACK: 40.0000 mg | PACK | Freq: Every day | ORAL | 0 refills | Status: DC

## 2020-01-28 MED ORDER — PANTOPRAZOLE SODIUM 40 MG PO PACK
40.0000 mg | PACK | Freq: Every day | ORAL | Status: DC
  Administered 2020-01-28 – 2020-01-29 (×2): 40 mg
  Filled 2020-01-28 (×2): qty 20

## 2020-01-28 MED ORDER — JEVITY 1.5 CAL/FIBER PO LIQD: 1000.0000 mL | ORAL | 0 refills | Status: DC

## 2020-01-28 MED ORDER — FREE WATER: 120.0000 mL | 0 refills | Status: DC

## 2020-01-28 MED ORDER — CEPHALEXIN 250 MG/5ML PO SUSR: 500.0000 mg | Freq: Four times a day (QID) | ORAL | 0 refills | Status: AC

## 2020-01-28 MED ORDER — POTASSIUM PHOSPHATE MONOBASIC 500 MG PO TABS
1000.0000 mg | ORAL_TABLET | Freq: Three times a day (TID) | ORAL | Status: AC
  Administered 2020-01-28 (×3): 1000 mg via ORAL
  Filled 2020-01-28 (×3): qty 2

## 2020-01-28 MED ORDER — MAGNESIUM SULFATE 2 GM/50ML IV SOLN
2.0000 g | Freq: Once | INTRAVENOUS | Status: AC
  Administered 2020-01-28: 2 g via INTRAVENOUS
  Filled 2020-01-28: qty 50

## 2020-01-28 NOTE — Consult Note (Signed)
[facility] for Electrolyte Monitoring and Replacement   Recent Labs: Potassium (mmol/L)  Date Value  01/28/2020 3.9   Magnesium (mg/dL)  Date Value  01/28/2020 1.7   Calcium (mg/dL)  Date Value  01/28/2020 8.3 (L)   Albumin (g/dL)  Date Value  01/22/2020 2.6 (L)   Phosphorus (mg/dL)  Date Value  01/28/2020 2.2 (L)   Sodium (mmol/L)  Date Value  01/28/2020 140   Corrected Ca: 9.6 mg/dL  Assessment: 69 y.o.malewith medical history significant ofmental retardation, hypertension, hyperlipidemia, stroke, BPH, who presents with altered mental status. His renal function is poor but improving. He is not on HD due to conservative management in the setting of continuing to make urine.   PEG tube placed 1/29  Goal of Therapy:  Electrolytes WNL  Plan:  K 3.9 Mag 1.7 Phos 2.2 Scr 2.33 > 1.22  Will order KPhos 1g PO x 3 doses, as well as Magnesium Sulfate 2g IV x 1 dose as Magnesium is on the lower end of desired range and has dropped since yesterday.  Per dietitian, pt at high re-feed risk with PEG tube 1/29, will need to watch K, Mag, Phos for a few days - labs ordered for AM    Pearla Dubonnet, PharmD Clinical Pharmacist 01/28/2020 7:30 AM

## 2020-01-28 NOTE — Progress Notes (Signed)
[facility]  ROUNDING NOTE   Subjective:   Foley with pinkish urine  Nods yes and no.   Creatinine 1.22 (1.49) (2.33)    Objective:  Vital signs in last 24 hours:  Temp:  [97.6 F (36.4 C)-98.8 F (37.1 C)] 98.8 F (37.1 C) (02/02 0554) Pulse Rate:  [78-83] 83 (02/02 0554) Resp:  [18] 18 (02/02 0554) BP: (109-125)/(78-93) 125/81 (02/02 0554) SpO2:  [98 %-100 %] 100 % (02/02 0554)  Weight change:  Filed Weights   01/08/20 0950 01/11/20 0108 01/23/20 0454  Weight: 85.7 kg 79.2 kg 79.9 kg    Intake/Output: I/O last 3 completed shifts: In: 1122 [Other:220; NG/GT:902] Out: 2575 [Urine:2575]   Intake/Output this shift:  No intake/output data recorded.  Physical Exam: General: NAD,   Head: Normocephalic, atraumatic. Moist oral mucosal membranes  Eyes: Anicteric, PERRL  Neck: Supple, trachea midline  Lungs:  Clear to auscultation  Heart: Regular rate and rhythm  Abdomen:  Soft, nontender, +PEG  Extremities:  noperipheral edema.  Neurologic: Can nod yes and no  Skin: No lesions  GU: Foley catheter with gross hematuria.     Basic Metabolic Panel: Recent Labs  Lab 01/22/20 0704 01/23/20 0741 01/24/20 0445 01/24/20 0445 01/25/20 0449 01/25/20 0449 01/26/20 0543 01/27/20 0553 01/28/20 0503  NA 139   < > 137  --  137  --  137 139 140  K 4.6   < > 4.2  --  4.6  --  3.5 3.9 3.9  CL 109   < > 106  --  107  --  108 108 108  CO2 18*   < > 17*  --  18*  --  18* 20* 24  GLUCOSE 137*   < > 113*  --  137*  --  131* 121* 104*  BUN 108*   < > 105*  --  94*  --  73* 46* 33*  CREATININE 6.23*   < > 5.54*  --  4.19*  --  2.33* 1.49* 1.22  CALCIUM 9.0   < > 8.5*   < > 8.7*   < > 8.3* 8.1* 8.3*  MG  --   --   --   --  1.8  --  1.7 1.9 1.7  PHOS 2.9  --   --   --  5.2*  --  3.4 2.8 2.2*   < > = values in this interval not displayed.    Liver Function Tests: Recent Labs  Lab 01/22/20 0704  ALBUMIN 2.6*   No results for input(s): LIPASE, AMYLASE in the  last 168 hours. No results for input(s): AMMONIA in the last 168 hours.  CBC: Recent Labs  Lab 01/25/20 0449 01/25/20 2234  WBC 7.2 5.9  HGB 10.7* 9.9*  HCT 34.2* 30.8*  MCV 88.1 87.0  PLT 216 223    Cardiac Enzymes: No results for input(s): CKTOTAL, CKMB, CKMBINDEX, TROPONINI in the last 168 hours.  BNP: Invalid input(s): POCBNP  CBG: No results for input(s): GLUCAP in the last 168 hours.  Microbiology: Results for orders placed or performed during the hospital encounter of 01/08/20  MRSA PCR Screening     Status: None   Collection Time: 01/08/20  5:10 PM   Specimen: Urine, Clean Catch; Nasopharyngeal  Result Value Ref Range Status   MRSA by PCR NEGATIVE NEGATIVE Final    Comment:        The GeneXpert MRSA Assay (FDA approved for NASAL specimens only), is one component of  a comprehensive MRSA colonization surveillance program. It is not intended to diagnose MRSA infection nor to guide or monitor treatment for MRSA infections. Performed at [facility], [address] [place], [place] [address]   MRSA PCR Screening     Status: None   Collection Time: 01/11/20  2:16 AM   Specimen: Nasopharyngeal  Result Value Ref Range Status   MRSA by PCR NEGATIVE NEGATIVE Final    Comment:        The GeneXpert MRSA Assay (FDA approved for NASAL specimens only), is one component of a comprehensive MRSA colonization surveillance program. It is not intended to diagnose MRSA infection nor to guide or monitor treatment for MRSA infections. Performed at [facility], [address] [place], [place] [address]   Respiratory Panel by RT PCR (Flu A&B, Covid) - Nasopharyngeal Swab     Status: Abnormal   Collection Time: 01/11/20  2:12 PM   Specimen: Nasopharyngeal Swab  Result Value Ref Range Status   SARS Coronavirus 2 by RT PCR POSITIVE (A) NEGATIVE Final    Comment: RESULT CALLED TO, READ BACK BY AND VERIFIED WITH: [facility] SMITH AT [id number]  01/11/20.PMF (NOTE) SARS-CoV-2 target nucleic acids are DETECTED. SARS-CoV-2 RNA is generally detectable in upper respiratory specimens  during the acute phase of infection. Positive results are indicative of the presence of the identified virus, but do not rule out bacterial infection or co-infection with other pathogens not detected by the test. Clinical correlation with patient history and other diagnostic information is necessary to determine patient infection status. The expected result is Negative. Fact Sheet for Patients:  PinkCheek.be Fact Sheet for Healthcare Providers: GravelBags.it This test is not yet approved or cleared by the [place] FDA and  has been authorized for detection and/or diagnosis of SARS-CoV-2 by FDA under an Emergency Use Authorization (EUA).  This EUA will remain in effect (meaning this test can be used) for  the duration of  the COVID-19 declaration under Section 564(b)(1) of the Act, 21 U.S.C. section 360bbb-3(b)(1), unless the authorization is terminated or revoked sooner.    Influenza A by PCR NEGATIVE NEGATIVE Final   Influenza B by PCR NEGATIVE NEGATIVE Final    Comment: (NOTE) The Xpert Xpress SARS-CoV-2/FLU/RSV assay is intended as an aid in  the diagnosis of influenza from Nasopharyngeal swab specimens and  should not be used as a sole basis for treatment. Nasal washings and  aspirates are unacceptable for Xpert Xpress SARS-CoV-2/FLU/RSV  testing. Fact Sheet for Patients: PinkCheek.be Fact Sheet for Healthcare Providers: GravelBags.it This test is not yet approved or cleared by the [place] FDA and  has been authorized for detection and/or diagnosis of SARS-CoV-2 by  FDA under an Emergency Use Authorization (EUA). This EUA will remain  in effect (meaning this test can be used) for the duration of the  Covid-19  declaration under Section 564(b)(1) of the Act, 21  U.S.C. section 360bbb-3(b)(1), unless the authorization is  terminated or revoked. Performed at [facility], [address], [place], [place] [address]   Urine Culture     Status: Abnormal   Collection Time: 01/24/20  3:58 PM   Specimen: Urine, Random  Result Value Ref Range Status   Specimen Description   Final    URINE, RANDOM Performed at [facility], [address], [name], [place] [address]    Special Requests   Final    NONE Performed at [facility], [address] [place], [place] [address]  Culture >=100,000 COLONIES/mL PROTEUS MIRABILIS (A)  Final   Report Status 01/26/2020 FINAL  Final   Organism ID, Bacteria PROTEUS MIRABILIS (A)  Final      Susceptibility   Proteus mirabilis - MIC*    AMPICILLIN <=2 SENSITIVE Sensitive     CEFAZOLIN <=4 SENSITIVE Sensitive     CEFTRIAXONE <=0.25 SENSITIVE Sensitive     CIPROFLOXACIN <=0.25 SENSITIVE Sensitive     GENTAMICIN <=1 SENSITIVE Sensitive     IMIPENEM 2 SENSITIVE Sensitive     NITROFURANTOIN 128 RESISTANT Resistant     TRIMETH/SULFA <=20 SENSITIVE Sensitive     AMPICILLIN/SULBACTAM <=2 SENSITIVE Sensitive     PIP/TAZO <=4 SENSITIVE Sensitive     * >=100,000 COLONIES/mL PROTEUS MIRABILIS    Coagulation Studies: No results for input(s): LABPROT, INR in the last 72 hours.  Urinalysis: No results for input(s): COLORURINE, LABSPEC, PHURINE, GLUCOSEU, HGBUR, BILIRUBINUR, KETONESUR, PROTEINUR, UROBILINOGEN, NITRITE, LEUKOCYTESUR in the last 72 hours.  Invalid input(s): APPERANCEUR    Imaging: No results found.   Medications:   . sodium chloride Stopped (01/20/20 0158)  . feeding supplement (JEVITY 1.5 CAL/FIBER) 1,000 mL (01/28/20 0543)  . magnesium sulfate bolus IVPB     . cephALEXin  500 mg Per Tube Q6H  . Chlorhexidine Gluconate Cloth  6 each Topical Q0600  . finasteride  5 mg Oral Daily  . free water  120 mL Per Tube Q4H   . mouth rinse  15 mL Mouth Rinse q12n4p  . pantoprazole (PROTONIX) IV  40 mg Intravenous Q12H  . potassium phosphate (monobasic)  1,000 mg Oral TID WC  . tamsulosin  0.4 mg Oral Daily   sodium chloride, acetaminophen **OR** acetaminophen, ondansetron (ZOFRAN) IV  Assessment/ Plan:  [name] is a 69 y.o. black male SNF resident, learning disability, prostate cancer, hypertension, CVA, hyperlipidemia, who is admitted to [facility] on 01/08/2020 for acute renal failure and hypernatremia.   1. Acute renal failure with metabolic acidosis: secondary to prerenal azotemia and obstructive uropathy.  Baseline creatinine 0.95, GFR >60 on 01/31/2019 Foley catheter with pinkish urine.  Renal function improving with tube feeds  2. Hypertension: well controlled.   3. Hypernatremia - free water through PEG tube  4. Urinary tract infection with gross hematuria. Proteus mirabilis on 01/24/20.  - Appreciate urology input.  - cephalexin.    LOS: [place] 2/2/20219:35 AM

## 2020-01-28 NOTE — Discharge Summary (Addendum)
Samuel Shelton at [facility] Shelton NAME: Samuel Shelton    MR#:  [id number]  DATE OF BIRTH:  1951/04/24  DATE OF ADMISSION:  01/08/2020 ADMITTING PHYSICIAN: Samuel Costa, MD  DATE OF DISCHARGE:   PRIMARY CARE PHYSICIAN: Samuel Hartigan, MD    ADMISSION DIAGNOSIS:  Acute kidney failure, unspecified ([place]) [N17.9] Hypernatremia [E87.0] Altered mental status, unspecified altered mental status type [R41.82]  DISCHARGE DIAGNOSIS:  severe hypernatremia with acute renal failure/ATN due to poor PO intake chronic mental disorder with metabolic encephalopathy-- nonverbal at baseline hypercalcemia, hypokalemia moderate to severe protein calorie malnutrition hematuria due to Proteus UTI  history of prostate adenocarcinoma Pressure decubitus --POA SECONDARY DIAGNOSIS:   Past Medical History:  Diagnosis Date  . Chronic mental illness   . Edema of both legs   . Elevated PSA   . Elevated PSA   . Hypertension   . Mental retardation   . Phimosis     HOSPITAL COURSE:  Samuel Shelton a 69 y.o.malewith medical history significant ofmental retardation, hypertension, hyperlipidemia, stroke, BPH, who presents with altered mental status.  Hematuria with h/oBPH, Prostate adenocarcinoma (monitored by Samuel Shelton by PSA score) and acute Cystitis -pt was seen last by urology 02/2019 -pt has h/o incomplete voiding with PVR around 200 ml -was on flomax and finasteride -US renal (01/08/20) unremarkable -US renal (01/24/20)showed hydronephrosis with bladder debris/hemorrhage -CT abd ( 01/24/20) Bilateral hydronephrosis and hydroureter appears secondary to marked bladder distension.No obstructing calculus identified although there may be punctate calculi layering within the bladder.Bladder wall thickening and perivesical stranding raises the possibility of superimposed bladder infection. -Foley catheter was  placed--for acute retention-->draining dark urine with clots--now  pink tinge urine--foley removed on 01/28/20 by urology after irrigation. -per UROLOGY--bladder scan today and if PVR >250 cc then place foley back. Given h/o BPH - was on IV rocephin for Acute cystitis (abx for 14 days per Urology)--changed to liq keflex -UC proteus  -wbc 7.2, no fever -01/29/20 urine retention, patient was unable to urinate so urology was consulted and Foley catheter was inserted today.  Recommended to keep Foley and follow-up as an outpatient in 2 weeks.  Acute gastritis/PUD noted on EGD -IV PPI bid-- change to liquid PPI via peg tube  Severe Hypernatremia:resolved -Na 170 on admission - Serum Sodium 138 -d/c ivf--getting free water thru PEF  AKI with ATN secondary to severe dehydration  -Nephrology following.   urine output reasonable -creat 5.5--4.19--2.33-1.4-- 1.2  Chronic mental disorderand acute metabolic encephalopathy--now at baseline -CT head is negative for acute intracranial abnormalities, likely due to electrolytes disturbance and worsening renal function  Hypercalcemia/Hypokalemia Resolved  Moderate to severe protein calorie nutrition -GI consult with Samuel Shelton placed 1/29 -Dietitian consulted--tolerating tube feeds with free water flushes  RecentCOVID-19 virus infection: -Patient had a positive COVID-19 on 12/12/2020. -pt DOES NOT need airborne isolation per infection control  Overalllong term prognosis poor.    Palliative care to follow at discharge  Family communication : Samuel Shelton updated  Consults : nephrology, palliative care,Psychiatry, GI, Urology  discharge Disposition : SNF with outpatient Palliative Discharge barrier:  None CODE STATUS:DNR DVT Prophylaxis SCD (has hematuria)   CONSULTS OBTAINED:  Treatment Team:  Samuel Co, MD  DRUG ALLERGIES:  No Known Allergies  DISCHARGE MEDICATIONS:   Allergies as of 01/28/2020   No Known Allergies     Medication List    STOP taking these medications    aspirin 81 MG chewable tablet   atorvastatin 40  MG tablet Commonly known as: LIPITOR   hydrochlorothiazide 12.5 MG tablet Commonly known as: HYDRODIURIL   Melatonin 3 MG Tabs     TAKE these medications   acetaminophen 500 MG tablet Commonly known as: TYLENOL Take 500 mg by mouth every 4 (four) hours as needed for mild pain.   cephALEXin 250 MG/5ML suspension Commonly known as: KEFLEX Place 10 mLs (500 mg total) into feeding tube every 6 (six) hours for 41 doses.   feeding supplement (JEVITY 1.5 CAL/FIBER) Liqd Place 1,000 mLs into feeding tube continuous.   finasteride 5 MG tablet Commonly known as: PROSCAR Take 1 tablet (5 mg total) by mouth daily.   free water Soln Place 120 mLs into feeding tube every 4 (four) hours.   pantoprazole sodium 40 mg/20 mL Pack Commonly known as: PROTONIX Place 20 mLs (40 mg total) into feeding tube daily.   tamsulosin 0.4 MG Caps capsule Commonly known as: Flomax Take 1 capsule (0.4 mg total) by mouth daily.       If you experience worsening of your admission symptoms, develop shortness of breath, life threatening emergency, suicidal or homicidal thoughts you must seek medical attention immediately by calling 911 or calling your MD immediately  if symptoms less severe.  You Must read complete instructions/literature along with all the possible adverse reactions/side effects for all the Medicines you take and that have been prescribed to you. Take any new Medicines after you have completely understood and accept all the possible adverse reactions/side effects.   Please note  You were cared for by a hospitalist during your hospital stay. If you have any questions about your discharge medications or the care you received while you were in the hospital after you are discharged, you can call the unit and asked to speak with the hospitalist on call if the hospitalist that took care of you is not available. Once you are discharged, your  primary care physician will handle any further medical issues. Please note that NO REFILLS for any discharge medications will be authorized once you are discharged, as it is imperative that you return to your primary care physician (or establish a relationship with a primary care physician if you do not have one) for your aftercare needs so that they can reassess your need for medications and monitor your lab values.  DATA REVIEW:   CBC  Recent Labs  Lab 01/25/20 2234  WBC 5.9  HGB 9.9*  HCT 30.8*  PLT 223    Chemistries  Recent Labs  Lab 01/28/20 0503  NA 140  K 3.9  CL 108  CO2 24  GLUCOSE 104*  BUN 33*  CREATININE 1.22  CALCIUM 8.3*  MG 1.7    Microbiology Results   Recent Results (from the past 240 hour(s))  Urine Culture     Status: Abnormal   Collection Time: 01/24/20  3:58 PM   Specimen: Urine, Random  Result Value Ref Range Status   Specimen Description   Final    URINE, RANDOM Performed at [facility], [address] [place], [place] [address]    Special Requests   Final    NONE Performed at [facility], [address] [place], [place] [address]    Culture >=100,000 COLONIES/mL PROTEUS MIRABILIS (A)  Final   Report Status 01/26/2020 FINAL  Final   Organism ID, Bacteria PROTEUS MIRABILIS (A)  Final      Susceptibility   Proteus mirabilis - MIC*    AMPICILLIN <=2 SENSITIVE Sensitive  CEFAZOLIN <=4 SENSITIVE Sensitive     CEFTRIAXONE <=0.25 SENSITIVE Sensitive     CIPROFLOXACIN <=0.25 SENSITIVE Sensitive     GENTAMICIN <=1 SENSITIVE Sensitive     IMIPENEM 2 SENSITIVE Sensitive     NITROFURANTOIN 128 RESISTANT Resistant     TRIMETH/SULFA <=20 SENSITIVE Sensitive     AMPICILLIN/SULBACTAM <=2 SENSITIVE Sensitive     PIP/TAZO <=4 SENSITIVE Sensitive     * >=100,000 COLONIES/mL PROTEUS MIRABILIS    RADIOLOGY:  No results found.   CODE STATUS:     Code Status Orders  (From admission, onward)         Start     Ordered    01/11/20 1520  Do not attempt resuscitation (DNR)  Continuous    Question Answer Comment  In the event of cardiac or respiratory ARREST Do not call a "code blue"   In the event of cardiac or respiratory ARREST Do not perform Intubation, CPR, defibrillation or ACLS   In the event of cardiac or respiratory ARREST Use medication by any route, position, wound care, and other measures to relive pain and suffering. May use oxygen, suction and manual treatment of airway obstruction as needed for comfort.   Comments d/w niece and HCPOA ms morris      01/11/20 1520        Code Status History    Date Active Date Inactive Code Status Order ID Comments User Context   01/10/2020 1628 01/11/2020 1520 Full Code [id number]  Fritzi Mandes, MD Inpatient   01/10/2020 1038 01/10/2020 1628 DNR [id number]  Fritzi Mandes, MD Inpatient   01/08/2020 1649 01/10/2020 1038 Full Code [id number]  Samuel Costa, MD Inpatient   01/28/2019 0027 01/31/2019 2231 Full Code [id number]  Aroor, Lanice Schwab, MD ED   Advance Care Planning Activity       TOTAL TIME TAKING CARE OF THIS PATIENT: *35* minutes.    Fritzi Mandes M.D  [facility]    CC: Primary care physician; Samuel Hartigan, MD

## 2020-01-28 NOTE — Progress Notes (Signed)
Samuel Shelton NAME: Samuel Shelton    MR#:  [id number]  DATE OF BIRTH:  12-07-1951  SUBJECTIVE:   Looking around and tracking voice, nonverbal No fever Foley draining pink tinge urine today TF started--tolerating Ok REVIEW OF SYSTEMS:   Review of Systems  Unable to perform ROS: Medical condition  remains confused, nonverbal DRUG ALLERGIES:  No Known Allergies  VITALS:  Blood pressure 105/81, pulse 81, temperature 98.5 F (36.9 C), temperature source Oral, resp. rate 18, height 6' (1.829 m), weight 78.5 kg, SpO2 99 %.  PHYSICAL EXAMINATION:   Physical Exam-- limited exam due pt unable to participate  GENERAL:  69 y.o.-year-old patient lying in the bed with no acute distress.  LUNGS: Normal breath sounds bilaterally, no wheezing No use of accessory muscles of respiration.  CARDIOVASCULAR: S1, S2 normal.   foley catheter placed+- light pink urine, no clots NEUROLOGIC:  Nonverbal/does not communicate  SKIN per RN--POA  Pressure Injury 01/11/20 Heel Left (Active)  01/11/20 0137  Location: Heel  Location Orientation: Left  Staging:   Wound Description (Comments):   Present on Admission:      Pressure Injury 01/22/20 Heel Right Deep Tissue Pressure Injury - Purple or maroon localized area of discolored intact skin or blood-filled blister due to damage of underlying soft tissue from pressure and/or shear. dark purple (Active)  01/22/20 1427  Location: Heel  Location Orientation: Right  Staging: Deep Tissue Pressure Injury - Purple or maroon localized area of discolored intact skin or blood-filled blister due to damage of underlying soft tissue from pressure and/or shear.  Wound Description (Comments): dark purple  Present on Admission:    LABORATORY PANEL:  CBC Recent Labs  Lab 01/25/20 2234  WBC 5.9  HGB 9.9*  HCT 30.8*  PLT 223    Chemistries  Recent Labs  Lab 01/28/20 0503  NA 140  K 3.9  CL 108  CO2 24   GLUCOSE 104*  BUN 33*  CREATININE 1.22  CALCIUM 8.3*  MG 1.7   Cardiac Enzymes No results for input(s): TROPONINI in the last 168 hours. RADIOLOGY:  No results found. ASSESSMENT AND PLAN:  Samuel Shelton is a 69 y.o. male with medical history significant of mental retardation, hypertension, hyperlipidemia, stroke, BPH, who presents with altered mental status.  Hematuria with h/o Prostate adenocarcinoma (monitored by Dr Erlene Quan by PSA score) and acute Cystitis -pt was seen last by urology 02/2019 -pt has h/o incomplete voiding with PVR around 200 ml -was on flomax and finasteride -US renal (01/24/20)showed hydronephrosis with bladder debris/hemorrhage -CT abd ( 01/24/20) Bilateral hydronephrosis and hydroureter appears secondary to marked bladder distension with estimated bladder volume 862 mL.No obstructing calculus identified although there may be punctate calculi layering within the bladder.Bladder wall thickening and perivesical stranding raises the possibility of superimposed bladder infection. -Foley catheter placed--for acute retention-->draining dark urine with clots--now pink tinge urine--foley removed this am by urology after irrigation. -per UROLOGY--bladder scan today and PVR >250 cc then place foley back. - IV rocephin for Acute cystitis (abx for 14 days per Urology)--changed to liq keflex -UC proteus  -wbc 7.2, no fever  Acute gastritis/PUD noted on EGD -IV PPI bid-- change to liquid PPI via peg tube  Severe Hypernatremia: resolved -Na 170 on admission   - Serum Sodium 138 -d/c ivf  AKI with ATN secondary to severe dehydration  -Nephrology following.   urine output reasonable -creat 5.5--4.19--2.33-1.4-- 1.2  Chronic mental disorder and acute  metabolic encephalopathy--now at baseline -CT head is negative for acute intracranial abnormalities, likely due to electrolytes disturbance and worsening renal function  Hypercalcemia/ Hypokalemia Resolved  Moderate to  severe protein calorie nutrition -GI consult with Dr Dawson Bills placed 1/29 -Dietitian consulted--tolerating tube feeds with free water flushes  Recent COVID-19 virus infection: - Patient had a positive COVID-19 on 12/12/2020. -pt DOES NOT need airborne isolation per infection control  Overalllong term prognosis poor.    Pt will d/c to rehab tomorrow if remains stable. Niece updated in the room today and aware. CSW notified of the d/c plan  Family communication : [name] moore updated in the room Consults : nephrology, palliative care,Psychiatry, GI, Urology  discharge Disposition : SNF with outpatient Palliative Discharge barrier:  None CODE STATUS:DNR DVT Prophylaxis SCD (has hematuria)  TOTAL TIME TAKING CARE OF THIS PATIENT: *25* minutes.  >50% time spent on counselling and coordination of care  Note: This dictation was prepared with Dragon dictation along with smaller phrase technology. Any transcriptional errors that result from this process are unintentional.  Fritzi Mandes M.D on 01/28/2020 at 1:52 PM  [facility]   CC: Primary care physician; Sofie Hartigan, MDPatient ID: Samuel Shelton, male   DOB: 10-24-1951, 69 y.o.   MRN: [id number]

## 2020-01-28 NOTE — Care Management Important Message (Signed)
Important Message  Patient Details  Name: Samuel Shelton MRN: [id number] Date of Birth: [birth year]   Medicare Important Message Given:  Yes     Dannette Barbara 01/28/2020, 11:24 AM

## 2020-01-28 NOTE — Progress Notes (Signed)
Nutrition Follow Up Note   DOCUMENTATION CODES:   Not applicable  INTERVENTION:   Jevity 1.5 @65ml /hr  Free water flushes 123ml q4 hours   Regimen provides 2340kcal/day, 100g/day protein, 32g/day fiber, 1943ml/day free water   Pt at high refeed risk; recommend monitor K, Mg and P labs daily until stable  NUTRITION DIAGNOSIS:   Inadequate oral intake related to lethargy/confusion(acute metabolic encephalopathy; chronic mental illness) as evidenced by other (comment)(pt with poor oral intake since admit, requiring PEG tube).  GOAL:   Patient will meet greater than or equal to 90% of their needs  -progressing with tube feeds  MONITOR:   Labs, Weight trends, TF tolerance, Skin, I & O's  ASSESSMENT:    69 year old male with past medical history of chronic mental illness, mental retardation, chronic bilateral LE edema, HTN, HLD, BPH, nonverbal s/p stroke in 2019, and positive Covid 19 test on 12/18. Patient presented from SNF with worsening mental status, decreased oral intake and decreased responsiveness from baseline.  Pt tolerating tube feeds well at goal rate. Phosphorus slightly low today; pharmacy following for electrolyte management. No new weight since 1/28; will request weekly weights.  Medications reviewed and include: cephalexin, protonix, KPhos  Labs reviewed: K 3.9 wnl, BUN 33(H) P 2.2(L), Mg 1.7 wnl  Diet Order:   Diet Order            Diet NPO time specified  Diet effective now             EDUCATION NEEDS:   Not appropriate for education at this time  Skin:  Skin Assessment: Reviewed RN Assessment(PI;left heel)  Last BM:  2/2- type 7  Height:   Ht Readings from Last 1 Encounters:  01/11/20 6' (1.829 m)    Weight:   Wt Readings from Last 1 Encounters:  01/23/20 79.9 kg    Ideal Body Weight:  80.9 kg  BMI:  Body mass index is 23.9 kg/m.  Estimated Nutritional Needs:   Kcal:  2100-2400kcal/day  Protein:  105-120g/day  Fluid:   >2L/day  Koleen Distance MS, RD, LDN Pager #- [phone] Office#- [phone] After Hours Pager: [phone]

## 2020-01-28 NOTE — TOC Progression Note (Addendum)
Transition of Care [facility]) - Progression Note    Patient Details  Name: Samuel Shelton MRN: [id number] Date of Birth: 1951/07/07  Transition of Care [facility]) CM/SW [place], LCSW Phone Number: 01/28/2020, 12:22 PM  Clinical Narrative: Patient will discharge to [facility] tomorrow if still stable. MD has made patient's niece aware. SNF admissions coordinator is aware and agreeable. He will not need a new COVID test. Called EMS to confirm they can take him there. They will check on tomorrow's schedule and call CSW back.   1:22 pm: EMS can transport patient to [facility] tomorrow as long as he is picked up by 9:30. Confirmed with SNF that this is okay. MD is aware and will relay to whoever has him tomorrow. RN is also aware and will be here tomorrow. Went ahead and got him on the schedule for tomorrow at 9:30. CSW called and spoke to [place] at [employer]. If we do not get him ready in time, they will be able to pick him up.  Expected Discharge Plan and Services                                                 Social Determinants of Health (SDOH) Interventions    Readmission Risk Interventions No flowsheet data found.

## 2020-01-28 NOTE — Progress Notes (Signed)
Urology Inpatient Progress Note  Subjective: Samuel Shelton is a 69 y.o. comorbid nonverbal male admitted on 01/08/2020 with hyponatremia and worsening renal function and subsequently found to have a new UTI, urinary retention, and bilateral upstream hydroureteronephrosis.  He developed maroon-colored gross hematuria following placement of 16 French Foley catheter on 01/24/2020.   Creatinine continues to downtrend today, 1.22.  Foley catheter in place draining clear yellow urine with nonoccluding bloody sediment visualized in his drainage tubing.  Anti-infectives: Anti-infectives (From admission, onward)   Start     Dose/Rate Route Frequency Ordered Stop   01/27/20 1800  cephALEXin (KEFLEX) 250 MG/5ML suspension 500 mg     500 mg Per Tube Every 6 hours 01/27/20 1437 02/06/20 2359   01/24/20 1800  cefTRIAXone (ROCEPHIN) 1 g in sodium chloride 0.9 % 100 mL IVPB  Status:  Discontinued     1 g 200 mL/hr over 30 Minutes Intravenous Every 24 hours 01/24/20 1707 01/27/20 1437   01/24/20 1300  ceFAZolin (ANCEF) IVPB 2g/100 mL premix     2 g 200 mL/hr over 30 Minutes Intravenous  Once 01/24/20 1210 01/24/20 1521      Current Facility-Administered Medications  Medication Dose Route Frequency Provider Last Rate Last Admin  . 0.9 %  sodium chloride infusion   Intravenous PRN [name], MD 50 mL/hr at 01/28/20 0949 30 mL at 01/28/20 0949  . acetaminophen (TYLENOL) tablet 650 mg  650 mg Oral Q6H PRN [name], MD       Or  . acetaminophen (TYLENOL) suppository 650 mg  650 mg Rectal Q6H PRN [name], MD   650 mg at 01/22/20 1405  . cephALEXin (KEFLEX) 250 MG/5ML suspension 500 mg  500 mg Per Tube Q6H Fritzi Mandes, MD   500 mg at 01/28/20 [id number]  . Chlorhexidine Gluconate Cloth 2 % PADS 6 each  6 each Topical Q0600 Fritzi Mandes, MD   6 each at 01/28/20 [phone]  . feeding supplement (JEVITY 1.5 CAL/FIBER) liquid 1,000 mL  1,000 mL Per Tube Continuous Fritzi Mandes, MD 65 mL/hr at 01/28/20 0543 1,000 mL  at 01/28/20 0543  . finasteride (PROSCAR) tablet 5 mg  5 mg Oral Daily Fritzi Mandes, MD   5 mg at 01/28/20 0936  . free water 120 mL  120 mL Per Tube Q4H Fritzi Mandes, MD   120 mL at 01/28/20 0952  . MEDLINE mouth rinse  15 mL Mouth Rinse q12n4p [name], MD   15 mL at 01/26/20 1741  . ondansetron (ZOFRAN) injection 4 mg  4 mg Intravenous Q8H PRN [name], MD   4 mg at 01/20/20 1457  . pantoprazole (PROTONIX) injection 40 mg  40 mg Intravenous Q12H Fritzi Mandes, MD   40 mg at 01/28/20 0944  . potassium phosphate (monobasic) (K-PHOS ORIGINAL) tablet 1,000 mg  1,000 mg Oral TID WC Nazari, Walid A, RPH   1,000 mg at 01/28/20 0936  . tamsulosin (FLOMAX) capsule 0.4 mg  0.4 mg Oral Daily Fritzi Mandes, MD   0.4 mg at 01/28/20 0936   Objective: Vital signs in last 24 hours: Temp:  [97.6 F (36.4 C)-98.8 F (37.1 C)] 98.8 F (37.1 C) (02/02 0554) Pulse Rate:  [78-83] 83 (02/02 0554) Resp:  [18] 18 (02/02 0554) BP: (109-125)/(78-93) 125/81 (02/02 0554) SpO2:  [98 %-100 %] 100 % (02/02 0554)  Intake/Output from previous day: 02/01 0701 - 02/02 0700 In: 1122 [NG/GT:902] Out: 1475 [Urine:1475] Intake/Output this shift: No intake/output data recorded.  Physical Exam Vitals and  nursing note reviewed.  Constitutional:      General: He is sleeping. He is not in acute distress.    Appearance: He is not ill-appearing, toxic-appearing or diaphoretic.  HENT:     Head: Normocephalic and atraumatic.  Pulmonary:     Effort: Pulmonary effort is normal. No respiratory distress.  Skin:    General: Skin is warm and dry.  Neurological:     Mental Status: Mental status is at baseline.    Lab Results:  Recent Labs    01/25/20 2234  WBC 5.9  HGB 9.9*  HCT 30.8*  PLT 223   BMET Recent Labs    01/27/20 0553 01/28/20 0503  NA 139 140  K 3.9 3.9  CL 108 108  CO2 20* 24  GLUCOSE 121* 104*  BUN 46* 33*  CREATININE 1.49* 1.22  CALCIUM 8.1* 8.3*   Assessment & Plan: 69 year old  comorbid/nonverbal male who developed acute urinary retention with Proteus UTI and worsening renal function during prolonged hospitalization with hypernatremia, AKI, and AMS; he subsequently developed gross hematuria with Foley catheter placement.  Hematuria significantly improved today.  I elected to hand irrigate his Foley catheter this morning with 120 mL of sterile water.  Output was clear with no clot material visualized.  Again, I did not meet resistance with irrigation suggestive of occlusive clot material within the bladder.  Based on these findings, I elected to remove his Foley catheter today to initiate a voiding trial.  I drained 9 mL of water from his Foley balloon and removed a 16 French Foley catheter in its entirety.  Patient tolerated well.  Of note, patient is known to have an elevated PVR around 200 mL at baseline.  -Bladder scan this afternoon/evening with consideration of Foley catheter replacement if bladder volume is >250 mL -Continue culture appropriate antibiotics for Proteus UTI x2 weeks -Continue Flomax and finasteride -OK to restart anticoagulation per primary team  Debroah Loop, PA-C 01/28/2020

## 2020-01-28 NOTE — Progress Notes (Addendum)
Patient ID: Randyn Feland, male   DOB: Oct 22, 1951, 69 y.o.   MRN: [id number]  Bladder scan in the after noon showed 292 cc. D/w Dr Sninsky--recommends continue monitor for now, cont with external catheter. Pt given Chronic MR/Dementia is at a high risk for pulling foley and causing traumatic hematuria.  If bladder scan shows >600 cc inform urology. Pt has chronic BPH and low grade prostate adenoca  Please follow up  Urology prior to pt being discharged.

## 2020-01-29 DIAGNOSIS — E870 Hyperosmolality and hypernatremia: Principal | ICD-10-CM

## 2020-01-29 LAB — BASIC METABOLIC PANEL
Anion gap: 10 (ref 5–15)
BUN: 33 mg/dL — ABNORMAL HIGH (ref 8–23)
CO2: 24 mmol/L (ref 22–32)
Calcium: 8.7 mg/dL — ABNORMAL LOW (ref 8.9–10.3)
Chloride: 108 mmol/L (ref 98–111)
Creatinine, Ser: 1.43 mg/dL — ABNORMAL HIGH (ref 0.61–1.24)
GFR calc Af Amer: 57 mL/min — ABNORMAL LOW (ref >60)
GFR calc non Af Amer: 49 mL/min — ABNORMAL LOW (ref >60)
Glucose, Bld: 108 mg/dL — ABNORMAL HIGH (ref 70–99)
Potassium: 4.1 mmol/L (ref 3.5–5.1)
Sodium: 142 mmol/L (ref 135–145)

## 2020-01-29 LAB — MAGNESIUM: Magnesium: 2 mg/dL (ref 1.7–2.4)

## 2020-01-29 LAB — PHOSPHORUS: Phosphorus: 2.6 mg/dL (ref 2.5–4.6)

## 2020-01-29 LAB — GLUCOSE, CAPILLARY: Glucose-Capillary: 121 mg/dL — ABNORMAL HIGH (ref 70–99)

## 2020-01-29 NOTE — Progress Notes (Signed)
Bladder scanned patient and got 684ml. Per day nurse to notify MD if bladder scan is over 631ml. Paged on call, Dr. Bernardo Heater who gave verbal orders to do coude in and out catheterization. Attempted to in and out cath patient, did not meet any resistance during insertion, however did not get any returned output. Patient has external catheter placed and has 73ml output since placed after in and out cath attempt. Bladder scanned patient again after attempt and showed 736ml. MD notified. Will continue to monitor.

## 2020-01-29 NOTE — Consult Note (Signed)
[place] for Electrolyte Monitoring and Replacement   Recent Labs: Potassium (mmol/L)  Date Value  01/29/2020 4.1   Magnesium (mg/dL)  Date Value  01/29/2020 2.0   Calcium (mg/dL)  Date Value  01/29/2020 8.7 (L)   Albumin (g/dL)  Date Value  01/22/2020 2.6 (L)   Phosphorus (mg/dL)  Date Value  01/29/2020 2.6   Sodium (mmol/L)  Date Value  01/29/2020 142   Corrected Ca: 9.6 mg/dL  Assessment: 69 y.o.malewith medical history significant ofmental retardation, hypertension, hyperlipidemia, stroke, BPH, who presents with altered mental status. His renal function is poor but improving. He is not on HD due to conservative management in the setting of continuing to make urine.   PEG tube placed 1/29  Goal of Therapy:  Electrolytes WNL  Plan:  K 4.1 Mag 2.0 Phos 2.6 Scr 2.33 > 1.43  Will defer further supplementation currently.  Per dietitian, pt at high re-feed risk with PEG tube 1/29, will need to watch K, Mag, Phos for a few days - labs ordered for AM    Pearla Dubonnet, PharmD Clinical Pharmacist 01/29/2020 8:54 AM

## 2020-01-29 NOTE — Progress Notes (Signed)
Urology Consult Follow Up  Subjective: Samuel Shelton was bladder scanned during the night with a volume of 620 mL.  Nursing staff attempted straight cath early this am, but they were unsuccessful.  Patient is non verbal.    Serum creatinine with slight bump up to 1.43 from 1.22 yesterday likely due to urinary retention.     Anti-infectives: Anti-infectives (From admission, onward)   Start     Dose/Rate Route Frequency Ordered Stop   01/28/20 0000  cephALEXin (KEFLEX) 250 MG/5ML suspension     500 mg Per Tube Every 6 hours 01/28/20 1400 02/08/20 2359   01/27/20 1800  cephALEXin (KEFLEX) 250 MG/5ML suspension 500 mg     500 mg Per Tube Every 6 hours 01/27/20 1437 02/06/20 2359   01/24/20 1800  cefTRIAXone (ROCEPHIN) 1 g in sodium chloride 0.9 % 100 mL IVPB  Status:  Discontinued     1 g 200 mL/hr over 30 Minutes Intravenous Every 24 hours 01/24/20 1707 01/27/20 1437   01/24/20 1300  ceFAZolin (ANCEF) IVPB 2g/100 mL premix     2 g 200 mL/hr over 30 Minutes Intravenous  Once 01/24/20 1210 01/24/20 1521      Current Facility-Administered Medications  Medication Dose Route Frequency Provider Last Rate Last Admin  . 0.9 %  sodium chloride infusion   Intravenous PRN [name], MD 50 mL/hr at 01/28/20 0949 30 mL at 01/28/20 0949  . acetaminophen (TYLENOL) tablet 650 mg  650 mg Oral Q6H PRN [name], MD       Or  . acetaminophen (TYLENOL) suppository 650 mg  650 mg Rectal Q6H PRN [name], MD   650 mg at 01/22/20 1405  . cephALEXin (KEFLEX) 250 MG/5ML suspension 500 mg  500 mg Per Tube Q6H Fritzi Mandes, MD   500 mg at 01/29/20 0521  . Chlorhexidine Gluconate Cloth 2 % PADS 6 each  6 each Topical Q0600 Fritzi Mandes, MD   6 each at 01/28/20 [id number]  . feeding supplement (JEVITY 1.5 CAL/FIBER) liquid 1,000 mL  1,000 mL Per Tube Continuous Fritzi Mandes, MD 65 mL/hr at 01/29/20 0521 1,000 mL at 01/29/20 0521  . finasteride (PROSCAR) tablet 5 mg  5 mg Oral Daily Fritzi Mandes, MD   5 mg at  01/28/20 0936  . free water 120 mL  120 mL Per Tube Q4H Fritzi Mandes, MD   120 mL at 01/29/20 0423  . MEDLINE mouth rinse  15 mL Mouth Rinse q12n4p [name], MD   15 mL at 01/26/20 1741  . ondansetron (ZOFRAN) injection 4 mg  4 mg Intravenous Q8H PRN [name], MD   4 mg at 01/20/20 1457  . pantoprazole sodium (PROTONIX) 40 mg/20 mL oral suspension 40 mg  40 mg Per Tube Daily Fritzi Mandes, MD   40 mg at 01/28/20 1511  . tamsulosin (FLOMAX) capsule 0.4 mg  0.4 mg Oral Daily Fritzi Mandes, MD   0.4 mg at 01/28/20 [id number]     Objective: Vital signs in last 24 hours: Temp:  [98.5 F (36.9 C)-100 F (37.8 C)] 99.7 F (37.6 C) (02/03 0800) Pulse Rate:  [81-113] 104 (02/03 0800) Resp:  [18] 18 (02/03 0800) BP: (105-145)/(81-99) 113/96 (02/03 0800) SpO2:  [99 %-100 %] 100 % (02/03 0800) Weight:  [78.5 kg] 78.5 kg (02/02 1247)  Intake/Output from previous day: 02/02 0701 - 02/03 0700 In: 1152 [NG/GT:1152] Out: -  Intake/Output this shift: No intake/output data recorded.   Physical Exam Constitutional:  No acute distress.  Non-verbal.   HEENT:  AT, moist mucus membranes.  Trachea midline, no masses. Cardiovascular: No cyanosis, or edema.  Hands in mitts.   Respiratory: Normal respiratory effort, no increased work of breathing. GI: Abdomen is soft, non tender, non distended, no abdominal masses. Liver and spleen not palpable.  Bladder palpable.   GU: No CVA tenderness.  Patient with uncircumcised phallus.  Foreskin with adhesions, but able to draw back pass the urethra.  Urethral meatus is patent.  No penile discharge. No penile lesions or rashes. Scrotum without lesions, cysts, rashes and/or edema.   Neurologic: Moving all 4 extremities.  Right arm contracted.   Psychiatric: Mental status at baseline.   Lab Results:  No results for input(s): WBC, HGB, HCT, PLT in the last 72 hours. BMET Recent Labs    01/28/20 0503 01/29/20 0642  NA 140 142  K 3.9 4.1  CL 108 108  CO2 24 24   GLUCOSE 104* 108*  BUN 33* 33*  CREATININE 1.22 1.43*  CALCIUM 8.3* 8.7*   PT/INR No results for input(s): LABPROT, INR in the last 72 hours. ABG No results for input(s): PHART, HCO3 in the last 72 hours.  Invalid input(s): PCO2, PO2  Studies/Results: No results found.  With the help of the nursing team, patient was cleaned and prepped in a sterile fashion with betadine. A 18 FR Coude foley catheter was inserted, urine return was noted  500 mL urine was yellow clear in color.  The balloon was filled with 10cc of sterile water.  An overnight bag was attached for drainage.  Patient tolerated well, no complications were noted   Assessment and Plan Samuel Shelton is an extreme co-mobid and non- verbal 69 year old male who was hospitalized secondary to hypernatremia and worsening renal function and was found to have a new urinary tract infection associated with urinary retention and bilateral upstream hydroureteronephrosis and hematuria who had a Foley placed and hand irrigated until urine was clear.    His Foley catheter was removed yesterday and unfortunately was not able to void on his own.  Bladder scan performed by nursing staff noted PVR greater than 600 and a straight cath was attempted with no return of urine.  With the help of nursing staff I was able to replace a 19 [place] coud with good urine return.  -Patient to be discharged with Foley catheter in place, he is at high risk for traumatic Foley removal so precautions should be in place at new facility  -Plan will be to be seen in clinic with Dr. Erlene Quan in 2 weeks for another attempt at a voiding trial -Continue Flomax and finasteride -Continue culture appropriate antibiotics for the Proteus UTI for 2 weeks    LOS: 21 days    [facility] 01/29/2020

## 2020-01-29 NOTE — Progress Notes (Signed)
Samuel Shelton to be D/C'd to [facility] with EMS per MD order.  Discussed prescriptions and follow up appointments with the patient. Prescriptions given to patient, medication list explained in detail. Pt verbalized understanding.  Allergies as of 01/29/2020   No Known Allergies      Medication List     STOP taking these medications    aspirin 81 MG chewable tablet   atorvastatin 40 MG tablet Commonly known as: LIPITOR   hydrochlorothiazide 12.5 MG tablet Commonly known as: HYDRODIURIL   Melatonin 3 MG Tabs       TAKE these medications    acetaminophen 500 MG tablet Commonly known as: TYLENOL Take 500 mg by mouth every 4 (four) hours as needed for mild pain.   cephALEXin 250 MG/5ML suspension Commonly known as: KEFLEX Place 10 mLs (500 mg total) into feeding tube every 6 (six) hours for 41 doses.   feeding supplement (JEVITY 1.5 CAL/FIBER) Liqd Place 1,000 mLs into feeding tube continuous.   finasteride 5 MG tablet Commonly known as: PROSCAR Take 1 tablet (5 mg total) by mouth daily.   free water Soln Place 120 mLs into feeding tube every 4 (four) hours.   pantoprazole sodium 40 mg/20 mL Pack Commonly known as: PROTONIX Place 20 mLs (40 mg total) into feeding tube daily.   tamsulosin 0.4 MG Caps capsule Commonly known as: Flomax Take 1 capsule (0.4 mg total) by mouth daily.        Vitals:   01/29/20 0636 01/29/20 0800  BP: (!) 145/99 (!) 113/96  Pulse: (!) 113 (!) 104  Resp:  18  Temp: 100 F (37.8 C) 99.7 F (37.6 C)  SpO2: 100% 100%    Skin clean, dry and intact without evidence of skin break down, no evidence of skin tears noted. IV catheter discontinued intact. Site without signs and symptoms of complications. Dressing and pressure applied. Pt denies pain at this time. No complaints noted.  An After Visit Summary was printed and given to the patient. Patient escorted via [place], and D/C home via private auto.  Samuel Shelton  Samuel Shelton

## 2020-01-29 NOTE — TOC Progression Note (Signed)
Transition of Care [facility]) - Progression Note    Patient Details  Name: Samuel Shelton MRN: [id number] Date of Birth: 1951-01-08  Transition [facility] Care [facility]) CM/SW [place], LCSW Phone Number: 01/29/2020, 9:37 AM  Clinical Narrative: CSW called to notify patient's niece that patient is still stable for discharge to [facility] [place]. MD made her aware of this yesterday but niece said that never happened. NP will call niece to notify her that patient has been seen this morning and is stable for discharge. Cancelled EMS but can use the other transport service later today if needed. SNF admissions coordinator is aware.    Expected Discharge Plan and Services           Expected Discharge Date: 01/29/20                                     Social Determinants of Health (SDOH) Interventions    Readmission Risk Interventions No flowsheet data found.

## 2020-01-29 NOTE — TOC Transition Note (Signed)
Transition of Care [facility]) - CM/SW Discharge Note   Patient Details  Name: [name] MRN: [id number] Date of Birth: [birth year]  Transition of Care [facility]) CM/SW Contact:  Candie Chroman, LCSW Phone Number: 01/29/2020, 10:13 AM   Clinical Narrative: Patient has orders to discharge to [facility] today. After speaking with NP, niece is agreeable. RN will call report to [phone] (Room 106). EMS has been set up and he is next on the list. No further concerns. CSW signing off.    Final next level of care: [place] Barriers to Discharge: Barriers Resolved   Patient Goals and CMS Choice     Choice offered to / list presented to : [place] / Guardian  Discharge Placement   Existing PASRR number confirmed : 01/17/20          Patient chooses bed at: Other - please specify in the comment section below:([facility]) Patient to be transferred to facility by: EMS Name of family member notified: [name] Patient and family notified of of transfer: 01/29/20  Discharge Plan and Services                                     Social Determinants of Health (SDOH) Interventions     Readmission Risk Interventions No flowsheet data found.

## 2020-01-29 NOTE — Progress Notes (Signed)
[facility]  ROUNDING NOTE   Subjective:   Foley had to be placed again.   Discharging to SNF in [place]    Objective:  Vital signs in last 24 hours:  Temp:  [99.3 F (37.4 C)-100 F (37.8 C)] 99.7 F (37.6 C) (02/03 0800) Pulse Rate:  [88-113] 104 (02/03 0800) Resp:  [18] 18 (02/03 0800) BP: (113-145)/(89-99) 113/96 (02/03 0800) SpO2:  [100 %] 100 % (02/03 0800)  Weight change:  Filed Weights   01/11/20 0108 01/23/20 0454 01/28/20 1247  Weight: 79.2 kg 79.9 kg 78.5 kg    Intake/Output: I/O last 3 completed shifts: In: 2274 [Other:220; NG/GT:2054] Out: [id number] [Urine:1475]   Intake/Output this shift:  Total I/O In: -  Out: 750 [Urine:750]  Physical Exam: General: NAD,   Head: Normocephalic, atraumatic. Moist oral mucosal membranes  Eyes: Anicteric, PERRL  Neck: Supple, trachea midline  Lungs:  Clear to auscultation  Heart: Regular rate and rhythm  Abdomen:  Soft, nontender, +PEG  Extremities:  noperipheral edema.  Neurologic: Can nod yes and no  Skin: No lesions  GU: Foley catheter with gross hematuria.     Basic Metabolic Panel: Recent Labs  Lab 01/25/20 0449 01/25/20 0449 01/26/20 0543 01/26/20 0543 01/27/20 0553 01/28/20 0503 01/29/20 0642  NA 137  --  137  --  139 140 142  K 4.6  --  3.5  --  3.9 3.9 4.1  CL 107  --  108  --  108 108 108  CO2 18*  --  18*  --  20* 24 24  GLUCOSE 137*  --  131*  --  121* 104* 108*  BUN 94*  --  73*  --  46* 33* 33*  CREATININE 4.19*  --  2.33*  --  1.49* 1.22 1.43*  CALCIUM 8.7*   < > 8.3*   < > 8.1* 8.3* 8.7*  MG 1.8  --  1.7  --  1.9 1.7 2.0  PHOS 5.2*  --  3.4  --  2.8 2.2* 2.6   < > = values in this interval not displayed.    Liver Function Tests: No results for input(s): AST, ALT, ALKPHOS, BILITOT, PROT, ALBUMIN in the last 168 hours. No results for input(s): LIPASE, AMYLASE in the last 168 hours. No results for input(s): AMMONIA in the last 168 hours.  CBC: Recent Labs  Lab  01/25/20 0449 01/25/20 2234  WBC 7.2 5.9  HGB 10.7* 9.9*  HCT 34.2* 30.8*  MCV 88.1 87.0  PLT 216 223    Cardiac Enzymes: No results for input(s): CKTOTAL, CKMB, CKMBINDEX, TROPONINI in the last 168 hours.  BNP: Invalid input(s): POCBNP  CBG: Recent Labs  Lab 01/29/20 0752  GLUCAP 121*    Microbiology: Results for orders placed or performed during the hospital encounter of 01/08/20  MRSA PCR Screening     Status: None   Collection Time: 01/08/20  5:10 PM   Specimen: Urine, Clean Catch; Nasopharyngeal  Result Value Ref Range Status   MRSA by PCR NEGATIVE NEGATIVE Final    Comment:        The GeneXpert MRSA Assay (FDA approved for NASAL specimens only), is one component of a comprehensive MRSA colonization surveillance program. It is not intended to diagnose MRSA infection nor to guide or monitor treatment for MRSA infections. Performed at [facility], [address] [place], [place] [address]   MRSA PCR Screening     Status: None   Collection Time: 01/11/20  2:16 AM   Specimen: Nasopharyngeal  Result Value Ref Range Status   MRSA by PCR NEGATIVE NEGATIVE Final    Comment:        The GeneXpert MRSA Assay (FDA approved for NASAL specimens only), is one component of a comprehensive MRSA colonization surveillance program. It is not intended to diagnose MRSA infection nor to guide or monitor treatment for MRSA infections. Performed at [facility], [address] [place], [place] [address]   Respiratory Panel by RT PCR (Flu A&B, Covid) - Nasopharyngeal Swab     Status: Abnormal   Collection Time: 01/11/20  2:12 PM   Specimen: Nasopharyngeal Swab  Result Value Ref Range Status   SARS Coronavirus 2 by RT PCR POSITIVE (A) NEGATIVE Final    Comment: RESULT CALLED TO, READ BACK BY AND VERIFIED WITH: [facility] SMITH AT [id number] 01/11/20.PMF (NOTE) SARS-CoV-2 target nucleic acids are DETECTED. SARS-CoV-2 RNA is generally detectable in upper  respiratory specimens  during the acute phase of infection. Positive results are indicative of the presence of the identified virus, but do not rule out bacterial infection or co-infection with other pathogens not detected by the test. Clinical correlation with patient history and other diagnostic information is necessary to determine patient infection status. The expected result is Negative. Fact Sheet for Patients:  PinkCheek.be Fact Sheet for Healthcare Providers: GravelBags.it This test is not yet approved or cleared by the [place] FDA and  has been authorized for detection and/or diagnosis of SARS-CoV-2 by FDA under an Emergency Use Authorization (EUA).  This EUA will remain in effect (meaning this test can be used) for  the duration of  the COVID-19 declaration under Section 564(b)(1) of the Act, 21 U.S.C. section 360bbb-3(b)(1), unless the authorization is terminated or revoked sooner.    Influenza A by PCR NEGATIVE NEGATIVE Final   Influenza B by PCR NEGATIVE NEGATIVE Final    Comment: (NOTE) The Xpert Xpress SARS-CoV-2/FLU/RSV assay is intended as an aid in  the diagnosis of influenza from Nasopharyngeal swab specimens and  should not be used as a sole basis for treatment. Nasal washings and  aspirates are unacceptable for Xpert Xpress SARS-CoV-2/FLU/RSV  testing. Fact Sheet for Patients: PinkCheek.be Fact Sheet for Healthcare Providers: GravelBags.it This test is not yet approved or cleared by the [place] FDA and  has been authorized for detection and/or diagnosis of SARS-CoV-2 by  FDA under an Emergency Use Authorization (EUA). This EUA will remain  in effect (meaning this test can be used) for the duration of the  Covid-19 declaration under Section 564(b)(1) of the Act, 21  U.S.C. section 360bbb-3(b)(1), unless the authorization is  terminated  or revoked. Performed at [facility], [address] [place], [place] [address]   Urine Culture     Status: Abnormal   Collection Time: 01/24/20  3:58 PM   Specimen: Urine, Random  Result Value Ref Range Status   Specimen Description   Final    URINE, RANDOM Performed at [facility], [address] [place], [place] [address]    Special Requests   Final    NONE Performed at [facility], [address] [place],  [address]    Culture >=100,000 COLONIES/mL PROTEUS MIRABILIS (A)  Final   Report Status 01/26/2020 FINAL  Final   Organism ID, Bacteria PROTEUS MIRABILIS (A)  Final      Susceptibility   Proteus mirabilis - MIC*    AMPICILLIN <=2 SENSITIVE Sensitive  CEFAZOLIN <=4 SENSITIVE Sensitive     CEFTRIAXONE <=0.25 SENSITIVE Sensitive     CIPROFLOXACIN <=0.25 SENSITIVE Sensitive     GENTAMICIN <=1 SENSITIVE Sensitive     IMIPENEM 2 SENSITIVE Sensitive     NITROFURANTOIN 128 RESISTANT Resistant     TRIMETH/SULFA <=20 SENSITIVE Sensitive     AMPICILLIN/SULBACTAM <=2 SENSITIVE Sensitive     PIP/TAZO <=4 SENSITIVE Sensitive     * >=100,000 COLONIES/mL PROTEUS MIRABILIS    Coagulation Studies: No results for input(s): LABPROT, INR in the last 72 hours.  Urinalysis: No results for input(s): COLORURINE, LABSPEC, PHURINE, GLUCOSEU, HGBUR, BILIRUBINUR, KETONESUR, PROTEINUR, UROBILINOGEN, NITRITE, LEUKOCYTESUR in the last 72 hours.  Invalid input(s): APPERANCEUR    Imaging: No results found.   Medications:   . sodium chloride 30 mL (01/28/20 0949)  . feeding supplement (JEVITY 1.5 CAL/FIBER) 1,000 mL (01/29/20 0521)   . cephALEXin  500 mg Per Tube Q6H  . Chlorhexidine Gluconate Cloth  6 each Topical Q0600  . finasteride  5 mg Oral Daily  . free water  120 mL Per Tube Q4H  . mouth rinse  15 mL Mouth Rinse q12n4p  . pantoprazole sodium  40 mg Per Tube Daily  . tamsulosin  0.4 mg Oral Daily   sodium chloride, acetaminophen  **OR** acetaminophen, ondansetron (ZOFRAN) IV  Assessment/ Plan:  Mr. Kainin Hosp is a 69 y.o. black male SNF resident, learning disability, prostate cancer, hypertension, CVA, hyperlipidemia, who is admitted to [facility] on 01/08/2020 for acute renal failure and hypernatremia.   1. Acute renal failure with metabolic acidosis: secondary to prerenal azotemia and obstructive uropathy.  Baseline creatinine 0.95, GFR >60 on 01/31/2019 Foley catheter with pinkish urine.  Renal function improved with tube feeds  2. Hypertension: well controlled.  - tamsulosin  3. Hypernatremia - free water through PEG tube  4. Urinary tract infection with gross hematuria. Proteus mirabilis on 01/24/20.  - Appreciate urology input.  - cephalexin.    LOS: [employer] 2/3/20213:50 PM

## 2020-02-06 ENCOUNTER — Ambulatory Visit: Payer: Medicare (Managed Care) | Admitting: Physician Assistant

## 2020-08-03 ENCOUNTER — Emergency Department (HOSPITAL_COMMUNITY): Payer: Medicare Other

## 2020-08-03 ENCOUNTER — Encounter (HOSPITAL_COMMUNITY): Payer: Self-pay | Admitting: Emergency Medicine

## 2020-08-03 ENCOUNTER — Inpatient Hospital Stay (HOSPITAL_COMMUNITY)
Admission: EM | Admit: 2020-08-03 | Discharge: 2020-08-07 | DRG: 071 | Disposition: A | Discharge status: Skilled Nursing Facility | Payer: Medicare Other | Attending: Internal Medicine | Admitting: Internal Medicine

## 2020-08-03 ENCOUNTER — Other Ambulatory Visit: Payer: Self-pay

## 2020-08-03 ENCOUNTER — Observation Stay (HOSPITAL_COMMUNITY): Payer: Medicare Other

## 2020-08-03 DIAGNOSIS — Z833 Family history of diabetes mellitus: Secondary | ICD-10-CM

## 2020-08-03 DIAGNOSIS — Z993 Dependence on wheelchair: Secondary | ICD-10-CM

## 2020-08-03 DIAGNOSIS — R4182 Altered mental status, unspecified: Secondary | ICD-10-CM | POA: Diagnosis not present

## 2020-08-03 DIAGNOSIS — R569 Unspecified convulsions: Secondary | ICD-10-CM | POA: Diagnosis not present

## 2020-08-03 DIAGNOSIS — Z8673 Personal history of transient ischemic attack (TIA), and cerebral infarction without residual deficits: Secondary | ICD-10-CM

## 2020-08-03 DIAGNOSIS — Z20822 Contact with and (suspected) exposure to covid-19: Secondary | ICD-10-CM | POA: Diagnosis present

## 2020-08-03 DIAGNOSIS — N1831 Chronic kidney disease, stage 3a: Secondary | ICD-10-CM | POA: Diagnosis not present

## 2020-08-03 DIAGNOSIS — Z823 Family history of stroke: Secondary | ICD-10-CM

## 2020-08-03 DIAGNOSIS — R471 Dysarthria and anarthria: Secondary | ICD-10-CM | POA: Diagnosis present

## 2020-08-03 DIAGNOSIS — E870 Hyperosmolality and hypernatremia: Secondary | ICD-10-CM | POA: Diagnosis present

## 2020-08-03 DIAGNOSIS — Z87891 Personal history of nicotine dependence: Secondary | ICD-10-CM

## 2020-08-03 DIAGNOSIS — G9340 Encephalopathy, unspecified: Secondary | ICD-10-CM | POA: Diagnosis not present

## 2020-08-03 DIAGNOSIS — I1310 Hypertensive heart and chronic kidney disease without heart failure, with stage 1 through stage 4 chronic kidney disease, or unspecified chronic kidney disease: Secondary | ICD-10-CM | POA: Diagnosis present

## 2020-08-03 DIAGNOSIS — R531 Weakness: Secondary | ICD-10-CM

## 2020-08-03 DIAGNOSIS — Z79899 Other long term (current) drug therapy: Secondary | ICD-10-CM

## 2020-08-03 DIAGNOSIS — R1310 Dysphagia, unspecified: Secondary | ICD-10-CM | POA: Diagnosis present

## 2020-08-03 DIAGNOSIS — N400 Enlarged prostate without lower urinary tract symptoms: Secondary | ICD-10-CM | POA: Diagnosis present

## 2020-08-03 DIAGNOSIS — N390 Urinary tract infection, site not specified: Secondary | ICD-10-CM | POA: Diagnosis present

## 2020-08-03 DIAGNOSIS — E785 Hyperlipidemia, unspecified: Secondary | ICD-10-CM | POA: Diagnosis present

## 2020-08-03 DIAGNOSIS — F79 Unspecified intellectual disabilities: Secondary | ICD-10-CM | POA: Diagnosis not present

## 2020-08-03 DIAGNOSIS — Z931 Gastrostomy status: Secondary | ICD-10-CM

## 2020-08-03 DIAGNOSIS — N183 Chronic kidney disease, stage 3 unspecified: Secondary | ICD-10-CM | POA: Diagnosis present

## 2020-08-03 DIAGNOSIS — Z8616 Personal history of COVID-19: Secondary | ICD-10-CM

## 2020-08-03 DIAGNOSIS — G9341 Metabolic encephalopathy: Secondary | ICD-10-CM | POA: Diagnosis not present

## 2020-08-03 DIAGNOSIS — R29810 Facial weakness: Secondary | ICD-10-CM | POA: Diagnosis present

## 2020-08-03 DIAGNOSIS — R4702 Dysphasia: Secondary | ICD-10-CM | POA: Diagnosis present

## 2020-08-03 DIAGNOSIS — E119 Type 2 diabetes mellitus without complications: Secondary | ICD-10-CM | POA: Diagnosis present

## 2020-08-03 DIAGNOSIS — Z8249 Family history of ischemic heart disease and other diseases of the circulatory system: Secondary | ICD-10-CM

## 2020-08-03 DIAGNOSIS — Z8546 Personal history of malignant neoplasm of prostate: Secondary | ICD-10-CM

## 2020-08-03 DIAGNOSIS — I6529 Occlusion and stenosis of unspecified carotid artery: Secondary | ICD-10-CM | POA: Diagnosis present

## 2020-08-03 DIAGNOSIS — E860 Dehydration: Secondary | ICD-10-CM | POA: Diagnosis present

## 2020-08-03 LAB — COMPREHENSIVE METABOLIC PANEL
ALT: 40 U/L (ref 0–44)
AST: 62 U/L — ABNORMAL HIGH (ref 15–41)
Albumin: 2.9 g/dL — ABNORMAL LOW (ref 3.5–5.0)
Alkaline Phosphatase: 50 U/L (ref 38–126)
Anion gap: 12 (ref 5–15)
BUN: 34 mg/dL — ABNORMAL HIGH (ref 8–23)
CO2: 24 mmol/L (ref 22–32)
Calcium: 9.1 mg/dL (ref 8.9–10.3)
Chloride: 108 mmol/L (ref 98–111)
Creatinine, Ser: 1.51 mg/dL — ABNORMAL HIGH (ref 0.61–1.24)
GFR calc Af Amer: 53 mL/min — ABNORMAL LOW (ref >60)
GFR calc non Af Amer: 46 mL/min — ABNORMAL LOW (ref >60)
Glucose, Bld: 102 mg/dL — ABNORMAL HIGH (ref 70–99)
Potassium: 3.8 mmol/L (ref 3.5–5.1)
Sodium: 144 mmol/L (ref 135–145)
Total Bilirubin: 0.8 mg/dL (ref 0.3–1.2)
Total Protein: 7.3 g/dL (ref 6.5–8.1)

## 2020-08-03 LAB — I-STAT CHEM 8, ED
BUN: 33 mg/dL — ABNORMAL HIGH (ref 8–23)
Calcium, Ion: 1.18 mmol/L (ref 1.15–1.40)
Chloride: 110 mmol/L (ref 98–111)
Creatinine, Ser: 1.4 mg/dL — ABNORMAL HIGH (ref 0.61–1.24)
Glucose, Bld: 97 mg/dL (ref 70–99)
HCT: 35 % — ABNORMAL LOW (ref 39.0–52.0)
Hemoglobin: 11.9 g/dL — ABNORMAL LOW (ref 13.0–17.0)
Potassium: 3.6 mmol/L (ref 3.5–5.1)
Sodium: 146 mmol/L — ABNORMAL HIGH (ref 135–145)
TCO2: 22 mmol/L (ref 22–32)

## 2020-08-03 LAB — URINALYSIS, ROUTINE W REFLEX MICROSCOPIC
Bilirubin Urine: NEGATIVE
Glucose, UA: NEGATIVE mg/dL
Ketones, ur: NEGATIVE mg/dL
Nitrite: NEGATIVE
Protein, ur: 100 mg/dL — AB
Specific Gravity, Urine: >1.046 — ABNORMAL HIGH (ref 1.005–1.030)
pH: 9 — ABNORMAL HIGH (ref 5.0–8.0)

## 2020-08-03 LAB — CBC
HCT: 36.1 % — ABNORMAL LOW (ref 39.0–52.0)
Hemoglobin: 10.7 g/dL — ABNORMAL LOW (ref 13.0–17.0)
MCH: 26.7 pg (ref 26.0–34.0)
MCHC: 29.6 g/dL — ABNORMAL LOW (ref 30.0–36.0)
MCV: 90 fL (ref 80.0–100.0)
Platelets: 335 10*3/uL (ref 150–400)
RBC: 4.01 MIL/uL — ABNORMAL LOW (ref 4.22–5.81)
RDW: 15.9 % — ABNORMAL HIGH (ref 11.5–15.5)
WBC: 9.1 10*3/uL (ref 4.0–10.5)
nRBC: 0 % (ref 0.0–0.2)

## 2020-08-03 LAB — APTT: aPTT: 36 seconds (ref 24–36)

## 2020-08-03 LAB — DIFFERENTIAL
Abs Immature Granulocytes: 0.05 10*3/uL (ref 0.00–0.07)
Basophils Absolute: 0 10*3/uL (ref 0.0–0.1)
Basophils Relative: 0 %
Eosinophils Absolute: 0 10*3/uL (ref 0.0–0.5)
Eosinophils Relative: 0 %
Immature Granulocytes: 1 %
Lymphocytes Relative: 10 %
Lymphs Abs: 0.9 10*3/uL (ref 0.7–4.0)
Monocytes Absolute: 0.8 10*3/uL (ref 0.1–1.0)
Monocytes Relative: 8 %
Neutro Abs: 7.3 10*3/uL (ref 1.7–7.7)
Neutrophils Relative %: 81 %

## 2020-08-03 LAB — PROTIME-INR
INR: 1.3 — ABNORMAL HIGH (ref 0.8–1.2)
Prothrombin Time: 15.7 seconds — ABNORMAL HIGH (ref 11.4–15.2)

## 2020-08-03 LAB — SARS CORONAVIRUS 2 BY RT PCR (HOSPITAL ORDER, PERFORMED IN ~~LOC~~ HOSPITAL LAB): SARS Coronavirus 2: NEGATIVE

## 2020-08-03 LAB — ETHANOL: Alcohol, Ethyl (B): <10 mg/dL (ref <10)

## 2020-08-03 LAB — I-STAT VENOUS BLOOD GAS, ED
Acid-Base Excess: 2 mmol/L (ref 0.0–2.0)
Bicarbonate: 27.8 mmol/L (ref 20.0–28.0)
Calcium, Ion: 1.21 mmol/L (ref 1.15–1.40)
HCT: 37 % — ABNORMAL LOW (ref 39.0–52.0)
Hemoglobin: 12.6 g/dL — ABNORMAL LOW (ref 13.0–17.0)
O2 Saturation: 78 %
Potassium: 4.6 mmol/L (ref 3.5–5.1)
Sodium: 146 mmol/L — ABNORMAL HIGH (ref 135–145)
TCO2: 29 mmol/L (ref 22–32)
pCO2, Ven: 49 mmHg (ref 44.0–60.0)
pH, Ven: 7.361 (ref 7.250–7.430)
pO2, Ven: 45 mmHg (ref 32.0–45.0)

## 2020-08-03 LAB — TROPONIN I (HIGH SENSITIVITY): Troponin I (High Sensitivity): 17 ng/L (ref <18)

## 2020-08-03 LAB — RAPID URINE DRUG SCREEN, HOSP PERFORMED
Amphetamines: NOT DETECTED
Barbiturates: NOT DETECTED
Benzodiazepines: POSITIVE — AB
Cocaine: NOT DETECTED
Opiates: NOT DETECTED
Tetrahydrocannabinol: NOT DETECTED

## 2020-08-03 LAB — LACTIC ACID, PLASMA: Lactic Acid, Venous: 1.7 mmol/L (ref 0.5–1.9)

## 2020-08-03 LAB — CBG MONITORING, ED: Glucose-Capillary: 96 mg/dL (ref 70–99)

## 2020-08-03 LAB — AMMONIA: Ammonia: 31 umol/L (ref 9–35)

## 2020-08-03 MED ORDER — FINASTERIDE 5 MG PO TABS
5.0000 mg | ORAL_TABLET | Freq: Every day | ORAL | Status: DC
  Administered 2020-08-05 – 2020-08-06 (×2): 5 mg via ORAL
  Filled 2020-08-03 (×2): qty 1

## 2020-08-03 MED ORDER — STROKE: EARLY STAGES OF RECOVERY BOOK
Freq: Once | Status: DC
  Filled 2020-08-03: qty 1

## 2020-08-03 MED ORDER — ASPIRIN 300 MG RE SUPP
300.0000 mg | Freq: Once | RECTAL | Status: AC
  Administered 2020-08-03: 300 mg via RECTAL
  Filled 2020-08-03: qty 1

## 2020-08-03 MED ORDER — ASPIRIN 300 MG RE SUPP
300.0000 mg | Freq: Every day | RECTAL | Status: DC
  Administered 2020-08-04: 300 mg via RECTAL
  Filled 2020-08-03: qty 1

## 2020-08-03 MED ORDER — SODIUM CHLORIDE 0.9 % IV SOLN: INTRAVENOUS | Status: DC

## 2020-08-03 MED ORDER — ACETAMINOPHEN 650 MG RE SUPP
650.0000 mg | RECTAL | Status: DC | PRN
  Administered 2020-08-04: 650 mg via RECTAL
  Filled 2020-08-03: qty 1

## 2020-08-03 MED ORDER — IOHEXOL 350 MG/ML SOLN
50.0000 mL | Freq: Once | INTRAVENOUS | Status: AC | PRN
  Administered 2020-08-03: 50 mL via INTRAVENOUS

## 2020-08-03 MED ORDER — PANTOPRAZOLE SODIUM 40 MG PO TBEC
40.0000 mg | DELAYED_RELEASE_TABLET | Freq: Every day | ORAL | Status: DC
  Administered 2020-08-05 – 2020-08-06 (×2): 40 mg via ORAL
  Filled 2020-08-03 (×2): qty 1

## 2020-08-03 MED ORDER — SODIUM CHLORIDE 0.9 % IV SOLN
1.0000 g | Freq: Once | INTRAVENOUS | Status: AC
  Administered 2020-08-03: 1 g via INTRAVENOUS
  Filled 2020-08-03: qty 10

## 2020-08-03 MED ORDER — ACETAMINOPHEN 325 MG PO TABS: 650.0000 mg | ORAL_TABLET | ORAL | Status: DC | PRN

## 2020-08-03 MED ORDER — ASPIRIN 325 MG PO TABS
325.0000 mg | ORAL_TABLET | Freq: Every day | ORAL | Status: DC
  Administered 2020-08-05 – 2020-08-06 (×2): 325 mg via ORAL
  Filled 2020-08-03 (×3): qty 1

## 2020-08-03 MED ORDER — ACETAMINOPHEN 160 MG/5ML PO SOLN
650.0000 mg | ORAL | Status: DC | PRN
  Administered 2020-08-04: 650 mg
  Filled 2020-08-03: qty 20.3

## 2020-08-03 MED ORDER — SODIUM CHLORIDE 0.9 % IV SOLN
1.0000 g | INTRAVENOUS | Status: DC
  Administered 2020-08-04 – 2020-08-06 (×3): 1 g via INTRAVENOUS
  Filled 2020-08-03 (×3): qty 10

## 2020-08-03 NOTE — Progress Notes (Signed)
EEG complete - results pending 

## 2020-08-03 NOTE — ED Notes (Signed)
RN messaged Dr. Roel Cluck to notify her that pt was unable to complete scans.

## 2020-08-03 NOTE — ED Notes (Signed)
Pt returned from MRI. Pt not willing to lay on his back at this time.

## 2020-08-03 NOTE — ED Provider Notes (Signed)
[facility] EMERGENCY DEPARTMENT Provider Note   CSN: [id number] Arrival date & time: 08/03/20  1636     History No chief complaint on file.   Samuel Shelton is a 69 y.o. male.  Patient with h/o multiple CVA early 2020, PEG tube, UTI presents to ED from nursing facility [facility] in [place]), wheelchair bound at baseline, as Code stroke. Level V caveat 2/2 altered mental status.   Per EMS report, ate lunch around noon.  Typically will talk and communicate a little bit.  Around 2:15 PM today, patient was noted to be less talkative.  Later was found with right facial droop and suspected right-sided weakness/lean.  EMS was called for transport.  Patient was given 5 mg of Versed due to combativeness prior to arrival.  Family at bedside reports he was feeding himself as of 2 days ago.   MRI 01/2019 1. 16 mm acute/early subacute infarction in left ventral thalamus and posterior limb of internal capsule. Additional punctate foci of infarction are present within left hemi pons, right occipital periventricular white matter, and left posterior temporal periventricular white matter.         Past Medical History:  Diagnosis Date  . Chronic mental illness   . Edema of both legs   . Elevated PSA   . Elevated PSA   . Hypertension   . Mental retardation   . Phimosis     Patient Active Problem List   Diagnosis Date Noted  . Gross hematuria   . Oropharyngeal dysphagia   . Acute gastritis without hemorrhage   . Postpyloric ulcer   . Protein-calorie malnutrition, severe ([place])   . Palliative care by specialist   . DNR (do not resuscitate) discussion   . Hypernatremia 01/08/2020  . Hypercalcemia 01/08/2020  . Acute metabolic encephalopathy 38/18/2993  . COVID-19 virus infection 01/08/2020  . Acute renal failure ([place]) 01/08/2020  . Dehydration 01/08/2020  . Elevated troponin 01/08/2020  . Lactic acidosis 01/08/2020  . Altered mental status   .  Hyperlipidemia 01/29/2019  . Family hx-stroke 01/29/2019  . BPH (benign prostatic hyperplasia) 01/29/2019  . Stroke (cerebrum) ([place]) 01/28/2019  . Edema leg 08/20/2014  . Chronic mental disorder 08/20/2014  . HTN (hypertension) 08/20/2014  . Intellectual disability 08/20/2014    Past Surgical History:  Procedure Laterality Date  . PEG PLACEMENT N/A 01/24/2020   Procedure: PERCUTANEOUS ENDOSCOPIC GASTROSTOMY (PEG) PLACEMENT;  Surgeon: [name], MD;  Location: [facility] ENDOSCOPY;  Service: Endoscopy;  Laterality: N/A;  . PROSTATE BIOPSY N/A 06/22/2015   Procedure: PROSTATE BIOPSY;  Surgeon: Hollice Espy, MD;  Location: [facility] ORS;  Service: Urology;  Laterality: N/A;  . PROSTATE BIOPSY N/A 09/27/2016   Procedure: PROSTATE BIOPSY;  Surgeon: Hollice Espy, MD;  Location: [facility] ORS;  Service: Urology;  Laterality: N/A;       Family History  Problem Relation Age of Onset  . Stroke Mother   . Hypertension Sister   . Diabetes type II Sister     Social History   Tobacco Use  . Smoking status: Former Smoker    Packs/day: 0.50    Types: Cigarettes  . Smokeless tobacco: Never Used  Vaping Use  . Vaping Use: Never used  Substance Use Topics  . Alcohol use: No    Alcohol/week: 0.0 standard drinks  . Drug use: No    Home Medications Prior to Admission medications   Medication Sig Start Date End Date Taking? Authorizing Provider  acetaminophen (TYLENOL) 500 MG tablet  Take 500 mg by mouth every 4 (four) hours as needed for mild pain.    [provider]  finasteride (PROSCAR) 5 MG tablet Take 1 tablet (5 mg total) by mouth daily. 09/05/18   Hollice Espy, MD  Nutritional Supplements (FEEDING SUPPLEMENT, JEVITY 1.5 CAL/FIBER,) LIQD Place 1,000 mLs into feeding tube continuous. 01/28/20   Fritzi Mandes, MD  pantoprazole sodium (PROTONIX) 40 mg/20 mL PACK Place 20 mLs (40 mg total) into feeding tube daily. 01/28/20   Fritzi Mandes, MD  tamsulosin (FLOMAX) 0.4 MG CAPS capsule Take 1  capsule (0.4 mg total) by mouth daily. 10/27/17   Hollice Espy, MD  Water For Irrigation, Sterile (FREE WATER) SOLN Place 120 mLs into feeding tube every 4 (four) hours. 01/28/20   Fritzi Mandes, MD    Allergies    Patient has no known allergies.  Review of Systems   Review of Systems  Unable to perform ROS: Mental status change    Physical Exam Updated Vital Signs There were no vitals taken for this visit.  Physical Exam Vitals and nursing note reviewed.  Constitutional:      Appearance: He is well-developed.  HENT:     Head: Normocephalic and atraumatic.     Right Ear: External ear normal.     Left Ear: External ear normal.     Nose: Nose normal.  Eyes:     General:        Right eye: No discharge.        Left eye: No discharge.     Conjunctiva/sclera: Conjunctivae normal.  Cardiovascular:     Rate and Rhythm: Regular rhythm. Tachycardia present.     Heart sounds: Normal heart sounds.  Pulmonary:     Effort: Pulmonary effort is normal.     Breath sounds: Normal breath sounds.  Abdominal:     Palpations: Abdomen is soft.     Tenderness: There is no abdominal tenderness.  Genitourinary:    Comments: Stool brown Musculoskeletal:     Cervical back: Normal range of motion and neck supple.  Skin:    General: Skin is warm and dry.  Neurological:     Mental Status: He is easily aroused. He is disoriented.     GCS: GCS eye subscore is 4. GCS verbal subscore is 2. GCS motor subscore is 4.     Comments: Patient is minimally cooperative with exam.  He does not follow directions.  He withdraws from pain in the upper extremities.  Withdraws from pain in the left lower extremity.  Right-sided facial droop noted.  Patient grunts and tries to withdraw when rectal temperature measured.  Psychiatric:        Behavior: Behavior is uncooperative.     ED Results / Procedures / Treatments   Labs (all labs ordered are listed, but only abnormal results are displayed) Labs Reviewed    PROTIME-INR - Abnormal; Notable for the following components:      Result Value   Prothrombin Time 15.7 (*)    INR 1.3 (*)    All other components within normal limits  CBC - Abnormal; Notable for the following components:   RBC 4.01 (*)    Hemoglobin 10.7 (*)    HCT 36.1 (*)    MCHC 29.6 (*)    RDW 15.9 (*)    All other components within normal limits  COMPREHENSIVE METABOLIC PANEL - Abnormal; Notable for the following components:   Glucose, Bld 102 (*)    BUN 34 (*)  Creatinine, Ser 1.51 (*)    Albumin 2.9 (*)    AST 62 (*)    GFR calc non Af Amer 46 (*)    GFR calc Af Amer 53 (*)    All other components within normal limits  I-STAT CHEM 8, ED - Abnormal; Notable for the following components:   Sodium 146 (*)    BUN 33 (*)    Creatinine, Ser 1.40 (*)    Hemoglobin 11.9 (*)    HCT 35.0 (*)    All other components within normal limits  SARS CORONAVIRUS 2 BY RT PCR (HOSPITAL ORDER, [facility])  CULTURE, BLOOD (ROUTINE X 2)  CULTURE, BLOOD (ROUTINE X 2)  ETHANOL  APTT  DIFFERENTIAL  LACTIC ACID, PLASMA  RAPID URINE DRUG SCREEN, HOSP PERFORMED  URINALYSIS, ROUTINE W REFLEX MICROSCOPIC  CBG MONITORING, ED    EKG EKG Interpretation  Date/Time:  Monday August 03 2020 17:09:21 EDT Ventricular Rate:  107 PR Interval:    QRS Duration: 99 QT Interval:  341 QTC Calculation: 455 R Axis:   48 Text Interpretation: Sinus tachycardia Probable left ventricular hypertrophy Anterior Q waves, possibly due to LVH Nonspecific T abnormalities, lateral leads Baseline wander in lead(s) V2 Interpretation limited secondary to artifact Confirmed by Quintella Reichert [id number]) on 08/03/2020 5:17:58 PM   Radiology CT HEAD CODE STROKE WO CONTRAST  Result Date: 08/03/2020 CLINICAL DATA:  Code stroke. Neuro deficit, acute, stroke suspected. Additional history provided: Right-sided weakness. EXAM: CT HEAD WITHOUT CONTRAST TECHNIQUE: Contiguous axial images were obtained  from the base of the skull through the vertex without intravenous contrast. COMPARISON:  Head CT 01/08/2020, brain MRI 01/28/2019. FINDINGS: Brain: Stable moderate to advanced generalized parenchymal atrophy. 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 As before, there is advanced patchy and confluent hypodensity within the cerebral white matter and pons which is nonspecific, but consistent with chronic small vessel ischemic disease. There is no acute intracranial hemorrhage. No acute demarcated cortical infarct is identified. No extra-axial fluid collection. No evidence of intracranial mass. No midline shift. Vascular: No hyperdense vessel.  Atherosclerotic calcifications Skull: Normal. Negative for fracture or focal lesion. Sinuses/Orbits: Visualized orbits show no acute finding. No significant paranasal sinus disease or mastoid effusion at the imaged levels. ASPECTS ([place] Stroke Program Early CT Score) - Ganglionic level infarction (caudate, lentiform nuclei, internal capsule, insula, M1-M3 cortex): 7 - Supraganglionic infarction (M4-M6 cortex): 3 Total score (0-10 with 10 being normal): 10 Other: 1.6 cm anterior right scalp lipoma (series 3, image 14). Stroke code: [name]. No evidence of acute intracranial abnormality on non-contrast head CT. Kellie Simmering) [phone]). IMPRESSION: No CT evidence of acute intracranial abnormality. 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 Stable moderate/advanced generalized parenchymal atrophy and advanced chronic small vessel ischemic disease. Electronically Signed   By: Kellie Simmering DO   On: 08/03/2020 16:59    Procedures Procedures (including critical care time)  Medications Ordered in ED Medications  0.9 %  sodium chloride infusion (has no administration in time range)  aspirin suppository 300 mg (has no administration in time range)  iohexol (OMNIPAQUE) 350 MG/ML injection 50  mL (50 mLs Intravenous Contrast Given 08/03/20 1708)  cefTRIAXone (ROCEPHIN) 1 g in sodium chloride 0.9 % 100 mL IVPB (0 g Intravenous Stopped 08/03/20 1843)    ED Course  I have reviewed the triage vital signs and the nursing notes.  Pertinent labs & imaging results that were available during my  care of the patient were reviewed by me and considered in my medical decision making (see chart for details).  Patient seen and examined. Work-up initiated. Medications ordered.  Rectal temp 100.50F.   Vital signs reviewed and are as follows: BP 124/83 (BP Location: Left Arm)   Pulse 100   Temp 100.2 F (37.9 C) (Rectal)   Resp (!) 25   Ht 6' (1.829 m)   Wt 78.5 kg   SpO2 95%   BMI 23.47 kg/m   Noncontrast head CT is negative.  Awaiting neurology recommendations.  5:41 PM Spoke with Dr. Erlinda Hong of neuro. Pt will need admission for CVA eval, including MRI and EEG. CXR reviewed.   6:45 PM Lactic acid normal. Awaiting UA. Will call admission request.   7:08 PM Discussed with Dr. Roel Cluck who will see.    MDM Rules/Calculators/A&P                          Admit.   Final Clinical Impression(s) / ED Diagnoses Final diagnoses:  Encephalopathy  Right sided weakness    Rx / DC Orders ED Discharge Orders    None       Suann Larry 08/04/20 2326    Quintella Reichert, MD 08/05/20 [phone]

## 2020-08-03 NOTE — ED Notes (Signed)
Gave pt a sip of water to try swallow eval again. Pt took a few sips without problem but began to cough after continuing to drink.

## 2020-08-03 NOTE — Code Documentation (Signed)
Stroke Response Nurse Documentation Code Documentation  Vamsi Apfel is a 69 y.o. male arriving to [facility] ED via [facility] on 8/9 with past medical hx of  CVA, MR, Prostate CA, HTN, Peg placement. Code stroke was activated by EMS. Patient from nursing home where he was LKW at noon and now complaining of right facial droop and non verbal . On No antithrombotic. Stroke team at the bedside on patient arrival. Labs drawn and patient cleared for CT by EDP. Patient to CT with team. NIHSS 11, see documentation for details and code stroke times. Patient with decreased LOC, disoriented, not following commands, right facial droop, right leg weakness, Global aphasia  and dysarthria  on exam. The following imaging was completed: CT, CTA head and neck. Patient is not a candidate for tPA due to being outside window Care/Plan. Bedside handoff with ED RN Maudie Mercury.    Raliegh Ip  Stroke Response RN

## 2020-08-03 NOTE — ED Notes (Signed)
Per Dr. Roel Cluck, Scans can be attempted again in the AM.

## 2020-08-03 NOTE — ED Notes (Signed)
EEG at bedside. Will give ASA suppository when pt is done with EEG

## 2020-08-03 NOTE — ED Notes (Signed)
Pt pulled off external urinary catheter. Small amount sent to lab. Pt's bed linens changed, brief put on pt, pt gown changed.

## 2020-08-03 NOTE — Consult Note (Signed)
Stroke Neurology Consultation Note  Consult Requested by: Dr. Ralene Bathe  Reason for Consult: code stroke  Consult Date: 08/03/20   The history was obtained from the EMS.  During history and examination, all items were not able to obtain unless otherwise noted.  History of Present Illness:  Samuel Shelton is a 69 y.o. 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  As per EMS, pt lives in [place] and at baseline wheelchair bound but able to talk. Last seen normal at noon when he was talking. However, at 14:25pm he was seen by staff with normal symmetrical face but did not talk. Later when staff checked him again, found him to have right facial droop, drooling, food coming out from his mouth on the right side, however, pt also kept leaning towards right. Pt was nonverbal at that time. EMS called. On arrival, pt was combative. Has to give 5mg  of versed, after that pt calmed down. Glucose 114, BP 100/67, HR 110. In ER, pt eyes open but not responding to commands or questions. Body leaning towards right and resistant of moving arm or legs. CT no acute abnormalities.  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 He had left BG/CR infarct in 01/2019 s/p tPA at that time. Seems to have no residue deficit. However, per note, he spoke words but no sentences. He was discharged with DAPT. However, no antiplatelets were in his medication list this time.  LSN: noon today tPA Given: No: outside window  Past Medical History:  Diagnosis Date  . Chronic mental illness   . Edema of both legs   . Elevated PSA   . Elevated PSA   . Hypertension   . Mental retardation   . Phimosis     Past Surgical History:  Procedure Laterality Date  . PEG PLACEMENT N/A 01/24/2020   Procedure: PERCUTANEOUS ENDOSCOPIC GASTROSTOMY (PEG) PLACEMENT;  Surgeon: [name], MD;  Location: [facility]  ENDOSCOPY;  Service: Endoscopy;  Laterality: N/A;  . PROSTATE BIOPSY N/A 06/22/2015   Procedure: PROSTATE BIOPSY;  Surgeon: Hollice Espy, MD;  Location: [facility] ORS;  Service: Urology;  Laterality: N/A;  . PROSTATE BIOPSY N/A 09/27/2016   Procedure: PROSTATE BIOPSY;  Surgeon: Hollice Espy, MD;  Location: [facility] ORS;  Service: Urology;  Laterality: N/A;    Family History  Problem Relation Age of Onset  . Stroke Mother   . Hypertension Sister   . Diabetes type II Sister     Social History:  reports that he has quit smoking. His smoking use included cigarettes. He smoked 0.50 packs per day. He has never used smokeless tobacco. He reports that he does not drink alcohol and does not use drugs.  Allergies: No Known Allergies  No current facility-administered medications on file prior to encounter.   Current Outpatient Medications on File Prior to Encounter  Medication Sig Dispense Refill  . acetaminophen (TYLENOL) 500 MG tablet Take 500 mg by mouth every 4 (four) hours as needed for mild pain.    . finasteride (PROSCAR) 5 MG tablet Take 1 tablet (5 mg total) by mouth daily. 30 tablet 11  . Nutritional Supplements (FEEDING SUPPLEMENT, JEVITY 1.5 CAL/FIBER,) LIQD Place 1,000 mLs into feeding tube continuous. 100 mL 0  . pantoprazole sodium (PROTONIX) 40 mg/20 mL PACK Place 20 mLs (40 mg total) into feeding tube daily. 30 mL 0  .  tamsulosin (FLOMAX) 0.4 MG CAPS capsule Take 1 capsule (0.4 mg total) by mouth daily. 30 capsule 11  . Water For Irrigation, Sterile (FREE WATER) SOLN Place 120 mLs into feeding tube every 4 (four) hours. 100 mL 0    Review of Systems: A full ROS was attempted today and was not able to be performed.   Physical Examination:    General - well nourished, well developed, in no apparent distress.    Ophthalmologic - fundi not visualized due to noncooperation.    Cardiovascular - regular rhythm and rate  Neuro - eyes open but not alert. Eyes staring off not blinking  to visual threat on tracking. Nonverbal, not answering questions or follows commands. Eyes midline, sluggish doll's eyes. Facial symmetrical. Tongue protrusion not cooperative. BUE able to hold against gravity briefly and then drift, symmetrical. BLE withdraw to pain, L>R. However, pt is in leaning to right position, resistant to be corrected, difficulty exam strength on the RLE. Increased muscle tone bilaterally. Sensation, coordination and gait not tested.   Data Reviewed: No results found.  Assessment: 69 y.o. male with PMH of MR, seizure like activity, stroke in 01/2019 s/p tPA, prostate cancer, PEG placement, HTN presented to ER for code stroke with right side leaning, right facial droop, drooling and nonverbal. Combative with EMS and received versed. In ER, pt stare off, not responding to questions, not following commands. However, no significant focal deficit, but non verbal. CT no acute abnormalities. Pt not tPA candidate due to just outside window. Not IR candidate due to poor baseline. Will do regular stroke work up with CTA head and neck and MRI, also do EEG, as well as encephalopathy work up.   Stroke Risk Factors - hypertension and history of stroke  Plan: - HgbA1c, fasting lipid panel - MRI of the brain without contrast - CTA head and neck - EEG - encephalopathy work up (check UTI, ammonia, CBC, CMP) - PT consult, OT consult, Speech consult - Echocardiogram - Prophylactic therapy-Antiplatelet med: Aspirin - dose 300 PR - Risk factor modification - Telemetry monitoring - Frequent neuro checks  Thank you for this consultation and allowing [place] to participate in the care of this patient.  Rosalin Hawking, MD PhD Stroke Neurology 08/03/2020 5:23 PM

## 2020-08-03 NOTE — ED Notes (Signed)
Pt transported to MRI 

## 2020-08-03 NOTE — H&P (Addendum)
Samuel Shelton MBW:[id number] DOB: Sep 02, 1951 DOA: 08/03/2020     PCP: Sofie Hartigan, MD      Patient arrived to ER on 08/03/20 at 1636 Referred by Attending Quintella Reichert, MD   Patient coming from:   From facility in [place]:   Chief Complaint  Patient presents with  . Code Stroke    HPI: Samuel Shelton is a 69 y.o. male with medical history significant of CVA, Intellectual disability Prostate CA, HTN, Peg placement, Covid positive in January 2021, Prostate cancer   Presented with CVA, MR, Prostate CA, HTN, Peg placement last time was seen was at noon today, was brought as code stroke  Patient is not a candidate for tPA due to being outside window  initial BP 96/88 HR 111 on arrival by EMS patient combative at baseline wheelchair bound but able to talk Last CVA was 01/2019 s/p tPA at that time no residual deficit. He was discharged on DAPT but currently not on it. Has PEG but ready to come out he has been able to eat  Infectious risk factors:  Reports none  Has  NOt been vaccinated against COVID (family is interested)   Initial COVID TEST  NEGATIVE   Lab Results  Component Value Date   [place] NEGATIVE 08/03/2020   SARSCOV2NAA POSITIVE (A) 01/11/2020     Regarding pertinent Chronic problems:     Hx of CVA - with residual deficits on Aspirin 81 mg, 325, Plavix  CKD stage III- baseline Cr  Estimated Creatinine Clearance: 53.1 mL/min (A) (by C-G formula based on SCr of 1.4 mg/dL (H)).  Lab Results  Component Value Date   CREATININE 1.40 (H) 08/03/2020   CREATININE 1.51 (H) 08/03/2020   CREATININE 1.43 (H) 01/29/2020    BPH - on Flomax, Proscar     Chronic anemia - baseline hg Hemoglobin & Hematocrit  Recent Labs    01/25/20 2234 08/03/20 1641 08/03/20 1648  HGB 9.9* 10.7* 11.9*     While in ER: Code stroke cancelled no a candidate for TPA out of the window Ct head WNL  ER Provider Called: Neurology     Dr Erlinda Hong They  Recommend admit to medicine   seen  in ER  Hospitalist was called for admission for Acute encephalopathy and right side weakness  The following Work up has been ordered so far:  Orders Placed This Encounter  Procedures  . SARS Coronavirus 2 by RT PCR (hospital order, performed in [facility] hospital lab) Nasopharyngeal Nasopharyngeal Swab  . Blood culture (routine x 2)  . CT HEAD CODE STROKE WO CONTRAST  . CT ANGIO HEAD W OR WO CONTRAST  . CT ANGIO NECK W OR WO CONTRAST  . DG Chest Port 1 View  . MR BRAIN WO CONTRAST  . Ethanol  . Protime-INR  . APTT  . CBC  . Differential  . Comprehensive metabolic panel  . Urine rapid drug screen (hosp performed)  . Urinalysis, Routine w reflex microscopic  . Lactic acid, plasma  . Diet NPO time specified  . Vital signs  . Cardiac Monitoring  . Modified Stroke Scale (mNIHSS) Document mNIHSS assessment every 2 hours for a total of 12 hours  . Stroke swallow screen  . Initiate Carrier Fluid Protocol  . If O2 sat If O2 Sat < 94%, administer O2 at 2 liters/minute via nasal cannula.  . In and Out Cath  . Janeece Riggers to [employer] [phone]  .  Consult for [place] Admission  ALL PATIENTS BEING ADMITTED/HAVING PROCEDURES NEED COVID-19 SCREENING  . Pulse oximetry, continuous  . CBG monitoring, ED  . I-stat chem 8, ED  . ED EKG  . EKG 12-Lead  . EEG adult  . Saline lock IV    Following Medications were ordered in ER: Medications  0.9 %  sodium chloride infusion (has no administration in time range)  aspirin suppository 300 mg (has no administration in time range)  iohexol (OMNIPAQUE) 350 MG/ML injection 50 mL (50 mLs Intravenous Contrast Given 08/03/20 1708)  cefTRIAXone (ROCEPHIN) 1 g in sodium chloride 0.9 % 100 mL IVPB (1 g Intravenous New Bag/Given 08/03/20 1813)      Significant initial  Findings: Abnormal Labs Reviewed  PROTIME-INR - Abnormal; Notable for the following components:      Result Value    Prothrombin Time 15.7 (*)    INR 1.3 (*)    All other components within normal limits  CBC - Abnormal; Notable for the following components:   RBC 4.01 (*)    Hemoglobin 10.7 (*)    HCT 36.1 (*)    MCHC 29.6 (*)    RDW 15.9 (*)    All other components within normal limits  COMPREHENSIVE METABOLIC PANEL - Abnormal; Notable for the following components:   Glucose, Bld 102 (*)    BUN 34 (*)    Creatinine, Ser 1.51 (*)    Albumin 2.9 (*)    AST 62 (*)    GFR calc non Af Amer 46 (*)    GFR calc Af Amer 53 (*)    All other components within normal limits  I-STAT CHEM 8, ED - Abnormal; Notable for the following components:   Sodium 146 (*)    BUN 33 (*)    Creatinine, Ser 1.40 (*)    Hemoglobin 11.9 (*)    HCT 35.0 (*)    All other components within normal limits   Otherwise labs showing:    Recent Labs  Lab 08/03/20 1641 08/03/20 1648  NA 144 146*  K 3.8 3.6  CO2 24  --   GLUCOSE 102* 97  BUN 34* 33*  CREATININE 1.51* 1.40*  CALCIUM 9.1  --     Cr    Stable,  Lab Results  Component Value Date   CREATININE 1.40 (H) 08/03/2020   CREATININE 1.51 (H) 08/03/2020   CREATININE 1.43 (H) 01/29/2020    Recent Labs  Lab 08/03/20 1641  AST 62*  ALT 40  ALKPHOS 50  BILITOT 0.8  PROT 7.3  ALBUMIN 2.9*   Lab Results  Component Value Date   CALCIUM 9.1 08/03/2020   PHOS 2.6 01/29/2020    WBC    Component Value Date/Time   WBC 9.1 08/03/2020 1641   ANC    Component Value Date/Time   NEUTROABS 7.3 08/03/2020 1641    Plt: Lab Results  Component Value Date   PLT 335 08/03/2020    Lactic Acid, Venous    Component Value Date/Time   LATICACIDVEN 1.7 08/03/2020 1714     Lab Results  Component Value Date   SARSCOV2NAA NEGATIVE 08/03/2020   SARSCOV2NAA POSITIVE (A) 01/11/2020     HG/HCT   Stable     Component Value Date/Time   HGB 11.9 (L) 08/03/2020 1648   HCT 35.0 (L) 08/03/2020 1648       trop 17 ECG: Ordered Personally reviewed by me  showing: HR : 107 Rhythm:  Sinus tachycardia  LVH  no evidence of ischemic changes QTC 455   DM  labs:  HbA1C: Recent Labs    01/09/20 0139  HGBA1C 6.7*       CBG (last 3)  Recent Labs    08/03/20 1638  GLUCAP 96       UA   evidence of UTI   Urine analysis:    Component Value Date/Time   COLORURINE AMBER (A) 08/03/2020 2204   APPEARANCEUR CLOUDY (A) 08/03/2020 2204   APPEARANCEUR Hazy (A) 09/04/2019 1051   LABSPEC >1.046 (H) 08/03/2020 2204   PHURINE 9.0 (H) 08/03/2020 2204   GLUCOSEU NEGATIVE 08/03/2020 2204   HGBUR SMALL (A) 08/03/2020 2204   BILIRUBINUR NEGATIVE 08/03/2020 2204   BILIRUBINUR Negative 09/04/2019 [place] 08/03/2020 2204   PROTEINUR 100 (A) 08/03/2020 2204   NITRITE NEGATIVE 08/03/2020 2204   LEUKOCYTESUR LARGE (A) 08/03/2020 2204      Ordered  CT HEAD    NON acute  CXR - cardiomegaly  Ct showed 4.4 Ascending thoracic aortic aneurysm measuring 4.4 cm in diameter. Recommend annual imaging followup by CTA or MRA.     ED Triage Vitals  Enc Vitals Group     BP 08/03/20 1717 124/83     Pulse Rate 08/03/20 1717 100     Resp 08/03/20 1717 (!) 25     Temp 08/03/20 1717 100.2 F (37.9 C)     Temp Source 08/03/20 1717 Rectal     SpO2 08/03/20 1717 95 %     Weight 08/03/20 1718 173 lb 1 oz (78.5 kg)     Height 08/03/20 1718 6' (1.829 m)     Head Circumference --      Peak Flow --      Pain Score --      Pain Loc --      Pain Edu? --      Excl. in [place]? --   TMAX(24)@       Latest  Blood pressure 124/83, pulse 100, temperature 100.2 F (37.9 C), temperature source Rectal, resp. rate (!) 25, height 6' (1.829 m), weight 78.5 kg, SpO2 95 %.    Review of Systems:    Pertinent positives include:  Confusion localizing neurological complaints,  Constitutional:  No weight loss, night sweats, Fevers, chills, fatigue, weight loss  HEENT:  No headaches, Difficulty swallowing,Tooth/dental problems,Sore throat,  No sneezing,  itching, ear ache, nasal congestion, post nasal drip,  Cardio-vascular:  No chest pain, Orthopnea, PND, anasarca, dizziness, palpitations.no Bilateral lower extremity swelling  GI:  No heartburn, indigestion, abdominal pain, nausea, vomiting, diarrhea, change in bowel habits, loss of appetite, melena, blood in stool, hematemesis Resp:  no shortness of breath at rest. No dyspnea on exertion, No excess mucus, no productive cough, No non-productive cough, No coughing up of blood.No change in color of mucus.No wheezing. Skin:  no rash or lesions. No jaundice GU:  no dysuria, change in color of urine, no urgency or frequency. No straining to urinate.  No flank pain.  Musculoskeletal:  No joint pain or no joint swelling. No decreased range of motion. No back pain.  Psych:  No change in mood or affect. No depression or anxiety. No memory loss.  Neuro: no  no tingling, no weakness, no double vision, no gait abnormality, no slurred speech, no   All systems reviewed and apart from [place] all are negative  Past Medical History:   Past Medical History:  Diagnosis Date  . Chronic mental illness   .  Edema of both legs   . Elevated PSA   . Elevated PSA   . Hypertension   . Mental retardation   . Phimosis      Past Surgical History:  Procedure Laterality Date  . PEG PLACEMENT N/A 01/24/2020   Procedure: PERCUTANEOUS ENDOSCOPIC GASTROSTOMY (PEG) PLACEMENT;  Surgeon: [name], MD;  Location: [facility] ENDOSCOPY;  Service: Endoscopy;  Laterality: N/A;  . PROSTATE BIOPSY N/A 06/22/2015   Procedure: PROSTATE BIOPSY;  Surgeon: Hollice Espy, MD;  Location: [facility] ORS;  Service: Urology;  Laterality: N/A;  . PROSTATE BIOPSY N/A 09/27/2016   Procedure: PROSTATE BIOPSY;  Surgeon: Hollice Espy, MD;  Location: [facility] ORS;  Service: Urology;  Laterality: N/A;    Social History:       reports that he has quit smoking. His smoking use included cigarettes. He smoked 0.50 packs per day. He has never used  smokeless tobacco. He reports that he does not drink alcohol and does not use drugs.    Family History:   Family History  Problem Relation Age of Onset  . Stroke Mother   . Hypertension Sister   . Diabetes type II Sister     Allergies: No Known Allergies   Prior to Admission medications   Medication Sig Start Date End Date Taking? Authorizing Provider  acetaminophen (TYLENOL) 500 MG tablet Take 500 mg by mouth every 4 (four) hours as needed for mild pain.    [provider]  finasteride (PROSCAR) 5 MG tablet Take 1 tablet (5 mg total) by mouth daily. 09/05/18   Hollice Espy, MD  Nutritional Supplements (FEEDING SUPPLEMENT, JEVITY 1.5 CAL/FIBER,) LIQD Place 1,000 mLs into feeding tube continuous. 01/28/20   Fritzi Mandes, MD  pantoprazole sodium (PROTONIX) 40 mg/20 mL PACK Place 20 mLs (40 mg total) into feeding tube daily. 01/28/20   Fritzi Mandes, MD  tamsulosin (FLOMAX) 0.4 MG CAPS capsule Take 1 capsule (0.4 mg total) by mouth daily. 10/27/17   Hollice Espy, MD  Water For Irrigation, Sterile (FREE WATER) SOLN Place 120 mLs into feeding tube every 4 (four) hours. 01/28/20   Fritzi Mandes, MD   Physical Exam: Vitals with BMI 08/03/2020 01/29/2020 01/29/2020  Height 6\' 0"  - -  Weight 173 lbs 1 oz - -  BMI 41.63 - -  Systolic 845 364 680  Diastolic 83 96 99  Pulse 321 104 113     1. General:  in No Acute distress    Chronically ill  -appearing 2. Psychological: Alert and    Oriented 3. Head/ENT:   Dry Mucous Membranes                          Head Non traumatic, neck supple                           Poor Dentition 4. SKIN: decreased Skin turgor,  Skin clean Dry and intact no rash 5. Heart: Regular rate and rhythm no  Murmur, no Rub or gallop 6. Lungs:  Clear to auscultation bilaterally, no wheezes or crackles   7. Abdomen: Soft,  non-tender, Non distended bowel sounds present 8. Lower extremities: no clubbing, cyanosis, no  edema 9. Neurologically Grossly intact, moving all 4  extremities equally   10. MSK: Normal range of motion   All other LABS:     Recent Labs  Lab 08/03/20 1641 08/03/20 1648  WBC 9.1  --   NEUTROABS 7.3  --  HGB 10.7* 11.9*  HCT 36.1* 35.0*  MCV 90.0  --   PLT 335  --      Recent Labs  Lab 08/03/20 1641 08/03/20 1648  NA 144 146*  K 3.8 3.6  CL 108 110  CO2 24  --   GLUCOSE 102* 97  BUN 34* 33*  CREATININE 1.51* 1.40*  CALCIUM 9.1  --      Recent Labs  Lab 08/03/20 1641  AST 62*  ALT 40  ALKPHOS 50  BILITOT 0.8  PROT 7.3  ALBUMIN 2.9*       Cultures:    Component Value Date/Time   SDES  01/24/2020 1558    URINE, RANDOM Performed at [facility], [address], [place], [place] [address]    [facility]  01/24/2020 1558    NONE Performed at [facility], [address] [place], [place] [address]    CULT >=100,000 COLONIES/mL PROTEUS MIRABILIS (A) 01/24/2020 1558   REPTSTATUS 01/26/2020 FINAL 01/24/2020 1558     Radiological Exams on Admission: CT ANGIO HEAD W OR WO CONTRAST  Result Date: 08/03/2020 CLINICAL DATA:  Stroke/TIA, assess intracranial arteries. Right-sided weakness. EXAM: CT ANGIOGRAPHY HEAD AND NECK TECHNIQUE: Multidetector CT imaging of the head and neck was performed using the standard protocol during bolus administration of intravenous contrast. Multiplanar CT image reconstructions and MIPs were obtained to evaluate the vascular anatomy. Carotid stenosis measurements (when applicable) are obtained utilizing NASCET criteria, using the distal internal carotid diameter as the denominator. CONTRAST:  73mL OMNIPAQUE IOHEXOL 350 MG/ML SOLN COMPARISON:  Noncontrast head CT 08/03/2020, CT angiogram head/neck 01/28/2019 FINDINGS: CTA NECK FINDINGS Aortic arch: Ascending thoracic aortic aneurysm measuring up to 4.4 cm in diameter. Atherosclerotic plaque within the visualized aortic arch and proximal major branch vessels of the neck. No hemodynamically significant innominate or  proximal subclavian artery stenosis. Right carotid system: CCA and ICA patent within the neck without significant stenosis (50% or greater). Mild atherosclerotic plaque within the carotid bifurcation. Left carotid system: Streak artifact from a dense left-sided contrast bolus limits evaluation of the CCA. Within this limitation, the CCA and ICA are patent within the neck without appreciable hemodynamically significant stenosis. Mild calcified plaque within the carotid bifurcation. Vertebral arteries: Streak artifact from a dense left-sided contrast bolus limits evaluation of the left vertebral artery origin and V1 segment. Within this limitation, the vertebral arteries are patent within the neck without appreciable hemodynamically significant stenosis. Skeleton: No acute bony abnormality or aggressive osseous lesion. Cervical spondylosis. Other neck: No cervical lymphadenopathy.  Thyroid unremarkable. Upper chest: No consolidation within the imaged lung apices. Review of the MIP images confirms the above findings CTA HEAD FINDINGS Anterior circulation: Evaluation of the intracranial arterial circulation is limited due to a suboptimal contrast bolus. The intracranial internal carotid arteries are patent. Calcified plaque within both vessels. Redemonstrated moderate to moderately severe stenosis of the cavernous right ICA. The M1 middle cerebral arteries are patent without significant stenosis. No M2 proximal branch occlusion is identified. Atherosclerotic irregularity of the M2 and more distal MCA branches bilaterally, most notably as follows. Moderate stenosis at the origin of a superior division M2 left MCA branch (series 8, image 24). Moderate focal stenosis within a superior division mid to distal M2 left MCA branch (series 9, image 36). Additionally, there are multifocal stenoses within a proximal to mid inferior division left M2 MCA branch with sites of up to severe stenosis (series 9, image 37). Multifocal  moderate/severe stenoses within mid to distal  division right M2 MCA branches. The anterior cerebral arteries are patent bilaterally. Atherosclerotic irregularity of both vessels. Additionally, there are apparent multifocal high-grade stenoses within the A2/A3 left anterior cerebral artery. No intracranial aneurysm is identified. Posterior circulation: The intracranial vertebral arteries are patent. Calcified plaque within both vessels. Redemonstrated moderate/severe stenosis within the V4 right vertebral artery. Mild stenosis of the V4 left vertebral artery. The basilar artery is patent. The posterior cerebral arteries are patent bilaterally. Atherosclerotic irregularity of both vessels. Most notably, there is a high-grade stenosis within the P2 left posterior cerebral artery (series 9, image 35). Posterior cerebral arteries are hypoplastic or absent bilaterally. Venous sinuses: Poorly assessed due to contrast bolus timing. Anatomic variants: As described Review of the MIP images confirms the above findings No evidence of emergent large vessel occlusion. These results were communicated to DR. XU At 5:59 pmon 8/9/2021by text page via the [facility] messaging system. IMPRESSION: CTA neck: 1. Streak artifact from a dense left-sided contrast bolus limits evaluation of the CCA and the left vertebral artery origin and V1 segment. Within this limitation, the bilateral common carotid, internal carotid and vertebral arteries are patent within the neck without appreciable hemodynamically significant stenosis. 2. Ascending thoracic aortic aneurysm measuring 4.4 cm in diameter. Recommend annual imaging followup by CTA or MRA. This recommendation follows 2010 ACCF/AHA/AATS/ACR/ASA/SCA/SCAI/SIR/STS/SVM Guidelines for the Diagnosis and Management of Patients with Thoracic Aortic Disease. Circulation. 2010; 121: B716-R678. Aortic aneurysm NOS (ICD10-I71.9) CTA head: 1. A suboptimal intracranial contrast bolus limits evaluation. 2. No  intracranial large vessel occlusion. 3. Intracranial atherosclerotic disease with multifocal stenoses, most notably as follows. 4. Moderate to moderately severe stenosis of the cavernous right internal carotid artery. 5. Moderate stenosis at the origin of a superior division M2 left MCA branch. 6. Moderate focal stenosis within a superior division mid to distal M2 left MCA branch. 7. Multifocal stenoses within a proximal to mid inferior division left M2 MCA branch with sites of up to severe stenosis. 8. Multifocal moderate/severe stenoses within mid to distal superior division right M2 MCA branches. 9. Sites of apparent high-grade stenosis within the A2/A3 left anterior cerebral artery. 10. Moderate stenosis within the V4 right vertebral artery. 11. Apparent high-grade stenosis within the P2 left PCA. 12. Some of the above described stenoses may be accentuated on the current examination due to the suboptimal contrast bolus. Electronically Signed   By: Kellie Simmering DO   On: 08/03/2020 17:59   CT ANGIO NECK W OR WO CONTRAST  Result Date: 08/03/2020 CLINICAL DATA:  Stroke/TIA, assess intracranial arteries. Right-sided weakness. EXAM: CT ANGIOGRAPHY HEAD AND NECK TECHNIQUE: Multidetector CT imaging of the head and neck was performed using the standard protocol during bolus administration of intravenous contrast. Multiplanar CT image reconstructions and MIPs were obtained to evaluate the vascular anatomy. Carotid stenosis measurements (when applicable) are obtained utilizing NASCET criteria, using the distal internal carotid diameter as the denominator. CONTRAST:  54mL OMNIPAQUE IOHEXOL 350 MG/ML SOLN COMPARISON:  Noncontrast head CT 08/03/2020, CT angiogram head/neck 01/28/2019 FINDINGS: CTA NECK FINDINGS Aortic arch: Ascending thoracic aortic aneurysm measuring up to 4.4 cm in diameter. Atherosclerotic plaque within the visualized aortic arch and proximal major branch vessels of the neck. No hemodynamically  significant innominate or proximal subclavian artery stenosis. Right carotid system: CCA and ICA patent within the neck without significant stenosis (50% or greater). Mild atherosclerotic plaque within the carotid bifurcation. Left carotid system: Streak artifact from a dense left-sided contrast bolus limits evaluation of the CCA. Within this limitation,  the CCA and ICA are patent within the neck without appreciable hemodynamically significant stenosis. Mild calcified plaque within the carotid bifurcation. Vertebral arteries: Streak artifact from a dense left-sided contrast bolus limits evaluation of the left vertebral artery origin and V1 segment. Within this limitation, the vertebral arteries are patent within the neck without appreciable hemodynamically significant stenosis. Skeleton: No acute bony abnormality or aggressive osseous lesion. Cervical spondylosis. Other neck: No cervical lymphadenopathy.  Thyroid unremarkable. Upper chest: No consolidation within the imaged lung apices. Review of the MIP images confirms the above findings CTA HEAD FINDINGS Anterior circulation: Evaluation of the intracranial arterial circulation is limited due to a suboptimal contrast bolus. The intracranial internal carotid arteries are patent. Calcified plaque within both vessels. Redemonstrated moderate to moderately severe stenosis of the cavernous right ICA. The M1 middle cerebral arteries are patent without significant stenosis. No M2 proximal branch occlusion is identified. Atherosclerotic irregularity of the M2 and more distal MCA branches bilaterally, most notably as follows. Moderate stenosis at the origin of a superior division M2 left MCA branch (series 8, image 24). Moderate focal stenosis within a superior division mid to distal M2 left MCA branch (series 9, image 36). Additionally, there are multifocal stenoses within a proximal to mid inferior division left M2 MCA branch with sites of up to severe stenosis (series 9,  image 37). Multifocal moderate/severe stenoses within mid to distal division right M2 MCA branches. The anterior cerebral arteries are patent bilaterally. Atherosclerotic irregularity of both vessels. Additionally, there are apparent multifocal high-grade stenoses within the A2/A3 left anterior cerebral artery. No intracranial aneurysm is identified. Posterior circulation: The intracranial vertebral arteries are patent. Calcified plaque within both vessels. Redemonstrated moderate/severe stenosis within the V4 right vertebral artery. Mild stenosis of the V4 left vertebral artery. The basilar artery is patent. The posterior cerebral arteries are patent bilaterally. Atherosclerotic irregularity of both vessels. Most notably, there is a high-grade stenosis within the P2 left posterior cerebral artery (series 9, image 35). Posterior cerebral arteries are hypoplastic or absent bilaterally. Venous sinuses: Poorly assessed due to contrast bolus timing. Anatomic variants: As described Review of the MIP images confirms the above findings No evidence of emergent large vessel occlusion. These results were communicated to DR. XU At 5:59 pmon 8/9/2021by text page via the [facility] messaging system. IMPRESSION: CTA neck: 1. Streak artifact from a dense left-sided contrast bolus limits evaluation of the CCA and the left vertebral artery origin and V1 segment. Within this limitation, the bilateral common carotid, internal carotid and vertebral arteries are patent within the neck without appreciable hemodynamically significant stenosis. 2. Ascending thoracic aortic aneurysm measuring 4.4 cm in diameter. Recommend annual imaging followup by CTA or MRA. This recommendation follows 2010 ACCF/AHA/AATS/ACR/ASA/SCA/SCAI/SIR/STS/SVM Guidelines for the Diagnosis and Management of Patients with Thoracic Aortic Disease. Circulation. 2010; 121: E993-Z169. Aortic aneurysm NOS (ICD10-I71.9) CTA head: 1. A suboptimal intracranial contrast bolus  limits evaluation. 2. No intracranial large vessel occlusion. 3. Intracranial atherosclerotic disease with multifocal stenoses, most notably as follows. 4. Moderate to moderately severe stenosis of the cavernous right internal carotid artery. 5. Moderate stenosis at the origin of a superior division M2 left MCA branch. 6. Moderate focal stenosis within a superior division mid to distal M2 left MCA branch. 7. Multifocal stenoses within a proximal to mid inferior division left M2 MCA branch with sites of up to severe stenosis. 8. Multifocal moderate/severe stenoses within mid to distal superior division right M2 MCA branches. 9. Sites of apparent high-grade stenosis within the A2/A3  left anterior cerebral artery. 10. Moderate stenosis within the V4 right vertebral artery. 11. Apparent high-grade stenosis within the P2 left PCA. 12. Some of the above described stenoses may be accentuated on the current examination due to the suboptimal contrast bolus. Electronically Signed   By: Kellie Simmering DO   On: 08/03/2020 17:59   DG Chest Port 1 View  Result Date: 08/03/2020 CLINICAL DATA:  Code stroke, fever EXAM: PORTABLE CHEST 1 VIEW COMPARISON:  01/08/2020 FINDINGS: Cardiomegaly, tortuous/ectatic aorta. No confluent airspace opacities, effusions or edema. No acute bony abnormality. IMPRESSION: Cardiomegaly.  No active disease. Electronically Signed   By: Rolm Baptise M.D.   On: 08/03/2020 17:31   CT HEAD CODE STROKE WO CONTRAST  Result Date: 08/03/2020 CLINICAL DATA:  Code stroke. Neuro deficit, acute, stroke suspected. Additional history provided: Right-sided weakness. EXAM: CT HEAD WITHOUT CONTRAST TECHNIQUE: Contiguous axial images were obtained from the base of the skull through the vertex without intravenous contrast. COMPARISON:  Head CT 01/08/2020, brain MRI 01/28/2019. FINDINGS: Brain: Stable moderate to advanced generalized parenchymal atrophy. Redemonstrated chronic lacunar infarct within the left  thalamocapsular junction. As before, there is advanced patchy and confluent hypodensity within the cerebral white matter and pons which is nonspecific, but consistent with chronic small vessel ischemic disease. There is no acute intracranial hemorrhage. No acute demarcated cortical infarct is identified. No extra-axial fluid collection. No evidence of intracranial mass. No midline shift. Vascular: No hyperdense vessel.  Atherosclerotic calcifications Skull: Normal. Negative for fracture or focal lesion. Sinuses/Orbits: Visualized orbits show no acute finding. No significant paranasal sinus disease or mastoid effusion at the imaged levels. ASPECTS ([place] Stroke Program Early CT Score) - Ganglionic level infarction (caudate, lentiform nuclei, internal capsule, insula, M1-M3 cortex): 7 - Supraganglionic infarction (M4-M6 cortex): 3 Total score (0-10 with 10 being normal): 10 Other: 1.6 cm anterior right scalp lipoma (series 3, image 14). Stroke code: [name]. No evidence of acute intracranial abnormality on non-contrast head CT. Kellie Simmering) [phone]). IMPRESSION: No CT evidence of acute intracranial abnormality. 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 Stable moderate/advanced generalized parenchymal atrophy and advanced chronic small vessel ischemic disease. Electronically Signed   By: Kellie Simmering DO   On: 08/03/2020 16:59    Chart has been reviewed    Assessment/Plan  69 y.o. male with medical history significant of CVA, Intellectual disability Prostate CA, HTN, Peg placement, Covid positive in January 2021, Prostate cancer  Admitted for acute encephalopathy and right side weakness.  Present on Admission: . Acute metabolic encephalopathy - Vs TIA/CVA -appreciate neurology consult, obtain MRI.  Continue neurochecks PT OT and speech pathology evaluation. On aspirin Check for any evidence of infection family feels that patient has a's episode where he  has UTIs will check Chest x-ray unremarkable Allow permissive hypertension   - most likely multifactorial secondary to combination of   infection   mild dehydration secondary to decreased by mouth intake,  polypharmacy   - Will rehydrate   - treat underlining infection   - Hold contributing medications   - if no improvement may need further imaging to evaluate for CNS pathology pathology such as MRI of the brain attempted to do at night but patient did not tolerate will reattempt in the morning  - neurological exam appears to be nonfocal but patient unable to cooperate fully per nursing staff at the facility patient was leaning to the right and had right-sided weakness.  - VBG unremarkable no evidence of hypercarbia    -  no history of liver disease ammonia 31   UTI -may explain symptoms this patient have had in the past altered mental status with UTIs.  Right-sided weakness could be worsened in the setting of acute mental status changes. Continue to follow reevaluate neurological exam once patient is treated Treated with Rocephin await results of urine culture  . BPH (benign prostatic hyperplasia) -chronic stable continue home medications  . CKD (chronic kidney disease), stage III -chronic stable avoid nephrotoxic medications  Intellectual disability-chronic stable   Other plan as per orders.  DVT prophylaxis:  SCD       Code Status:    Code Status: Prior FULL CODE  as per family  I had personally discussed CODE STATUS with family    Family Communication:   Family   at  Bedside  plan of care was discussed on the phone with Niece Disposition Plan:                             Back to current facility when stable                               Following barriers for discharge:                                                        Will need to be able to tolerate PO                                                        Will need consultants to evaluate patient prior to discharge                       Would benefit from PT/OT eval prior to DC  Ordered                   Swallow eval - SLP ordered                                     Transition of care consulted                   Nutrition    consulted                                       Consults called:    Neurology aware  Admission status:  ED Disposition    ED Disposition Condition [place]: [facility] [100100]  Level of Care: Telemetry Medical [104]  I expect the patient will be discharged within 24 hours: No (not a candidate for 5C-Observation unit)  Covid Evaluation: Asymptomatic Screening Protocol (No Symptoms)  Diagnosis: Right sided weakness [932671]  Admitting Physician: Toy Baker [[id number]]  Attending Physician: Toy Baker [[id number]]      Obs   Level of care    tele  24H   Lab Results  Component Value Date   [place] NEGATIVE 08/03/2020     Precautions: admitted as Covid Negative      PPE: Used by the provider:   P100  eye Goggles,  Gloves     Zaray Gatchel 08/03/2020, 11:30 PM    [facility]     after 2 AM please page floor coverage PA If 7AM-7PM, please contact the day team taking care of the patient using [email or web address]   Patient was evaluated in the context of the global COVID-19 pandemic, which necessitated consideration that the patient might be at risk for infection with the SARS-CoV-2 virus that causes COVID-19. Institutional protocols and algorithms that pertain to the evaluation of patients at risk for COVID-19 are in a state of rapid change based on information released by regulatory bodies including the CDC and federal and state organizations. These policies and algorithms were followed during the patient's care.

## 2020-08-03 NOTE — ED Notes (Signed)
Contacted lab to add on urine culture, but lab informed they will need a recollect with a grey top.

## 2020-08-03 NOTE — ED Triage Notes (Signed)
Pt bib ems from [facility]. Pt last seen at 1425, last observed speaking normally at 1200. 5 mg IM Versed given at 1615 for combative behavior. Pt nonverbal on arrival, not following commands.  BP 96/88  HR 111  96% RA 18LAC

## 2020-08-04 ENCOUNTER — Observation Stay (HOSPITAL_COMMUNITY): Payer: Medicare Other

## 2020-08-04 DIAGNOSIS — E870 Hyperosmolality and hypernatremia: Secondary | ICD-10-CM | POA: Diagnosis present

## 2020-08-04 DIAGNOSIS — I6529 Occlusion and stenosis of unspecified carotid artery: Secondary | ICD-10-CM | POA: Diagnosis present

## 2020-08-04 DIAGNOSIS — Z931 Gastrostomy status: Secondary | ICD-10-CM | POA: Diagnosis not present

## 2020-08-04 DIAGNOSIS — Z8616 Personal history of COVID-19: Secondary | ICD-10-CM | POA: Diagnosis not present

## 2020-08-04 DIAGNOSIS — Z823 Family history of stroke: Secondary | ICD-10-CM | POA: Diagnosis not present

## 2020-08-04 DIAGNOSIS — E860 Dehydration: Secondary | ICD-10-CM | POA: Diagnosis present

## 2020-08-04 DIAGNOSIS — Z87891 Personal history of nicotine dependence: Secondary | ICD-10-CM | POA: Diagnosis not present

## 2020-08-04 DIAGNOSIS — R29810 Facial weakness: Secondary | ICD-10-CM | POA: Diagnosis present

## 2020-08-04 DIAGNOSIS — Z79899 Other long term (current) drug therapy: Secondary | ICD-10-CM | POA: Diagnosis not present

## 2020-08-04 DIAGNOSIS — G9340 Encephalopathy, unspecified: Secondary | ICD-10-CM | POA: Diagnosis present

## 2020-08-04 DIAGNOSIS — Z20822 Contact with and (suspected) exposure to covid-19: Secondary | ICD-10-CM | POA: Diagnosis present

## 2020-08-04 DIAGNOSIS — R471 Dysarthria and anarthria: Secondary | ICD-10-CM | POA: Diagnosis present

## 2020-08-04 DIAGNOSIS — N390 Urinary tract infection, site not specified: Secondary | ICD-10-CM | POA: Diagnosis present

## 2020-08-04 DIAGNOSIS — Z993 Dependence on wheelchair: Secondary | ICD-10-CM | POA: Diagnosis not present

## 2020-08-04 DIAGNOSIS — N400 Enlarged prostate without lower urinary tract symptoms: Secondary | ICD-10-CM | POA: Diagnosis present

## 2020-08-04 DIAGNOSIS — E785 Hyperlipidemia, unspecified: Secondary | ICD-10-CM | POA: Diagnosis present

## 2020-08-04 DIAGNOSIS — Z8673 Personal history of transient ischemic attack (TIA), and cerebral infarction without residual deficits: Secondary | ICD-10-CM | POA: Diagnosis not present

## 2020-08-04 DIAGNOSIS — G9341 Metabolic encephalopathy: Principal | ICD-10-CM

## 2020-08-04 DIAGNOSIS — Z833 Family history of diabetes mellitus: Secondary | ICD-10-CM | POA: Diagnosis not present

## 2020-08-04 DIAGNOSIS — I1310 Hypertensive heart and chronic kidney disease without heart failure, with stage 1 through stage 4 chronic kidney disease, or unspecified chronic kidney disease: Secondary | ICD-10-CM | POA: Diagnosis present

## 2020-08-04 DIAGNOSIS — R569 Unspecified convulsions: Secondary | ICD-10-CM | POA: Diagnosis not present

## 2020-08-04 DIAGNOSIS — Z8546 Personal history of malignant neoplasm of prostate: Secondary | ICD-10-CM | POA: Diagnosis not present

## 2020-08-04 DIAGNOSIS — Z8249 Family history of ischemic heart disease and other diseases of the circulatory system: Secondary | ICD-10-CM | POA: Diagnosis not present

## 2020-08-04 DIAGNOSIS — I6389 Other cerebral infarction: Secondary | ICD-10-CM | POA: Diagnosis not present

## 2020-08-04 DIAGNOSIS — N1831 Chronic kidney disease, stage 3a: Secondary | ICD-10-CM | POA: Diagnosis present

## 2020-08-04 DIAGNOSIS — R4702 Dysphasia: Secondary | ICD-10-CM | POA: Diagnosis present

## 2020-08-04 DIAGNOSIS — F79 Unspecified intellectual disabilities: Secondary | ICD-10-CM | POA: Diagnosis present

## 2020-08-04 LAB — HEMOGLOBIN A1C
Hgb A1c MFr Bld: 5.3 % (ref 4.8–5.6)
Mean Plasma Glucose: 105.41 mg/dL

## 2020-08-04 LAB — LIPID PANEL
Cholesterol: 92 mg/dL (ref 0–200)
HDL: 47 mg/dL (ref >40)
LDL Cholesterol: 34 mg/dL (ref 0–99)
Total CHOL/HDL Ratio: 2 RATIO
Triglycerides: 56 mg/dL (ref <150)
VLDL: 11 mg/dL (ref 0–40)

## 2020-08-04 LAB — TROPONIN I (HIGH SENSITIVITY): Troponin I (High Sensitivity): 17 ng/L (ref <18)

## 2020-08-04 LAB — CBG MONITORING, ED: Glucose-Capillary: 94 mg/dL (ref 70–99)

## 2020-08-04 LAB — GLUCOSE, CAPILLARY
Glucose-Capillary: 87 mg/dL (ref 70–99)
Glucose-Capillary: 91 mg/dL (ref 70–99)

## 2020-08-04 LAB — URINE CULTURE

## 2020-08-04 MED ORDER — CHLORHEXIDINE GLUCONATE 0.12 % MT SOLN
15.0000 mL | Freq: Two times a day (BID) | OROMUCOSAL | Status: DC
  Administered 2020-08-05 – 2020-08-07 (×4): 15 mL via OROMUCOSAL
  Filled 2020-08-04 (×5): qty 15

## 2020-08-04 MED ORDER — GLUCERNA 1.2 CAL PO LIQD
20.0000 mL/h | ORAL | Status: DC
  Administered 2020-08-04: 20 mL/h
  Filled 2020-08-04 (×2): qty 237

## 2020-08-04 MED ORDER — ORAL CARE MOUTH RINSE
15.0000 mL | Freq: Two times a day (BID) | OROMUCOSAL | Status: DC
  Administered 2020-08-05 – 2020-08-06 (×4): 15 mL via OROMUCOSAL

## 2020-08-04 NOTE — Progress Notes (Signed)
  Echocardiogram 2D Echocardiogram has been attempted. Patient uncooperative. Will reattempt at later time.  Samuel Shelton 08/04/2020, 10:39 AM

## 2020-08-04 NOTE — ED Notes (Signed)
Noted patient PEG tube is occluded w/ a black stick, removed and replaced w/ appropriate 3 way stop connector. Flushed peg tube w/ water and free flowed to gravity w/ no issues.

## 2020-08-04 NOTE — ED Notes (Signed)
Pt incontinent of stool and urine. Pt cleaned and linens changed.

## 2020-08-04 NOTE — ED Notes (Signed)
Attempted to call report, unsuccessful. Callback number provide.

## 2020-08-04 NOTE — Progress Notes (Signed)
PROGRESS NOTE  Samuel Shelton  DOB: [birth year]  PCP: Sofie Hartigan, MD CBS:[phone]  DOA: 08/03/2020  LOS: 0 days   Chief Complaint  Patient presents with  . Code Stroke   Brief narrative: Samuel Shelton is a 69 y.o. male with PMH of hypertension, CVA 2020, PEG tube in situ, intellectual disability, prostate cancer, Covid positive in January 2021, wheelchair-bound at baseline Patient was brought to the ED on 08/03/2020 from a facility with complaint of right facial droop, drooling and inability to talk. Last CVA was 01/2019 s/p tPA at that time, he had no residual deficit. He was discharged on DAPT but currently not on it. He has PEG but ready to come out as he has been able to eat.  CT head did not show any acute abnormality. Not a TPA candidate for late presentation. Not an IR candidate due to poor baseline. MRI of the brain was attempted but patient did not tolerate.   CTA head and neck did not show any intracranial large vessel occlusion.  Showed moderate to severe stenosis of multiple vessels including cavernous right ICA, left MCA, right MCA, left ACA, right vertebral artery and left PCA.  Neurology consultation was obtained. Patient was admitted to hospitalist service for further evaluation and management.  Subjective: Patient was seen and examined this morning. Waiting for bed availability in the ED. Opens eyes on verbal command.  Nods head to any question or motor command. Not in pain. Unable to obtain MRI because of patient's agitation. Temperature 100.2 yesterday evening last night. Overnight, patient has remained mostly tachycardic between 100-110 Blood pressure mostly normal range to low normal, breathing comfortably room air LDL 34, HDL 47 A1c 5.3, Urinalysis with cloudy amber urine, large amount of leukocytes and many bacteria.  Assessment/Plan: Acute neurological changes -Presented with right-sided facial weakness, drooling, inability to talk -Being  worked up for possible CVA -CT head did not show any acute infarct.  Unable to obtain MRI because of patient not being cooperative. Wondering if repeat CT head would be useful.  Defer to neurology. -Neurology consultation obtained. -LDL 34, HDL 47, A1c 5.3, -Pending echocardiogram. -Pending PT/OT/ST eval. -Aspirin 300 mg per rectally was given in the ED. -Continue to monitor in telemetry.   -Continue frequent neuro checks. -Allow permissive hypertension.  Acute metabolic encephalopathy -On presentation also had disorientation, combativeness which is not his baseline -?  Cause : CVA versus infection versus polypharmacy.  Previously with normal PCO2, ammonia level normal. -Chest x-ray unremarkable. -Urinalysis with cloudy amber urine, large amount of leukocytes and many bacteria. -Patient had temperature of 100.2 in 2 occasions in last 24 hours. -Continue IV Rocephin.  Pending urine culture.  Intracranial vascular stenosis -CTA head and neck did not show any intracranial large vessel occlusion.  Showed moderate to severe stenosis of multiple vessels including cavernous right ICA, left MCA, right MCA, left ACA, right vertebral artery and left PCA. -We will discuss with neurology  Dysphagia -Patient has history of dysphagia and has PEG tube in place.  Per report his oral appetite had improved.  With new neurological changes, patient seems to be dysphasic again.  Speech therapy to eval.  BPH (benign prostatic hyperplasia) -Continue Flomax and Proscar.  CKD 3a -Stable creatinine   Intellectual disability-chronic stable   Mobility: wheelchair bound at baseline.  PT/OT eval pending Code Status:   Code Status: Full Code  Nutritional status: Body mass index is 23.47 kg/m.     Diet Order  Diet NPO time specified  Diet effective now                 DVT prophylaxis: SCD's Start: 08/03/20 2053   Antimicrobials:  IV Rocephin Fluid: None  Consultants:  Neurology Family Communication:  Not at bedside  Status is: Observation  The patient will require care spanning > 2 midnights and should be moved to inpatient because: Altered mental status, Ongoing diagnostic testing needed not appropriate for outpatient work up and IV treatments appropriate due to intensity of illness or inability to take PO  Dispo: The patient is from: Nursing home              Anticipated d/c is to: Back to nursing home              Anticipated d/c date is: 3 days              Patient currently is not medically stable to d/c.       Infusions:  . sodium chloride Stopped (08/03/20 2103)  . sodium chloride Stopped (08/04/20 0705)  . cefTRIAXone (ROCEPHIN)  IV      Scheduled Meds: .  stroke: mapping our early stages of recovery book   Does not apply Once  . aspirin  300 mg Rectal Daily   Or  . aspirin  325 mg Oral Daily  . finasteride  5 mg Oral Daily  . pantoprazole  40 mg Oral Daily    Antimicrobials: Anti-infectives (From admission, onward)   Start     Dose/Rate Route Frequency Ordered Stop   08/04/20 1800  cefTRIAXone (ROCEPHIN) 1 g in sodium chloride 0.9 % 100 mL IVPB     Discontinue     1 g 200 mL/hr over 30 Minutes Intravenous Every 24 hours 08/03/20 2326     08/03/20 1800  cefTRIAXone (ROCEPHIN) 1 g in sodium chloride 0.9 % 100 mL IVPB        1 g 200 mL/hr over 30 Minutes Intravenous  Once 08/03/20 1746 08/03/20 1843      PRN meds: acetaminophen **OR** acetaminophen (TYLENOL) oral liquid 160 mg/5 mL **OR** acetaminophen   Objective: Vitals:   08/04/20 0604 08/04/20 0720  BP:  (!) 141/78  Pulse:  (!) 108  Resp:  18  Temp: 99.2 F (37.3 C)   SpO2:  99%    Intake/Output Summary (Last 24 hours) at 08/04/2020 0752 Last data filed at 08/03/2020 2103 Gross per 24 hour  Intake 231.25 ml  Output --  Net 231.25 ml   Filed Weights   08/03/20 1718  Weight: 78.5 kg   Weight change:  Body mass index is 23.47 kg/m.   Physical  Exam: General exam: Appears calm and comfortable.  Not in physical distress Skin: No rashes, lesions or ulcers. HEENT: Atraumatic, normocephalic, supple neck, no obvious bleeding Lungs: Clear to auscultation bilaterally CVS: Regular rate and rhythm, no murmur GI/Abd soft, nontender, nondistended, bowel sound present CNS: Opens eyes on verbal command, nods head on any question or motor command Psychiatry: Mood appropriate Extremities: No pedal edema, no calf tenderness  Data Review: I have personally reviewed the laboratory data and studies available.  Recent Labs  Lab 08/03/20 1641 08/03/20 1648 08/03/20 2108  WBC 9.1  --   --   NEUTROABS 7.3  --   --   HGB 10.7* 11.9* 12.6*  HCT 36.1* 35.0* 37.0*  MCV 90.0  --   --   PLT 335  --   --  Recent Labs  Lab 08/03/20 1641 08/03/20 1648 08/03/20 2108  NA 144 146* 146*  K 3.8 3.6 4.6  CL 108 110  --   CO2 24  --   --   GLUCOSE 102* 97  --   BUN 34* 33*  --   CREATININE 1.51* 1.40*  --   CALCIUM 9.1  --   --    Lab Results  Component Value Date   HGBA1C 5.3 08/04/2020       Component Value Date/Time   CHOL 92 08/04/2020 0130   TRIG 56 08/04/2020 0130   HDL 47 08/04/2020 0130   CHOLHDL 2.0 08/04/2020 0130   VLDL 11 08/04/2020 0130   LDLCALC 34 08/04/2020 0130   Signed, Terrilee Croak, MD [facility] Pager: [phone] (Secure Chat preferred). 08/04/2020

## 2020-08-04 NOTE — Procedures (Signed)
Patient Name: Samuel Shelton  MRN: [id number]  Epilepsy Attending: Lora Havens  Referring Physician/Provider: Dr. Rosalin Hawking Date: 08/03/2020 Duration: 27.50 mins  Patient history: 69 year old male with history of seizure like episodes who presented with right-sided facial droop, leaning towards the right, drooling and being nonverbal.  EEG to evaluate for seizures.  Level of alertness: Awake  AEDs during EEG study: None  Technical aspects: This EEG study was done with scalp electrodes positioned according to the 10-20 International system of electrode placement. Electrical activity was acquired at a sampling rate of 500Hz  and reviewed with a high frequency filter of 70Hz  and a low frequency filter of 1Hz . EEG data were recorded continuously and digitally stored.   Description: No posterior dominant rhythm was seen.  EEG showed continuous generalized predominantly 2 to 3 Hz delta slowing as well as intermittent 5 to 6 Hz theta slowing.  Hyperventilation and photic stimulation were not performed.     Of note, EEG was technically difficult due to significant movement artifact.  ABNORMALITY -Continuous slow, generalized  IMPRESSION: This technically difficult study is suggestive of moderate diffuse encephalopathy, nonspecific to etiology.  No seizures or epileptiform discharges were seen throughout the recording.   Jeena Arnett Barbra Sarks

## 2020-08-04 NOTE — Progress Notes (Addendum)
STROKE TEAM PROGRESS NOTE   INTERVAL HISTORY No one at bedside. Patient in bed, not following commands. Unable to tolerate MRI. Will repeat CTH.  Vitals:   08/04/20 0500 08/04/20 0604 08/04/20 0604 08/04/20 0720  BP: (!) 135/97 99/72  (!) 141/78  Pulse: 99 (!) 102  (!) 108  Resp: (!) 22 (!) 21  18  Temp:   99.2 F (37.3 C)   TempSrc:   Rectal   SpO2: 96% 97%  99%  Weight:      Height:       CBC:  Recent Labs  Lab 08/03/20 1641 08/03/20 1641 08/03/20 1648 08/03/20 2108  WBC 9.1  --   --   --   NEUTROABS 7.3  --   --   --   HGB 10.7*   < > 11.9* 12.6*  HCT 36.1*   < > 35.0* 37.0*  MCV 90.0  --   --   --   PLT 335  --   --   --    < > = values in this interval not displayed.   Basic Metabolic Panel:  Recent Labs  Lab 08/03/20 1641 08/03/20 1641 08/03/20 1648 08/03/20 2108  NA 144   < > 146* 146*  K 3.8   < > 3.6 4.6  CL 108  --  110  --   CO2 24  --   --   --   GLUCOSE 102*  --  97  --   BUN 34*  --  33*  --   CREATININE 1.51*  --  1.40*  --   CALCIUM 9.1  --   --   --    < > = values in this interval not displayed.   Lipid Panel:  Recent Labs  Lab 08/04/20 0130  CHOL 92  TRIG 56  HDL 47  CHOLHDL 2.0  VLDL 11  LDLCALC 34   HgbA1c:  Recent Labs  Lab 08/04/20 0130  HGBA1C 5.3   Urine Drug Screen:  Recent Labs  Lab 08/03/20 2204  LABOPIA NONE DETECTED  COCAINSCRNUR NONE DETECTED  LABBENZ POSITIVE*  AMPHETMU NONE DETECTED  THCU NONE DETECTED  LABBARB NONE DETECTED    Alcohol Level  Recent Labs  Lab 08/03/20 1641  ETH <10    IMAGING past 24 hours CT ANGIO HEAD W OR WO CONTRAST  Result Date: 08/03/2020 CLINICAL DATA:  Stroke/TIA, assess intracranial arteries. Right-sided weakness. EXAM: CT ANGIOGRAPHY HEAD AND NECK TECHNIQUE: Multidetector CT imaging of the head and neck was performed using the standard protocol during bolus administration of intravenous contrast. Multiplanar CT image reconstructions and MIPs were obtained to evaluate the  vascular anatomy. Carotid stenosis measurements (when applicable) are obtained utilizing NASCET criteria, using the distal internal carotid diameter as the denominator. CONTRAST:  63mL OMNIPAQUE IOHEXOL 350 MG/ML SOLN COMPARISON:  Noncontrast head CT 08/03/2020, CT angiogram head/neck 01/28/2019 FINDINGS: CTA NECK FINDINGS Aortic arch: Ascending thoracic aortic aneurysm measuring up to 4.4 cm in diameter. Atherosclerotic plaque within the visualized aortic arch and proximal major branch vessels of the neck. No hemodynamically significant innominate or proximal subclavian artery stenosis. Right carotid system: CCA and ICA patent within the neck without significant stenosis (50% or greater). Mild atherosclerotic plaque within the carotid bifurcation. Left carotid system: Streak artifact from a dense left-sided contrast bolus limits evaluation of the CCA. Within this limitation, the CCA and ICA are patent within the neck without appreciable hemodynamically significant stenosis. Mild calcified plaque within the carotid bifurcation. Vertebral  arteries: Streak artifact from a dense left-sided contrast bolus limits evaluation of the left vertebral artery origin and V1 segment. Within this limitation, the vertebral arteries are patent within the neck without appreciable hemodynamically significant stenosis. Skeleton: No acute bony abnormality or aggressive osseous lesion. Cervical spondylosis. Other neck: No cervical lymphadenopathy.  Thyroid unremarkable. Upper chest: No consolidation within the imaged lung apices. Review of the MIP images confirms the above findings CTA HEAD FINDINGS Anterior circulation: Evaluation of the intracranial arterial circulation is limited due to a suboptimal contrast bolus. The intracranial internal carotid arteries are patent. Calcified plaque within both vessels. Redemonstrated moderate to moderately severe stenosis of the cavernous right ICA. The M1 middle cerebral arteries are patent  without significant stenosis. No M2 proximal branch occlusion is identified. Atherosclerotic irregularity of the M2 and more distal MCA branches bilaterally, most notably as follows. Moderate stenosis at the origin of a superior division M2 left MCA branch (series 8, image 24). Moderate focal stenosis within a superior division mid to distal M2 left MCA branch (series 9, image 36). Additionally, there are multifocal stenoses within a proximal to mid inferior division left M2 MCA branch with sites of up to severe stenosis (series 9, image 37). Multifocal moderate/severe stenoses within mid to distal division right M2 MCA branches. The anterior cerebral arteries are patent bilaterally. Atherosclerotic irregularity of both vessels. Additionally, there are apparent multifocal high-grade stenoses within the A2/A3 left anterior cerebral artery. No intracranial aneurysm is identified. Posterior circulation: The intracranial vertebral arteries are patent. Calcified plaque within both vessels. Redemonstrated moderate/severe stenosis within the V4 right vertebral artery. Mild stenosis of the V4 left vertebral artery. The basilar artery is patent. The posterior cerebral arteries are patent bilaterally. Atherosclerotic irregularity of both vessels. Most notably, there is a high-grade stenosis within the P2 left posterior cerebral artery (series 9, image 35). Posterior cerebral arteries are hypoplastic or absent bilaterally. Venous sinuses: Poorly assessed due to contrast bolus timing. Anatomic variants: As described Review of the MIP images confirms the above findings No evidence of emergent large vessel occlusion. These results were communicated to DR. Ekam Bonebrake At 5:59 pmon 8/9/2021by text page via the [facility] messaging system. IMPRESSION: CTA neck: 1. Streak artifact from a dense left-sided contrast bolus limits evaluation of the CCA and the left vertebral artery origin and V1 segment. Within this limitation, the bilateral common  carotid, internal carotid and vertebral arteries are patent within the neck without appreciable hemodynamically significant stenosis. 2. Ascending thoracic aortic aneurysm measuring 4.4 cm in diameter. Recommend annual imaging followup by CTA or MRA. This recommendation follows 2010 ACCF/AHA/AATS/ACR/ASA/SCA/SCAI/SIR/STS/SVM Guidelines for the Diagnosis and Management of Patients with Thoracic Aortic Disease. Circulation. 2010; 121: K998-P382. Aortic aneurysm NOS (ICD10-I71.9) CTA head: 1. A suboptimal intracranial contrast bolus limits evaluation. 2. No intracranial large vessel occlusion. 3. Intracranial atherosclerotic disease with multifocal stenoses, most notably as follows. 4. Moderate to moderately severe stenosis of the cavernous right internal carotid artery. 5. Moderate stenosis at the origin of a superior division M2 left MCA branch. 6. Moderate focal stenosis within a superior division mid to distal M2 left MCA branch. 7. Multifocal stenoses within a proximal to mid inferior division left M2 MCA branch with sites of up to severe stenosis. 8. Multifocal moderate/severe stenoses within mid to distal superior division right M2 MCA branches. 9. Sites of apparent high-grade stenosis within the A2/A3 left anterior cerebral artery. 10. Moderate stenosis within the V4 right vertebral artery. 11. Apparent high-grade stenosis within the P2 left PCA.  12. Some of the above described stenoses may be accentuated on the current examination due to the suboptimal contrast bolus. Electronically Signed   By: Kellie Simmering DO   On: 08/03/2020 17:59   CT ANGIO NECK W OR WO CONTRAST  Result Date: 08/03/2020 CLINICAL DATA:  Stroke/TIA, assess intracranial arteries. Right-sided weakness. EXAM: CT ANGIOGRAPHY HEAD AND NECK TECHNIQUE: Multidetector CT imaging of the head and neck was performed using the standard protocol during bolus administration of intravenous contrast. Multiplanar CT image reconstructions and MIPs were  obtained to evaluate the vascular anatomy. Carotid stenosis measurements (when applicable) are obtained utilizing NASCET criteria, using the distal internal carotid diameter as the denominator. CONTRAST:  30mL OMNIPAQUE IOHEXOL 350 MG/ML SOLN COMPARISON:  Noncontrast head CT 08/03/2020, CT angiogram head/neck 01/28/2019 FINDINGS: CTA NECK FINDINGS Aortic arch: Ascending thoracic aortic aneurysm measuring up to 4.4 cm in diameter. Atherosclerotic plaque within the visualized aortic arch and proximal major branch vessels of the neck. No hemodynamically significant innominate or proximal subclavian artery stenosis. Right carotid system: CCA and ICA patent within the neck without significant stenosis (50% or greater). Mild atherosclerotic plaque within the carotid bifurcation. Left carotid system: Streak artifact from a dense left-sided contrast bolus limits evaluation of the CCA. Within this limitation, the CCA and ICA are patent within the neck without appreciable hemodynamically significant stenosis. Mild calcified plaque within the carotid bifurcation. Vertebral arteries: Streak artifact from a dense left-sided contrast bolus limits evaluation of the left vertebral artery origin and V1 segment. Within this limitation, the vertebral arteries are patent within the neck without appreciable hemodynamically significant stenosis. Skeleton: No acute bony abnormality or aggressive osseous lesion. Cervical spondylosis. Other neck: No cervical lymphadenopathy.  Thyroid unremarkable. Upper chest: No consolidation within the imaged lung apices. Review of the MIP images confirms the above findings CTA HEAD FINDINGS Anterior circulation: Evaluation of the intracranial arterial circulation is limited due to a suboptimal contrast bolus. The intracranial internal carotid arteries are patent. Calcified plaque within both vessels. Redemonstrated moderate to moderately severe stenosis of the cavernous right ICA. The M1 middle cerebral  arteries are patent without significant stenosis. No M2 proximal branch occlusion is identified. Atherosclerotic irregularity of the M2 and more distal MCA branches bilaterally, most notably as follows. Moderate stenosis at the origin of a superior division M2 left MCA branch (series 8, image 24). Moderate focal stenosis within a superior division mid to distal M2 left MCA branch (series 9, image 36). Additionally, there are multifocal stenoses within a proximal to mid inferior division left M2 MCA branch with sites of up to severe stenosis (series 9, image 37). Multifocal moderate/severe stenoses within mid to distal division right M2 MCA branches. The anterior cerebral arteries are patent bilaterally. Atherosclerotic irregularity of both vessels. Additionally, there are apparent multifocal high-grade stenoses within the A2/A3 left anterior cerebral artery. No intracranial aneurysm is identified. Posterior circulation: The intracranial vertebral arteries are patent. Calcified plaque within both vessels. Redemonstrated moderate/severe stenosis within the V4 right vertebral artery. Mild stenosis of the V4 left vertebral artery. The basilar artery is patent. The posterior cerebral arteries are patent bilaterally. Atherosclerotic irregularity of both vessels. Most notably, there is a high-grade stenosis within the P2 left posterior cerebral artery (series 9, image 35). Posterior cerebral arteries are hypoplastic or absent bilaterally. Venous sinuses: Poorly assessed due to contrast bolus timing. Anatomic variants: As described Review of the MIP images confirms the above findings No evidence of emergent large vessel occlusion. These results were communicated to DR.  Jordany Russett At 5:59 pmon 8/9/2021by text page via the [facility] messaging system. IMPRESSION: CTA neck: 1. Streak artifact from a dense left-sided contrast bolus limits evaluation of the CCA and the left vertebral artery origin and V1 segment. Within this limitation, the  bilateral common carotid, internal carotid and vertebral arteries are patent within the neck without appreciable hemodynamically significant stenosis. 2. Ascending thoracic aortic aneurysm measuring 4.4 cm in diameter. Recommend annual imaging followup by CTA or MRA. This recommendation follows 2010 ACCF/AHA/AATS/ACR/ASA/SCA/SCAI/SIR/STS/SVM Guidelines for the Diagnosis and Management of Patients with Thoracic Aortic Disease. Circulation. 2010; 121: J188-C166. Aortic aneurysm NOS (ICD10-I71.9) CTA head: 1. A suboptimal intracranial contrast bolus limits evaluation. 2. No intracranial large vessel occlusion. 3. Intracranial atherosclerotic disease with multifocal stenoses, most notably as follows. 4. Moderate to moderately severe stenosis of the cavernous right internal carotid artery. 5. Moderate stenosis at the origin of a superior division M2 left MCA branch. 6. Moderate focal stenosis within a superior division mid to distal M2 left MCA branch. 7. Multifocal stenoses within a proximal to mid inferior division left M2 MCA branch with sites of up to severe stenosis. 8. Multifocal moderate/severe stenoses within mid to distal superior division right M2 MCA branches. 9. Sites of apparent high-grade stenosis within the A2/A3 left anterior cerebral artery. 10. Moderate stenosis within the V4 right vertebral artery. 11. Apparent high-grade stenosis within the P2 left PCA. 12. Some of the above described stenoses may be accentuated on the current examination due to the suboptimal contrast bolus. Electronically Signed   By: Kellie Simmering DO   On: 08/03/2020 17:59   DG Chest Port 1 View  Result Date: 08/03/2020 CLINICAL DATA:  Code stroke, fever EXAM: PORTABLE CHEST 1 VIEW COMPARISON:  01/08/2020 FINDINGS: Cardiomegaly, tortuous/ectatic aorta. No confluent airspace opacities, effusions or edema. No acute bony abnormality. IMPRESSION: Cardiomegaly.  No active disease. Electronically Signed   By: Rolm Baptise M.D.   On:  08/03/2020 17:31   CT HEAD CODE STROKE WO CONTRAST  Result Date: 08/03/2020 CLINICAL DATA:  Code stroke. Neuro deficit, acute, stroke suspected. Additional history provided: Right-sided weakness. EXAM: CT HEAD WITHOUT CONTRAST TECHNIQUE: Contiguous axial images were obtained from the base of the skull through the vertex without intravenous contrast. COMPARISON:  Head CT 01/08/2020, brain MRI 01/28/2019. FINDINGS: Brain: Stable moderate to advanced generalized parenchymal atrophy. 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 As before, there is advanced patchy and confluent hypodensity within the cerebral white matter and pons which is nonspecific, but consistent with chronic small vessel ischemic disease. There is no acute intracranial hemorrhage. No acute demarcated cortical infarct is identified. No extra-axial fluid collection. No evidence of intracranial mass. No midline shift. Vascular: No hyperdense vessel.  Atherosclerotic calcifications Skull: Normal. Negative for fracture or focal lesion. Sinuses/Orbits: Visualized orbits show no acute finding. No significant paranasal sinus disease or mastoid effusion at the imaged levels. ASPECTS ([place] Stroke Program Early CT Score) - Ganglionic level infarction (caudate, lentiform nuclei, internal capsule, insula, M1-M3 cortex): 7 - Supraganglionic infarction (M4-M6 cortex): 3 Total score (0-10 with 10 being normal): 10 Other: 1.6 cm anterior right scalp lipoma (series 3, image 14). Stroke code: [name]. No evidence of acute intracranial abnormality on non-contrast head CT. Kellie Simmering) [phone]). IMPRESSION: No CT evidence of acute intracranial abnormality. 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 Stable moderate/advanced generalized parenchymal atrophy and advanced chronic small vessel ischemic disease. Electronically Signed   By: Kellie Simmering DO  On: 08/03/2020 16:59     PHYSICAL EXAM General: well nourished, NAD Respiratory: regular rate, no WOB Cardiovascular: pulses palpable Neuro exam: MS: awake,  Opens eyes to name calling and LT. Does not follow commands.  Not oriented. Said " ow" to noxious stimuli.   CN: eyes cross midline, PERRL, face appears symmetric Cerebellar: unable to test. No ataxia noted, but BUE tremors noted.  Motor/ sensory: BUE antigravity. All 4 extremities able to localize and withdraw to noxious stimuli.    ASSESSMENT/PLAN  Samuel Shelton is a 69 y.o. 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  As per EMS, pt lives in NH and at baseline wheelchair bound but able to talk. Last seen normal at noon when he was talking. However, at 14:25pm he was seen by staff with normal symmetrical face but did not talk. Later when staff checked him again, found him to have right facial droop, drooling, food coming out from his mouth on the right side, however, pt also kept leaning towards right. Pt was nonverbal at that time. EMS called. On arrival, pt was combative. Has to give 5mg  of versed, after that pt calmed down. Glucose 114, BP 100/67, HR 110. In ER, pt eyes open but not responding to commands or questions. Body leaning towards right and resistant of moving arm or legs. CT no acute abnormalities.  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 He had left BG/CR infarct in 01/2019 s/p tPA at that time. Seems to have no residue deficit. However, per note, he spoke words but no sentences. He was discharged with DAPT. However, no antiplatelets were in his medication list this time. MRI unable to obtain.  Repeat head CT is pending EEG was negative for seizures. UA is positive for UTI. Ammonia: WNL, blood cultures are pending. Urine culture is pending.    Stroke vs metabolic encephalopathy  With  worsening of previous stroke symptoms:   Code Stroke CT head No CT evidence of acute intracranial abnormality. ASPECTS is 10  (discounting a chronic left thalamocapsular junction infarct).  CTA head & neck  CTA neck:  1. Streak artifact from a dense left-sided contrast bolus limits evaluation of the CCA and the left vertebral artery origin and V1 segment. Within this limitation, the bilateral common carotid, internal carotid and vertebral arteries are patent within the neck without appreciable hemodynamically significant stenosis. 2. Ascending thoracic aortic aneurysm measuring 4.4 cm in diameter.   CTA head:  1. A suboptimal intracranial contrast bolus limits evaluation. 2. No intracranial large vessel occlusion. 3. Intracranial atherosclerotic disease with multifocal stenoses, most notably as follows. 4. Moderate to moderately severe stenosis of the cavernous right internal carotid artery. 5. Moderate stenosis at the origin of a superior division M2 left MCA branch. 6. Moderate focal stenosis within a superior division mid to distal M2 left MCA branch. 7. Multifocal stenoses within a proximal to mid inferior division left M2 MCA branch with sites of up to severe stenosis. 8. Multifocal moderate/severe stenoses within mid to distal superior division right M2 MCA branches. 9. Sites of apparent high-grade stenosis within the A2/A3 left anterior cerebral artery. 10. Moderate stenosis within the V4 right vertebral artery. 11. Apparent high-grade stenosis within the P2 left PCA. 12. Some of the above described stenoses may be accentuated on the current examination due to the suboptimal contrast bolus.  2D Echo pending  LDL 34  HgbA1c 5.3  VTE prophylaxis - SCD's    Diet   Diet NPO time specified     No antithrombotic prior to admission, now on aspirin 300 mg suppository daily.   Therapy recommendations:  pending  Disposition:  pending  Hypertension  Home  meds:  none  Stable . Permissive hypertension (OK if < 220/120) but gradually normalize in 5-7 days . Long-term BP goal normotensive  Hyperlipidemia  Home meds:  none,   LDL 34, goal < 70   Diabetes type II Controlled  Home meds:  none  HgbA1c 5.3, goal < 7.0  CBGs Recent Labs    08/03/20 1638  GLUCAP 96     Other Stroke Risk Factors  Hx stroke/TIA  Other Active Problems  UTI- treatment per primary team  Seizure eval: EEG IMPRESSION:  This technically difficult study is suggestive of moderate diffuse encephalopathy, nonspecific to etiology.  No seizures or epileptiform discharges were seen throughout the recording.  Hospital day # 0  Laurey Morale, MSN, NP-C [facility] [phone]  ATTENDING NOTE: I reviewed above note and agree with the assessment and plan. Pt was seen and examined.   Patient niece at bedside.  Patient lying in bed, awake, alert, interactive, smiling to provider, able to tell me his name but perseverated on his name, not able to answer other orientation questions.  Moderate dysarthria, able to speak words but no sentences.  Patient still on the right side-lying position, however moving all extremities symmetrically.  As per niece, patient is at his baseline.  Patient not able to tolerating MRI, repeat CT showed no acute abnormality.  UA showed WBC 21-50, many bacteria and large leukocytes but negative for nitrate.  Urine culture pending.  Currently on Rocephin.  Sodium 146 and creatinine 1.51, concerning for dehydration.  Currently on IV fluid.  LDL 34 and A1c 5.3.  2D echo pending.  CTA head and neck unremarkable.  EEG no seizure.  Patient presentation more consistent with encephalopathy or post ictal state, could be triggered by UTI and dehydration.  Although MRI cannot be completed, stroke is less likely.  Given her improvement, CNS infection also unlikely.  Given his history of a stroke, recommend continue aspirin on  discharge.  No statin needed at this time given low LDL levels.   Neurology will sign off. Please call with questions. Pt will follow up with stroke clinic NP at [facility] in about 4 weeks. Thanks for the consult.   Rosalin Hawking, MD PhD Stroke Neurology 08/04/2020 10:58 PM     To contact Stroke Continuity provider, please refer to [email or web address] After hours, contact General Neurology

## 2020-08-04 NOTE — Progress Notes (Addendum)
CSW spoke with Broadus John at [facility] [phone]) regarding this patient - Broadus John states the patient is eligible to return at discharge.  Madilyn Fireman, MSW, LCSW-A Transitions of Care  Clinical Social Worker  [facility] Emergency Departments  Medical ICU [phone]

## 2020-08-04 NOTE — ED Notes (Signed)
EKG captured as ordered. RN gave EKG to Dr. Roxanne Mins EDP

## 2020-08-04 NOTE — Progress Notes (Signed)
OT Cancellation Note/Discharge  Patient Details Name: Samuel Shelton MRN: [id number] DOB: 10-Dec-1951   Cancelled Treatment:    Reason Eval/Treat Not Completed: Other (comment). At baseline pt lives in a snf and is wc bound. Assistance for ADL. Will defer any OT needs to SNF.   Mildred Tuccillo,HILLARY 08/04/2020, 6:39 AM  Maurie Boettcher, OT/L   Acute OT Clinical Specialist Acute Rehabilitation Services Pager [phone] Office [phone]

## 2020-08-04 NOTE — Progress Notes (Signed)
Pt is fairly dependent to move to side of bed, and is unable to tell PT if he was standing to get to wc previously.  He is nodding yes to some questions, seems appropriate and will continue to work on mobility to ease his transition home with better independent use of WC.  Follow with SNF rehab to screen as indicated.  08/04/20 1300  PT Visit Information  Last PT Received On 08/04/20  Assistance Needed +1  History of Present Illness 69 yo male with onset of R side weakness and is non verbal, unsure of the PLOF.  Has encephalopathy on EEG, cardiomegaly.  Has been in SNF previously in [facility] for mobility, not clear if pt was able to do standing transfers.  PMHx:  atherosclerosis, Covid 65, HTN, peg tube, intellectual disability, CVA, prostate CA,   Precautions  Precautions Fall  Precaution Comments non verbal  Restrictions  Weight Bearing Restrictions No  Home Living  Family/patient expects to be discharged to: Skilled nursing facility  Additional Comments wc level mobility previously  Prior Function  Level of Independence Needs assistance  Gait / Transfers Assistance Needed wc for mobility, pt cannot describe his ability to move the chair  ADL's / Homemaking Assistance Needed has staff assistance for all his care  Communication / Swallowing Assistance Needed non verbal  Communication  Communication Expressive difficulties  Pain Assessment  Pain Assessment Faces  Faces Pain Scale 0  Cognition  Arousal/Alertness Awake/alert;Lethargic  Behavior During Therapy Flat affect  Overall Cognitive Status History of cognitive impairments - at baseline  Upper Extremity Assessment  Upper Extremity Assessment Generalized weakness  Lower Extremity Assessment  Lower Extremity Assessment Generalized weakness  Cervical / Trunk Assessment  Cervical / Trunk Assessment Kyphotic  Bed Mobility  Overal bed mobility Needs Assistance  Bed Mobility Supine to Sit;Sit to Supine  Supine to sit Total assist  Sit to  supine Total assist  General bed mobility comments pt can help control sit balance a bit but cannot initiate any movement  Transfers  Overall transfer level Needs assistance  General transfer comment pt is too contracted in knees and hips to safely transfer from standing posture, may be a sliding board transfers  Ambulation/Gait  General Gait Details non ambulatory  Balance  Overall balance assessment Needs assistance  Sitting-balance support Feet unsupported  Sitting balance-Leahy Scale Poor  General Comments  General comments (skin integrity, edema, etc.) pt was up to side of bed with HR elevated to 125 due to stress and effort  PT - End of Session  Activity Tolerance Patient limited by lethargy;Treatment limited secondary to medical complications (Comment)  Patient left in bed;with call bell/phone within reach  Nurse Communication Mobility status  PT Assessment  PT Recommendation/Assessment Patient needs continued PT services  PT Visit Diagnosis Muscle weakness (generalized) (M62.81);Other abnormalities of gait and mobility (R26.89);Hemiplegia and hemiparesis;Adult, failure to thrive (R62.7)  Hemiplegia - Right/Left Right  Hemiplegia - dominant/non-dominant Dominant  Hemiplegia - caused by Unspecified  PT Problem List Decreased strength;Decreased range of motion;Decreased balance;Decreased activity tolerance;Decreased coordination;Decreased mobility;Decreased cognition;Decreased safety awareness  Barriers to Discharge Comments has around the clock care in home setting  PT Plan  PT Frequency (ACUTE ONLY) Min 2X/week  PT Treatment/Interventions (ACUTE ONLY) DME instruction;Functional mobility training;Therapeutic activities;Therapeutic exercise;Balance training;Neuromuscular re-education;Patient/family education  AM-PAC PT "6 Clicks" Mobility Outcome Measure (Version 2)  Help needed turning from your back to your side while in a flat bed without using bedrails? 2  Help needed moving  from  lying on your back to sitting on the side of a flat bed without using bedrails? 1  Help needed moving to and from a bed to a chair (including a wheelchair)? 1  Help needed standing up from a chair using your arms (e.g., wheelchair or bedside chair)? 1  Help needed to walk in hospital room? 1  Help needed climbing 3-5 steps with a railing?  1  6 Click Score 7  Consider Recommendation of Discharge To: CIR/SNF/LTACH  PT Recommendation  Follow Up Recommendations SNF  PT equipment None recommended by PT  Individuals Consulted  Consulted and Agree with Results and Recommendations Patient unable/family or caregiver not available  Acute Rehab PT Goals  Patient Stated Goal none stated, non verbal  PT Goal Formulation Patient unable to participate in goal setting  Time For Goal Achievement 08/18/20  Potential to Achieve Goals Fair  PT Time Calculation  PT Start Time (ACUTE ONLY) 1240  PT Stop Time (ACUTE ONLY) 1256  PT Time Calculation (min) (ACUTE ONLY) 16 min  PT General Charges  $$ ACUTE PT VISIT 1 Visit  PT Evaluation  $PT Eval Moderate Complexity 1 Mod  Written Expression  Dominant Hand Right    Mee Hives, PT MS Acute Rehab Dept. Number: [place]

## 2020-08-04 NOTE — ED Notes (Signed)
Patient transported to CT 

## 2020-08-04 NOTE — ED Notes (Signed)
Pt transferred onto hospital bed. Pt cleaned and linens changed.

## 2020-08-05 ENCOUNTER — Inpatient Hospital Stay (HOSPITAL_COMMUNITY): Payer: Medicare Other

## 2020-08-05 DIAGNOSIS — I6389 Other cerebral infarction: Secondary | ICD-10-CM

## 2020-08-05 DIAGNOSIS — N390 Urinary tract infection, site not specified: Secondary | ICD-10-CM | POA: Diagnosis not present

## 2020-08-05 LAB — MRSA PCR SCREENING: MRSA by PCR: POSITIVE — AB

## 2020-08-05 LAB — ECHOCARDIOGRAM LIMITED
Height: 72 in
S' Lateral: 2.7 cm
Weight: 2768.98 oz

## 2020-08-05 LAB — GLUCOSE, CAPILLARY
Glucose-Capillary: 89 mg/dL (ref 70–99)
Glucose-Capillary: 87 mg/dL (ref 70–99)
Glucose-Capillary: 87 mg/dL (ref 70–99)
Glucose-Capillary: 94 mg/dL (ref 70–99)
Glucose-Capillary: 90 mg/dL (ref 70–99)

## 2020-08-05 MED ORDER — MUPIROCIN 2 % EX OINT
1.0000 "application " | TOPICAL_OINTMENT | Freq: Two times a day (BID) | CUTANEOUS | Status: DC
  Administered 2020-08-06: 1 via NASAL
  Filled 2020-08-05: qty 22

## 2020-08-05 MED ORDER — CHLORHEXIDINE GLUCONATE CLOTH 2 % EX PADS
6.0000 | MEDICATED_PAD | Freq: Every day | CUTANEOUS | Status: DC
  Administered 2020-08-07: 6 via TOPICAL

## 2020-08-05 NOTE — Evaluation (Signed)
Clinical/Bedside Swallow Evaluation Patient Details  Name: Samuel Shelton MRN: [id number] Date of Birth: 07/28/1951  Today's Date: 08/05/2020 Time: SLP Start Time (ACUTE ONLY): 77 SLP Stop Time (ACUTE ONLY): 0935 SLP Time Calculation (min) (ACUTE ONLY): 15 min  Past Medical History:  Past Medical History:  Diagnosis Date  . Chronic mental illness   . Edema of both legs   . Elevated PSA   . Elevated PSA   . Hypertension   . Mental retardation   . Phimosis    Past Surgical History:  Past Surgical History:  Procedure Laterality Date  . PEG PLACEMENT N/A 01/24/2020   Procedure: PERCUTANEOUS ENDOSCOPIC GASTROSTOMY (PEG) PLACEMENT;  Surgeon: [name], MD;  Location: [facility] ENDOSCOPY;  Service: Endoscopy;  Laterality: N/A;  . PROSTATE BIOPSY N/A 06/22/2015   Procedure: PROSTATE BIOPSY;  Surgeon: Hollice Espy, MD;  Location: [facility] ORS;  Service: Urology;  Laterality: N/A;  . PROSTATE BIOPSY N/A 09/27/2016   Procedure: PROSTATE BIOPSY;  Surgeon: Hollice Espy, MD;  Location: [facility] ORS;  Service: Urology;  Laterality: N/A;   HPI:  69 yo male with onset of R side weakness and is minimally verbal, intellectual disability baseline. Has encephalopathy on EEG, cardiomegaly.  Has been in SNF with PEG, but was on a dysphagia 2 diet without complications. MD plans to remove PEG as it hasn't been used in months. Current CXR is clear.  Assessment / Plan / Recommendation Clinical Impression  Samuel Shelton is well known to ST service from prior hospitalization in January for CVA. He had PEG placed at that time, but has not been using since d/c to SNF. He has been consuming Dys 2 solids and thin liquids without complication. Oral mech exam was unremarkable with the exception of being edentulous. He had mildly slowed mastication with larger pieces of graham cracker in applesauce (more consistent with Dys 3). With smaller pieces of graham cracker in applesauce, pt had timely mastication and adequate oral  clearance after the swallow. No s/s aspiration were seen during this session. Recommend continuing dysphagia 2 solids with thin liquids, meds whole or crushed in puree, as tolerated.  Speech-language evaluation deferred given known history of intellectual disability. Pt was noted to state his name accurately with fair intelligibility. He was unable to state time, situation, or place, even from a field of 3 choices. Per review of chart with last hospitalization, findings are consistent with his status at discharge in January 2021. He is at baseline.   No further ST indicated on acute level. May benefit from follow up at SNF.  SLP Visit Diagnosis: Dysphagia, oral phase (R13.11)    Aspiration Risk  Mild aspiration risk    Diet Recommendation Dysphagia 2 (Fine chop);Thin liquid   Liquid Administration via: Straw Medication Administration: Whole meds with puree Supervision: Staff to assist with self feeding Compensations: Slow rate;Small sips/bites Postural Changes: Seated upright at 90 degrees    Other  Recommendations Oral Care Recommendations: Oral care BID   Follow up Recommendations Skilled Nursing facility      Frequency and Duration min 2x/week          Prognosis Prognosis for Safe Diet Advancement: Fair Barriers to Reach Goals: Cognitive deficits Barriers/Prognosis Comment: baseline intellectual disability and impulsivity      Swallow Study   General Date of Onset: 08/05/20 HPI: 69 yo male with onset of R side weakness and is minimally verbal, intellectual disability baseline..  Has encephalopathy on EEG, cardiomegaly.  Has been in SNF with PEG, but was  on a dysphagia 2 diet without complications. MD plans to remove PEG as it hasn't been used in months. Type of Study: Bedside Swallow Evaluation Previous Swallow Assessment: 01/16/20 BSE: NPO due to mentation; PEG placed on this hospitalization. Since has been on D2 and thin liquids at SNF. Diet Prior to this Study:  NPO Temperature Spikes Noted: No Respiratory Status: Room air History of Recent Intubation: No Behavior/Cognition: Alert;Cooperative;Pleasant mood;Requires cueing Oral Cavity Assessment: Within Functional Limits Oral Care Completed by SLP: Recent completion by staff Oral Cavity - Dentition: Edentulous Vision: Functional for self-feeding Self-Feeding Abilities: Needs assist Patient Positioning: Upright in bed Baseline Vocal Quality: Normal Volitional Cough: Strong Volitional Swallow: Unable to elicit    Oral/Motor/Sensory Function Overall Oral Motor/Sensory Function: Within functional limits   Ice Chips Ice chips: Not tested   Thin Liquid Thin Liquid: Within functional limits Presentation: Straw    Nectar Thick Nectar Thick Liquid: Not tested   Honey Thick Honey Thick Liquid: Not tested   Puree Puree: Within functional limits Presentation: Spoon   Solid     Solid: Impaired Presentation: Spoon Oral Phase Impairments: Impaired mastication Other Comments: ground     Samuel Shelton P. Aaisha Sliter, M.S., CCC-SLP Speech-Language Pathologist Acute Rehabilitation Services Pager: [place] 08/05/2020,9:40 AM

## 2020-08-05 NOTE — Progress Notes (Signed)
PROGRESS NOTE  Samuel Shelton  DOB: Jan 31, 1951  PCP: Sofie Hartigan, MD ZOX:[id number]  DOA: 08/03/2020  LOS: 1 day   Chief Complaint  Patient presents with  . Code Stroke   Brief narrative: Samuel Shelton is a 69 y.o. male with PMH of hypertension, CVA 2020, PEG tube in situ, intellectual disability, prostate cancer, Covid positive in January 2021, wheelchair-bound at baseline Patient was brought to the ED on 08/03/2020 from a facility with complaint of right facial droop, drooling and inability to talk. Last CVA was 01/2019 s/p tPA at that time, he had no residual deficit. He was discharged on DAPT but currently not on it. He has PEG but ready to come out as he has been able to eat.  CT head did not show any acute abnormality. Not a TPA candidate for late presentation. Not an IR candidate due to poor baseline. MRI of the brain was attempted but patient did not tolerate.   CTA head and neck did not show any intracranial large vessel occlusion.  Showed moderate to severe stenosis of multiple vessels including cavernous right ICA, left MCA, right MCA, left ACA, right vertebral artery and left PCA.  Neurology consultation was obtained. Patient was admitted to hospitalist service for further evaluation and management.  Subjective: Patient was seen and examined this morning. Lying down in bed.  Alert, awake, Only nods head to any question or verbal or motor command. No fever in last 24 hours. Heart rate between 100 and 110 bpm. Labs pending today.  Assessment/Plan: Acute neurological changes -Presented with right-sided facial weakness, drooling, inability to talk -Being worked up for possible CVA -CT head did not show any acute infarct.  Unable to obtain MRI because of patient not being cooperative. Wondering if repeat CT head would be useful.  Defer to neurology. -Neurology consultation obtained. -LDL 34, HDL 47, A1c 5.3, -Pending echocardiogram report. -PT/ST eval obtained.   Dysphagia 2 diet recommended. -Currently on aspirin 325 mg oral daily. -Continue to monitor in telemetry.   -Continue frequent neuro checks.  Acute metabolic encephalopathy -On presentation also had disorientation, combativeness which is reportedly not his baseline -Patient is more awake now but is still unable to follow commands.  Unclear how close he is status to his baseline.   -Chest x-ray unremarkable. -Urinalysis with cloudy amber urine, large amount of leukocytes and many bacteria. -Currently improving on IV Rocephin.  Urine culture noted multiple species. -Clinically improving.  No fever last 24 hours.  Continue to monitor.  Intracranial vascular stenosis -CTA head and neck did not show any intracranial large vessel occlusion.  Showed moderate to severe stenosis of multiple vessels including cavernous right ICA, left MCA, right MCA, left ACA, right vertebral artery and left PCA. -Started on aspirin. -No statin needed because of low LDL level at 34.  Dysphagia -Patient has history of dysphagia and has PEG tube in place.  Per report his oral appetite had improved.  With new neurological changes, patient seems to be dysphasic again.  Speech therapy evaluation obtained.  Dysphagia 2 diet recommended.  BPH (benign prostatic hyperplasia) -Continue Flomax and Proscar.  CKD 3a -Stable creatinine   Intellectual disability-chronic stable   Mobility: wheelchair bound at baseline.  PT/OT appreciated. Code Status:   Code Status: Full Code  Nutritional status: Body mass index is 23.47 kg/m.     Diet Order            DIET DYS 2 Room service appropriate? Yes; Fluid consistency: Thin  Diet  effective now                 DVT prophylaxis: SCD's Start: 08/03/20 2053   Antimicrobials:  IV Rocephin Fluid: None  Consultants: Neurology Family Communication:  Not at bedside  Status is: Observation  The patient will require care spanning > 2 midnights and should be moved to  inpatient because: Altered mental status, Ongoing diagnostic testing needed not appropriate for outpatient work up and IV treatments appropriate due to intensity of illness or inability to take PO  Dispo: The patient is from: Nursing home              Anticipated d/c is to: Back to nursing home              Anticipated d/c date is: Hopefully tomorrow              Patient currently is not medically stable to d/c.  Mental status is not yet back to baseline.  Pending echocardiogram.  Infusions:  . sodium chloride 75 mL/hr at 08/05/20 0637  . cefTRIAXone (ROCEPHIN)  IV 1 g (08/04/20 2033)    Scheduled Meds: .  stroke: mapping our early stages of recovery book   Does not apply Once  . aspirin  300 mg Rectal Daily   Or  . aspirin  325 mg Oral Daily  . chlorhexidine  15 mL Mouth Rinse BID  . feeding supplement (GLUCERNA 1.2 CAL)  20 mL/hr Per Tube Q24H  . finasteride  5 mg Oral Daily  . mouth rinse  15 mL Mouth Rinse q12n4p  . pantoprazole  40 mg Oral Daily    Antimicrobials: Anti-infectives (From admission, onward)   Start     Dose/Rate Route Frequency Ordered Stop   08/04/20 1800  cefTRIAXone (ROCEPHIN) 1 g in sodium chloride 0.9 % 100 mL IVPB     Discontinue     1 g 200 mL/hr over 30 Minutes Intravenous Every 24 hours 08/03/20 2326     08/03/20 1800  cefTRIAXone (ROCEPHIN) 1 g in sodium chloride 0.9 % 100 mL IVPB        1 g 200 mL/hr over 30 Minutes Intravenous  Once 08/03/20 1746 08/03/20 1843      PRN meds: acetaminophen **OR** acetaminophen (TYLENOL) oral liquid 160 mg/5 mL **OR** acetaminophen   Objective: Vitals:   08/05/20 0343 08/05/20 0742  BP: 102/75 119/84  Pulse: 100 95  Resp: 17 20  Temp: 98 F (36.7 C) 98.3 F (36.8 C)  SpO2: 100% 100%   No intake or output data in the 24 hours ending 08/05/20 1326 Filed Weights   08/03/20 1718  Weight: 78.5 kg   Weight change:  Body mass index is 23.47 kg/m.   Physical Exam: General exam: Appears calm and  comfortable.  Not in physical distress Skin: No rashes, lesions or ulcers. HEENT: Atraumatic, normocephalic, supple neck, no obvious bleeding Lungs: Clear to auscultation bilaterally. CVS: Regular rate and rhythm, no murmur GI/Abd soft, nontender, nondistended, bowel sound present CNS: Awake, alert.  Unable to answer questions or follow commands.   Psychiatry: Mood appropriate Extremities: No pedal edema, no calf tenderness  Data Review: I have personally reviewed the laboratory data and studies available.  Recent Labs  Lab 08/03/20 1641 08/03/20 1648 08/03/20 2108  WBC 9.1  --   --   NEUTROABS 7.3  --   --   HGB 10.7* 11.9* 12.6*  HCT 36.1* 35.0* 37.0*  MCV 90.0  --   --  PLT 335  --   --    Recent Labs  Lab 08/03/20 1641 08/03/20 1648 08/03/20 2108  NA 144 146* 146*  K 3.8 3.6 4.6  CL 108 110  --   CO2 24  --   --   GLUCOSE 102* 97  --   BUN 34* 33*  --   CREATININE 1.51* 1.40*  --   CALCIUM 9.1  --   --    Lab Results  Component Value Date   HGBA1C 5.3 08/04/2020       Component Value Date/Time   CHOL 92 08/04/2020 0130   TRIG 56 08/04/2020 0130   HDL 47 08/04/2020 0130   CHOLHDL 2.0 08/04/2020 0130   VLDL 11 08/04/2020 0130   LDLCALC 34 08/04/2020 0130   Signed, Terrilee Croak, MD [facility] Pager: [phone] (Secure Chat preferred). 08/05/2020

## 2020-08-05 NOTE — Progress Notes (Signed)
  Echocardiogram 2D Echocardiogram has been performed.  Samuel Shelton M 08/05/2020, 10:35 AM

## 2020-08-05 NOTE — NC FL2 (Signed)
[place] LEVEL OF CARE SCREENING TOOL     IDENTIFICATION  Patient Name: Samuel Shelton Birthdate: 1951/08/26 Sex: male Admission Date (Current Location): 08/03/2020  [facility] and [place] Number:  [employer] and Address:  [facility] [place] [place] [address], [place], [place] [address]      Provider Number: [id number]  Attending Physician Name and Address:  Terrilee Croak, MD  Relative Name and Phone Number:       Current Level of Care: Hospital Recommended Level of Care: [place] Prior Approval Number:    Date Approved/Denied:   PASRR Number:    Discharge Plan: SNF    Current Diagnoses: Patient Active Problem List   Diagnosis Date Noted  . Acute encephalopathy 08/04/2020  . Right sided weakness 08/03/2020  . CKD (chronic kidney disease), stage III 08/03/2020  . Acute lower UTI 08/03/2020  . Gross hematuria   . Oropharyngeal dysphagia   . Acute gastritis without hemorrhage   . Postpyloric ulcer   . Protein-calorie malnutrition, severe ([place])   . Palliative care by specialist   . DNR (do not resuscitate) discussion   . Hypernatremia 01/08/2020  . Hypercalcemia 01/08/2020  . Acute metabolic encephalopathy 20/35/5974  . COVID-19 virus infection 01/08/2020  . Acute renal failure ([place]) 01/08/2020  . Dehydration 01/08/2020  . Elevated troponin 01/08/2020  . Lactic acidosis 01/08/2020  . Altered mental status   . Hyperlipidemia 01/29/2019  . Family hx-stroke 01/29/2019  . BPH (benign prostatic hyperplasia) 01/29/2019  . Stroke (cerebrum) ([place]) 01/28/2019  . Edema leg 08/20/2014  . Chronic mental disorder 08/20/2014  . HTN (hypertension) 08/20/2014  . Intellectual disability 08/20/2014    Orientation RESPIRATION BLADDER Height & Weight      (disoriented)  Normal Incontinent Weight: 173 lb 1 oz (78.5 kg) Height:  6' (182.9 cm)  BEHAVIORAL SYMPTOMS/MOOD NEUROLOGICAL BOWEL NUTRITION STATUS      Incontinent  Diet (see DC summary)  AMBULATORY STATUS COMMUNICATION OF NEEDS Skin   Total Care Verbally Other (Comment) (unstageable right heel ulcer)                       Personal Care Assistance Level of Assistance  Bathing, Feeding, Dressing Bathing Assistance: Maximum assistance Feeding assistance: Maximum assistance Dressing Assistance: Maximum assistance     Functional Limitations Info  Speech     Speech Info: Impaired (incomprehensible)    SPECIAL CARE FACTORS FREQUENCY                       Contractures Contractures Info: Not present    Additional Factors Info  Code Status, Allergies Code Status Info: Full Allergies Info: NKA           Current Medications (08/05/2020):  This is the current hospital active medication list Current Facility-Administered Medications  Medication Dose Route Frequency Provider Last Rate Last Admin  .  stroke: mapping our early stages of recovery book   Does not apply Once Doutova, Nyoka Lint, MD      . 0.9 %  sodium chloride infusion   Intravenous Continuous Toy Baker, MD 75 mL/hr at 08/05/20 0637 New Bag at 08/05/20 1638  . acetaminophen (TYLENOL) tablet 650 mg  650 mg Oral Q4H PRN Toy Baker, MD       Or  . acetaminophen (TYLENOL) 160 MG/5ML solution 650 mg  650 mg Per Tube Q4H PRN Toy Baker, MD   650 mg at 08/04/20 1354  Or  . acetaminophen (TYLENOL) suppository 650 mg  650 mg Rectal Q4H PRN Toy Baker, MD   650 mg at 08/04/20 0334  . aspirin suppository 300 mg  300 mg Rectal Daily Doutova, Anastassia, MD   300 mg at 08/04/20 1354   Or  . aspirin tablet 325 mg  325 mg Oral Daily Doutova, Anastassia, MD      . cefTRIAXone (ROCEPHIN) 1 g in sodium chloride 0.9 % 100 mL IVPB  1 g Intravenous Q24H Doutova, Anastassia, MD 200 mL/hr at 08/04/20 2033 1 g at 08/04/20 2033  . chlorhexidine (PERIDEX) 0.12 % solution 15 mL  15 mL Mouth Rinse BID Dahal, Binaya, MD      . feeding supplement (GLUCERNA 1.2  CAL) liquid  20 mL/hr Per Tube Q24H Dahal, Marlowe Aschoff, MD 20 mL/hr at 08/04/20 2145 20 mL/hr at 08/04/20 2145  . finasteride (PROSCAR) tablet 5 mg  5 mg Oral Daily Doutova, Anastassia, MD      . MEDLINE mouth rinse  15 mL Mouth Rinse q12n4p Dahal, Binaya, MD      . pantoprazole (PROTONIX) EC tablet 40 mg  40 mg Oral Daily Toy Baker, MD         Discharge Medications: Please see discharge summary for a list of discharge medications.  Relevant Imaging Results:  Relevant Lab Results:   Additional Information SS#: [id number]  Geralynn Ochs, LCSW

## 2020-08-05 NOTE — TOC Initial Note (Signed)
Transition of Care [facility]) - Initial/Assessment Note    Patient Details  Name: Samuel Shelton MRN: [id number] Date of Birth: 02/28/1951  [facility] CM/SW Contact:    Pollie Friar, RN Phone Number: 08/05/2020, 1:07 PM  Clinical Narrative:                 Pt is a long term resident at [facility] and [place]. CM met with the patient and his niece and the plan is for him to return to [place] at d/c. Niece would like him to receive some rehab at the facility. TOC following.  Expected Discharge Plan: Skilled Nursing Facility Barriers to Discharge: Continued Medical Work up   Patient Goals and CMS Choice   CMS Medicare.gov Compare Post Acute Care list provided to:: Patient Represenative (must comment) Choice offered to / list presented to :  (niece)  Expected Discharge Plan and Services Expected Discharge Plan: Skilled Nursing Facility In-house Referral: Clinical Social Work Discharge Planning Services: CM Consult Post Acute Care Choice: [place] Living arrangements for the past 2 months: [place]                                      Prior Living Arrangements/Services Living arrangements for the past 2 months: [place] Lives with:: Facility Resident Patient language and need for interpreter reviewed:: Yes        Need for Family Participation in Patient Care: Yes (Comment) Care giver support system in place?: No (comment)   Criminal Activity/Legal Involvement Pertinent to Current Situation/Hospitalization: No - Comment as needed  Activities of Daily Living Home Assistive Devices/Equipment: Wheelchair ADL Screening (condition at time of admission) Patient's cognitive ability adequate to safely complete daily activities?: No Is the patient deaf or have difficulty hearing?: No Does the patient have difficulty seeing, even when wearing glasses/contacts?: No Does the patient have difficulty concentrating,  remembering, or making decisions?: Yes Patient able to express need for assistance with ADLs?: No Does the patient have difficulty dressing or bathing?: Yes Independently performs ADLs?: No Communication: Needs assistance Is this a change from baseline?: Pre-admission baseline Dressing (OT): Dependent Is this a change from baseline?: Pre-admission baseline Grooming: Dependent Is this a change from baseline?: Pre-admission baseline Feeding: Independent Bathing: Dependent Is this a change from baseline?: Pre-admission baseline Toileting: Dependent Is this a change from baseline?: Pre-admission baseline In/Out Bed: Dependent Is this a change from baseline?: Pre-admission baseline Does the patient have difficulty walking or climbing stairs?: Yes Weakness of Legs: None Weakness of Arms/Hands: None  Permission Sought/Granted                  Emotional Assessment Appearance:: Appears stated age         Psych Involvement: No (comment)  Admission diagnosis:  Encephalopathy [G93.40] Right sided weakness [R53.1] Acute encephalopathy [G93.40] Patient Active Problem List   Diagnosis Date Noted  . Acute encephalopathy 08/04/2020  . Right sided weakness 08/03/2020  . CKD (chronic kidney disease), stage III 08/03/2020  . Acute lower UTI 08/03/2020  . Gross hematuria   . Oropharyngeal dysphagia   . Acute gastritis without hemorrhage   . Postpyloric ulcer   . Protein-calorie malnutrition, severe ([place])   . Palliative care by specialist   . DNR (do not resuscitate) discussion   . Hypernatremia 01/08/2020  . Hypercalcemia 01/08/2020  . Acute metabolic encephalopathy 73/71/0626  . COVID-19  virus infection 01/08/2020  . Acute renal failure ([place]) 01/08/2020  . Dehydration 01/08/2020  . Elevated troponin 01/08/2020  . Lactic acidosis 01/08/2020  . Altered mental status   . Hyperlipidemia 01/29/2019  . Family hx-stroke 01/29/2019  . BPH (benign prostatic hyperplasia) 01/29/2019   . 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 Edema leg 08/20/2014  . Chronic mental disorder 08/20/2014  . HTN (hypertension) 08/20/2014  . Intellectual disability 08/20/2014   PCP:  Sofie Hartigan, MD Pharmacy:   [facility], [address] [place] [address] Phone: [phone] Fax: [phone]     Social Determinants of Health (SDOH) Interventions    Readmission Risk Interventions No flowsheet data found.

## 2020-08-06 DIAGNOSIS — N390 Urinary tract infection, site not specified: Secondary | ICD-10-CM | POA: Diagnosis not present

## 2020-08-06 LAB — CBC WITH DIFFERENTIAL/PLATELET
Abs Immature Granulocytes: 0.02 10*3/uL (ref 0.00–0.07)
Basophils Absolute: 0 10*3/uL (ref 0.0–0.1)
Basophils Relative: 1 %
Eosinophils Absolute: 0.3 10*3/uL (ref 0.0–0.5)
Eosinophils Relative: 4 %
HCT: 33.9 % — ABNORMAL LOW (ref 39.0–52.0)
Hemoglobin: 10.1 g/dL — ABNORMAL LOW (ref 13.0–17.0)
Immature Granulocytes: 0 %
Lymphocytes Relative: 17 %
Lymphs Abs: 1.2 10*3/uL (ref 0.7–4.0)
MCH: 26.7 pg (ref 26.0–34.0)
MCHC: 29.8 g/dL — ABNORMAL LOW (ref 30.0–36.0)
MCV: 89.7 fL (ref 80.0–100.0)
Monocytes Absolute: 0.5 10*3/uL (ref 0.1–1.0)
Monocytes Relative: 7 %
Neutro Abs: 5.1 10*3/uL (ref 1.7–7.7)
Neutrophils Relative %: 71 %
Platelets: 384 10*3/uL (ref 150–400)
RBC: 3.78 MIL/uL — ABNORMAL LOW (ref 4.22–5.81)
RDW: 15.5 % (ref 11.5–15.5)
WBC: 7.1 10*3/uL (ref 4.0–10.5)
nRBC: 0 % (ref 0.0–0.2)

## 2020-08-06 LAB — BASIC METABOLIC PANEL
Anion gap: 11 (ref 5–15)
BUN: 15 mg/dL (ref 8–23)
CO2: 23 mmol/L (ref 22–32)
Calcium: 9.1 mg/dL (ref 8.9–10.3)
Chloride: 113 mmol/L — ABNORMAL HIGH (ref 98–111)
Creatinine, Ser: 1.03 mg/dL (ref 0.61–1.24)
GFR calc Af Amer: >60 mL/min (ref >60)
GFR calc non Af Amer: >60 mL/min (ref >60)
Glucose, Bld: 103 mg/dL — ABNORMAL HIGH (ref 70–99)
Potassium: 4.5 mmol/L (ref 3.5–5.1)
Sodium: 147 mmol/L — ABNORMAL HIGH (ref 135–145)

## 2020-08-06 LAB — GLUCOSE, CAPILLARY
Glucose-Capillary: 84 mg/dL (ref 70–99)
Glucose-Capillary: 87 mg/dL (ref 70–99)

## 2020-08-06 LAB — SARS CORONAVIRUS 2 BY RT PCR (HOSPITAL ORDER, PERFORMED IN ~~LOC~~ HOSPITAL LAB): SARS Coronavirus 2: NEGATIVE

## 2020-08-06 MED ORDER — DEXTROSE 5 % IV SOLN: INTRAVENOUS | Status: DC

## 2020-08-06 NOTE — Progress Notes (Signed)
PROGRESS NOTE  Samuel Shelton  DOB: [birth year]  PCP: Sofie Hartigan, MD ZYS:[id number]  DOA: 08/03/2020  LOS: 2 days   Chief Complaint  Patient presents with  . Code Stroke   Brief narrative: Samuel Shelton is a 69 y.o. male with PMH of hypertension, CVA 2020, PEG tube in situ, intellectual disability, prostate cancer, Covid positive in January 2021, wheelchair-bound at baseline Patient was brought to the ED on 08/03/2020 from a facility with complaint of right facial droop, drooling and inability to talk. Last CVA was 01/2019 s/p tPA at that time, he had no residual deficit. He was discharged on DAPT but currently not on it. He has PEG but ready to come out as he has been able to eat.  CT head did not show any acute abnormality. Not a TPA candidate for late presentation. Not an IR candidate due to poor baseline. MRI of the brain was attempted but patient did not tolerate.   CTA head and neck did not show any intracranial large vessel occlusion.  Showed moderate to severe stenosis of multiple vessels including cavernous right ICA, left MCA, right MCA, left ACA, right vertebral artery and left PCA.  Neurology consultation was obtained. Patient was admitted to hospitalist service for further evaluation and management.  Subjective: Patient was seen and examined this morning. Lying on bed.  Opens eyes on verbal command.  Inconsistently follows motor commands.  He was able to raise his left hand on my, but unable to follow any other commands.  Could not have a verbal conversation with me. Blood pressure in last 24 hours mostly close to 140. Labs from this morning with sodium level elevated at 147.  Assessment/Plan: Acute neurological changes -Presented with right-sided facial weakness, drooling, inability to talk -Being worked up for possible CVA -CT head did not show any acute infarct.  Unable to obtain MRI because of patient not being cooperative. Wondering if repeat CT head would  be useful.  Defer to neurology. -Neurology consultation obtained. -LDL 34, HDL 47, A1c 5.3, -echocardiogram with EF 55 to 60%. -PT/ST eval obtained.  Dysphagia 2 diet recommended. -Prior to admission, patient was not on any antiplatelet.  Currently on aspirin 325 mg oral daily. -Continue to monitor in telemetry.   -Continue frequent neuro checks.  Acute metabolic encephalopathy -On presentation also had disorientation, combativeness which is reportedly not his baseline.  It is likely from a combination of previous stroke, UTI, hypernatremia. -Mental status is gradually improving but not yet back to baseline. -Chest x-ray unremarkable. -Urinalysis with cloudy amber urine, large amount of leukocytes and many bacteria. -Currently improving on IV Rocephin.  Urine culture noted multiple species. -Clinically improving.  No fever last 48 hours.  Continue to monitor.  Intracranial vascular stenosis -CTA head and neck did not show any intracranial large vessel occlusion.  Showed moderate to severe stenosis of multiple vessels including cavernous right ICA, left MCA, right MCA, left ACA, right vertebral artery and left PCA. -Started on aspirin. -No statin needed because of low LDL level at 34.  Acute hypernatremia -sodium level was 146 on presentation and remains at 147 today.  Likely because of poor oral free water intake. -Switch IV fluid to dextrose at 75 mL/h.  Dysphagia -Patient has history of dysphagia and has PEG tube in place.  Per report his oral appetite had improved.  With new neurological changes, patient seems to be dysphasic again.  Speech therapy evaluation obtained.  Dysphagia 2 diet recommended.  BPH (benign prostatic hyperplasia) -  Continue Flomax and Proscar.  CKD 3a -Stable creatinine   Intellectual disability-chronic stable   Mobility: wheelchair bound at baseline.  PT/OT appreciated. Code Status:   Code Status: Full Code  Nutritional status: Body mass index is 23.47  kg/m.     Diet Order            DIET DYS 2 Room service appropriate? Yes; Fluid consistency: Thin  Diet effective now                 DVT prophylaxis: SCD's Start: 08/03/20 2053   Antimicrobials:  IV Rocephin Fluid: None  Consultants: Neurology Family Communication:  Not at bedside  Status is: Observation  The patient will require care spanning > 2 midnights and should be moved to inpatient because: Altered mental status, Ongoing diagnostic testing needed not appropriate for outpatient work up and IV treatments appropriate due to intensity of illness or inability to take PO  Dispo: The patient is from: Nursing home              Anticipated d/c is to: Back to nursing home              Anticipated d/c date is: Hopefully tomorrow              Patient currently is not medically stable to d/c.  Mental status is not yet back to baseline.  Sodium level elevated.  Infusions:  . cefTRIAXone (ROCEPHIN)  IV 1 g (08/05/20 1736)  . dextrose      Scheduled Meds: .  stroke: mapping our early stages of recovery book   Does not apply Once  . aspirin  300 mg Rectal Daily   Or  . aspirin  325 mg Oral Daily  . chlorhexidine  15 mL Mouth Rinse BID  . Chlorhexidine Gluconate Cloth  6 each Topical Q0600  . finasteride  5 mg Oral Daily  . mouth rinse  15 mL Mouth Rinse q12n4p  . mupirocin ointment  1 application Nasal BID  . pantoprazole  40 mg Oral Daily    Antimicrobials: Anti-infectives (From admission, onward)   Start     Dose/Rate Route Frequency Ordered Stop   08/04/20 1800  cefTRIAXone (ROCEPHIN) 1 g in sodium chloride 0.9 % 100 mL IVPB     Discontinue     1 g 200 mL/hr over 30 Minutes Intravenous Every 24 hours 08/03/20 2326     08/03/20 1800  cefTRIAXone (ROCEPHIN) 1 g in sodium chloride 0.9 % 100 mL IVPB        1 g 200 mL/hr over 30 Minutes Intravenous  Once 08/03/20 1746 08/03/20 1843      PRN meds: acetaminophen **OR** acetaminophen (TYLENOL) oral liquid 160 mg/5 mL  **OR** acetaminophen   Objective: Vitals:   08/06/20 0437 08/06/20 0756  BP: (!) 168/93 (!) 147/93  Pulse: 99 98  Resp: 18 16  Temp: 99.5 F (37.5 C) 98.8 F (37.1 C)  SpO2: 97% 96%    Intake/Output Summary (Last 24 hours) at 08/06/2020 1355 Last data filed at 08/05/2020 1840 Gross per 24 hour  Intake 2844.16 ml  Output 400 ml  Net 2444.16 ml   Filed Weights   08/03/20 1718  Weight: 78.5 kg   Weight change:  Body mass index is 23.47 kg/m.   Physical Exam: General exam: Appears calm and comfortable.  Not in physical distress Skin: No rashes, lesions or ulcers. HEENT: Atraumatic, normocephalic, supple neck, no obvious bleeding Lungs: Clear to auscultate bilaterally CVS:  Regular rate and rhythm, no murmur GI/Abd soft, nontender, nondistended, bowel sound present CNS: alert, inconsistently follows motor commands.  Looks more awake than at presentation. Psychiatry: Mood appropriate Extremities: No pedal edema, no calf tenderness  Data Review: I have personally reviewed the laboratory data and studies available.  Recent Labs  Lab 08/03/20 1641 08/03/20 1648 08/03/20 2108 08/06/20 1100  WBC 9.1  --   --  7.1  NEUTROABS 7.3  --   --  5.1  HGB 10.7* 11.9* 12.6* 10.1*  HCT 36.1* 35.0* 37.0* 33.9*  MCV 90.0  --   --  89.7  PLT 335  --   --  384   Recent Labs  Lab 08/03/20 1641 08/03/20 1648 08/03/20 2108 08/06/20 1100  NA 144 146* 146* 147*  K 3.8 3.6 4.6 4.5  CL 108 110  --  113*  CO2 24  --   --  23  GLUCOSE 102* 97  --  103*  BUN 34* 33*  --  15  CREATININE 1.51* 1.40*  --  1.03  CALCIUM 9.1  --   --  9.1   Lab Results  Component Value Date   HGBA1C 5.3 08/04/2020       Component Value Date/Time   CHOL 92 08/04/2020 0130   TRIG 56 08/04/2020 0130   HDL 47 08/04/2020 0130   CHOLHDL 2.0 08/04/2020 0130   VLDL 11 08/04/2020 0130   LDLCALC 34 08/04/2020 0130   Signed, Terrilee Croak, MD [facility] Pager: [phone] (Secure Chat  preferred). 08/06/2020

## 2020-08-07 DIAGNOSIS — N390 Urinary tract infection, site not specified: Secondary | ICD-10-CM | POA: Diagnosis not present

## 2020-08-07 MED ORDER — CEFDINIR 300 MG PO CAPS
300.0000 mg | ORAL_CAPSULE | Freq: Two times a day (BID) | ORAL | 0 refills | Status: AC
Start: 2020-08-07 — End: 2020-08-10

## 2020-08-07 MED ORDER — ASPIRIN 325 MG PO TABS: 325.0000 mg | ORAL_TABLET | Freq: Every day | ORAL | Status: DC

## 2020-08-07 NOTE — Plan of Care (Signed)
?  Problem: Nutrition: ?Goal: Dietary intake will improve ?Outcome: Progressing ?  ?

## 2020-08-07 NOTE — Plan of Care (Signed)
  Problem: Nutrition: Goal: Dietary intake will improve 08/07/2020 1241 by Caroll Rancher, RN Outcome: Adequate for Discharge 08/07/2020 0826 by Caroll Rancher, RN Outcome: Progressing

## 2020-08-07 NOTE — Discharge Summary (Signed)
Physician Discharge Summary  Samuel Shelton TIW:[id number] DOB: 01-Sep-1951 DOA: 08/03/2020  PCP: Sofie Hartigan, MD  Admit date: 08/03/2020 Discharge date: 08/07/2020  Admitted From: SNF Discharge disposition: Back to SNF   Code Status: Full Code  Diet Recommendation: Dysphagia 2 diet  Discharge Diagnosis:   Principal Problem:   Acute metabolic encephalopathy Active Problems:   Acute hypernatremia   Acute lower UTI   Intellectual disability   BPH (benign prostatic hyperplasia)   Right sided weakness   CKD (chronic kidney disease), stage III   Acute encephalopathy   History of Present Illness / Brief narrative:  Samuel Shelton is a 69 y.o. male with PMH of hypertension, CVA 2020, PEG tube in situ, intellectual disability, prostate cancer, Covid positive in January 2021, wheelchair-bound at baseline Patient was brought to the ED on 08/03/2020 from a facility with complaint of right facial droop, drooling and inability to talk. Last CVA was2/2020 s/p tPA at that time, he hadno residual deficit. He was discharged on DAPT but currently not on it. He has PEG but ready to come out as he has been able to eat.  CT head did not show any acute abnormality. Not a TPA candidate for late presentation. Not an IR candidate due to poor baseline. MRI of the brain was attempted but patient did not tolerate.   CTA head and neck did not show any intracranial large vessel occlusion.  Showed moderate to severe stenosis of multiple vessels including cavernous right ICA, left MCA, right MCA, left ACA, right vertebral artery and left PCA.  Neurology consultation was obtained. Patient was admitted to hospitalist service for further evaluation and management.  Hospital Course:   Acute neurological changes -Presented with right-sided facial weakness, drooling, inability to talk -Admitted for possible CVA. -Neurology consultation obtained. -CT head did not show any acute infarct.  Unable to  obtain MRI because of patient not being cooperative. Repeat CT head next morning did not show any new finding.  -LDL 34, HDL 47, A1c 5.3, -echocardiogram with EF 55 to 60%. -Speech therapy evaluation was obtained. Dysphagia 2 diet recommended. -Prior to admission, patient was not on any antiplatelet.   Neurology recommended aspirin 325 mg oral daily.  Acute metabolic encephalopathy UTI -On presentation also had disorientation, combativeness which is reportedly not his baseline.  It is likely from a combination of previous stroke, UTI, hypernatremia. -Mental status gradually improved and is now at baseline.  -On admission, chest x-ray was unremarkable. -Urinalysis with cloudy amber urine, large amount of leukocytes and many bacteria. -Clinically improving on IV Rocephin. Urine culture showed multiple species. -3 more days of oral Omnicef at discharge.  Intracranial vascular stenosis -CTA head and neck did not show any intracranial large vessel occlusion.  Showed moderate to severe stenosis of multiple vessels including cavernous right ICA, left MCA, right MCA, left ACA, right vertebral artery and left PCA. -Started on aspirin 325 mg daily.. -No statin needed because of low LDL level at 34.  Acute hypernatremia -sodium level was 146 on presentation and remained elevated till 8/12. Likely because of poor oral free water intake. -He was started on dextrose drip.  -Patient has poor oral intake and is at risk of dehydration again. -Fortunately, he does have a PEG tube which can be used to give free water daily. -Patient's niece and power of attorney asked me if PEG tube can be removed. I would recommend to continue PEG tube to use it for hydration. -Recommend 120 mL of plain water  q4hrs through PEG tube post discharge.  Dysphagia -Patient has history of dysphagia and has PEG tube in place. -Seen by speech therapist. Patient continue dysphagia 2 diet. As mentioned above, PEG tube can be  used for hydration.  BPH(benign prostatic hyperplasia) -Continue Cardura and Proscar.  CKD 3a -Stable creatinine   Intellectual disability-chronic stable   Mobility: wheelchair bound at baseline.  PT/OT appreciated. Code Status:  Code Status: Full Code  Nutritional status: Body mass index is 23.47 kg/m.    Diet Order                  DIET DYS 2 Room service appropriate? Yes; Fluid consistency: Thin  Diet effective now                   Wound care: Pressure Injury 01/11/20 Heel Left (Active)  01/11/20 0137  Location: Heel  Location Orientation: Left  Staging:   Wound Description (Comments):   Present on Admission:      Pressure Injury 01/22/20 Heel Right Deep Tissue Pressure Injury - Purple or maroon localized area of discolored intact skin or blood-filled blister due to damage of underlying soft tissue from pressure and/or shear. dark purple (Active)  01/22/20 1427  Location: Heel  Location Orientation: Right  Staging: Deep Tissue Pressure Injury - Purple or maroon localized area of discolored intact skin or blood-filled blister due to damage of underlying soft tissue from pressure and/or shear.  Wound Description (Comments): dark purple  Present on Admission:      Pressure Injury 08/04/20 Heel Right Unstageable - Full thickness tissue loss in which the base of the injury is covered by slough (yellow, tan, gray, green or brown) and/or eschar (tan, brown or black) in the wound bed. soft dark and scaly (Active)  08/04/20 1554  Location: Heel  Location Orientation: Right  Staging: Unstageable - Full thickness tissue loss in which the base of the injury is covered by slough (yellow, tan, gray, green or brown) and/or eschar (tan, brown or black) in the wound bed.  Wound Description (Comments): soft dark and scaly  Present on Admission: Yes    Subjective:  Seen and examined this morning. Not in distress. No new symptoms. Able to nod head on questions.  Inconsistent motor response. Baseline. I spoke with his niece over the phone.    Discharge Exam:   Vitals:   08/06/20 1941 08/07/20 0033 08/07/20 0050 08/07/20 0430  BP: 130/85 (!) 139/92 (!) 154/91 (!) 145/100  Pulse:    95  Resp: (!) 22 (!) 21  20  Temp: 97.9 F (36.6 C) 99.3 F (37.4 C) 97.9 F (36.6 C) 97.9 F (36.6 C)  TempSrc: Axillary Axillary Axillary Axillary  SpO2: 98% 100%  100%  Weight:      Height:        Body mass index is 23.47 kg/m.  General exam: Appears calm and comfortable. Not in physical distress Skin: No rashes, lesions or ulcers. HEENT: Atraumatic, normocephalic, supple neck, no obvious bleeding Lungs: Clear to auscultation bilaterally CVS: Regular rate and rhythm, no murmur GI/Abd soft, nontender nondistended, bowel sound present, PEG tube site intact CNS: At baseline, alert, awake Psychiatry: Mood appropriate Extremities: No edema, no calf tenderness  Follow ups:   Discharge Instructions    Increase activity slowly   Complete by: As directed    Leave dressing on - Keep it clean, dry, and intact until clinic visit   Complete by: As directed  Contact information for follow-up providers    [facility]. Schedule an appointment as soon as possible for a visit in 4 week(s).   Specialty: Neurology Contact information: [address] [place] [phone]       Sofie Hartigan, MD Follow up.   Specialty: Family Medicine Contact information: [place] [address] [phone]            Contact information for after-discharge care    [facility] .   Service: Skilled Nursing Contact information: [address] [place] [phone]                  Recommendations for Outpatient Follow-Up:   1. Follow-up with PCP as an outpatient  Discharge Instructions:  Follow with Primary MD  Sofie Hartigan, MD in 7 days   Get CBC/BMP checked in next visit within 1 week by PCP or SNF MD ( we routinely change or add medications that can affect your baseline labs and fluid status, therefore we recommend that you get the mentioned basic workup next visit with your PCP, your PCP may decide not to get them or add new tests based on their clinical decision)  On your next visit with your PCP, please Get Medicines reviewed and adjusted.  Please request your PCP  to go over all Hospital Tests and Procedure/Radiological results at the follow up, please get all Hospital records sent to your Prim MD by signing hospital release before you go home.  Activity: As tolerated with Full fall precautions use walker/cane & assistance as needed  For Heart failure patients - Check your Weight same time everyday, if you gain over 2 pounds, or you develop in leg swelling, experience more shortness of breath or chest pain, call your Primary MD immediately. Follow Cardiac Low Salt Diet and 1.5 lit/day fluid restriction.  If you have smoked or chewed Tobacco in the last 2 yrs please stop smoking, stop any regular Alcohol  and or any Recreational drug use.  If you experience worsening of your admission symptoms, develop shortness of breath, life threatening emergency, suicidal or homicidal thoughts you must seek medical attention immediately by calling 911 or calling your MD immediately  if symptoms less severe.  You Must read complete instructions/literature along with all the possible adverse reactions/side effects for all the Medicines you take and that have been prescribed to you. Take any new Medicines after you have completely understood and accpet all the possible adverse reactions/side effects.   Do not drive, operate heavy machinery, perform activities at heights, swimming or participation in water activities or provide baby sitting services if your were admitted for syncope or siezures until you have  seen by Primary MD or a Neurologist and advised to do so again.  Do not drive when taking Pain medications.  Do not take more than prescribed Pain, Sleep and Anxiety Medications  Wear Seat belts while driving.   Please note You were cared for by a hospitalist during your hospital stay. If you have any questions about your discharge medications or the care you received while you were in the hospital after you are discharged, you can call the unit and asked to speak with the hospitalist on call if the hospitalist that took care of you is not available. Once you are discharged, your primary care physician will handle any further medical issues. Please note that NO  REFILLS for any discharge medications will be authorized once you are discharged, as it is imperative that you return to your primary care physician (or establish a relationship with a primary care physician if you do not have one) for your aftercare needs so that they can reassess your need for medications and monitor your lab values.    Allergies as of 08/07/2020   No Known Allergies     Medication List    STOP taking these medications   feeding supplement (JEVITY 1.5 CAL/FIBER) Liqd   tamsulosin 0.4 MG Caps capsule Commonly known as: Flomax     TAKE these medications   acetaminophen 500 MG tablet Commonly known as: TYLENOL Take 500 mg by mouth every 4 (four) hours as needed for mild pain.   aspirin 325 MG tablet Take 1 tablet (325 mg total) by mouth daily. Start taking on: August 08, 2020   cefdinir 300 MG capsule Commonly known as: OMNICEF Take 1 capsule (300 mg total) by mouth 2 (two) times daily for 3 days.   doxazosin 1 MG tablet Commonly known as: CARDURA Take 1 mg by mouth daily.   ferrous sulfate 325 (65 FE) MG tablet Take 325 mg by mouth daily with breakfast.   finasteride 5 MG tablet Commonly known as: PROSCAR Take 1 tablet (5 mg total) by mouth daily.   free water Soln Place 120 mLs into feeding  tube every 4 (four) hours.   pantoprazole 40 MG tablet Commonly known as: PROTONIX Take 40 mg by mouth daily. What changed: Another medication with the same name was removed. Continue taking this medication, and follow the directions you see here.            Discharge Care Instructions  (From admission, onward)         Start     Ordered   08/07/20 0000  Leave dressing on - Keep it clean, dry, and intact until clinic visit        08/07/20 1112          Time coordinating discharge: 35 minutes  The results of significant diagnostics from this hospitalization (including imaging, microbiology, ancillary and laboratory) are listed below for reference.    Procedures and Diagnostic Studies:   CT ANGIO HEAD W OR WO CONTRAST  Result Date: 08/03/2020 CLINICAL DATA:  Stroke/TIA, assess intracranial arteries. Right-sided weakness. EXAM: CT ANGIOGRAPHY HEAD AND NECK TECHNIQUE: Multidetector CT imaging of the head and neck was performed using the standard protocol during bolus administration of intravenous contrast. Multiplanar CT image reconstructions and MIPs were obtained to evaluate the vascular anatomy. Carotid stenosis measurements (when applicable) are obtained utilizing NASCET criteria, using the distal internal carotid diameter as the denominator. CONTRAST:  57mL OMNIPAQUE IOHEXOL 350 MG/ML SOLN COMPARISON:  Noncontrast head CT 08/03/2020, CT angiogram head/neck 01/28/2019 FINDINGS: CTA NECK FINDINGS Aortic arch: Ascending thoracic aortic aneurysm measuring up to 4.4 cm in diameter. Atherosclerotic plaque within the visualized aortic arch and proximal major branch vessels of the neck. No hemodynamically significant innominate or proximal subclavian artery stenosis. Right carotid system: CCA and ICA patent within the neck without significant stenosis (50% or greater). Mild atherosclerotic plaque within the carotid bifurcation. Left carotid system: Streak artifact from a dense left-sided  contrast bolus limits evaluation of the CCA. Within this limitation, the CCA and ICA are patent within the neck without appreciable hemodynamically significant stenosis. Mild calcified plaque within the carotid bifurcation. Vertebral arteries: Streak artifact from a dense left-sided contrast bolus limits evaluation of  the left vertebral artery origin and V1 segment. Within this limitation, the vertebral arteries are patent within the neck without appreciable hemodynamically significant stenosis. Skeleton: No acute bony abnormality or aggressive osseous lesion. Cervical spondylosis. Other neck: No cervical lymphadenopathy.  Thyroid unremarkable. Upper chest: No consolidation within the imaged lung apices. Review of the MIP images confirms the above findings CTA HEAD FINDINGS Anterior circulation: Evaluation of the intracranial arterial circulation is limited due to a suboptimal contrast bolus. The intracranial internal carotid arteries are patent. Calcified plaque within both vessels. Redemonstrated moderate to moderately severe stenosis of the cavernous right ICA. The M1 middle cerebral arteries are patent without significant stenosis. No M2 proximal branch occlusion is identified. Atherosclerotic irregularity of the M2 and more distal MCA branches bilaterally, most notably as follows. Moderate stenosis at the origin of a superior division M2 left MCA branch (series 8, image 24). Moderate focal stenosis within a superior division mid to distal M2 left MCA branch (series 9, image 36). Additionally, there are multifocal stenoses within a proximal to mid inferior division left M2 MCA branch with sites of up to severe stenosis (series 9, image 37). Multifocal moderate/severe stenoses within mid to distal division right M2 MCA branches. The anterior cerebral arteries are patent bilaterally. Atherosclerotic irregularity of both vessels. Additionally, there are apparent multifocal high-grade stenoses within the A2/A3 left  anterior cerebral artery. No intracranial aneurysm is identified. Posterior circulation: The intracranial vertebral arteries are patent. Calcified plaque within both vessels. Redemonstrated moderate/severe stenosis within the V4 right vertebral artery. Mild stenosis of the V4 left vertebral artery. The basilar artery is patent. The posterior cerebral arteries are patent bilaterally. Atherosclerotic irregularity of both vessels. Most notably, there is a high-grade stenosis within the P2 left posterior cerebral artery (series 9, image 35). Posterior cerebral arteries are hypoplastic or absent bilaterally. Venous sinuses: Poorly assessed due to contrast bolus timing. Anatomic variants: As described Review of the MIP images confirms the above findings No evidence of emergent large vessel occlusion. These results were communicated to DR. XU At 5:59 pmon 8/9/2021by text page via the [facility] messaging system. IMPRESSION: CTA neck: 1. Streak artifact from a dense left-sided contrast bolus limits evaluation of the CCA and the left vertebral artery origin and V1 segment. Within this limitation, the bilateral common carotid, internal carotid and vertebral arteries are patent within the neck without appreciable hemodynamically significant stenosis. 2. Ascending thoracic aortic aneurysm measuring 4.4 cm in diameter. Recommend annual imaging followup by CTA or MRA. This recommendation follows 2010 ACCF/AHA/AATS/ACR/ASA/SCA/SCAI/SIR/STS/SVM Guidelines for the Diagnosis and Management of Patients with Thoracic Aortic Disease. Circulation. 2010; 121: O294-T654. Aortic aneurysm NOS (ICD10-I71.9) CTA head: 1. A suboptimal intracranial contrast bolus limits evaluation. 2. No intracranial large vessel occlusion. 3. Intracranial atherosclerotic disease with multifocal stenoses, most notably as follows. 4. Moderate to moderately severe stenosis of the cavernous right internal carotid artery. 5. Moderate stenosis at the origin of a superior  division M2 left MCA branch. 6. Moderate focal stenosis within a superior division mid to distal M2 left MCA branch. 7. Multifocal stenoses within a proximal to mid inferior division left M2 MCA branch with sites of up to severe stenosis. 8. Multifocal moderate/severe stenoses within mid to distal superior division right M2 MCA branches. 9. Sites of apparent high-grade stenosis within the A2/A3 left anterior cerebral artery. 10. Moderate stenosis within the V4 right vertebral artery. 11. Apparent high-grade stenosis within the P2 left PCA. 12. Some of the above described stenoses may be accentuated on the  current examination due to the suboptimal contrast bolus. Electronically Signed   By: Kellie Simmering DO   On: 08/03/2020 17:59   CT HEAD WO CONTRAST  Result Date: 08/04/2020 CLINICAL DATA:  Follow-up stroke.  Patient unable to tolerate MRI. EXAM: CT HEAD WITHOUT CONTRAST TECHNIQUE: Contiguous axial images were obtained from the base of the skull through the vertex without intravenous contrast. COMPARISON:  08/03/2020 FINDINGS: Brain: No evidence of acute infarction, hemorrhage, extra-axial collection, ventriculomegaly, or mass effect. Old left basal ganglia lacunar infarct. Generalized cerebral atrophy. Periventricular white matter low attenuation likely secondary to microangiopathy. Vascular: Cerebrovascular atherosclerotic calcifications are noted. Skull: Negative for fracture or focal lesion. Sinuses/Orbits: Visualized portions of the orbits are unremarkable. Visualized portions of the paranasal sinuses are unremarkable. Visualized portions of the mastoid air cells are unremarkable. Other: None. IMPRESSION: 1. No acute intracranial pathology. 2. Chronic microvascular disease and cerebral atrophy. Electronically Signed   By: Kathreen Devoid   On: 08/04/2020 11:46   CT ANGIO NECK W OR WO CONTRAST  Result Date: 08/03/2020 CLINICAL DATA:  Stroke/TIA, assess intracranial arteries. Right-sided weakness. EXAM: CT  ANGIOGRAPHY HEAD AND NECK TECHNIQUE: Multidetector CT imaging of the head and neck was performed using the standard protocol during bolus administration of intravenous contrast. Multiplanar CT image reconstructions and MIPs were obtained to evaluate the vascular anatomy. Carotid stenosis measurements (when applicable) are obtained utilizing NASCET criteria, using the distal internal carotid diameter as the denominator. CONTRAST:  10mL OMNIPAQUE IOHEXOL 350 MG/ML SOLN COMPARISON:  Noncontrast head CT 08/03/2020, CT angiogram head/neck 01/28/2019 FINDINGS: CTA NECK FINDINGS Aortic arch: Ascending thoracic aortic aneurysm measuring up to 4.4 cm in diameter. Atherosclerotic plaque within the visualized aortic arch and proximal major branch vessels of the neck. No hemodynamically significant innominate or proximal subclavian artery stenosis. Right carotid system: CCA and ICA patent within the neck without significant stenosis (50% or greater). Mild atherosclerotic plaque within the carotid bifurcation. Left carotid system: Streak artifact from a dense left-sided contrast bolus limits evaluation of the CCA. Within this limitation, the CCA and ICA are patent within the neck without appreciable hemodynamically significant stenosis. Mild calcified plaque within the carotid bifurcation. Vertebral arteries: Streak artifact from a dense left-sided contrast bolus limits evaluation of the left vertebral artery origin and V1 segment. Within this limitation, the vertebral arteries are patent within the neck without appreciable hemodynamically significant stenosis. Skeleton: No acute bony abnormality or aggressive osseous lesion. Cervical spondylosis. Other neck: No cervical lymphadenopathy.  Thyroid unremarkable. Upper chest: No consolidation within the imaged lung apices. Review of the MIP images confirms the above findings CTA HEAD FINDINGS Anterior circulation: Evaluation of the intracranial arterial circulation is limited due  to a suboptimal contrast bolus. The intracranial internal carotid arteries are patent. Calcified plaque within both vessels. Redemonstrated moderate to moderately severe stenosis of the cavernous right ICA. The M1 middle cerebral arteries are patent without significant stenosis. No M2 proximal branch occlusion is identified. Atherosclerotic irregularity of the M2 and more distal MCA branches bilaterally, most notably as follows. Moderate stenosis at the origin of a superior division M2 left MCA branch (series 8, image 24). Moderate focal stenosis within a superior division mid to distal M2 left MCA branch (series 9, image 36). Additionally, there are multifocal stenoses within a proximal to mid inferior division left M2 MCA branch with sites of up to severe stenosis (series 9, image 37). Multifocal moderate/severe stenoses within mid to distal division right M2 MCA branches. The anterior cerebral arteries are patent  bilaterally. Atherosclerotic irregularity of both vessels. Additionally, there are apparent multifocal high-grade stenoses within the A2/A3 left anterior cerebral artery. No intracranial aneurysm is identified. Posterior circulation: The intracranial vertebral arteries are patent. Calcified plaque within both vessels. Redemonstrated moderate/severe stenosis within the V4 right vertebral artery. Mild stenosis of the V4 left vertebral artery. The basilar artery is patent. The posterior cerebral arteries are patent bilaterally. Atherosclerotic irregularity of both vessels. Most notably, there is a high-grade stenosis within the P2 left posterior cerebral artery (series 9, image 35). Posterior cerebral arteries are hypoplastic or absent bilaterally. Venous sinuses: Poorly assessed due to contrast bolus timing. Anatomic variants: As described Review of the MIP images confirms the above findings No evidence of emergent large vessel occlusion. These results were communicated to DR. XU At 5:59 pmon 8/9/2021by  text page via the [facility] messaging system. IMPRESSION: CTA neck: 1. Streak artifact from a dense left-sided contrast bolus limits evaluation of the CCA and the left vertebral artery origin and V1 segment. Within this limitation, the bilateral common carotid, internal carotid and vertebral arteries are patent within the neck without appreciable hemodynamically significant stenosis. 2. Ascending thoracic aortic aneurysm measuring 4.4 cm in diameter. Recommend annual imaging followup by CTA or MRA. This recommendation follows 2010 ACCF/AHA/AATS/ACR/ASA/SCA/SCAI/SIR/STS/SVM Guidelines for the Diagnosis and Management of Patients with Thoracic Aortic Disease. Circulation. 2010; 121: Z610-R604. Aortic aneurysm NOS (ICD10-I71.9) CTA head: 1. A suboptimal intracranial contrast bolus limits evaluation. 2. No intracranial large vessel occlusion. 3. Intracranial atherosclerotic disease with multifocal stenoses, most notably as follows. 4. Moderate to moderately severe stenosis of the cavernous right internal carotid artery. 5. Moderate stenosis at the origin of a superior division M2 left MCA branch. 6. Moderate focal stenosis within a superior division mid to distal M2 left MCA branch. 7. Multifocal stenoses within a proximal to mid inferior division left M2 MCA branch with sites of up to severe stenosis. 8. Multifocal moderate/severe stenoses within mid to distal superior division right M2 MCA branches. 9. Sites of apparent high-grade stenosis within the A2/A3 left anterior cerebral artery. 10. Moderate stenosis within the V4 right vertebral artery. 11. Apparent high-grade stenosis within the P2 left PCA. 12. Some of the above described stenoses may be accentuated on the current examination due to the suboptimal contrast bolus. Electronically Signed   By: Kellie Simmering DO   On: 08/03/2020 17:59   DG Chest Port 1 View  Result Date: 08/03/2020 CLINICAL DATA:  Code stroke, fever EXAM: PORTABLE CHEST 1 VIEW COMPARISON:   01/08/2020 FINDINGS: Cardiomegaly, tortuous/ectatic aorta. No confluent airspace opacities, effusions or edema. No acute bony abnormality. IMPRESSION: Cardiomegaly.  No active disease. Electronically Signed   By: Rolm Baptise M.D.   On: 08/03/2020 17:31   EEG adult  Result Date: 08/04/2020 Lora Havens, MD     08/04/2020  8:32 AM Patient Name: Aquil Duhe MRN: [id number] Epilepsy Attending: Lora Havens Referring Physician/Provider: Dr. Rosalin Hawking Date: 08/03/2020 Duration: 27.50 mins Patient history: 69 year old male with history of seizure like episodes who presented with right-sided facial droop, leaning towards the right, drooling and being nonverbal.  EEG to evaluate for seizures. Level of alertness: Awake AEDs during EEG study: None Technical aspects: This EEG study was done with scalp electrodes positioned according to the 10-20 International system of electrode placement. Electrical activity was acquired at a sampling rate of 500Hz  and reviewed with a high frequency filter of 70Hz  and a low frequency filter of 1Hz . EEG data were recorded continuously and digitally  stored. Description: No posterior dominant rhythm was seen.  EEG showed continuous generalized predominantly 2 to 3 Hz delta slowing as well as intermittent 5 to 6 Hz theta slowing.  Hyperventilation and photic stimulation were not performed.   Of note, EEG was technically difficult due to significant movement artifact. ABNORMALITY -Continuous slow, generalized IMPRESSION: This technically difficult study is suggestive of moderate diffuse encephalopathy, nonspecific to etiology.  No seizures or epileptiform discharges were seen throughout the recording. Lora Havens   CT HEAD CODE STROKE WO CONTRAST  Result Date: 08/03/2020 CLINICAL DATA:  Code stroke. Neuro deficit, acute, stroke suspected. Additional history provided: Right-sided weakness. EXAM: CT HEAD WITHOUT CONTRAST TECHNIQUE: Contiguous axial images were obtained from the  base of the skull through the vertex without intravenous contrast. COMPARISON:  Head CT 01/08/2020, brain MRI 01/28/2019. FINDINGS: Brain: Stable moderate to advanced generalized parenchymal atrophy. 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 As before, there is advanced patchy and confluent hypodensity within the cerebral white matter and pons which is nonspecific, but consistent with chronic small vessel ischemic disease. There is no acute intracranial hemorrhage. No acute demarcated cortical infarct is identified. No extra-axial fluid collection. No evidence of intracranial mass. No midline shift. Vascular: No hyperdense vessel.  Atherosclerotic calcifications Skull: Normal. Negative for fracture or focal lesion. Sinuses/Orbits: Visualized orbits show no acute finding. No significant paranasal sinus disease or mastoid effusion at the imaged levels. ASPECTS ([place] Stroke Program Early CT Score) - Ganglionic level infarction (caudate, lentiform nuclei, internal capsule, insula, M1-M3 cortex): 7 - Supraganglionic infarction (M4-M6 cortex): 3 Total score (0-10 with 10 being normal): 10 Other: 1.6 cm anterior right scalp lipoma (series 3, image 14). Stroke code: [name]. No evidence of acute intracranial abnormality on non-contrast head CT. Kellie Simmering) [phone]). IMPRESSION: No CT evidence of acute intracranial abnormality. 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 Stable moderate/advanced generalized parenchymal atrophy and advanced chronic small vessel ischemic disease. Electronically Signed   By: Kellie Simmering DO   On: 08/03/2020 16:59     Labs:   Basic Metabolic Panel: Recent Labs  Lab 08/03/20 1641 08/03/20 1641 08/03/20 1648 08/03/20 1648 08/03/20 2108 08/06/20 1100  NA 144  --  146*  --  146* 147*  K 3.8   < > 3.6   < > 4.6 4.5  CL 108  --  110  --   --  113*  CO2 24  --   --   --   --  23  GLUCOSE 102*   --  97  --   --  103*  BUN 34*  --  33*  --   --  15  CREATININE 1.51*  --  1.40*  --   --  1.03  CALCIUM 9.1  --   --   --   --  9.1   < > = values in this interval not displayed.   GFR Estimated Creatinine Clearance: 72.2 mL/min (by C-G formula based on SCr of 1.03 mg/dL). Liver Function Tests: Recent Labs  Lab 08/03/20 1641  AST 62*  ALT 40  ALKPHOS 50  BILITOT 0.8  PROT 7.3  ALBUMIN 2.9*   No results for input(s): LIPASE, AMYLASE in the last 168 hours. Recent Labs  Lab 08/03/20 2038  AMMONIA 31   Coagulation profile Recent Labs  Lab 08/03/20 1641  INR 1.3*    CBC: Recent Labs  Lab 08/03/20 1641 08/03/20 1648 08/03/20 2108 08/06/20 1100  WBC 9.1  --   --  7.1  NEUTROABS 7.3  --   --  5.1  HGB 10.7* 11.9* 12.6* 10.1*  HCT 36.1* 35.0* 37.0* 33.9*  MCV 90.0  --   --  89.7  PLT 335  --   --  384   Cardiac Enzymes: No results for input(s): CKTOTAL, CKMB, CKMBINDEX, TROPONINI in the last 168 hours. BNP: Invalid input(s): POCBNP CBG: Recent Labs  Lab 08/05/20 1115 08/05/20 1643 08/05/20 1942 08/06/20 0040 08/06/20 0355  GLUCAP 87 94 90 87 84   D-Dimer No results for input(s): DDIMER in the last 72 hours. Hgb A1c No results for input(s): HGBA1C in the last 72 hours. Lipid Profile No results for input(s): CHOL, HDL, LDLCALC, TRIG, CHOLHDL, LDLDIRECT in the last 72 hours. Thyroid function studies No results for input(s): TSH, T4TOTAL, T3FREE, THYROIDAB in the last 72 hours.  Invalid input(s): FREET3 Anemia work up No results for input(s): VITAMINB12, FOLATE, FERRITIN, TIBC, IRON, RETICCTPCT in the last 72 hours. Microbiology Recent Results (from the past 240 hour(s))  SARS Coronavirus 2 by RT PCR (hospital order, performed in [facility] hospital lab) Nasopharyngeal Nasopharyngeal Swab     Status: None   Collection Time: 08/03/20  5:44 PM   Specimen: Nasopharyngeal Swab  Result Value Ref Range Status   SARS Coronavirus 2 NEGATIVE NEGATIVE Final      Comment: (NOTE) SARS-CoV-2 target nucleic acids are NOT DETECTED.  The SARS-CoV-2 RNA is generally detectable in upper and lower respiratory specimens during the acute phase of infection. The lowest concentration of SARS-CoV-2 viral copies this assay can detect is 250 copies / mL. A negative result does not preclude SARS-CoV-2 infection and should not be used as the sole basis for treatment or other patient management decisions.  A negative result may occur with improper specimen collection / handling, submission of specimen other than nasopharyngeal swab, presence of viral mutation(s) within the areas targeted by this assay, and inadequate number of viral copies (<250 copies / mL). A negative result must be combined with clinical observations, patient history, and epidemiological information.  Fact Sheet for Patients:   StrictlyIdeas.no  Fact Sheet for Healthcare Providers: BankingDealers.co.za  This test is not yet approved or  cleared by the [place] FDA and has been authorized for detection and/or diagnosis of SARS-CoV-2 by FDA under an Emergency Use Authorization (EUA).  This EUA will remain in effect (meaning this test can be used) for the duration of the COVID-19 declaration under Section 564(b)(1) of the Act, 21 U.S.C. section 360bbb-3(b)(1), unless the authorization is terminated or revoked sooner.  Performed at [facility], [place] [address] [place], [place] [address]   Blood culture (routine x 2)     Status: None (Preliminary result)   Collection Time: 08/03/20  5:44 PM   Specimen: BLOOD  Result Value Ref Range Status   Specimen Description BLOOD LEFT ANTECUBITAL  Final   Special Requests   Final    BOTTLES DRAWN AEROBIC AND ANAEROBIC Blood Culture adequate volume   Culture   Final    NO GROWTH 4 DAYS Performed at [facility], [place] [address] [place],  [address]    Report Status PENDING   Incomplete  Blood culture (routine x 2)     Status: None (Preliminary result)   Collection Time: 08/03/20  6:45 PM   Specimen: BLOOD RIGHT ARM  Result Value Ref Range Status   Specimen Description BLOOD RIGHT ARM  Final  Special Requests   Final    BOTTLES DRAWN AEROBIC AND ANAEROBIC Blood Culture adequate volume   Culture   Final    NO GROWTH 4 DAYS Performed at [facility], [place] [address], [place], [place] [address]    Report Status PENDING  Incomplete  Culture, Urine     Status: Abnormal   Collection Time: 08/03/20 10:04 PM   Specimen: Urine, Random  Result Value Ref Range Status   Specimen Description URINE, RANDOM  Final   Special Requests   Final    ADDED 0019 08/04/2020 Performed at [facility], [place] [address] [place], [place] [address]    Culture MULTIPLE SPECIES PRESENT, SUGGEST RECOLLECTION (A)  Final   Report Status 08/04/2020 FINAL  Final  MRSA PCR Screening     Status: Abnormal   Collection Time: 08/05/20  5:15 PM   Specimen: Nasopharyngeal  Result Value Ref Range Status   MRSA by PCR POSITIVE (A) NEGATIVE Final    Comment:        The GeneXpert MRSA Assay (FDA approved for NASAL specimens only), is one component of a comprehensive MRSA colonization surveillance program. It is not intended to diagnose MRSA infection nor to guide or monitor treatment for MRSA infections. RESULT CALLED TO, READ BACK BY AND VERIFIED WITH: A ABIDOYE RN 2037 08/05/20 A BROWNING Performed at [facility], [place] [address] [place], [place] [address]   SARS Coronavirus 2 by RT PCR (hospital order, performed in [facility] hospital lab) Nasopharyngeal Nasopharyngeal Swab     Status: None   Collection Time: 08/06/20  6:36 PM   Specimen: Nasopharyngeal Swab  Result Value Ref Range Status   SARS Coronavirus 2 NEGATIVE NEGATIVE Final    Comment: (NOTE) SARS-CoV-2 target nucleic acids are NOT DETECTED.  The SARS-CoV-2 RNA is generally detectable in upper and  lower respiratory specimens during the acute phase of infection. The lowest concentration of SARS-CoV-2 viral copies this assay can detect is 250 copies / mL. A negative result does not preclude SARS-CoV-2 infection and should not be used as the sole basis for treatment or other patient management decisions.  A negative result may occur with improper specimen collection / handling, submission of specimen other than nasopharyngeal swab, presence of viral mutation(s) within the areas targeted by this assay, and inadequate number of viral copies (<250 copies / mL). A negative result must be combined with clinical observations, patient history, and epidemiological information.  Fact Sheet for Patients:   StrictlyIdeas.no  Fact Sheet for Healthcare Providers: BankingDealers.co.za  This test is not yet approved or  cleared by the [place] FDA and has been authorized for detection and/or diagnosis of SARS-CoV-2 by FDA under an Emergency Use Authorization (EUA).  This EUA will remain in effect (meaning this test can be used) for the duration of the COVID-19 declaration under Section 564(b)(1) of the Act, 21 U.S.C. section 360bbb-3(b)(1), unless the authorization is terminated or revoked sooner.  Performed at [facility], [place] [address], [place], [place] [address]      Signed: Terrilee Croak  [facility] 08/07/2020, 11:12 AM

## 2020-08-07 NOTE — Progress Notes (Signed)
Patient being discharged to [facility] in [place].  Patient to be transported by [facility].  IV removed with the catheter intact. Discharge instructions and prescription information placed in the packet for discharge.

## 2020-08-07 NOTE — TOC Transition Note (Signed)
Transition of Care [facility]) - CM/SW Discharge Note   Patient Details  Name: Anson Peddie MRN: [id number] Date of Birth: 10/21/1951  [facility]) CM/SW Contact:  Geralynn Ochs, LCSW Phone Number: 08/07/2020, 12:17 PM   Clinical Narrative:   Nurse to call report to [phone], Room 101    Final next level of care: Skilled Nursing Facility Barriers to Discharge: Barriers Resolved   Patient Goals and CMS Choice   CMS Medicare.gov Compare Post Acute Care list provided to:: Patient Represenative (must comment) Choice offered to / list presented to :  (niece)  Discharge Placement              Patient chooses bed at:  ([place]) Patient to be transferred to facility by: [place] Name of family member notified: Attempted to call both niece and sister; no answer and unable to leave a message Patient and family notified of of transfer: 08/07/20  Discharge Plan and Services In-house Referral: Clinical Social Work Discharge Planning Services: [employer] Consult Post Acute Care Choice: [place]                               Social Determinants of Health (SDOH) Interventions     Readmission Risk Interventions No flowsheet data found.

## 2020-08-07 NOTE — Progress Notes (Signed)
Physical Therapy Treatment Patient Details Name: Samuel Shelton MRN: [id number] DOB: 10/12/1951 Today's Date: 08/07/2020    History of Present Illness 69 yo male with onset of R side weakness and is non verbal, unsure of the PLOF.  Has encephalopathy on EEG, cardiomegaly.  Has been in SNF previously in [facility] for mobility, not clear if pt was able to do standing transfers.  PMHx:  atherosclerosis, Covid 42, HTN, peg tube, intellectual disability, CVA, prostate CA,     PT Comments    Patient with limited mobility due to fear and soiled with BM in bed.  Eventually needed +3 A for successful completion of hygiene tasks with rolling in the bed.  He latches onto bed clothing and railings when not understanding directions, fearful and likely cold.  He nods that he understands commands, but seems that fear/self protection instincts override ability to follow commands at times.  Feel given another session when he isn't soiled he may participate more and be less fearful.  Continue to recommend SNF level rehab at d/c.   Follow Up Recommendations  SNF     Equipment Recommendations  None recommended by PT    Recommendations for Other Services       Precautions / Restrictions Precautions Precautions: Fall Precaution Comments: grabs on tightly    Mobility  Bed Mobility Overal bed mobility: Needs Assistance Bed Mobility: Rolling Rolling: Total assist;+2 for physical assistance         General bed mobility comments: +2-3 for rolling for hygiene in the bed, not safe for transfer/OOB unless via lift  Transfers                    Ambulation/Gait                 Stairs             Wheelchair Mobility    Modified Rankin (Stroke Patients Only)       Balance                                            Cognition Arousal/Alertness: Awake/alert Behavior During Therapy: Flat affect Overall Cognitive Status: No family/caregiver present to determine  baseline cognitive functioning                                 General Comments: only verbal when requesting type of juice RN asked "Orange"  max cues for not pulling up covers and for mobility in the bed due to soiled with BM and urine; tightly holds rail or therapist's arm and pulled isolation gown off (fearful and confused, not combative)      Exercises Other Exercises Other Exercises: lifted L leg for A with donning socks in supine, did not lift R leg    General Comments General comments (skin integrity, edema, etc.): Patient soiled in bed with BM and urine, initiated hygiene with +1 A but unable since pt resisting so NT in to A and eventually called RN to assist as well      Pertinent Vitals/Pain Faces Pain Scale: No hurt    Home Living                      Prior Function            PT  Goals (current goals can now be found in the care plan section) Progress towards PT goals: Not progressing toward goals - comment    Frequency    Min 2X/week      PT Plan Current plan remains appropriate    Co-evaluation              AM-PAC PT "6 Clicks" Mobility   Outcome Measure  Help needed turning from your back to your side while in a flat bed without using bedrails?: Total Help needed moving from lying on your back to sitting on the side of a flat bed without using bedrails?: Total Help needed moving to and from a bed to a chair (including a wheelchair)?: Total Help needed standing up from a chair using your arms (e.g., wheelchair or bedside chair)?: Total Help needed to walk in hospital room?: Total Help needed climbing 3-5 steps with a railing? : Total 6 Click Score: 6    End of Session   Activity Tolerance: Other (comment) (limited by cognition) Patient left: in bed;with call bell/phone within reach   PT Visit Diagnosis: Other abnormalities of gait and mobility (R26.89);Other symptoms and signs involving the nervous system (R29.898)      Time: 3435-6861 PT Time Calculation (min) (ACUTE ONLY): 24 min  Charges:  $Therapeutic Activity: 23-37 mins                     Magda Kiel, PT Acute Rehabilitation Services UOHFG:[phone] Office:[phone] 08/07/2020    Reginia Naas 08/07/2020, 12:41 PM

## 2020-08-07 NOTE — Care Management Important Message (Signed)
Important Message  Patient Details  Name: Samuel Shelton MRN: [id number] Date of Birth: 1951-09-30   Medicare Important Message Given:  Yes  Due to national emergency.  Cakked the patient  Or representative explained the IM and received  Verbal acknowledgment.   Shatara Stanek 08/07/2020, 1:26 PM

## 2020-08-08 LAB — CULTURE, BLOOD (ROUTINE X 2)
Culture: NO GROWTH
Culture: NO GROWTH
Special Requests: ADEQUATE
Special Requests: ADEQUATE

## 2020-08-20 ENCOUNTER — Ambulatory Visit (HOSPITAL_COMMUNITY)
Admission: RE | Admit: 2020-08-20 | Discharge: 2020-08-20 | Disposition: A | Discharge status: Home / Self Care | Payer: Medicare Other | Source: Ambulatory Visit | Attending: Family | Admitting: Family

## 2020-08-20 ENCOUNTER — Other Ambulatory Visit (HOSPITAL_COMMUNITY): Payer: Self-pay | Admitting: Family

## 2020-08-20 ENCOUNTER — Other Ambulatory Visit: Payer: Self-pay

## 2020-08-20 DIAGNOSIS — R1310 Dysphagia, unspecified: Secondary | ICD-10-CM | POA: Diagnosis not present

## 2020-08-20 DIAGNOSIS — Z431 Encounter for attention to gastrostomy: Secondary | ICD-10-CM | POA: Insufficient documentation

## 2020-08-20 DIAGNOSIS — Z8673 Personal history of transient ischemic attack (TIA), and cerebral infarction without residual deficits: Secondary | ICD-10-CM | POA: Diagnosis not present

## 2020-08-20 HISTORY — PX: IR REPLC GASTRO/COLONIC TUBE PERCUT W/FLUORO: IMG2333

## 2020-08-20 MED ORDER — IOHEXOL 300 MG/ML  SOLN: 50.0000 mL | Freq: Once | INTRAMUSCULAR | Status: DC | PRN

## 2020-08-20 MED ORDER — LIDOCAINE VISCOUS HCL 2 % MT SOLN
OROMUCOSAL | Status: AC
  Filled 2020-08-20: qty 15

## 2020-08-20 NOTE — Procedures (Signed)
Interventional Radiology Procedure Note  Procedure:    Attempt at perc G replacement.   There is no identifiable tract to attempt replacement.   Complications: None  Recommendations:  - If G tube is required, then new referral for fresh Endoscopic/Surgical/Imaging guided g-tube would be recommended.   Signed,  Dulcy Fanny. Earleen Newport, DO

## 2020-08-20 NOTE — Progress Notes (Signed)
Patient presents for gastrostomy tube replacement.  Spoke with Alonza Bogus, NP at [facility] who reports patient with cognitive changes after stroke this past year.  He is able to eat and drink, however his intake is inconsistent and at times insufficient to meet needs or refuses to take medications orally. His G-tube fell out yesterday around 4pm and request is made for replacement. There is nothing in the tract today.  Discussed with both NP as well as patient's legal guardian [name].  Plan to replace tube by cannulating tract, if able.  If unable, patient will need to rescheduled for new placement with sedation.  He has not been NPO today-- ate breakfast, drank supplements, and took medications.   Risks and benefits image guided gastrostomy tube placement was discussed with the patient including, but not limited to the need for a barium enema during the procedure, bleeding, infection, peritonitis and/or damage to adjacent structures.  All of the patient's questions were answered, patient is agreeable to proceed.  Consent signed and in chart.   Brynda Greathouse, MS RD PA-C

## 2020-08-25 ENCOUNTER — Other Ambulatory Visit (HOSPITAL_COMMUNITY): Payer: Self-pay | Admitting: Family

## 2020-08-25 DIAGNOSIS — R1310 Dysphagia, unspecified: Secondary | ICD-10-CM

## 2020-08-26 ENCOUNTER — Other Ambulatory Visit: Payer: Self-pay | Admitting: Student

## 2020-08-27 ENCOUNTER — Other Ambulatory Visit (HOSPITAL_COMMUNITY): Payer: Self-pay | Admitting: Family

## 2020-08-27 ENCOUNTER — Encounter (HOSPITAL_COMMUNITY): Payer: Self-pay

## 2020-08-27 ENCOUNTER — Other Ambulatory Visit: Payer: Self-pay

## 2020-08-27 ENCOUNTER — Ambulatory Visit (HOSPITAL_COMMUNITY)
Admission: RE | Admit: 2020-08-27 | Discharge: 2020-08-27 | Disposition: A | Discharge status: Home / Self Care | Payer: Medicare Other | Source: Ambulatory Visit | Attending: Family | Admitting: Family

## 2020-08-27 DIAGNOSIS — Z8616 Personal history of COVID-19: Secondary | ICD-10-CM | POA: Insufficient documentation

## 2020-08-27 DIAGNOSIS — Z8673 Personal history of transient ischemic attack (TIA), and cerebral infarction without residual deficits: Secondary | ICD-10-CM | POA: Insufficient documentation

## 2020-08-27 DIAGNOSIS — I10 Essential (primary) hypertension: Secondary | ICD-10-CM | POA: Diagnosis not present

## 2020-08-27 DIAGNOSIS — Z8546 Personal history of malignant neoplasm of prostate: Secondary | ICD-10-CM | POA: Diagnosis not present

## 2020-08-27 DIAGNOSIS — F79 Unspecified intellectual disabilities: Secondary | ICD-10-CM | POA: Diagnosis not present

## 2020-08-27 DIAGNOSIS — R1310 Dysphagia, unspecified: Secondary | ICD-10-CM

## 2020-08-27 DIAGNOSIS — Z87891 Personal history of nicotine dependence: Secondary | ICD-10-CM | POA: Diagnosis not present

## 2020-08-27 DIAGNOSIS — Z79899 Other long term (current) drug therapy: Secondary | ICD-10-CM | POA: Insufficient documentation

## 2020-08-27 MED ORDER — MIDAZOLAM HCL 2 MG/2ML IJ SOLN
INTRAMUSCULAR | Status: AC
  Filled 2020-08-27: qty 2

## 2020-08-27 MED ORDER — CEFAZOLIN SODIUM-DEXTROSE 2-4 GM/100ML-% IV SOLN: 2.0000 g | Freq: Once | INTRAVENOUS | Status: DC

## 2020-08-27 MED ORDER — SODIUM CHLORIDE 0.9 % IV SOLN: INTRAVENOUS | Status: DC

## 2020-08-27 MED ORDER — FENTANYL CITRATE (PF) 100 MCG/2ML IJ SOLN
INTRAMUSCULAR | Status: AC
  Filled 2020-08-27: qty 2

## 2020-08-27 MED ORDER — CEFAZOLIN SODIUM-DEXTROSE 2-4 GM/100ML-% IV SOLN
INTRAVENOUS | Status: AC
  Filled 2020-08-27: qty 100

## 2020-08-27 NOTE — Progress Notes (Signed)
Client punching at nurses and trying to bite; yelling and will not wear mask, keeps taking it off; unable to draw blood

## 2020-08-27 NOTE — Consult Note (Signed)
Chief Complaint: Patient was seen in consultation today for dysphagia, poor PO intake  Referring Physician(s): Clendenin,Whitney  Supervising Physician: Sandi Mariscal  Patient Status: [facility] - Out-pt  History of Present Illness: Samuel Shelton is a 69 y.o. male with past medical history of CVA, intellectual disability, prostate CA, HTN, Covid positive in January 2021, recurrent UTI presents to [facility] IR today for replacement of his gastrostomy tube.  Patient's tube was dislodged last week and the tract was unable to be cannulated at the NH.  By the time patient arrived in Radiology the following day for replacement, the tract had completely closed and the tube was unable to be replaced.  Unfortunately, on that day he was not NPO and had eaten a large breakfast.  Sedation was not possible.  He returns today for new placement of a percutaneous gastrostomy tube.   Patient assessed in short stay. He is alert, cooperative. Niece at bedside.  Facility called this AM and reported miscommunication with barium administration.  The barium was not given overnight.  Patient NPO this AM.  This was discussed with Dr. Pascal Lux who is agreeable to proceed with attempt.  Niece at bedside reports patient does not eat or drink enough to sustain nutrition and hydration.  Recounts recent admission for dehydration and UTI.  She would like to proceed with gastrostomy tube placement on patient's behalf.   Past Medical History:  Diagnosis Date  . Chronic mental illness   . Edema of both legs   . Elevated PSA   . Elevated PSA   . Hypertension   . Mental retardation   . Phimosis     Past Surgical History:  Procedure Laterality Date  . IR REPLC GASTRO/COLONIC TUBE PERCUT W/FLUORO  08/20/2020  . PEG PLACEMENT N/A 01/24/2020   Procedure: PERCUTANEOUS ENDOSCOPIC GASTROSTOMY (PEG) PLACEMENT;  Surgeon: [name], MD;  Location: [facility] ENDOSCOPY;  Service: Endoscopy;  Laterality: N/A;  . PROSTATE BIOPSY N/A 06/22/2015    Procedure: PROSTATE BIOPSY;  Surgeon: Hollice Espy, MD;  Location: [facility] ORS;  Service: Urology;  Laterality: N/A;  . PROSTATE BIOPSY N/A 09/27/2016   Procedure: PROSTATE BIOPSY;  Surgeon: Hollice Espy, MD;  Location: [facility] ORS;  Service: Urology;  Laterality: N/A;    Allergies: Patient has no known allergies.  Medications: Prior to Admission medications   Medication Sig Start Date End Date Taking? Authorizing Provider  acetaminophen (TYLENOL) 500 MG tablet Take 500 mg by mouth every 4 (four) hours as needed for mild pain.    [provider]  aspirin 325 MG tablet Take 1 tablet (325 mg total) by mouth daily. 08/08/20   Terrilee Croak, MD  doxazosin (CARDURA) 1 MG tablet Take 1 mg by mouth daily.    [provider]  ferrous sulfate 325 (65 FE) MG tablet Take 325 mg by mouth daily with breakfast.    [provider]  finasteride (PROSCAR) 5 MG tablet Take 1 tablet (5 mg total) by mouth daily. 09/05/18   Hollice Espy, MD  pantoprazole (PROTONIX) 40 MG tablet Take 40 mg by mouth daily.    [provider]  Water For Irrigation, Sterile (FREE WATER) SOLN Place 120 mLs into feeding tube every 4 (four) hours. Patient not taking: Reported on 08/03/2020 01/28/20   Fritzi Mandes, MD     Family History  Problem Relation Age of Onset  . Stroke Mother   . Hypertension Sister   . Diabetes type II Sister     Social History   Socioeconomic History  .  Marital status: Single    Spouse name: Not on file  . Number of children: Not on file  . Years of education: Not on file  . Highest education level: Not on file  Occupational History  . Not on file  Tobacco Use  . Smoking status: Former Smoker    Packs/day: 0.50    Types: Cigarettes  . Smokeless tobacco: Never Used  Vaping Use  . Vaping Use: Never used  Substance and Sexual Activity  . Alcohol use: No    Alcohol/week: 0.0 standard drinks  . Drug use: No  . Sexual activity: Not on file  Other Topics Concern    . Not on file  Social History Narrative  . Not on file   Social Determinants of Health   Financial Resource Strain:   . Difficulty of Paying Living Expenses: Not on file  Food Insecurity:   . Worried About Charity fundraiser in the Last Year: Not on file  . Ran Out of Food in the Last Year: Not on file  Transportation Needs:   . Lack of Transportation (Medical): Not on file  . Lack of Transportation (Non-Medical): Not on file  Physical Activity:   . Days of Exercise per Week: Not on file  . Minutes of Exercise per Session: Not on file  Stress:   . Feeling of Stress : Not on file  Social Connections:   . Frequency of Communication with Friends and Family: Not on file  . Frequency of Social Gatherings with Friends and Family: Not on file  . Attends Religious Services: Not on file  . Active Member of Clubs or Organizations: Not on file  . Attends Archivist Meetings: Not on file  . Marital Status: Not on file     Review of Systems: A 12 point ROS discussed and pertinent positives are indicated in the HPI above.  All other systems are negative.  Review of Systems  Unable to perform ROS: Patient nonverbal    Vital Signs: BP (!) 144/88   Pulse 88   Temp 98.4 F (36.9 C) (Oral)   Resp 20   SpO2 100%   Physical Exam Vitals and nursing note reviewed.  Constitutional:      General: He is not in acute distress.    Appearance: Normal appearance. He is not ill-appearing.  Cardiovascular:     Rate and Rhythm: Normal rate and regular rhythm.  Pulmonary:     Effort: Pulmonary effort is normal. No respiratory distress.     Breath sounds: Normal breath sounds.  Abdominal:     General: Abdomen is flat.     Palpations: Abdomen is soft.     Tenderness: There is no abdominal tenderness.     Comments: Prior gastrostomy tube site intact with granulation tissue.  Closed/healed.   Skin:    General: Skin is warm and dry.  Neurological:     General: No focal deficit  present.     Mental Status: He is alert and oriented to person, place, and time. Mental status is at baseline.  Psychiatric:        Mood and Affect: Mood normal.        Behavior: Behavior normal.        Thought Content: Thought content normal.        Judgment: Judgment normal.      MD Evaluation Airway: WNL Heart: WNL Abdomen: WNL Chest/ Lungs: WNL ASA  Classification: 3 Mallampati/Airway Score: Two   Imaging: CT  ANGIO HEAD W OR WO CONTRAST  Result Date: 08/03/2020 CLINICAL DATA:  Stroke/TIA, assess intracranial arteries. Right-sided weakness. EXAM: CT ANGIOGRAPHY HEAD AND NECK TECHNIQUE: Multidetector CT imaging of the head and neck was performed using the standard protocol during bolus administration of intravenous contrast. Multiplanar CT image reconstructions and MIPs were obtained to evaluate the vascular anatomy. Carotid stenosis measurements (when applicable) are obtained utilizing NASCET criteria, using the distal internal carotid diameter as the denominator. CONTRAST:  45mL OMNIPAQUE IOHEXOL 350 MG/ML SOLN COMPARISON:  Noncontrast head CT 08/03/2020, CT angiogram head/neck 01/28/2019 FINDINGS: CTA NECK FINDINGS Aortic arch: Ascending thoracic aortic aneurysm measuring up to 4.4 cm in diameter. Atherosclerotic plaque within the visualized aortic arch and proximal major branch vessels of the neck. No hemodynamically significant innominate or proximal subclavian artery stenosis. Right carotid system: CCA and ICA patent within the neck without significant stenosis (50% or greater). Mild atherosclerotic plaque within the carotid bifurcation. Left carotid system: Streak artifact from a dense left-sided contrast bolus limits evaluation of the CCA. Within this limitation, the CCA and ICA are patent within the neck without appreciable hemodynamically significant stenosis. Mild calcified plaque within the carotid bifurcation. Vertebral arteries: Streak artifact from a dense left-sided contrast  bolus limits evaluation of the left vertebral artery origin and V1 segment. Within this limitation, the vertebral arteries are patent within the neck without appreciable hemodynamically significant stenosis. Skeleton: No acute bony abnormality or aggressive osseous lesion. Cervical spondylosis. Other neck: No cervical lymphadenopathy.  Thyroid unremarkable. Upper chest: No consolidation within the imaged lung apices. Review of the MIP images confirms the above findings CTA HEAD FINDINGS Anterior circulation: Evaluation of the intracranial arterial circulation is limited due to a suboptimal contrast bolus. The intracranial internal carotid arteries are patent. Calcified plaque within both vessels. Redemonstrated moderate to moderately severe stenosis of the cavernous right ICA. The M1 middle cerebral arteries are patent without significant stenosis. No M2 proximal branch occlusion is identified. Atherosclerotic irregularity of the M2 and more distal MCA branches bilaterally, most notably as follows. Moderate stenosis at the origin of a superior division M2 left MCA branch (series 8, image 24). Moderate focal stenosis within a superior division mid to distal M2 left MCA branch (series 9, image 36). Additionally, there are multifocal stenoses within a proximal to mid inferior division left M2 MCA branch with sites of up to severe stenosis (series 9, image 37). Multifocal moderate/severe stenoses within mid to distal division right M2 MCA branches. The anterior cerebral arteries are patent bilaterally. Atherosclerotic irregularity of both vessels. Additionally, there are apparent multifocal high-grade stenoses within the A2/A3 left anterior cerebral artery. No intracranial aneurysm is identified. Posterior circulation: The intracranial vertebral arteries are patent. Calcified plaque within both vessels. Redemonstrated moderate/severe stenosis within the V4 right vertebral artery. Mild stenosis of the V4 left vertebral  artery. The basilar artery is patent. The posterior cerebral arteries are patent bilaterally. Atherosclerotic irregularity of both vessels. Most notably, there is a high-grade stenosis within the P2 left posterior cerebral artery (series 9, image 35). Posterior cerebral arteries are hypoplastic or absent bilaterally. Venous sinuses: Poorly assessed due to contrast bolus timing. Anatomic variants: As described Review of the MIP images confirms the above findings No evidence of emergent large vessel occlusion. These results were communicated to DR. XU At 5:59 pmon 8/9/2021by text page via the [facility] messaging system. IMPRESSION: CTA neck: 1. Streak artifact from a dense left-sided contrast bolus limits evaluation of the CCA and the left vertebral artery origin and V1  segment. Within this limitation, the bilateral common carotid, internal carotid and vertebral arteries are patent within the neck without appreciable hemodynamically significant stenosis. 2. Ascending thoracic aortic aneurysm measuring 4.4 cm in diameter. Recommend annual imaging followup by CTA or MRA. This recommendation follows 2010 ACCF/AHA/AATS/ACR/ASA/SCA/SCAI/SIR/STS/SVM Guidelines for the Diagnosis and Management of Patients with Thoracic Aortic Disease. Circulation. 2010; 121: B353-G992. Aortic aneurysm NOS (ICD10-I71.9) CTA head: 1. A suboptimal intracranial contrast bolus limits evaluation. 2. No intracranial large vessel occlusion. 3. Intracranial atherosclerotic disease with multifocal stenoses, most notably as follows. 4. Moderate to moderately severe stenosis of the cavernous right internal carotid artery. 5. Moderate stenosis at the origin of a superior division M2 left MCA branch. 6. Moderate focal stenosis within a superior division mid to distal M2 left MCA branch. 7. Multifocal stenoses within a proximal to mid inferior division left M2 MCA branch with sites of up to severe stenosis. 8. Multifocal moderate/severe stenoses within mid to  distal superior division right M2 MCA branches. 9. Sites of apparent high-grade stenosis within the A2/A3 left anterior cerebral artery. 10. Moderate stenosis within the V4 right vertebral artery. 11. Apparent high-grade stenosis within the P2 left PCA. 12. Some of the above described stenoses may be accentuated on the current examination due to the suboptimal contrast bolus. Electronically Signed   By: Kellie Simmering DO   On: 08/03/2020 17:59   CT HEAD WO CONTRAST  Result Date: 08/04/2020 CLINICAL DATA:  Follow-up stroke.  Patient unable to tolerate MRI. EXAM: CT HEAD WITHOUT CONTRAST TECHNIQUE: Contiguous axial images were obtained from the base of the skull through the vertex without intravenous contrast. COMPARISON:  08/03/2020 FINDINGS: Brain: No evidence of acute infarction, hemorrhage, extra-axial collection, ventriculomegaly, or mass effect. Old left basal ganglia lacunar infarct. Generalized cerebral atrophy. Periventricular white matter low attenuation likely secondary to microangiopathy. Vascular: Cerebrovascular atherosclerotic calcifications are noted. Skull: Negative for fracture or focal lesion. Sinuses/Orbits: Visualized portions of the orbits are unremarkable. Visualized portions of the paranasal sinuses are unremarkable. Visualized portions of the mastoid air cells are unremarkable. Other: None. IMPRESSION: 1. No acute intracranial pathology. 2. Chronic microvascular disease and cerebral atrophy. Electronically Signed   By: Kathreen Devoid   On: 08/04/2020 11:46   CT ANGIO NECK W OR WO CONTRAST  Result Date: 08/03/2020 CLINICAL DATA:  Stroke/TIA, assess intracranial arteries. Right-sided weakness. EXAM: CT ANGIOGRAPHY HEAD AND NECK TECHNIQUE: Multidetector CT imaging of the head and neck was performed using the standard protocol during bolus administration of intravenous contrast. Multiplanar CT image reconstructions and MIPs were obtained to evaluate the vascular anatomy. Carotid stenosis  measurements (when applicable) are obtained utilizing NASCET criteria, using the distal internal carotid diameter as the denominator. CONTRAST:  44mL OMNIPAQUE IOHEXOL 350 MG/ML SOLN COMPARISON:  Noncontrast head CT 08/03/2020, CT angiogram head/neck 01/28/2019 FINDINGS: CTA NECK FINDINGS Aortic arch: Ascending thoracic aortic aneurysm measuring up to 4.4 cm in diameter. Atherosclerotic plaque within the visualized aortic arch and proximal major branch vessels of the neck. No hemodynamically significant innominate or proximal subclavian artery stenosis. Right carotid system: CCA and ICA patent within the neck without significant stenosis (50% or greater). Mild atherosclerotic plaque within the carotid bifurcation. Left carotid system: Streak artifact from a dense left-sided contrast bolus limits evaluation of the CCA. Within this limitation, the CCA and ICA are patent within the neck without appreciable hemodynamically significant stenosis. Mild calcified plaque within the carotid bifurcation. Vertebral arteries: Streak artifact from a dense left-sided contrast bolus limits evaluation of the left  vertebral artery origin and V1 segment. Within this limitation, the vertebral arteries are patent within the neck without appreciable hemodynamically significant stenosis. Skeleton: No acute bony abnormality or aggressive osseous lesion. Cervical spondylosis. Other neck: No cervical lymphadenopathy.  Thyroid unremarkable. Upper chest: No consolidation within the imaged lung apices. Review of the MIP images confirms the above findings CTA HEAD FINDINGS Anterior circulation: Evaluation of the intracranial arterial circulation is limited due to a suboptimal contrast bolus. The intracranial internal carotid arteries are patent. Calcified plaque within both vessels. Redemonstrated moderate to moderately severe stenosis of the cavernous right ICA. The M1 middle cerebral arteries are patent without significant stenosis. No M2  proximal branch occlusion is identified. Atherosclerotic irregularity of the M2 and more distal MCA branches bilaterally, most notably as follows. Moderate stenosis at the origin of a superior division M2 left MCA branch (series 8, image 24). Moderate focal stenosis within a superior division mid to distal M2 left MCA branch (series 9, image 36). Additionally, there are multifocal stenoses within a proximal to mid inferior division left M2 MCA branch with sites of up to severe stenosis (series 9, image 37). Multifocal moderate/severe stenoses within mid to distal division right M2 MCA branches. The anterior cerebral arteries are patent bilaterally. Atherosclerotic irregularity of both vessels. Additionally, there are apparent multifocal high-grade stenoses within the A2/A3 left anterior cerebral artery. No intracranial aneurysm is identified. Posterior circulation: The intracranial vertebral arteries are patent. Calcified plaque within both vessels. Redemonstrated moderate/severe stenosis within the V4 right vertebral artery. Mild stenosis of the V4 left vertebral artery. The basilar artery is patent. The posterior cerebral arteries are patent bilaterally. Atherosclerotic irregularity of both vessels. Most notably, there is a high-grade stenosis within the P2 left posterior cerebral artery (series 9, image 35). Posterior cerebral arteries are hypoplastic or absent bilaterally. Venous sinuses: Poorly assessed due to contrast bolus timing. Anatomic variants: As described Review of the MIP images confirms the above findings No evidence of emergent large vessel occlusion. These results were communicated to DR. XU At 5:59 pmon 8/9/2021by text page via the [facility] messaging system. IMPRESSION: CTA neck: 1. Streak artifact from a dense left-sided contrast bolus limits evaluation of the CCA and the left vertebral artery origin and V1 segment. Within this limitation, the bilateral common carotid, internal carotid and vertebral  arteries are patent within the neck without appreciable hemodynamically significant stenosis. 2. Ascending thoracic aortic aneurysm measuring 4.4 cm in diameter. Recommend annual imaging followup by CTA or MRA. This recommendation follows 2010 ACCF/AHA/AATS/ACR/ASA/SCA/SCAI/SIR/STS/SVM Guidelines for the Diagnosis and Management of Patients with Thoracic Aortic Disease. Circulation. 2010; 121: Z610-R604. Aortic aneurysm NOS (ICD10-I71.9) CTA head: 1. A suboptimal intracranial contrast bolus limits evaluation. 2. No intracranial large vessel occlusion. 3. Intracranial atherosclerotic disease with multifocal stenoses, most notably as follows. 4. Moderate to moderately severe stenosis of the cavernous right internal carotid artery. 5. Moderate stenosis at the origin of a superior division M2 left MCA branch. 6. Moderate focal stenosis within a superior division mid to distal M2 left MCA branch. 7. Multifocal stenoses within a proximal to mid inferior division left M2 MCA branch with sites of up to severe stenosis. 8. Multifocal moderate/severe stenoses within mid to distal superior division right M2 MCA branches. 9. Sites of apparent high-grade stenosis within the A2/A3 left anterior cerebral artery. 10. Moderate stenosis within the V4 right vertebral artery. 11. Apparent high-grade stenosis within the P2 left PCA. 12. Some of the above described stenoses may be accentuated on the current examination  due to the suboptimal contrast bolus. Electronically Signed   By: Kellie Simmering DO   On: 08/03/2020 17:59   IR Replc Gastro/Colonic Francesco Runner Percut W/Fluoro  Result Date: 08/20/2020 INDICATION: 69 year old male referred for attempt at rescue of a endoscopically placed percutaneous gastrostomy. EXAM: IMAGE GUIDED GASTROSTOMY TUBE REPLACEMENT MEDICATIONS: None ANESTHESIA/SEDATION: None CONTRAST:  None FLUOROSCOPY TIME:  Fluoroscopy Time: 0 minutes 6 seconds (1 mGy). COMPLICATIONS: None PROCEDURE: Informed written consent was  obtained from the patient's family after a thorough discussion of the procedural risks, benefits and alternatives. All questions were addressed. Maximal Sterile Barrier Technique was utilized including caps, mask, sterile gowns, sterile gloves, sterile drape, hand hygiene and skin antiseptic. A timeout was performed prior to the initiation of the procedure. Patient was positioned supine position on the fluoroscopy table. The site of the former ostomy was prepped and draped in the usual sterile fashion. Note that the patient is slightly altered in mentation, with low cooperation. The ostomy site was examined, with no leakage, drainage, and without erythema. The site of the former gastrostomy tube is quite dry. We attempted to inject contrast through the ostomy, to identify any existing drain tract. This injection of contrast was negative, with all of the contrast on the abdominal wall. The patient did not tolerate the injection well. We then withdrew from the attempted replacement. IMPRESSION: Attempt at percutaneous gastrostomy replacement confirms that the tract has been sealed with no patent tract remaining at this time. If the patient requires a gastrostomy tube in the future, a new consult will be required for new tube placement. Electronically Signed   By: Corrie Mckusick D.O.   On: 08/20/2020 15:19   DG Chest Port 1 View  Result Date: 08/03/2020 CLINICAL DATA:  Code stroke, fever EXAM: PORTABLE CHEST 1 VIEW COMPARISON:  01/08/2020 FINDINGS: Cardiomegaly, tortuous/ectatic aorta. No confluent airspace opacities, effusions or edema. No acute bony abnormality. IMPRESSION: Cardiomegaly.  No active disease. Electronically Signed   By: Rolm Baptise M.D.   On: 08/03/2020 17:31   EEG adult  Result Date: 08/04/2020 Lora Havens, MD     08/04/2020  8:32 AM Patient Name: Melik Blancett MRN: [id number] Epilepsy Attending: Lora Havens Referring Physician/Provider: Dr. Rosalin Hawking Date: 08/03/2020 Duration: 27.50  mins Patient history: 69 year old male with history of seizure like episodes who presented with right-sided facial droop, leaning towards the right, drooling and being nonverbal.  EEG to evaluate for seizures. Level of alertness: Awake AEDs during EEG study: None Technical aspects: This EEG study was done with scalp electrodes positioned according to the 10-20 International system of electrode placement. Electrical activity was acquired at a sampling rate of 500Hz  and reviewed with a high frequency filter of 70Hz  and a low frequency filter of 1Hz . EEG data were recorded continuously and digitally stored. Description: No posterior dominant rhythm was seen.  EEG showed continuous generalized predominantly 2 to 3 Hz delta slowing as well as intermittent 5 to 6 Hz theta slowing.  Hyperventilation and photic stimulation were not performed.   Of note, EEG was technically difficult due to significant movement artifact. ABNORMALITY -Continuous slow, generalized IMPRESSION: This technically difficult study is suggestive of moderate diffuse encephalopathy, nonspecific to etiology.  No seizures or epileptiform discharges were seen throughout the recording. Lora Havens   CT HEAD CODE STROKE WO CONTRAST  Result Date: 08/03/2020 CLINICAL DATA:  Code stroke. Neuro deficit, acute, stroke suspected. Additional history provided: Right-sided weakness. EXAM: CT HEAD WITHOUT CONTRAST TECHNIQUE: Contiguous axial images were  obtained from the base of the skull through the vertex without intravenous contrast. COMPARISON:  Head CT 01/08/2020, brain MRI 01/28/2019. FINDINGS: Brain: Stable moderate to advanced generalized parenchymal atrophy. 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 As before, there is advanced patchy and confluent hypodensity within the cerebral white matter and pons which is nonspecific, but consistent with chronic small vessel ischemic disease. There is no acute intracranial  hemorrhage. No acute demarcated cortical infarct is identified. No extra-axial fluid collection. No evidence of intracranial mass. No midline shift. Vascular: No hyperdense vessel.  Atherosclerotic calcifications Skull: Normal. Negative for fracture or focal lesion. Sinuses/Orbits: Visualized orbits show no acute finding. No significant paranasal sinus disease or mastoid effusion at the imaged levels. ASPECTS ([place] Stroke Program Early CT Score) - Ganglionic level infarction (caudate, lentiform nuclei, internal capsule, insula, M1-M3 cortex): 7 - Supraganglionic infarction (M4-M6 cortex): 3 Total score (0-10 with 10 being normal): 10 Other: 1.6 cm anterior right scalp lipoma (series 3, image 14). Stroke code: [name]. No evidence of acute intracranial abnormality on non-contrast head CT. Kellie Simmering) [phone]). IMPRESSION: No CT evidence of acute intracranial abnormality. 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 Stable moderate/advanced generalized parenchymal atrophy and advanced chronic small vessel ischemic disease. Electronically Signed   By: Kellie Simmering DO   On: 08/03/2020 16:59   ECHOCARDIOGRAM LIMITED  Result Date: 08/05/2020    ECHOCARDIOGRAM LIMITED REPORT   Patient Name:   MINA CARLISI Date of Exam: 08/05/2020 Medical Rec #:  [id number]       Height:       72.0 in Accession #:    [id number]      Weight:       173.1 lb Date of Birth:  1951/02/13        BSA:          2.004 m Patient Age:    74 years        BP:           119/84 mmHg Patient Gender: M               HR:           95 bpm. Exam Location:  Inpatient Procedure: Limited Echo, Limited Color Doppler and Cardiac Doppler Indications:    TIA 435.9 / G45.9  History:        Patient has prior history of Echocardiogram examinations, most                 recent 01/29/2019. Risk Factors:Hypertension. Mental retardation.  Sonographer:    Darlina Sicilian RDCS Referring Phys: [phone] ANASTASSIA DOUTOVA   Sonographer Comments: Image acquisition challenging due to uncooperative patient and Image acquisition challenging due to patient behavioral factors. IMPRESSIONS  1. Left ventricular ejection fraction, by estimation, is 55 to 60%. The left ventricle has normal function. The left ventricle has no regional wall motion abnormalities. There is mild left ventricular hypertrophy.  2. Right ventricular systolic function is normal. The right ventricular size is normal.  3. The aortic valve is tricuspid.  4. Aortic dilatation noted. 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  5. Limited echo, no doppler data. FINDINGS  Left Ventricle: Left ventricular ejection fraction, by estimation, is 55 to 60%. The left ventricle has normal function. The left ventricle has no regional wall motion abnormalities. The left ventricular internal cavity size was normal in size. There is  mild left ventricular hypertrophy. Right Ventricle: The  right ventricular size is normal. No increase in right ventricular wall thickness. Right ventricular systolic function is normal. Left Atrium: Left atrial size was normal in size. Aortic Valve: The aortic valve is tricuspid. Pulmonic Valve: The pulmonic valve was normal in structure. Pulmonic valve regurgitation is not visualized. Aorta: Aortic dilatation noted. 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 LEFT VENTRICLE PLAX 2D LVIDd:         3.80 cm LVIDs:         2.70 cm LV PW:         1.10 cm LV IVS:        1.40 cm LVOT diam:     2.40 cm LVOT Area:     4.52 cm  LEFT ATRIUM             Index LA diam:        3.70 cm 1.85 cm/m LA Vol (A2C):   54.8 ml 27.35 ml/m LA Vol (A4C):   37.5 ml 18.72 ml/m LA Biplane Vol: 49.4 ml 24.66 ml/m   AORTA Ao Root diam: 3.80 cm  SHUNTS Systemic Diam: 2.40 cm Loralie Champagne MD Electronically signed by Loralie Champagne MD Signature Date/Time: 08/05/2020/4:37:03 PM    Final     Labs:  CBC: Recent Labs    01/25/20 0449 01/25/20 0449  01/25/20 2234 01/25/20 2234 08/03/20 1641 08/03/20 1648 08/03/20 2108 08/06/20 1100  WBC 7.2  --  5.9  --  9.1  --   --  7.1  HGB 10.7*   < > 9.9*   < > 10.7* 11.9* 12.6* 10.1*  HCT 34.2*   < > 30.8*   < > 36.1* 35.0* 37.0* 33.9*  PLT 216  --  223  --  335  --   --  384   < > = values in this interval not displayed.    COAGS: Recent Labs    08/03/20 1641  INR 1.3*  APTT 36    BMP: Recent Labs    01/28/20 0503 01/28/20 0503 01/29/20 3846 01/29/20 6599 08/03/20 1641 08/03/20 1648 08/03/20 2108 08/06/20 1100  NA 140   < > 142   < > 144 146* 146* 147*  K 3.9   < > 4.1   < > 3.8 3.6 4.6 4.5  CL 108   < > 108  --  108 110  --  113*  CO2 24  --  24  --  24  --   --  23  GLUCOSE 104*   < > 108*  --  102* 97  --  103*  BUN 33*   < > 33*  --  34* 33*  --  15  CALCIUM 8.3*  --  8.7*  --  9.1  --   --  9.1  CREATININE 1.22   < > 1.43*  --  1.51* 1.40*  --  1.03  GFRNONAA 60*  --  49*  --  46*  --   --  >60  GFRAA >60  --  57*  --  53*  --   --  >60   < > = values in this interval not displayed.    LIVER FUNCTION TESTS: Recent Labs    01/10/20 2111 01/10/20 2111 01/11/20 0716 01/11/20 0716 01/15/20 0550 01/16/20 0326 01/22/20 0704 08/03/20 1641  BILITOT 1.3*  --  1.5*  --  0.9  --   --  0.8  AST 39  --  43*  --  45*  --   --  62*  ALT 27  --  24  --  31  --   --  40  ALKPHOS 76  --  72  --  53  --   --  50  PROT 9.2*  --  8.8*  --  6.5  --   --  7.3  ALBUMIN 3.7   < > 3.3*   < > 2.3* 2.3* 2.6* 2.9*   < > = values in this interval not displayed.    TUMOR MARKERS: No results for input(s): AFPTM, CEA, CA199, CHROMGRNA in the last 8760 hours.  Assessment and Plan: Patient with past medical history of CVA, intellectual disability presents with complaint of poor PO intake, dysphagia.  IR consulted for percutaneous gastrostomy tube placement at the request of Whitney Clendinin, NP. Case reviewed by Dr. Pascal Lux who approves patient for procedure.  Patient presents  today in their usual state of health.  He has been NPO and is not currently on blood thinners.  Niece updated on the fact that patient did not get barium overnight.   Secretary assisted in getting paperwork faxed from facility for med list review.  Patient in on aspirin 325 mg.   Risks and benefits image guided gastrostomy tube placement was discussed with the patient including, but not limited to the need for a barium enema during the procedure, bleeding, infection, peritonitis and/or damage to adjacent structures.  All of the patient's questions were answered, patient is agreeable to proceed.  Consent signed and in chart.  Thank you for this interesting consult.  I greatly enjoyed meeting Samuel Shelton and look forward to participating in their care.  A copy of this report was sent to the requesting provider on this date.  Electronically Signed: Docia Barrier, PA 08/27/2020, 10:56 AM   I spent a total of  30 Minutes   in face to face in clinical consultation, greater than 50% of which was counseling/coordinating care for dysphagia, poor PO intake.

## 2020-08-27 NOTE — Discharge Instructions (Addendum)
Gastrostomy Tube Home Guide, Adult A gastrostomy tube, or G-tube, is a tube that is inserted through the abdomen into the stomach. The tube is used to give feedings and medicines when a person is unable to eat and drink enough on his or her own. How to care for a G-tube Supplies needed  Saline solution or clean, warm water and soap.  Cotton swab or gauze.  Precut gauze bandage (dressing) and tape, if needed. Instructions 1. Wash your hands with soap and water. 2. If there is a dressing between the person's skin and the tube, remove it. 3. Check the area where the tube enters the skin. Check for problems such as: ? Redness. ? Swelling. ? Pus-like drainage. ? Extra skin growth. 4. Moisten the cotton swab with the saline solution or soap and water mixture. Gently clean around the insertion site. Remove any drainage or crusting. ? When the G-tube is first put in, a normal saline solution or water can be used to clean the skin. ? Mild soap and warm water can be used when the skin around the G-tube site has healed. 5. If there should be a dressing between the person's skin and the tube, apply it at this time. How to flush a G-tube Flush the G-tube regularly to keep it from clogging. Flush it before and after feedings and as often as told by the health care provider. Supplies needed  Purified or sterile water, warmed. If the person has a weak disease-fighting (immune) system, or if he or she has difficulty fighting off infections (is immunocompromised), use only sterile water. ? If you are unsure about the amount of chemical contaminants in purified or drinking water, use sterile water. ? To purify drinking water by boiling:  Boil water for at least 1 minute. Keep lid over water while it boils. Allow water to cool to room temperature before using.  60cc G-tube syringe. Instructions 1. Wash your hands with soap and water. 2. Draw up 30 mL of warm water in a syringe. 3. Connect the syringe  to the tube. 4. Slowly and gently push the water into the tube. G-tube problems and solutions  If the tube comes out: ? Cover the opening with a clean dressing and tape. ? Call a health care provider right away. ? A health care provider will need to put the tube back in within 4 hours.  If there is skin or scar tissue growing where the tube enters the skin: ? Keep the area clean and dry. ? Secure the tube with tape so that the tube does not move around too much. ? Call a health care provider.  If the tube gets clogged: ? Slowly push warm water into the tube with a large syringe. ? Do not force the fluid into the tube or push an object into the tube. ? If you are not able to unclog the tube, call a health care provider right away. Follow these instructions at home: Feedings  Give feedings at room temperature.  Cover and place unused feedings in the refrigerator.  If feedings are continuous: ? Do not put more than 4 hours worth of feedings in the feeding bag. ? Stop the feedings when you need to give medicine or flush the tube. Be sure to restart the feedings. ? Make sure the person's head is above his or her stomach (upright position). This will prevent choking and discomfort.  Replace feeding bags and syringes as told by the health care provider.  Make  sure the person is in the right position during and after feedings: ? During feedings, the person's position should be in the upright position. ? After a noncontinuous feeding (bolus feeding), have the person stay in the upright position for 1 hour. General instructions  Only use syringes made for G-tubes.  Do not pull or put tension on the tube.  Clamp the tube before removing the cap or disconnecting a syringe.  Measure the length of the G-tube every day from the insertion site to the end of the tube.  If the person's G-tube has a balloon, check the fluid in the balloon every week. The amount of fluid that should be in  the balloon can be found in the manufacturer's specifications.  Make sure the person takes care of his or her oral health, such as by brushing his or her teeth.  Remove excess air from the G-tube as told by the person's health care provider. This is called "venting."  Keep the area where the tube enters the skin clean and dry.  Do not push feedings, medicines, or flushes rapidly. Contact a health care provider if:  The person with the tube has any of these problems: ? Constipation. ? Fever.  There is a large amount of fluid or mucus-like liquid leaking from the tube.  Skin or scar tissue appears to be growing where the tube enters the skin.  The length of tube from the insertion site to the G-tube gets longer. Get help right away if:  The person with the tube has any of these problems: ? Severe abdominal pain. ? Severe tenderness. ? Severe bloating. ? Nausea. ? Vomiting. ? Trouble breathing. ? Shortness of breath.  Any of these problems happen in the area where the tube enters the skin: ? Redness, irritation, swelling, or soreness. ? Pus-like discharge. ? A bad smell.  The tube is clogged and cannot be flushed.  The tube comes out. Summary  A gastrostomy tube, or G-tube, is a tube that is inserted through the abdomen into the stomach. The tube is used to give feedings and medicines when a person is unable to eat and drink enough on his or her own.  Check and clean the insertion site daily as told by the person's health care provider.  Flush the G-tube regularly to keep it from clogging. Flush it before and after feedings and as often as told by the person's health care provider.  Keep the area where the tube enters the skin clean and dry. This information is not intended to replace advice given to you by your health care provider. Make sure you discuss any questions you have with your health care provider. Document Revised: 11/24/2017 Document Reviewed:  02/06/2017 Elsevier Patient Education  [place]. Moderate Conscious Sedation, Adult Sedation is the use of medicines to promote relaxation and relieve discomfort and anxiety. Moderate conscious sedation is a type of sedation. Under moderate conscious sedation, you are less alert than normal, but you are still able to respond to instructions, touch, or both. Moderate conscious sedation is used during short medical and dental procedures. It is milder than deep sedation, which is a type of sedation under which you cannot be easily woken up. It is also milder than general anesthesia, which is the use of medicines to make you unconscious. Moderate conscious sedation allows you to return to your regular activities sooner. Tell a health care provider about:  Any allergies you have.  All medicines you are taking, including  vitamins, herbs, eye drops, creams, and over-the-counter medicines.  Use of steroids (by mouth or creams).  Any problems you or family members have had with sedatives and anesthetic medicines.  Any blood disorders you have.  Any surgeries you have had.  Any medical conditions you have, such as sleep apnea.  Whether you are pregnant or may be pregnant.  Any use of cigarettes, alcohol, marijuana, or street drugs. What are the risks? Generally, this is a safe procedure. However, problems may occur, including:  Getting too much medicine (oversedation).  Nausea.  Allergic reaction to medicines.  Trouble breathing. If this happens, a breathing tube may be used to help with breathing. It will be removed when you are awake and breathing on your own.  Heart trouble.  Lung trouble. What happens before the procedure? Staying hydrated Follow instructions from your health care provider about hydration, which may include:  Up to 2 hours before the procedure - you may continue to drink clear liquids, such as water, clear fruit juice, black coffee, and plain  tea. Eating and drinking restrictions Follow instructions from your health care provider about eating and drinking, which may include:  8 hours before the procedure - stop eating heavy meals or foods such as meat, fried foods, or fatty foods.  6 hours before the procedure - stop eating light meals or foods, such as toast or cereal.  6 hours before the procedure - stop drinking milk or drinks that contain milk.  2 hours before the procedure - stop drinking clear liquids. Medicine Ask your health care provider about:  Changing or stopping your regular medicines. This is especially important if you are taking diabetes medicines or blood thinners.  Taking medicines such as aspirin and ibuprofen. These medicines can thin your blood. Do not take these medicines before your procedure if your health care provider instructs you not to.  Tests and exams  You will have a physical exam.  You may have blood tests done to show: ? How well your kidneys and liver are working. ? How well your blood can clot. General instructions  Plan to have someone take you home from the hospital or clinic.  If you will be going home right after the procedure, plan to have someone with you for 24 hours. What happens during the procedure?  An IV tube will be inserted into one of your veins.  Medicine to help you relax (sedative) will be given through the IV tube.  The medical or dental procedure will be performed. What happens after the procedure?  Your blood pressure, heart rate, breathing rate, and blood oxygen level will be monitored often until the medicines you were given have worn off.  Do not drive for 24 hours. This information is not intended to replace advice given to you by your health care provider. Make sure you discuss any questions you have with your health care provider. Document Revised: 11/24/2017 Document Reviewed: 04/02/2016 Elsevier Patient Education  2020 [employer].

## 2020-09-08 ENCOUNTER — Other Ambulatory Visit: Payer: Self-pay

## 2020-09-08 ENCOUNTER — Ambulatory Visit (INDEPENDENT_AMBULATORY_CARE_PROVIDER_SITE_OTHER): Payer: Medicare Other | Admitting: Adult Health

## 2020-09-08 ENCOUNTER — Encounter: Payer: Self-pay | Admitting: Adult Health

## 2020-09-08 VITALS — BP 132/85 | HR 108

## 2020-09-08 DIAGNOSIS — G9341 Metabolic encephalopathy: Secondary | ICD-10-CM | POA: Diagnosis not present

## 2020-09-08 DIAGNOSIS — I10 Essential (primary) hypertension: Secondary | ICD-10-CM

## 2020-09-08 DIAGNOSIS — Z8673 Personal history of transient ischemic attack (TIA), and cerebral infarction without residual deficits: Secondary | ICD-10-CM

## 2020-09-08 DIAGNOSIS — R5381 Other malaise: Secondary | ICD-10-CM | POA: Diagnosis not present

## 2020-09-08 NOTE — Progress Notes (Signed)
[facility] [address] [place]. [place] [address] [phone]       HOSPITAL FOLLOW UP NOTE  [name] Date of Birth:  1951-09-19 Medical Record Number:  [id number]   Reason for Referral:  hospital stroke follow up    SUBJECTIVE:   CHIEF COMPLAINT:  Chief Complaint  Patient presents with  . Follow-up    TX RM  . 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  CT head no acute abnormality.  Unable to obtain MRI due to patient not tolerating.  CTA head/neck negative LVO but did show moderate to severe stenosis of multiple vessels including cavernous right ICA, left MCA, right MCA, left ACA, right vertebral artery and left PCA.  Felt presentation more consistent with encephalopathy or postictal state possibly triggered by UTI and dehydration.  HTN stable.  LDL 34 -no statin recommended.  Controlled DM with A1c 5.3. PTA patient not on antiplatelet despite recent stroke and recommended initiating aspirin 325 mg daily for secondary stroke prevention.  Mental gradually improved to baseline and likely in setting of prior stroke, UTI and hypernatremia.  Evaluated by speech therapy and recommended continuation of dysphagia 2 diet and use of PEG tube for hydration to prevent recurrence of hypernatremia.  He was discharged back to SNF in stable condition.  Today, 09/08/2020, Samuel Shelton is being seen for hospital follow-up.  He continues to reside at [facility] and accompanied by his niece who provides history  Per niece, he has not had any reoccurrence of altered mental status compared to baseline or acute infections.  Per review of epic, PEG tube became dislodged and attempted replacement on 08/27/2020 but unfortunately by the time he was seen by IR, the tract had completely closed and tube unable to be replaced.  He was not n.p.o. the day of procedure therefore sedation not possible and was combative therefore decision made not to proceed with placement of G-tube.  Niece does report adequate appetite but is concerned regarding lack of G-tube for hydration and reoccurrence of hypernatremia.  She is unsure if recent lab work obtained.  She also reports frustration regarding lack of physical therapy and exercise and spends majority of the day in his wheelchair or bed.  Per review of recent hospitalization, reported w/c bound but per niece, he was ambulatory after his stroke but due to reoccurring hospitalizations for dehydration he continues to decondition.  She also reports he has been complaining of right knee pain.  She does report continued improvement of his speech but still has difficulty fully understanding.  Mental status currently at baseline.  Remains on aspirin 325 mg daily for secondary stroke prevention without bleeding or bruising Blood pressure today 132/85  No further concerns at this time       ROS:   Unable to obtain  PMH:  Past Medical History:  Diagnosis Date  . Chronic mental illness   . Edema of both legs   . Elevated PSA   . Elevated PSA   . Hypertension   . Mental retardation   . Phimosis     PSH:  Past Surgical History:  Procedure Laterality Date  .  IR REPLC GASTRO/COLONIC TUBE PERCUT W/FLUORO  08/20/2020  . PEG PLACEMENT N/A 01/24/2020   Procedure: PERCUTANEOUS ENDOSCOPIC GASTROSTOMY (PEG) PLACEMENT;  Surgeon: [name], MD;  Location: [facility] ENDOSCOPY;  Service: Endoscopy;  Laterality: N/A;  . PROSTATE BIOPSY N/A 06/22/2015   Procedure: PROSTATE BIOPSY;  Surgeon: Hollice Espy, MD;  Location: [facility] ORS;  Service: Urology;  Laterality: N/A;  . PROSTATE BIOPSY N/A 09/27/2016    Procedure: PROSTATE BIOPSY;  Surgeon: Hollice Espy, MD;  Location: [facility] ORS;  Service: Urology;  Laterality: N/A;    Social History:  Social History   Socioeconomic History  . Marital status: Single    Spouse name: Not on file  . Number of children: Not on file  . Years of education: Not on file  . Highest education level: Not on file  Occupational History  . Not on file  Tobacco Use  . Smoking status: Former Smoker    Packs/day: 0.50    Types: Cigarettes  . Smokeless tobacco: Never Used  Vaping Use  . Vaping Use: Never used  Substance and Sexual Activity  . Alcohol use: No    Alcohol/week: 0.0 standard drinks  . Drug use: No  . Sexual activity: Not on file  Other Topics Concern  . Not on file  Social History Narrative  . Not on file   Social Determinants of Health   Financial Resource Strain:   . Difficulty of Paying Living Expenses: Not on file  Food Insecurity:   . Worried About Charity fundraiser in the Last Year: Not on file  . Ran Out of Food in the Last Year: Not on file  Transportation Needs:   . Lack of Transportation (Medical): Not on file  . Lack of Transportation (Non-Medical): Not on file  Physical Activity:   . Days of Exercise per Week: Not on file  . Minutes of Exercise per Session: Not on file  Stress:   . Feeling of Stress : Not on file  Social Connections:   . Frequency of Communication with Friends and Family: Not on file  . Frequency of Social Gatherings with Friends and Family: Not on file  . Attends Religious Services: Not on file  . Active Member of Clubs or Organizations: Not on file  . Attends Archivist Meetings: Not on file  . Marital Status: Not on file  Intimate Partner Violence:   . Fear of Current or Ex-Partner: Not on file  . Emotionally Abused: Not on file  . Physically Abused: Not on file  . Sexually Abused: Not on file    Family History:  Family History  Problem Relation Age of Onset  . Stroke Mother   .  Hypertension Sister   . Diabetes type II Sister     Medications:   Current Outpatient Medications on File Prior to Visit  Medication Sig Dispense Refill  . acetaminophen (TYLENOL) 500 MG tablet Take 500 mg by mouth every 4 (four) hours as needed for mild pain.    Marland Kitchen aspirin 325 MG tablet Take 1 tablet (325 mg total) by mouth daily.    Marland Kitchen doxazosin (CARDURA) 1 MG tablet Take 1 mg by mouth daily.    . ferrous sulfate 325 (65 FE) MG tablet Take 325 mg by mouth daily with breakfast.    . finasteride (PROSCAR) 5 MG tablet Take 1 tablet (5 mg total) by mouth daily. 30 tablet 11  . pantoprazole (PROTONIX) 40 MG tablet Take 40 mg by mouth daily.    Marland Kitchen  Water For Irrigation, Sterile (FREE WATER) SOLN Place 120 mLs into feeding tube every 4 (four) hours. 100 mL 0   No current facility-administered medications on file prior to visit.    Allergies:  No Known Allergies    OBJECTIVE:  Physical Exam  Vitals:   09/08/20 1007  BP: 132/85  Pulse: (!) 108   There is no height or weight on file to calculate BMI. No exam data present  General: well developed, well nourished,  pleasant elderly African-American male, seated in w/c, in no evident distress   Neck: supple with no carotid or supraclavicular bruits Cardiovascular: regular rate and rhythm, no murmurs Musculoskeletal: R knee contracture with swelling and guarding Vascular:  Normal pulses all extremities   Neurologic Exam Mental Status: Awake and fully alert.   Moderate to severe dysarthria difficulty understanding majority of speech output.  Nods yes/no mostly appropriately.  Able to state his name but unable to appropriately answer other orientation questions.  Unable to fully assess cognition due to speech difficulty.  Mood and affect appropriate but uncooperative with majority of testing.  Cranial Nerves: Fundoscopic exam unable to complete.  Pupils equal, briskly reactive to light. Blink to threat bilaterally. Hearing intact. Face  appears symmetric Motor: Difficulty fully testing but appeared equal in upper extremities.  Unable to test BLE due to uncooperation and guarding of RLE 2/2 to suspected knee pain Sensory.:  Responded appropriately to painful stimuli Gait and Station: Deferred as patient nonambulatory      ASSESSMENT: Banner Huckaba is a 69 y.o. year old male presented with right facial droop, drooling, leaning towards the right, altered mental status and nonverbal on 08/03/2020.  CT headx2 negative for acute abnormality.  MRI unable to be obtained.  Presentation more consistent with encephalopathy with worsening of prior stroke symptoms likely triggered by UTI and dehydration with hypernatremia.  Vascular risk factors include L BG/CR s/p tpa 01/2019 without residual deficit, intellectual disability, HTN, diffuse intracranial stenosis and prostate cancer.      PLAN:  1. Encephalopathy: a. Currently at baseline b. No reoccurring episodes or symptoms c. Discussed importance of adequate hydration and fluid intake -no longer has PEG tube (see HPI).  This may need to be reevaluated based on fluid intake and sodium levels 2. 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 Continue aspirin 325 mg daily for secondary stroke prevention b. Per Dr. Erlinda Hong, no indication for statin due to low LDL at 34 c. Ensure close PCP/facility follow-up for aggressive stroke risk factor management 3. HTN: BP goal <130/90. Continue f/u with PCP 4. Right knee pain: Deferred further evaluation to facility -guarding with evidence of pain and swelling 5. Deconditioning: Currently nonambulatory - per niece, he was previously ambulating and she is frustrated due to lack of therapy.  Advised to further discuss this with facility   Overall stable from neurological standpoint with limited intervention or further recommendations as he is currently monitored closely at facility and difficulty with visits.  Please do not hesitate to call office with any questions  or concerns   I spent 45 minutes of face-to-face and non-face-to-face time with patient and niece.  This included previsit chart review, lab review, study review, order entry, electronic health record documentation, family education regarding recent hospitalization, prior stroke, importance of managing stroke risk factors and answered all questions to family's satisfaction   Frann Rider, [place]  [facility] [address] [place], [place] [address]  Phone [phone] Fax [phone] Note: This document was  prepared with digital dictation and possible smart phrase technology. Any transcriptional errors that result from this process are unintentional.

## 2020-09-08 NOTE — Patient Instructions (Addendum)
Your Plan:  Continue aspirin 325 mg daily for secondary stroke prevention  Continue close PCP/facility follow-up for aggressive stroke risk factor management  Please ensure routine participation with therapy for strengthening and avoidance of deconditioning.  Right leg contracture with increased swelling and pain - please further evaluate  No further recommendations or interventions from a stroke standpoint    Follow-up on an as-needed basis       Thank you for coming to see [place] at [facility]. I hope we have been able to provide you high quality care today.  You may receive a patient satisfaction survey over the next few weeks. We would appreciate your feedback and comments so that we may continue to improve ourselves and the health of our patients.

## 2020-09-09 NOTE — Progress Notes (Signed)
I agree with the above plan 

## 2020-09-22 NOTE — Progress Notes (Signed)
Patient was brought to appiontment via driver. His health care proxy did not show up to visit today. Clinic tried to reach her by phone numerous times but was unsuccessful. The driver noted that she knew about the appointment today. A letter will be sent to his health care proxy informing her that she must be physically present for appointment and/or available by phone.

## 2020-09-23 ENCOUNTER — Other Ambulatory Visit: Payer: Self-pay

## 2020-09-23 ENCOUNTER — Ambulatory Visit (INDEPENDENT_AMBULATORY_CARE_PROVIDER_SITE_OTHER): Payer: Medicare Other | Admitting: Urology

## 2020-09-23 VITALS — BP 125/88 | HR 94

## 2020-09-23 DIAGNOSIS — C61 Malignant neoplasm of prostate: Secondary | ICD-10-CM

## 2020-10-19 NOTE — Progress Notes (Signed)
10/20/2020 10:50 AM   Samuel Shelton 12/18/1951 [id number]  Referring provider: Bonnita Nasuti, MD [address] [place],  [place] [address] No chief complaint on file.   HPI: Samuel Shelton is a 69 y.o. male who returns for a 1 year follow up of prostate cancer and incomplete bladder emptying. Patient is accompanied by his health care proxy.   Prostate cancer history: He initially presented for evaluation of an elevated PSA to 5.36 ng/dL in Fall 2015 . He also had a 4K study which indicated 43% risk of high grade malignancy if DRE is positive or 26% if DRE is negative. nfortunately, Mr. Radel is mentally handicapped and unable to tolerate a DRE/ biopsy in the office. In the interim, his PSA rose to rose to 7.4 ng/dL on 05/20/15.  He was taken to the OR on 05/2015 at which time DRE in the OR showed no evidence of nodules, enlarged gland. Pathology consistent wit Gleason 3+3 involving 3/13 cores up to 30% on the right.   He returned to the operating room on 09/2016 for repeat confirmatory biopsy. This showed a total of 3 of 12 cores on the left with Gleason 3+3 involving up to 50% of the tissue. Exam under anesthesia was unremarkable. Prostate volume measured 35 cc.  His PSA has been relatively stable in the 7- 8 range but recently jumped up to 11.2.His was stable x 3 months at 11.1 on 01/18/18.PSA dropped to 5.4 on 03/01/2019 with appropriate use of finasteride.  BPH/incomplete bladder emptying/UTIs: Patient was admitted earlier this year in 12/2019 for acute kidney failure, hypernatremia and altered mental status. Patient had a foley catheter placed for acute retention. Foley was removed on 01/28/2020 by urology after irrigation. Bladder scan was >250cc. Patient later reported he could not urinate and a foley was placed.   During admission the patient was given IV rocephin for acute cystitis and was switched to liquid keflex.   RUS on 01/08/2020 was unremarkable.  RUS on 01/24/2020 noted showed hydronephrosis with bladder debris/hemorrhage. Abdominal CT showed bilateral hydronephrosis and hydroureter appears secondary to marked bladder distension.No obstructing calculus identified although there may be punctate calculi layering within the bladder.Bladder wall thickening and perivesical stranding raises the possibility of superimposed bladder infection.  Patient was admitted to [facility] from 08/03/2020 to 08/07/2020 for encephalopathy and right sided weakness. During admission the patient was treated for an acute UTI. Patient was given IV Rocephin. Urine culture grew multiple species. He was discharged on Omnicef x 3 days.   The patient remains on Flomax and finasteride. He is voiding well. His health care proxy reports loss of muscular function since stroke. She reports he is doing much better. No dysuria or hematuria. Bladder scan by PVR is 704 mL. Patient reports he has to use the restroom. Patient is wearing a diaper.    PMH: Past Medical History:  Diagnosis Date  . Chronic mental illness   . Edema of both legs   . Elevated PSA   . Elevated PSA   . Hypertension   . Mental retardation   . Phimosis     Surgical History: Past Surgical History:  Procedure Laterality Date  . IR REPLC GASTRO/COLONIC TUBE PERCUT W/FLUORO  08/20/2020  . PEG PLACEMENT N/A 01/24/2020   Procedure: PERCUTANEOUS ENDOSCOPIC GASTROSTOMY (PEG) PLACEMENT;  Surgeon: [name], MD;  Location: [facility] ENDOSCOPY;  Service: Endoscopy;  Laterality: N/A;  . PROSTATE BIOPSY N/A 06/22/2015   Procedure: PROSTATE BIOPSY;  Surgeon: Hollice Espy, MD;  Location:  [facility] ORS;  Service: Urology;  Laterality: N/A;  . PROSTATE BIOPSY N/A 09/27/2016   Procedure: PROSTATE BIOPSY;  Surgeon: Hollice Espy, MD;  Location: [facility] ORS;  Service: Urology;  Laterality: N/A;    Home Medications:  Allergies as of 10/20/2020   No Known Allergies     Medication List       Accurate as of October 20, 2020  11:59 PM. If you have any questions, ask your nurse or doctor.        acetaminophen 500 MG tablet Commonly known as: TYLENOL Take 500 mg by mouth every 4 (four) hours as needed for mild pain.   aspirin 325 MG tablet Take 1 tablet (325 mg total) by mouth daily.   doxazosin 1 MG tablet Commonly known as: CARDURA Take 1 mg by mouth daily.   ferrous sulfate 325 (65 FE) MG tablet Take 325 mg by mouth daily with breakfast.   finasteride 5 MG tablet Commonly known as: PROSCAR Take 1 tablet (5 mg total) by mouth daily.   free water Soln Place 120 mLs into feeding tube every 4 (four) hours.   pantoprazole 40 MG tablet Commonly known as: PROTONIX Take 40 mg by mouth daily.       Allergies: No Known Allergies  Family History: Family History  Problem Relation Age of Onset  . Stroke Mother   . Hypertension Sister   . Diabetes type II Sister     Social History:  reports that he has quit smoking. His smoking use included cigarettes. He smoked 0.50 packs per day. He has never used smokeless tobacco. He reports that he does not drink alcohol and does not use drugs.   Physical Exam: There were no vitals taken for this visit.  Constitutional:  Alert and oriented, No acute distress. In wheelchair. Non ambulatory.  HEENT: [place] AT, moist mucus membranes.  Trachea midline, no masses. Cardiovascular: No clubbing, cyanosis, or edema. Respiratory: Normal respiratory effort, no increased work of breathing. Skin: No rashes, bruises or suspicious lesions. Neurologic: Grossly intact, no focal deficits, moving all 4 extremities. Aphasic  Psychiatric: Normal mood and affect. Smiling.   Laboratory Data:  Lab Results  Component Value Date   CREATININE 1.03 08/06/2020    Lab Results  Component Value Date   HGBA1C 5.3 08/04/2020     Assessment & Plan:    1. Prostate cancer PSA dropped to 5.4 on 03/01/2019 with appropriate use of finasteride. PSA is pending, will call with results.    Unable to tolerate DRE.  Continue Flomax and finasteride.   2. Incomplete bladder emptying  Chronic and worsening, otherwise asymptomatic.  Suspect this is related to recent stroke.  PVR elevated at 704 mL. Patient could not urinate on command and I am unsure of the last time patient urinated-he is minimally verbal Advised health care proxy to contact clinic if patient does not void in 4 hours.    We discussed bladder management as this may related to recurrent infections. Discussed consideration of SP tube.  Discussed catheterization but patient may not tolerate this as he has a history of irritation.    Plan to have him follow-up next week for a bladder scan to ensure that he is emptying adequately and follow him closely.  She was provided information today about SP tube and I like her to strongly consider this given his history.  If his PVR is significantly elevated at follow-up, will check creatinine as well as renal ultrasound to ensure that he is not  having upper tract damage from chronic retention.   [facility] [address], [place], [place] [address] [phone]  I, Selena Batten, am acting as a scribe for Dr. Hollice Espy.  I have reviewed the above documentation for accuracy and completeness, and I agree with the above.   Hollice Espy, MD

## 2020-10-20 ENCOUNTER — Other Ambulatory Visit: Payer: Self-pay

## 2020-10-20 ENCOUNTER — Ambulatory Visit (INDEPENDENT_AMBULATORY_CARE_PROVIDER_SITE_OTHER): Payer: Medicare Other | Admitting: Urology

## 2020-10-20 ENCOUNTER — Ambulatory Visit: Payer: Self-pay | Admitting: Urology

## 2020-10-20 DIAGNOSIS — R339 Retention of urine, unspecified: Secondary | ICD-10-CM | POA: Diagnosis not present

## 2020-10-20 DIAGNOSIS — C61 Malignant neoplasm of prostate: Secondary | ICD-10-CM

## 2020-10-20 LAB — BLADDER SCAN AMB NON-IMAGING: Scan Result: 704

## 2020-10-21 ENCOUNTER — Telehealth: Payer: Self-pay

## 2020-10-21 LAB — PSA: Prostate Specific Ag, Serum: 5.2 ng/mL — ABNORMAL HIGH (ref 0.0–4.0)

## 2020-10-21 NOTE — Telephone Encounter (Signed)
-----   Message from Hollice Espy, MD sent at 10/21/2020  9:28 AM EDT ----- PSA is stable, please let health care proxy know  Hollice Espy, MD

## 2020-10-26 ENCOUNTER — Ambulatory Visit: Payer: Self-pay | Admitting: Neurology

## 2020-11-02 NOTE — Progress Notes (Signed)
10/20/2020 6:42 PM   Samuel Shelton 02-01-1951 [id number]  Referring provider: Bonnita Nasuti, MD [address] [place],  [place] [address] Chief Complaint  Patient presents with  . Prostate Cancer    HPI: Samuel Shelton is a 68 y.o. male who returns for a 1 year follow up of prostate cancer and incomplete bladder emptying. Patient is accompanied by his health care proxy, his niece Samuel Shelton.    Prostate cancer history: He initially presented for evaluation of an elevated PSA to 5.36 ng/dL in Fall 2015 . He also had a 4K study which indicated 43% risk of high grade malignancy if DRE is positive or 26% if DRE is negative. nfortunately, 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 In the interim, his PSA rose to rose to 7.4 ng/dL on 05/20/15.  He was taken to the OR on 05/2015 at which time DRE in the OR showed no evidence of nodules, enlarged gland. Pathology consistent wit Gleason 3+3 involving 3/13 cores up to 30% on the right.   He returned to the operating room on 09/2016 for repeat confirmatory biopsy. This showed a total of 3 of 12 cores on the left with Gleason 3+3 involving up to 50% of the tissue. Exam under anesthesia was unremarkable. Prostate volume measured 35 cc.  His PSA has been relatively stable in the 7- 8 range but recently jumped up to 11.2.His was stable x 3 months at 11.1 on 01/18/18.PSA dropped to 5.4 on 03/01/2019 with appropriate use of finasteride.  Recent PSA 5.2 09/2020.    BPH/incomplete bladder emptying/UTIs: Patient was admitted earlier this year in 12/2019 for acute kidney failure, hypernatremia and altered mental status. Patient had a foley catheter placed for acute retention. Foley was removed on 01/28/2020 by urology after irrigation. Bladder scan was >250cc. Patient later reported he could not urinate and a foley was placed.   During admission the patient was given IV rocephin for acute  cystitis and was switched to liquid keflex.   RUS on 01/08/2020 was unremarkable. RUS on 01/24/2020 noted showed hydronephrosis with bladder debris/hemorrhage. Abdominal CT showed bilateral hydronephrosis and hydroureter appears secondary to marked bladder distension.No obstructing calculus identified although there may be punctate calculi layering within the bladder.Bladder wall thickening and perivesical stranding raises the possibility of superimposed bladder infection.  Patient was admitted to [facility] from 08/03/2020 to 08/07/2020 for encephalopathy and right sided weakness. During admission the patient was treated for an acute UTI. Patient was given IV Rocephin. Urine culture grew multiple species. He was discharged on Omnicef x 3 days.   The patient remains on Flomax and finasteride. His health care proxy reports loss of muscular function since stroke.  His health care proxy is not sure if he is urinating at the facility.  Bladder scan by PVR is > 645 mL.   Patient is wearing a diaper.   She is wanting to pursue a SPT at this time.   He is currently residing at [facility] residents.     PMH: Past Medical History:  Diagnosis Date  . Chronic mental illness   . Edema of both legs   . Elevated PSA   . Elevated PSA   . Hypertension   . Mental retardation   . Phimosis     Surgical History: Past Surgical History:  Procedure Laterality Date  . IR REPLC GASTRO/COLONIC TUBE PERCUT W/FLUORO  08/20/2020  . PEG PLACEMENT N/A 01/24/2020   Procedure: PERCUTANEOUS ENDOSCOPIC GASTROSTOMY (  PEG) PLACEMENT;  Surgeon: [name], MD;  Location: [facility] ENDOSCOPY;  Service: Endoscopy;  Laterality: N/A;  . PROSTATE BIOPSY N/A 06/22/2015   Procedure: PROSTATE BIOPSY;  Surgeon: Hollice Espy, MD;  Location: [facility] ORS;  Service: Urology;  Laterality: N/A;  . PROSTATE BIOPSY N/A 09/27/2016   Procedure: PROSTATE BIOPSY;  Surgeon: Hollice Espy, MD;  Location: [facility] ORS;  Service: Urology;  Laterality: N/A;    Home  Medications:  Allergies as of 11/03/2020   No Known Allergies     Medication List       Accurate as of November 03, 2020 11:59 PM. If you have any questions, ask your nurse or doctor.        acetaminophen 500 MG tablet Commonly known as: TYLENOL Take 500 mg by mouth every 4 (four) hours as needed for mild pain.   aspirin 325 MG tablet Take 1 tablet (325 mg total) by mouth daily.   doxazosin 1 MG tablet Commonly known as: CARDURA Take 1 mg by mouth daily.   ferrous sulfate 325 (65 FE) MG tablet Take 325 mg by mouth daily with breakfast.   finasteride 5 MG tablet Commonly known as: PROSCAR Take 1 tablet (5 mg total) by mouth daily.   free water Soln Place 120 mLs into feeding tube every 4 (four) hours.   pantoprazole 40 MG tablet Commonly known as: PROTONIX Take 40 mg by mouth daily.       Allergies: No Known Allergies  Family History: Family History  Problem Relation Age of Onset  . Stroke Mother   . Hypertension Sister   . Diabetes type II Sister     Social History:  reports that he has quit smoking. His smoking use included cigarettes. He smoked 0.50 packs per day. He has never used smokeless tobacco. He reports that he does not drink alcohol and does not use drugs.   Physical Exam: BP (!) 145/105   Pulse 98   Ht 6' (1.829 m)   Wt 173 lb (78.5 kg)   BMI 23.46 kg/m   Constitutional:  Well nourished. Alert and oriented, No acute distress. HEENT: [place] AT, mask in place.  Trachea midline Cardiovascular: No clubbing, cyanosis, or edema. Respiratory: Normal respiratory effort, no increased work of breathing. Neurologic: Grossly intact, no focal deficits, moving all 4 extremities.  In wheel chair.  Psychiatric: Normal mood and affect.  Laboratory Data: Lab Results  Component Value Date   CREATININE 0.88 11/03/2020    Lab Results  Component Value Date   HGBA1C 5.3 08/04/2020  I have reviewed the labs.   Assessment & Plan:    1. Prostate  cancer PSA dropped to 5.2 on 09/2020 with appropriate use of finasteride. Unable to tolerate DRE.  Continue Flomax and finasteride.   2. Incomplete bladder emptying  Chronic and worsening, otherwise asymptomatic.  Suspect this is related to recent stroke.  PVR continues to be elevated at 645 mL. Discussed the risk of the procedure and having a catheter in place Patient's healthcare proxy would like to pursue SPT placement at this time   [place] [address], [place], [place] [address] [phone]  [facility], PA-C   I spent 15 minutes on the day of the encounter to include pre-visit record review, face-to-face time with the patient, and post-visit ordering of tests.

## 2020-11-03 ENCOUNTER — Other Ambulatory Visit: Payer: Self-pay

## 2020-11-03 ENCOUNTER — Ambulatory Visit (INDEPENDENT_AMBULATORY_CARE_PROVIDER_SITE_OTHER): Payer: Medicare Other | Admitting: Urology

## 2020-11-03 ENCOUNTER — Telehealth: Payer: Self-pay | Admitting: Urology

## 2020-11-03 ENCOUNTER — Encounter: Payer: Self-pay | Admitting: Urology

## 2020-11-03 VITALS — BP 145/105 | HR 98 | Ht 72.0 in | Wt 173.0 lb

## 2020-11-03 DIAGNOSIS — R339 Retention of urine, unspecified: Secondary | ICD-10-CM | POA: Diagnosis not present

## 2020-11-03 DIAGNOSIS — C61 Malignant neoplasm of prostate: Secondary | ICD-10-CM

## 2020-11-03 LAB — BLADDER SCAN AMB NON-IMAGING: Scan Result: >645

## 2020-11-03 NOTE — Telephone Encounter (Signed)
Please schedule Mr. Dalsanto for a SPT placement with IR.  He is on ASA 325 mg daily managed by Frann Rider, NP at [facility].

## 2020-11-03 NOTE — Patient Instructions (Signed)
Suprapubic Catheter Home Guide A suprapubic catheter is a flexible tube that is used to drain urine from the bladder into a collection bag outside the body. The catheter is inserted into the bladder through a small opening in the lower abdomen, above the pubic bone (suprapubic area) and a few inches below your belly button (navel). A tiny balloon filled with germ-free (sterile) water helps to keep the catheter in place. The collection bag must be emptied at least once a day and cleaned at least every other day. The collection bag can be put beside your bed at night and attached to your leg during the day. You may have a large collection bag to use at night and a smaller one to use during the day. Your suprapubic catheter may need to be changed every 4-6 weeks, or as often as recommended by your health care provider. Healing of the tract where the catheter is placed can take 6 weeks to 6 months. During that time, your health care provider may change your catheter. Once the tract is well healed, you or a caregiver will change your suprapubic catheter at home. What are the risks? This catheter is safe to use. However, problems can occur, including:  Blocked urine flow. This can occur if the catheter stops working, or if you have a blood clot in your bladder or in the catheter.  Irritation of the skin around the catheter.  Infection. This can happen if bacteria gets into your bladder. Supplies needed:  Two pairs of sterile gloves.  Paper towels.  Catheter.  Two syringes.  Sterile water.  Sterile cleaning solution.  Lubricant.  Collection bags. How to change the catheter  1. Drink plenty of fluids during the hours before you change the catheter. 2. Wash your hands with soap and water. If soap and water are not available, use hand sanitizer. 3. Draw up sterile water into a syringe to have ready to fill the new catheter balloon. The amount will depend on the size of the balloon. 4. Have  all of your supplies ready and close to you on a paper towel. 5. Lie on your back, sitting slightly upright so that you can see the catheter and opening. 6. Put on sterile gloves. 7. Clean the skin around the catheter opening using the sterile cleaning solution. 8. Remove the water from the balloon in the catheter using a syringe. 9. Slowly remove the catheter. If the catheter seems stuck, or if you have difficulty removing it: ? Do not pull on it. ? Call your health care provider right away. 10. Place the old catheter on a paper towel to discard later. 11. Take off the used gloves, and put on a new pair. 12. Put lubricant on the end of the new catheter that will go into your bladder. 13. Clean the skin around the catheter opening using the sterile cleaning solution. 14. Gently slide the catheter through the opening in your abdomen and into the tract that leads to your bladder. 15. Wait for some urine to start flowing through the catheter. 16. When urine starts to flow through the catheter, attach the collection bag to the end of the catheter. Make sure the connection is tight. 17. Use a syringe to fill the catheter balloon with sterile water. Fill to the amount directed by your health care provider. 18. Remove the gloves and wash your hands with soap and water. How to care for the skin around the catheter Follow your health care provider's instructions on  caring for your skin.  Use a clean washcloth and soapy water to clean the skin around your catheter every day. Pat the area dry with a clean paper towel.  Do not pull on the catheter.  Do not use ointment or lotion on this area, unless told by your health care provider.  Check the skin around the catheter every day for signs of infection. Check for: ? Redness, swelling, or pain. ? Fluid or blood. ? Warmth. ? Pus or a bad smell. How to empty and clean the collection bag Empty the large collection bag every 8 hours. Empty the small  collection bag when it is about ? full. Clean the collection bag every 2-3 days, or as often as told by your health care provider. To do this: 1. Wash your hands with soap and water. If soap and water are not available, use hand sanitizer. 2. Disconnect the bag from the catheter and immediately attach a new bag to the catheter. 3. Hold the used bag over the toilet or another container. 4. Turn the valve (spigot) at the bottom of the bag to empty the urine. Empty the used bag completely. ? Do not touch the opening of the spigot. ? Do not let the opening touch the toilet or container. 5. Close the spigot tightly when the bag is empty. 6. Clean the used bag in one of the following methods: ? According to the manufacturer's instructions. ? As told by your health care provider. 7. Let the bag dry completely. Put it in a clean plastic bag before storing it. General tips   Always wash your hands before and after caring for your catheter and collection bag. Use a mild, fragrance-free soap. If soap and water are not available, use hand sanitizer.  Clean the outside of the catheter with soap and water as often as told by your health care provider.  Always make sure there are no twists or kinks in the catheter tube.  Always make sure there are no leaks in the catheter or collection bag.  Always wear the leg bag below your knee.  Make sure the overnight drainage bag is always lower than the level of your bladder, but do not let it touch the floor. Before you go to sleep, hang the bag inside a wastebasket that is covered by a clean plastic bag.  Drink enough fluid to keep your urine pale yellow.  Do not take baths, swim, or use a hot tub until your health care provider approves. Ask your health care provider if you may take showers. Contact a heath care provider if:  You leak urine.  You have redness, swelling, or pain around your catheter.  You have fluid or blood coming from your catheter  opening.  Your catheter opening feels warm to the touch.  You have pus or a bad smell coming from your catheter opening.  You have a fever or chills.  Your urine flow slows down.  Your urine becomes cloudy or smelly. Get help right away if:  Your catheter comes out.  You have: ? Nausea. ? Back pain. ? Difficulty changing your catheter. ? Blood in your urine. ? No urine flow for 1 hour. Summary  A suprapubic catheter is a flexible tube that is used to drain urine from the bladder into a collection bag outside the body.  Your suprapubic catheter may need to be changed every 4-6 weeks, or as recommended by your health care provider.  Follow instructions on how to  change the catheter and how to empty and clean the collection bag.  Always wash your hands before and after caring for your catheter and collection bag. Drink enough fluid to keep your urine pale yellow.  Get help right away if you have difficulty changing your catheter or if there is blood in your urine. This information is not intended to replace advice given to you by your health care provider. Make sure you discuss any questions you have with your health care provider. Document Revised: 04/04/2019 Document Reviewed: 01/16/2019 Elsevier Patient Education  2020 [employer].  Suprapubic Catheter Replacement  Suprapubic catheter replacement is a procedure to remove an old catheter and insert a new, clean catheter. A suprapubic catheter is a rubber tube that drains urine from the bladder into a collection bag outside the body. The catheter is inserted into the bladder through a small opening in the lower abdomen, near the center of the body, above the pubic bone (suprapubicarea). There is a tiny balloon filled with germ-free (sterile) water on the end of the catheter that is in the bladder. The balloon helps to keep the catheter in place. If you need to wear a catheter for a long period of time, you may be instructed to  replace the catheter yourself. Usually, suprapubic catheters need to be replaced every 4-6 weeks, or as often as told by your health care provider. What are the risks? Generally, this is a safe procedure. However, problems may occur, including failure to get the catheter into the bladder. What happens before the procedure?  You may have an exam or testing, including a blood or urine sample.  Ask your health care provider what steps will be taken to help prevent infection. What happens during the procedure?  You will lie on your back.  The water from the balloon will be removed using a syringe.  The catheter will be slowly removed.  Lubricant will be applied to the end of the new catheter that will go into your bladder.  The new catheter will be inserted through the opening in your abdomen. Your health care provider will slide the catheter into your bladder.  Your health care provider will wait for some urine to start flowing through the catheter. When this happens, a syringe will be used to fill the balloon with sterile water.  A collection bag will be attached to the end of the catheter. The procedure may vary among health care providers and hospitals. What can I expect after procedure? After the procedure, it is common to have:  Some discomfort around the opening in your abdomen. Follow these instructions at home: Caring for the skin around the catheter Use a clean washcloth and soapy water to clean the skin around your catheter every day. Pat the area dry with a clean towel.  Do not pull on the catheter.  Do not use ointment or lotion on this area unless told by your health care provider.  Check the skin around the catheter every day for signs of infection. Check for: ? Redness, swelling, or pain. ? Fluid or blood. ? Warmth. ? Pus or a bad smell. Caring for the catheter  Clean the catheter with soap and water as often as told by your health care provider.  Always make  sure there are no twists or curls (kinks) in the catheter.  As soon as you are able to move, you may use a leg bag to collect the urine. ? Make sure that the tubing is  straight and without kinks. ? Wrap an ace bandage gently over the tubing and around your leg to minimize the risk of the bag getting pulled out. Emptying the collection bag Empty the large collection bag every 8 hours. Empty the small collection bag when it is about ? full. To empty your large or small collection bag, take the following steps:  Always keep the bag below the level of the catheter. This keeps urine from flowing backward into the catheter.  Hold the bag over the toilet or another container. Turn the valve (spigot) at the bottom of the bag to empty the urine. ? Do not touch the opening of the spigot. ? Do not let the opening touch the toilet or container.  Close the spigot tightly when the bag is empty. Cleaning the collection bag   Wash your hands with soap and water. If soap and water are not available, use hand sanitizer.  Disconnect the bag from the catheter and immediately attach a new bag to the catheter.  Empty the used bag completely.  Clean the used bag according to the manufacturer's instructions, or as told by your health care provider.  Let the bag dry completely, and put it in a clean plastic bag before storing it. General instructions   Always wash your hands before and after caring for your catheter and collection bag. Use soap and water. If soap and water are not available, use hand sanitizer.  Always make sure there are no leaks in the catheter or collection bag.  Drink enough fluid to keep your urine pale yellow.  If you were prescribed an antibiotic medicine, take it as told by your health care provider. Do not stop taking the antibiotic even if you start to feel better.  Do not take baths, swim, or use a hot tub.  Keep all follow-up visits as told by your health care provider.  This is important. Contact a health care provider if:  You leak urine.  You have redness, swelling, or pain around your catheter opening.  You have fluid or blood coming from your catheter opening.  Your catheter opening feels warm to the touch.  You have pus or a bad smell coming from your catheter opening.  You have a fever or chills.  Your urine flow slows down.  Your urine becomes cloudy or smelly. Get help right away if:  Your catheter comes out.  You feel nauseous.  You have back pain.  You have difficulty changing your catheter.  You have blood in your urine.  You have no urine flow for 1 hour. Summary  Suprapubic catheter replacement is a procedure to remove an old catheter and insert a new, clean catheter.  Make sure that you understand how to care for your catheter, your collection bag, and the opening in your abdomen.  Always wash your hands before and after caring for your catheter and collection bag.  Contact a health care provider if you leak urine or have a fever or any signs of infection around the catheter opening, or if your urine becomes cloudy or smelly.  Get help right away if your catheter comes out or you have nausea, back pain, blood in your urine, no urine flow for 1 hour, or difficulty changing your catheter. This information is not intended to replace advice given to you by your health care provider. Make sure you discuss any questions you have with your health care provider. Document Revised: 08/01/2018 Document Reviewed: 08/01/2018 Elsevier Patient Education  2020 [employer] Inc[employer]  

## 2020-11-04 ENCOUNTER — Telehealth: Payer: Self-pay | Admitting: Family Medicine

## 2020-11-04 LAB — CREATININE, SERUM
Creatinine, Ser: 0.88 mg/dL (ref 0.76–1.27)
GFR calc Af Amer: 100 mL/min/{1.73_m2} (ref >59)
GFR calc non Af Amer: 86 mL/min/{1.73_m2} (ref >59)

## 2020-11-04 NOTE — Telephone Encounter (Signed)
-----   Message from Nori Riis, PA-C sent at 11/04/2020  7:28 AM EST ----- Please let his niece, [name], know that his creatinine was normal.

## 2020-11-04 NOTE — Telephone Encounter (Signed)
[name] notified and voiced understanding.

## 2020-11-04 NOTE — Telephone Encounter (Signed)
I have sent a telephone message to Amy regarding placement of his SPT.

## 2020-11-04 NOTE — Telephone Encounter (Signed)
Patient's niece states she is wanting to have him get the SPT tube.

## 2020-11-05 ENCOUNTER — Other Ambulatory Visit: Payer: Self-pay | Admitting: Radiology

## 2020-11-05 DIAGNOSIS — R339 Retention of urine, unspecified: Secondary | ICD-10-CM

## 2020-11-06 ENCOUNTER — Other Ambulatory Visit: Payer: Self-pay | Admitting: Student

## 2020-11-09 ENCOUNTER — Other Ambulatory Visit: Payer: Self-pay

## 2020-11-09 ENCOUNTER — Ambulatory Visit
Admission: RE | Admit: 2020-11-09 | Discharge: 2020-11-09 | Disposition: A | Discharge status: Home / Self Care | Payer: Medicare Other | Source: Ambulatory Visit | Attending: Urology | Admitting: Urology

## 2020-11-09 DIAGNOSIS — R339 Retention of urine, unspecified: Secondary | ICD-10-CM | POA: Insufficient documentation

## 2020-11-09 MED ORDER — FENTANYL CITRATE (PF) 100 MCG/2ML IJ SOLN
INTRAMUSCULAR | Status: AC | PRN
  Administered 2020-11-09 (×2): 25 ug via INTRAVENOUS

## 2020-11-09 MED ORDER — MIDAZOLAM HCL 2 MG/2ML IJ SOLN
INTRAMUSCULAR | Status: AC | PRN
  Administered 2020-11-09: 1 mg via INTRAVENOUS
  Administered 2020-11-09 (×2): 0.5 mg via INTRAVENOUS

## 2020-11-09 MED ORDER — SODIUM CHLORIDE 0.9% FLUSH: 5.0000 mL | Freq: Three times a day (TID) | INTRAVENOUS | Status: DC

## 2020-11-09 MED ORDER — FENTANYL CITRATE (PF) 100 MCG/2ML IJ SOLN
INTRAMUSCULAR | Status: AC
  Filled 2020-11-09: qty 2

## 2020-11-09 MED ORDER — CIPROFLOXACIN IN D5W 400 MG/200ML IV SOLN
400.0000 mg | Freq: Once | INTRAVENOUS | Status: DC
  Filled 2020-11-09: qty 200

## 2020-11-09 MED ORDER — MIDAZOLAM HCL 2 MG/2ML IJ SOLN
INTRAMUSCULAR | Status: AC
  Filled 2020-11-09: qty 2

## 2020-11-09 MED ORDER — HYDROCODONE-ACETAMINOPHEN 5-325 MG PO TABS
1.0000 | ORAL_TABLET | ORAL | Status: DC | PRN
  Filled 2020-11-09: qty 2

## 2020-11-09 MED ORDER — CIPROFLOXACIN IN D5W 200 MG/100ML IV SOLN
INTRAVENOUS | Status: AC | PRN
  Administered 2020-11-09: 400 mg via INTRAVENOUS

## 2020-11-09 MED ORDER — SODIUM CHLORIDE 0.9 % IV SOLN: INTRAVENOUS | Status: DC

## 2020-11-09 NOTE — Progress Notes (Signed)
Patient has remained clinically stable post SPT placement/14 Fr., tolerated well. Vitals have remained stable throughout and post procedure. Ate all of lunch, taking po's without difficutly. Received Versed 2 mg along with Fentanyl 50 mcg IV for procedure. Niece/ [name] at bedside with discharge instructions given, as well as spoke with care nurse at [facility], with discharge instructions given. SPT draining 1.2 liter pink tinged/yellow urine, attached to leg bag. Received Cipro 400 mg IV for procedure as well. Patient has remained stable post procedure. Discharged with niece in wheelchair post procedure to return to [place].

## 2020-11-09 NOTE — H&P (Signed)
Chief Complaint: Patient was seen in consultation today for urinary retention  Referring Physician(s): McGowan,Shannon A  Supervising Physician: Arne Cleveland  Patient Status: [facility] - Out-pt  History of Present Illness: Samuel Shelton is a 69 y.o. male with past medical history of prostate cancer and incomplete bladder emptying.  Patient lives in a residential/skilled nursing facility and has a legal guardian, his niece Samuel Shelton, who assists with medical needs.  She recently met with Urology regarding his long-standing urinary retention.  A SPT was recommended based on history with recent UTI.    Samuel Shelton presents to [facility] today with his niece.  He appears alert and comfortable but restless.  Per niece, this is his usual state of health. No concerns today.  Occasionally speaks although not always appropriate to the situation.   Past Medical History:  Diagnosis Date  . Chronic mental illness   . Edema of both legs   . Elevated PSA   . Elevated PSA   . Hypertension   . Mental retardation   . Phimosis     Past Surgical History:  Procedure Laterality Date  . IR REPLC GASTRO/COLONIC TUBE PERCUT W/FLUORO  08/20/2020  . PEG PLACEMENT N/A 01/24/2020   Procedure: PERCUTANEOUS ENDOSCOPIC GASTROSTOMY (PEG) PLACEMENT;  Surgeon: [name], MD;  Location: [facility] ENDOSCOPY;  Service: Endoscopy;  Laterality: N/A;  . PROSTATE BIOPSY N/A 06/22/2015   Procedure: PROSTATE BIOPSY;  Surgeon: Hollice Espy, MD;  Location: [facility] ORS;  Service: Urology;  Laterality: N/A;  . PROSTATE BIOPSY N/A 09/27/2016   Procedure: PROSTATE BIOPSY;  Surgeon: Hollice Espy, MD;  Location: [facility] ORS;  Service: Urology;  Laterality: N/A;    Allergies: Patient has no known allergies.  Medications: Prior to Admission medications   Medication Sig Start Date End Date Taking? Authorizing Provider  acetaminophen (TYLENOL) 500 MG tablet Take 500 mg by mouth every 4 (four) hours as needed for mild pain.    Yes [provider]  aspirin 325 MG tablet Take 1 tablet (325 mg total) by mouth daily. 08/08/20  Yes Dahal, Marlowe Aschoff, MD  calcium-vitamin D (OSCAL WITH D) 500-200 MG-UNIT tablet Take 1 tablet by mouth.   Yes [provider]  doxazosin (CARDURA) 1 MG tablet Take 1 mg by mouth daily.   Yes [provider]  ferrous sulfate 325 (65 FE) MG tablet Take 325 mg by mouth daily with breakfast.   Yes [provider]  finasteride (PROSCAR) 5 MG tablet Take 1 tablet (5 mg total) by mouth daily. 09/05/18  Yes Hollice Espy, MD  pantoprazole (PROTONIX) 40 MG tablet Take 40 mg by mouth daily.   Yes [provider]  Water For Irrigation, Sterile (FREE WATER) SOLN Place 120 mLs into feeding tube every 4 (four) hours. Patient not taking: Reported on 11/09/2020 01/28/20   Fritzi Mandes, MD     Family History  Problem Relation Age of Onset  . Stroke Mother   . Hypertension Sister   . Diabetes type II Sister     Social History   Socioeconomic History  . Marital status: Single    Spouse name: Not on file  . Number of children: Not on file  . Years of education: Not on file  . Highest education level: Not on file  Occupational History  . Not on file  Tobacco Use  . Smoking status: Former Smoker    Packs/day: 0.50    Types: Cigarettes  . Smokeless tobacco: Never Used  Vaping Use  . Vaping Use:  Never used  Substance and Sexual Activity  . Alcohol use: No    Alcohol/week: 0.0 standard drinks  . Drug use: No  . Sexual activity: Not on file  Other Topics Concern  . Not on file  Social History Narrative  . Not on file   Social Determinants of Health   Financial Resource Strain:   . Difficulty of Paying Living Expenses: Not on file  Food Insecurity:   . Worried About Charity fundraiser in the Last Year: Not on file  . Ran Out of Food in the Last Year: Not on file  Transportation Needs:   . Lack of Transportation (Medical): Not on file  . Lack of  Transportation (Non-Medical): Not on file  Physical Activity:   . Days of Exercise per Week: Not on file  . Minutes of Exercise per Session: Not on file  Stress:   . Feeling of Stress : Not on file  Social Connections:   . Frequency of Communication with Friends and Family: Not on file  . Frequency of Social Gatherings with Friends and Family: Not on file  . Attends Religious Services: Not on file  . Active Member of Clubs or Organizations: Not on file  . Attends Archivist Meetings: Not on file  . Marital Status: Not on file     Review of Systems: A 12 point ROS discussed and pertinent positives are indicated in the HPI above.  All other systems are negative.  Review of Systems  Unable to perform ROS: Psychiatric disorder    Vital Signs: BP (!) 137/93   Pulse (!) 117   Temp 98.9 F (37.2 C) (Oral)   Resp 18   SpO2 100%   Physical Exam Vitals and nursing note reviewed.  Constitutional:      General: He is not in acute distress.    Appearance: Normal appearance. He is not ill-appearing.  Cardiovascular:     Rate and Rhythm: Regular rhythm. Tachycardia present.  Pulmonary:     Effort: Pulmonary effort is normal.     Breath sounds: Normal breath sounds.  Abdominal:     General: Abdomen is flat.     Palpations: Abdomen is soft.  Skin:    General: Skin is warm and dry.  Neurological:     General: No focal deficit present.     Mental Status: He is alert and oriented to person, place, and time. Mental status is at baseline.  Psychiatric:        Mood and Affect: Mood normal.        Behavior: Behavior normal.        Thought Content: Thought content normal.        Judgment: Judgment normal.      MD Evaluation Airway: WNL Heart: WNL Abdomen: WNL Chest/ Lungs: WNL ASA  Classification: 3 Mallampati/Airway Score: Two   Imaging: No results found.  Labs:  CBC: Recent Labs    01/25/20 0449 01/25/20 0449 01/25/20 2234 01/25/20 2234 08/03/20 1641  08/03/20 1648 08/03/20 2108 08/06/20 1100  WBC 7.2  --  5.9  --  9.1  --   --  7.1  HGB 10.7*   < > 9.9*   < > 10.7* 11.9* 12.6* 10.1*  HCT 34.2*   < > 30.8*   < > 36.1* 35.0* 37.0* 33.9*  PLT 216  --  223  --  335  --   --  384   < > = values in this interval not  displayed.    COAGS: Recent Labs    08/03/20 1641  INR 1.3*  APTT 36    BMP: Recent Labs    01/28/20 0503 01/28/20 0503 01/29/20 4081 01/29/20 4481 08/03/20 1641 08/03/20 1648 08/03/20 2108 08/06/20 1100 11/03/20 1319  NA 140   < > 142   < > 144 146* 146* 147*  --   K 3.9   < > 4.1   < > 3.8 3.6 4.6 4.5  --   CL 108   < > 108  --  108 110  --  113*  --   CO2 24  --  24  --  24  --   --  23  --   GLUCOSE 104*   < > 108*  --  102* 97  --  103*  --   BUN 33*   < > 33*  --  34* 33*  --  15  --   CALCIUM 8.3*  --  8.7*  --  9.1  --   --  9.1  --   CREATININE 1.22   < > 1.43*   < > 1.51* 1.40*  --  1.03 0.88  GFRNONAA 60*   < > 49*  --  46*  --   --  >60 86  GFRAA >60   < > 57*  --  53*  --   --  >60 100   < > = values in this interval not displayed.    LIVER FUNCTION TESTS: Recent Labs    01/10/20 2111 01/10/20 2111 01/11/20 0716 01/11/20 0716 01/15/20 0550 01/16/20 0326 01/22/20 0704 08/03/20 1641  BILITOT 1.3*  --  1.5*  --  0.9  --   --  0.8  AST 39  --  43*  --  45*  --   --  62*  ALT 27  --  24  --  31  --   --  40  ALKPHOS 76  --  72  --  53  --   --  50  PROT 9.2*  --  8.8*  --  6.5  --   --  7.3  ALBUMIN 3.7   < > 3.3*   < > 2.3* 2.3* 2.6* 2.9*   < > = values in this interval not displayed.    TUMOR MARKERS: No results for input(s): AFPTM, CEA, CA199, CHROMGRNA in the last 8760 hours.  Assessment and Plan: Patient with past medical history of stroke, mental illness/cognitive delay presents with complaint of urinary retention.  IR consulted for suprapubic catheter placement at the request of Zara Council PA-C. Case reviewed by Dr. Vernard Gambles who approves patient for procedure.   Patient presents today in their usual state of health.  He has been NPO and is not currently on blood thinners.    Risks and benefits discussed with the patient including bleeding, infection, damage to adjacent structures, and sepsis.  All of the patient's questions were answered, patient is agreeable to proceed. Consent signed and in chart.  Thank you for this interesting consult.  I greatly enjoyed meeting Samuel Shelton and look forward to participating in their care.  A copy of this report was sent to the requesting provider on this date.  Electronically Signed: Docia Barrier, PA 11/09/2020, 11:12 AM   I spent a total of  30 Minutes   in face to face in clinical consultation, greater than 50% of which was counseling/coordinating care for urinary retention.

## 2020-11-09 NOTE — Procedures (Signed)
  Procedure: CT guided 19F suprapubic catheter placement  EBL:   minimal Complications:  none immediate  See full dictation in [employer].  Dillard Cannon MD Main # [phone] Pager  [phone] Mobile [phone]

## 2020-11-09 NOTE — Discharge Instructions (Signed)
Suprapubic Catheter Home Guide A suprapubic catheter is a flexible tube that is used to drain urine from the bladder into a collection bag outside the body. The catheter is inserted into the bladder through a small opening in the lower abdomen, above the pubic bone (suprapubic area) and a few inches below your belly button (navel). A tiny balloon filled with germ-free (sterile) water helps to keep the catheter in place. The collection bag must be emptied at least once a day and cleaned at least every other day. The collection bag can be put beside your bed at night and attached to your leg during the day. You may have a large collection bag to use at night and a smaller one to use during the day. Your suprapubic catheter may need to be changed every 4-6 weeks, or as often as recommended by your health care provider. Healing of the tract where the catheter is placed can take 6 weeks to 6 months. During that time, your health care provider may change your catheter. Once the tract is well healed, you or a caregiver will change your suprapubic catheter at home. What are the risks? This catheter is safe to use. However, problems can occur, including:  Blocked urine flow. This can occur if the catheter stops working, or if you have a blood clot in your bladder or in the catheter.  Irritation of the skin around the catheter.  Infection. This can happen if bacteria gets into your bladder. Supplies needed:  Two pairs of sterile gloves.  Paper towels.  Catheter.  Two syringes.  Sterile water.  Sterile cleaning solution.  Lubricant.  Collection bags. How to change the catheter  1. Drink plenty of fluids during the hours before you change the catheter. 2. Wash your hands with soap and water. If soap and water are not available, use hand sanitizer. 3. Draw up sterile water into a syringe to have ready to fill the new catheter balloon. The amount will depend on the size of the balloon. 4. Have  all of your supplies ready and close to you on a paper towel. 5. Lie on your back, sitting slightly upright so that you can see the catheter and opening. 6. Put on sterile gloves. 7. Clean the skin around the catheter opening using the sterile cleaning solution. 8. Remove the water from the balloon in the catheter using a syringe. 9. Slowly remove the catheter. If the catheter seems stuck, or if you have difficulty removing it: ? Do not pull on it. ? Call your health care provider right away. 10. Place the old catheter on a paper towel to discard later. 11. Take off the used gloves, and put on a new pair. 12. Put lubricant on the end of the new catheter that will go into your bladder. 13. Clean the skin around the catheter opening using the sterile cleaning solution. 14. Gently slide the catheter through the opening in your abdomen and into the tract that leads to your bladder. 15. Wait for some urine to start flowing through the catheter. 16. When urine starts to flow through the catheter, attach the collection bag to the end of the catheter. Make sure the connection is tight. 17. Use a syringe to fill the catheter balloon with sterile water. Fill to the amount directed by your health care provider. 18. Remove the gloves and wash your hands with soap and water. How to care for the skin around the catheter Follow your health care provider's instructions on  caring for your skin.  Use a clean washcloth and soapy water to clean the skin around your catheter every day. Pat the area dry with a clean paper towel.  Do not pull on the catheter.  Do not use ointment or lotion on this area, unless told by your health care provider.  Check the skin around the catheter every day for signs of infection. Check for: ? Redness, swelling, or pain. ? Fluid or blood. ? Warmth. ? Pus or a bad smell. How to empty and clean the collection bag Empty the large collection bag every 8 hours. Empty the small  collection bag when it is about ? full. Clean the collection bag every 2-3 days, or as often as told by your health care provider. To do this: 1. Wash your hands with soap and water. If soap and water are not available, use hand sanitizer. 2. Disconnect the bag from the catheter and immediately attach a new bag to the catheter. 3. Hold the used bag over the toilet or another container. 4. Turn the valve (spigot) at the bottom of the bag to empty the urine. Empty the used bag completely. ? Do not touch the opening of the spigot. ? Do not let the opening touch the toilet or container. 5. Close the spigot tightly when the bag is empty. 6. Clean the used bag in one of the following methods: ? According to the manufacturer's instructions. ? As told by your health care provider. 7. Let the bag dry completely. Put it in a clean plastic bag before storing it. General tips   Always wash your hands before and after caring for your catheter and collection bag. Use a mild, fragrance-free soap. If soap and water are not available, use hand sanitizer.  Clean the outside of the catheter with soap and water as often as told by your health care provider.  Always make sure there are no twists or kinks in the catheter tube.  Always make sure there are no leaks in the catheter or collection bag.  Always wear the leg bag below your knee.  Make sure the overnight drainage bag is always lower than the level of your bladder, but do not let it touch the floor. Before you go to sleep, hang the bag inside a wastebasket that is covered by a clean plastic bag.  Drink enough fluid to keep your urine pale yellow.  Do not take baths, swim, or use a hot tub until your health care provider approves. Ask your health care provider if you may take showers. Contact a heath care provider if:  You leak urine.  You have redness, swelling, or pain around your catheter.  You have fluid or blood coming from your catheter  opening.  Your catheter opening feels warm to the touch.  You have pus or a bad smell coming from your catheter opening.  You have a fever or chills.  Your urine flow slows down.  Your urine becomes cloudy or smelly. Get help right away if:  Your catheter comes out.  You have: ? Nausea. ? Back pain. ? Difficulty changing your catheter. ? Blood in your urine. ? No urine flow for 1 hour. Summary  A suprapubic catheter is a flexible tube that is used to drain urine from the bladder into a collection bag outside the body.  Your suprapubic catheter may need to be changed every 4-6 weeks, or as recommended by your health care provider.  Follow instructions on how to  change the catheter and how to empty and clean the collection bag.  Always wash your hands before and after caring for your catheter and collection bag. Drink enough fluid to keep your urine pale yellow.  Get help right away if you have difficulty changing your catheter or if there is blood in your urine. This information is not intended to replace advice given to you by your health care provider. Make sure you discuss any questions you have with your health care provider. Document Revised: 04/04/2019 Document Reviewed: 01/16/2019 Elsevier Patient Education  [employer].

## 2020-11-17 ENCOUNTER — Other Ambulatory Visit: Payer: Self-pay | Admitting: Radiology

## 2020-11-17 DIAGNOSIS — R339 Retention of urine, unspecified: Secondary | ICD-10-CM

## 2020-11-18 ENCOUNTER — Telehealth: Payer: Self-pay | Admitting: Radiology

## 2020-11-18 NOTE — Telephone Encounter (Signed)
LMOM for HCPOA [name] to return call regarding patient's appointment for suprapubic tube exchange scheduled 12/04/2020 with arrival of 9:00 to Admitting and Registration at [facility]. Patient needs to be npo after midnight for this procedure and HCPOA will need to be present. Made Patty K at [facility] aware of information.

## 2020-12-02 NOTE — OR Nursing (Signed)
Pt's Niece (POA) called verbalizing concern about her Uncles suprapubic catheter. Pt pulled it out 11/27/20. She said the facility was supposed to call the physician and notify them. She also verbalizes concerns that he may have a UTI. I encouraged her to call Dr Cherrie Gauze office tomorrow am to let them know the tube is out and he may have UTI. If so the order would need to be changed to tube placement and antibiotic ordered if he has infection. She acknowledged the information I gave her.

## 2020-12-03 ENCOUNTER — Other Ambulatory Visit: Payer: Self-pay | Admitting: Radiology

## 2020-12-03 DIAGNOSIS — R339 Retention of urine, unspecified: Secondary | ICD-10-CM

## 2020-12-04 ENCOUNTER — Ambulatory Visit (INDEPENDENT_AMBULATORY_CARE_PROVIDER_SITE_OTHER): Payer: Medicare Other | Admitting: Urology

## 2020-12-04 ENCOUNTER — Other Ambulatory Visit: Payer: Self-pay

## 2020-12-04 ENCOUNTER — Ambulatory Visit: Payer: Medicare Other

## 2020-12-04 ENCOUNTER — Ambulatory Visit
Admission: RE | Admit: 2020-12-04 | Discharge: 2020-12-04 | Disposition: A | Discharge status: Home / Self Care | Payer: Medicare Other | Source: Ambulatory Visit | Attending: Urology | Admitting: Urology

## 2020-12-04 VITALS — BP 122/78 | HR 109 | Temp 97.9°F

## 2020-12-04 DIAGNOSIS — R339 Retention of urine, unspecified: Secondary | ICD-10-CM | POA: Diagnosis not present

## 2020-12-04 LAB — BLADDER SCAN AMB NON-IMAGING: Scan Result: 306

## 2020-12-04 NOTE — Progress Notes (Signed)
10/20/2020 11:00 AM   Samuel Shelton 1951-12-16 [id number]  Referring provider: Bonnita Nasuti, MD [address] [place],  [place] [address] Chief Complaint  Patient presents with  . Urinary Retention    HPI: Samuel Shelton is a 69 y.o. male with prostate cancer and incomplete bladder emptying who pulled his SPT out two days prior.  Patient is accompanied by his health care proxy, his niece Samuel Shelton.    Patient removed his SPT two days ago and his SNIF has reached out for further evaluation.  His niece states there are not able to give her any information regarding her uncle.  She is concerned about the care he is receiving at the facility as he is wearing the same clothes that he had on two days prior.    Samuel Shelton is agitated at today's visit.   He is trying to roll his wheelchair out of the exam room during the visit.  His PVR is 306 mL.  His niece is not aware of any gross hematuria, dysuria or suprapubic/flank pain.  Patient denies any fevers, chills, nausea or vomiting, but she states the staff cannot give her information.    PMH: Past Medical History:  Diagnosis Date  . Chronic mental illness   . Edema of both legs   . Elevated PSA   . Elevated PSA   . Hypertension   . Mental retardation   . Phimosis     Surgical History: Past Surgical History:  Procedure Laterality Date  . IR REPLC GASTRO/COLONIC TUBE PERCUT W/FLUORO  08/20/2020  . PEG PLACEMENT N/A 01/24/2020   Procedure: PERCUTANEOUS ENDOSCOPIC GASTROSTOMY (PEG) PLACEMENT;  Surgeon: [name], MD;  Location: [facility] ENDOSCOPY;  Service: Endoscopy;  Laterality: N/A;  . PROSTATE BIOPSY N/A 06/22/2015   Procedure: PROSTATE BIOPSY;  Surgeon: Hollice Espy, MD;  Location: [facility] ORS;  Service: Urology;  Laterality: N/A;  . PROSTATE BIOPSY N/A 09/27/2016   Procedure: PROSTATE BIOPSY;  Surgeon: Hollice Espy, MD;  Location: [facility] ORS;  Service: Urology;  Laterality: N/A;    Home Medications:  Allergies as of  12/04/2020   No Known Allergies     Medication List       Accurate as of December 04, 2020 11:00 AM. If you have any questions, ask your nurse or doctor.        acetaminophen 500 MG tablet Commonly known as: TYLENOL Take 500 mg by mouth every 4 (four) hours as needed for mild pain.   aspirin 325 MG tablet Take 1 tablet (325 mg total) by mouth daily.   calcium-vitamin D 500-200 MG-UNIT tablet Commonly known as: OSCAL WITH D Take 1 tablet by mouth.   doxazosin 1 MG tablet Commonly known as: CARDURA Take 1 mg by mouth daily.   ferrous sulfate 325 (65 FE) MG tablet Take 325 mg by mouth daily with breakfast.   finasteride 5 MG tablet Commonly known as: PROSCAR Take 1 tablet (5 mg total) by mouth daily.   free water Soln Place 120 mLs into feeding tube every 4 (four) hours.   pantoprazole 40 MG tablet Commonly known as: PROTONIX Take 40 mg by mouth daily.       Allergies: No Known Allergies  Family History: Family History  Problem Relation Age of Onset  . Stroke Mother   . Hypertension Sister   . Diabetes type II Sister     Social History:  reports that he has quit smoking. His smoking use included cigarettes. He smoked 0.50 packs per  day. He has never used smokeless tobacco. He reports that he does not drink alcohol and does not use drugs.   Physical Exam: BP 122/78   Pulse (!) 109   Temp 97.9 F (36.6 C)   Constitutional:  Wrinkled clothes.  Alert.   HEENT: [place] AT.  Moist mucus membranes.  Trachea midline Cardiovascular: No clubbing, cyanosis, or edema. Respiratory: Normal respiratory effort, no increased work of breathing. GI: Abdomen is soft, non tender, non distended, no abdominal masses.  GU: No CVA tenderness.  No bladder fullness or masses.   Neurologic: Grossly intact, no focal deficits, moving all 4 extremities.  In wheelchair. Psychiatric: Agitated  Laboratory Data: Lab Results  Component Value Date   CREATININE 0.88 11/03/2020    Lab  Results  Component Value Date   HGBA1C 5.3 08/04/2020  I have reviewed the labs.   Assessment & Plan:    1. Prostate cancer PSA dropped to 5.2 on 09/2020 with appropriate use of finasteride. Unable to tolerate DRE.  Continue Flomax and finasteride.   2. Incomplete bladder emptying  Chronic and worsening, otherwise asymptomatic.  Suspect this is related to recent stroke.  Today's PVR is 300 mL, due to his agitation did not pursue cath placement Patient's healthcare proxy would like to replace the SPT at this time   [place] [address] [place], [place] [address] [phone]  [facility], PA-C  She is also given the number for social services, so that she may voice her concerns regarding his uncle's care at his facility.

## 2020-12-07 ENCOUNTER — Other Ambulatory Visit: Payer: Self-pay | Admitting: Radiology

## 2020-12-08 ENCOUNTER — Other Ambulatory Visit: Payer: Self-pay

## 2020-12-08 ENCOUNTER — Ambulatory Visit
Admission: RE | Admit: 2020-12-08 | Discharge: 2020-12-08 | Disposition: A | Discharge status: Home / Self Care | Payer: Medicare Other | Source: Ambulatory Visit | Attending: Urology | Admitting: Urology

## 2020-12-08 DIAGNOSIS — Z466 Encounter for fitting and adjustment of urinary device: Secondary | ICD-10-CM | POA: Insufficient documentation

## 2020-12-08 DIAGNOSIS — Z8249 Family history of ischemic heart disease and other diseases of the circulatory system: Secondary | ICD-10-CM | POA: Insufficient documentation

## 2020-12-08 DIAGNOSIS — Z79899 Other long term (current) drug therapy: Secondary | ICD-10-CM | POA: Diagnosis not present

## 2020-12-08 DIAGNOSIS — I10 Essential (primary) hypertension: Secondary | ICD-10-CM | POA: Diagnosis not present

## 2020-12-08 DIAGNOSIS — F79 Unspecified intellectual disabilities: Secondary | ICD-10-CM | POA: Insufficient documentation

## 2020-12-08 DIAGNOSIS — R339 Retention of urine, unspecified: Secondary | ICD-10-CM | POA: Diagnosis present

## 2020-12-08 DIAGNOSIS — Z87891 Personal history of nicotine dependence: Secondary | ICD-10-CM | POA: Insufficient documentation

## 2020-12-08 DIAGNOSIS — Z8546 Personal history of malignant neoplasm of prostate: Secondary | ICD-10-CM | POA: Diagnosis not present

## 2020-12-08 LAB — GLUCOSE, CAPILLARY: Glucose-Capillary: 86 mg/dL (ref 70–99)

## 2020-12-08 MED ORDER — MIDAZOLAM HCL 2 MG/2ML IJ SOLN
INTRAMUSCULAR | Status: AC
  Filled 2020-12-08: qty 2

## 2020-12-08 MED ORDER — MIDAZOLAM HCL 2 MG/ML PO SYRP
4.0000 mg | ORAL_SOLUTION | Freq: Once | ORAL | Status: DC
  Filled 2020-12-08: qty 2

## 2020-12-08 MED ORDER — CIPROFLOXACIN IN D5W 400 MG/200ML IV SOLN
400.0000 mg | Freq: Once | INTRAVENOUS | Status: DC
  Filled 2020-12-08: qty 200

## 2020-12-08 MED ORDER — MIDAZOLAM HCL 2 MG/ML PO SYRP
ORAL_SOLUTION | ORAL | Status: AC
  Administered 2020-12-08: 4 mg
  Filled 2020-12-08: qty 4

## 2020-12-08 MED ORDER — SODIUM CHLORIDE 0.9 % IV SOLN: INTRAVENOUS | Status: DC

## 2020-12-08 MED ORDER — FENTANYL CITRATE (PF) 100 MCG/2ML IJ SOLN
INTRAMUSCULAR | Status: AC
  Administered 2020-12-08: 50 ug via INTRAVENOUS
  Filled 2020-12-08: qty 2

## 2020-12-08 MED ORDER — FENTANYL CITRATE (PF) 100 MCG/2ML IJ SOLN
50.0000 ug | Freq: Once | INTRAMUSCULAR | Status: AC
  Filled 2020-12-08: qty 1

## 2020-12-08 NOTE — H&P (Signed)
Chief Complaint: Patient was seen in consultation today for suprapubic catheter replacement.  Referring Physician(s): McGowan,Coree Riester A  Supervising Physician: Markus Daft  Patient Status: [facility] - Out-pt  History of Present Illness: Samuel Shelton is a 69 y.o. male with a past medical history significant for mental retardation, HTN, phimosis, prostate cancer and urinary retention who presents today to have his suprapubic catheter replaced. Samuel Shelton was seen in IR on 11/09/20 for initial SP catheter placement, however upon return to SNF he pulled the catheter out on 12/8. He was seen by urology on 12/10 and due to agitation no attempt was made to replace the catheter in their office. He has been referred back to IR for replacement with moderate sedation.   Samuel Shelton is agitated on exam today, he continuously removes pulse oximeter and EKG leads. He does not allow staff to assess his upper extremities for IV placement. He follows commands to open his mouth and is asking that we sing "Jesus" which his niece tells me means Christmas songs. Discussed procedure with patient's niece [name] today who is agreeable to proceed however she expresses concerns about his care at the facility and she believes that he has a UTI. She does not know who his PCP is but states there is a physician who comes to the facility but doesn't see him, she reports trying to contact the CEO of the SNF to find out who is supposed to be seeing him but has not been able to gain any information. She is asking if we can test his urine today.   Past Medical History:  Diagnosis Date  . Chronic mental illness   . Edema of both legs   . Elevated PSA   . Elevated PSA   . Hypertension   . Mental retardation   . Phimosis     Past Surgical History:  Procedure Laterality Date  . IR REPLC GASTRO/COLONIC TUBE PERCUT W/FLUORO  08/20/2020  . PEG PLACEMENT N/A 01/24/2020   Procedure: PERCUTANEOUS ENDOSCOPIC GASTROSTOMY (PEG)  PLACEMENT;  Surgeon: [name], MD;  Location: [facility] ENDOSCOPY;  Service: Endoscopy;  Laterality: N/A;  . PROSTATE BIOPSY N/A 06/22/2015   Procedure: PROSTATE BIOPSY;  Surgeon: Hollice Espy, MD;  Location: [facility] ORS;  Service: Urology;  Laterality: N/A;  . PROSTATE BIOPSY N/A 09/27/2016   Procedure: PROSTATE BIOPSY;  Surgeon: Hollice Espy, MD;  Location: [facility] ORS;  Service: Urology;  Laterality: N/A;    Allergies: Patient has no known allergies.  Medications: Prior to Admission medications   Medication Sig Start Date End Date Taking? Authorizing Provider  acetaminophen (TYLENOL) 500 MG tablet Take 500 mg by mouth every 4 (four) hours as needed for mild pain.    [provider]  aspirin 325 MG tablet Take 1 tablet (325 mg total) by mouth daily. 08/08/20   Dahal, Marlowe Aschoff, MD  calcium-vitamin D (OSCAL WITH D) 500-200 MG-UNIT tablet Take 1 tablet by mouth.    [provider]  doxazosin (CARDURA) 1 MG tablet Take 1 mg by mouth daily.    [provider]  ferrous sulfate 325 (65 FE) MG tablet Take 325 mg by mouth daily with breakfast.    [provider]  finasteride (PROSCAR) 5 MG tablet Take 1 tablet (5 mg total) by mouth daily. 09/05/18   Hollice Espy, MD  pantoprazole (PROTONIX) 40 MG tablet Take 40 mg by mouth daily.    [provider]  Water For Irrigation, Sterile (FREE WATER) SOLN Place 120 mLs into feeding tube  every 4 (four) hours. 01/28/20   Fritzi Mandes, MD     Family History  Problem Relation Age of Onset  . Stroke Mother   . Hypertension Sister   . Diabetes type II Sister     Social History   Socioeconomic History  . Marital status: Single    Spouse name: Not on file  . Number of children: Not on file  . Years of education: Not on file  . Highest education level: Not on file  Occupational History  . Not on file  Tobacco Use  . Smoking status: Former Smoker    Packs/day: 0.50    Types: Cigarettes  . Smokeless tobacco: Never  Used  Vaping Use  . Vaping Use: Never used  Substance and Sexual Activity  . Alcohol use: No    Alcohol/week: 0.0 standard drinks  . Drug use: No  . Sexual activity: Not on file  Other Topics Concern  . Not on file  Social History Narrative  . Not on file   Social Determinants of Health   Financial Resource Strain: Not on file  Food Insecurity: Not on file  Transportation Needs: Not on file  Physical Activity: Not on file  Stress: Not on file  Social Connections: Not on file     Review of Systems: A 12 point ROS discussed and pertinent positives are indicated in the HPI above.  All other systems are negative.  Review of Systems  Unable to perform ROS: Patient nonverbal    Vital Signs: BP (!) 139/92   Pulse (!) 102   Temp 99.8 F (37.7 C) (Oral)   Resp 20   Ht 5\' 5"  (1.651 m)   Wt 173 lb 1 oz (78.5 kg)   SpO2 97%   BMI 28.80 kg/m   Physical Exam Vitals reviewed.  Constitutional:      General: He is not in acute distress.    Appearance: He is not ill-appearing.     Comments: Agitated  HENT:     Head: Normocephalic.     Mouth/Throat:     Mouth: Mucous membranes are moist.     Pharynx: Oropharynx is clear. No oropharyngeal exudate or posterior oropharyngeal erythema.     Comments: Edentulous Cardiovascular:     Rate and Rhythm: Regular rhythm. Tachycardia present.  Pulmonary:     Effort: Pulmonary effort is normal.     Breath sounds: Normal breath sounds.  Abdominal:     General: There is no distension.     Palpations: Abdomen is soft.     Tenderness: There is no abdominal tenderness.  Skin:    General: Skin is warm and dry.  Neurological:     Mental Status: He is alert. Mental status is at baseline.      MD Evaluation Airway: WNL (edentulous) Heart: WNL Abdomen: WNL Chest/ Lungs: WNL ASA  Classification: 3 Mallampati/Airway Score: Two   Imaging: CT IMAGE GUIDED DRAINAGE BY PERCUTANEOUS CATHETER  Result Date: 11/09/2020 CLINICAL DATA:   Urinary retention EXAM: CT GUIDED SUPRAPUBIC CATHETER PLACEMENT COMPARISON:  CT 01/24/2020 ANESTHESIA/SEDATION: Intravenous Fentanyl 16mcg and Versed 2mg  were administered as conscious sedation during continuous monitoring of the patient's level of consciousness and physiological / cardiorespiratory status by the radiology RN, with a total moderate sedation time of 22 minutes. PROCEDURE: Informed written consent was obtained from the niece after a thorough discussion of the procedural risks, benefits and alternatives. All questions were addressed. Maximal Sterile Barrier Technique was utilized including caps, mask, sterile gowns, sterile  gloves, sterile drape, hand hygiene and skin antiseptic. A timeout was performed prior to the initiation of the procedure. Select axial CT scans were obtained through the urinary bladder. An appropriate skin entry site was determined and marked. The bladder was physiologically distended so no Foley catheter placement was needed. Skin site was prepped with chlorhexidine, draped in usual sterile fashion, infiltrated locally with 1% lidocaine. Under intermittent CT fluoroscopic guidance, an 18 gauge trocar needle was advanced into the urinary bladder. Urine returned through the needle hub. Amplatz guidewire advanced easily, position confirmed on CT. Tract dilated to facilitate placement of a 14 French pigtail catheter, formed within the lumen of the urinary bladder. Position confirmed on CT. Catheter secured externally with 0 Prolene suture and StatLock and placed to gravity drain bag. The patient tolerated the procedure well. COMPLICATIONS: None immediate. IMPRESSION: 1. Technically successful CT-guided suprapubic catheter placement. Electronically Signed   By: Lucrezia Europe M.D.   On: 11/09/2020 14:10    Labs:  CBC: Recent Labs    01/25/20 0449 01/25/20 2234 08/03/20 1641 08/03/20 1648 08/03/20 2108 08/06/20 1100  WBC 7.2 5.9 9.1  --   --  7.1  HGB 10.7* 9.9* 10.7* 11.9*  12.6* 10.1*  HCT 34.2* 30.8* 36.1* 35.0* 37.0* 33.9*  PLT 216 223 335  --   --  384    COAGS: Recent Labs    08/03/20 1641  INR 1.3*  APTT 36    BMP: Recent Labs    01/28/20 0503 01/29/20 0642 08/03/20 1641 08/03/20 1648 08/03/20 2108 08/06/20 1100 11/03/20 1319  NA 140 142 144 146* 146* 147*  --   K 3.9 4.1 3.8 3.6 4.6 4.5  --   CL 108 108 108 110  --  113*  --   CO2 24 24 24   --   --  23  --   GLUCOSE 104* 108* 102* 97  --  103*  --   BUN 33* 33* 34* 33*  --  15  --   CALCIUM 8.3* 8.7* 9.1  --   --  9.1  --   CREATININE 1.22 1.43* 1.51* 1.40*  --  1.03 0.88  GFRNONAA 60* 49* 46*  --   --  >60 86  GFRAA >60 57* 53*  --   --  >60 100    LIVER FUNCTION TESTS: Recent Labs    01/10/20 2111 01/11/20 0716 01/15/20 0550 01/16/20 0326 01/22/20 0704 08/03/20 1641  BILITOT 1.3* 1.5* 0.9  --   --  0.8  AST 39 43* 45*  --   --  62*  ALT 27 24 31   --   --  40  ALKPHOS 76 72 53  --   --  50  PROT 9.2* 8.8* 6.5  --   --  7.3  ALBUMIN 3.7 3.3* 2.3* 2.3* 2.6* 2.9*    TUMOR MARKERS: No results for input(s): AFPTM, CEA, CA199, CHROMGRNA in the last 8760 hours.  Assessment and Plan:  69 y/o M with history of prostate cancer and urinary retention s/p SP catheter placement 11/09/20 in IR with subsequent removal by patient at SNF on 12/8 who presents today for SP catheter replacement with moderate sedation.  Patient has been NPO since midnight per facility report, no current anticoagulation/antiplatelet medications.   Per review of CareEverywhere patient's PCP is Caleen Essex, MD of [facility] and Mr. Sweeney was last seen by their service on 01/08/20 within the facility - per note plans are to  continue current medication regimen with Dr. Ouida Sills to reorder as needed and to "try not to overburden the patient with testing and non essential treatments." Patient's niece was given contact information for this PCP today so that she may follow  up.  Risks and benefits discussed with the patient including bleeding, infection, damage to adjacent structures, perforation/fistula connection, and sepsis.  All of the patient's questions were answered, patient is agreeable to proceed.  Consent signed and in chart.  Thank you for this interesting consult.  I greatly enjoyed meeting Melquisedec Journey and look forward to participating in their care.  A copy of this report was sent to the requesting provider on this date.  Electronically Signed: Joaquim Nam, PA-C 12/08/2020, 9:05 AM   I spent a total of  25 Minutes in face to face in clinical consultation, greater than 50% of which was counseling/coordinating care for SP catheter replacement.

## 2020-12-08 NOTE — Progress Notes (Signed)
Patient/Samuel Shelton arriving for SPT placement accompanied by niece/[name] @0935 . As from prior history, contractured/rigid,and agitated upon arrival. In order to attempt procedure, PA/Shannon asked to come and assess patient, and Versed 4mg  po given as noted in order to attempt getting IV placement. Vitals have remained stable during stay here in specials. Versed not effective,thus Dr Anselm Pancoast  In to evaluate patient. IV placed left forearm, Fentanyl 60mcg given IV per DR Henn's request in effort to get patient relaxed to turn to back. After that still not able to turn patient to back in order to be able to do procedure. Dr Anselm Pancoast spoke with niece with update given, not able to do procedure. Dr Anselm Pancoast contacted anesthesia to assess for availability to do SPT with anesthesia. Not able to do procedure today, thus urology contacted per Dr Anselm Pancoast, to discuss setting up time for SPT to be done with anesthesia.they will contact niece.patient diaper saturated with urine, changed to dry diaper prior to discharge. Vitals have remained stable during patient's stay in specials.

## 2020-12-09 ENCOUNTER — Ambulatory Visit: Payer: Medicare Other | Admitting: Urology

## 2020-12-10 ENCOUNTER — Encounter: Payer: Self-pay | Admitting: Urology

## 2020-12-11 ENCOUNTER — Ambulatory Visit: Payer: Medicare Other

## 2020-12-11 ENCOUNTER — Other Ambulatory Visit: Payer: Medicare Other

## 2020-12-14 ENCOUNTER — Encounter (HOSPITAL_COMMUNITY): Payer: Self-pay | Admitting: Interventional Radiology

## 2020-12-14 ENCOUNTER — Other Ambulatory Visit: Payer: Self-pay | Admitting: Student

## 2020-12-14 NOTE — Progress Notes (Signed)
Completed preop call with Earnest Bailey, RN at [facility].  Faxed instructions to [phone]

## 2020-12-14 NOTE — Progress Notes (Signed)
  [address], [place] [address] Phone: [phone] Fax: [phone]      Your procedure is scheduled on 11/15/20.  Report to [facility] Main Entrance "A" at 9:15 A.M., and check in at the Admitting office.  Call this number if you have problems the morning of surgery:  [phone]  Call [phone] if you have any questions prior to your surgery date Monday-Friday 8am-4pm    Remember:  Do not eat or drink after midnight the night before your surgery    Take these medicines the morning of surgery with A SIP OF WATER: Cardura Finasteride Protonix  If Needed: Tylenol  As of today, STOP taking any Aspirin (unless otherwise instructed by your surgeon) Aleve, Naproxen, Ibuprofen, Motrin, Advil, Goody's, BC's, all herbal medications, fish oil, and all vitamins.                      Do not wear jewelry            Do not wear lotions, powders, colognes, or deodorant.            Do not shave 48 hours prior to surgery.  Men may shave face and neck.            Do not bring valuables to the hospital.            [facility] is not responsible for any belongings or valuables.  Do NOT Smoke (Tobacco/Vaping) or drink Alcohol 24 hours prior to your procedure If you use a CPAP at night, you may bring all equipment for your overnight stay.   Contacts, glasses, dentures or bridgework may not be worn into surgery.      For patients admitted to the hospital, discharge time will be determined by your treatment team.   Patients discharged the day of surgery will not be allowed to drive home, and someone needs to stay with them for 24 hours.   Oral Hygiene is also important to reduce your risk of infection.  Remember - BRUSH YOUR TEETH THE MORNING OF SURGERY WITH YOUR REGULAR TOOTHPASTE   Day of Surgery: Wear Clean/Comfortable clothing the morning of surgery Do not apply any deodorants/lotions.   Remember to brush your  teeth WITH YOUR REGULAR TOOTHPASTE.

## 2020-12-15 ENCOUNTER — Ambulatory Visit (HOSPITAL_COMMUNITY): Payer: Medicare Other | Admitting: Certified Registered"

## 2020-12-15 ENCOUNTER — Other Ambulatory Visit: Payer: Self-pay

## 2020-12-15 ENCOUNTER — Ambulatory Visit (HOSPITAL_COMMUNITY)
Admission: RE | Admit: 2020-12-15 | Discharge: 2020-12-15 | Disposition: A | Discharge status: Home / Self Care | Payer: Medicare Other | Source: Ambulatory Visit | Attending: Urology | Admitting: Urology

## 2020-12-15 ENCOUNTER — Encounter (HOSPITAL_COMMUNITY)
Admission: RE | Disposition: A | Discharge status: Home / Self Care | Payer: Self-pay | Source: Home / Self Care | Attending: Interventional Radiology

## 2020-12-15 ENCOUNTER — Encounter (HOSPITAL_COMMUNITY): Payer: Self-pay

## 2020-12-15 ENCOUNTER — Ambulatory Visit (HOSPITAL_COMMUNITY)
Admission: RE | Admit: 2020-12-15 | Discharge: 2020-12-15 | Disposition: A | Discharge status: Home / Self Care | Payer: Medicare Other | Attending: Interventional Radiology | Admitting: Interventional Radiology

## 2020-12-15 ENCOUNTER — Encounter (HOSPITAL_COMMUNITY): Payer: Self-pay | Admitting: Interventional Radiology

## 2020-12-15 DIAGNOSIS — Z823 Family history of stroke: Secondary | ICD-10-CM | POA: Insufficient documentation

## 2020-12-15 DIAGNOSIS — Z8546 Personal history of malignant neoplasm of prostate: Secondary | ICD-10-CM | POA: Diagnosis not present

## 2020-12-15 DIAGNOSIS — Z20822 Contact with and (suspected) exposure to covid-19: Secondary | ICD-10-CM | POA: Diagnosis not present

## 2020-12-15 DIAGNOSIS — Z8249 Family history of ischemic heart disease and other diseases of the circulatory system: Secondary | ICD-10-CM | POA: Diagnosis not present

## 2020-12-15 DIAGNOSIS — R339 Retention of urine, unspecified: Secondary | ICD-10-CM | POA: Insufficient documentation

## 2020-12-15 DIAGNOSIS — I10 Essential (primary) hypertension: Secondary | ICD-10-CM | POA: Insufficient documentation

## 2020-12-15 DIAGNOSIS — Z833 Family history of diabetes mellitus: Secondary | ICD-10-CM | POA: Insufficient documentation

## 2020-12-15 DIAGNOSIS — Z79899 Other long term (current) drug therapy: Secondary | ICD-10-CM | POA: Diagnosis not present

## 2020-12-15 DIAGNOSIS — Z7982 Long term (current) use of aspirin: Secondary | ICD-10-CM | POA: Insufficient documentation

## 2020-12-15 DIAGNOSIS — F79 Unspecified intellectual disabilities: Secondary | ICD-10-CM | POA: Insufficient documentation

## 2020-12-15 DIAGNOSIS — Z87440 Personal history of urinary (tract) infections: Secondary | ICD-10-CM | POA: Insufficient documentation

## 2020-12-15 HISTORY — PX: IR GUIDED DRAIN W CATHETER PLACEMENT: IMG719

## 2020-12-15 HISTORY — PX: RADIOLOGY WITH ANESTHESIA: SHX6223

## 2020-12-15 LAB — PROTIME-INR
INR: 1.1 (ref 0.8–1.2)
Prothrombin Time: 13.9 seconds (ref 11.4–15.2)

## 2020-12-15 LAB — BASIC METABOLIC PANEL
Anion gap: 11 (ref 5–15)
BUN: 21 mg/dL (ref 8–23)
CO2: 22 mmol/L (ref 22–32)
Calcium: 9.3 mg/dL (ref 8.9–10.3)
Chloride: 111 mmol/L (ref 98–111)
Creatinine, Ser: 1.11 mg/dL (ref 0.61–1.24)
GFR, Estimated: >60 mL/min (ref >60)
Glucose, Bld: 99 mg/dL (ref 70–99)
Potassium: 3.7 mmol/L (ref 3.5–5.1)
Sodium: 144 mmol/L (ref 135–145)

## 2020-12-15 LAB — CBC
HCT: 39 % (ref 39.0–52.0)
Hemoglobin: 11.3 g/dL — ABNORMAL LOW (ref 13.0–17.0)
MCH: 25.8 pg — ABNORMAL LOW (ref 26.0–34.0)
MCHC: 29 g/dL — ABNORMAL LOW (ref 30.0–36.0)
MCV: 89 fL (ref 80.0–100.0)
Platelets: 387 10*3/uL (ref 150–400)
RBC: 4.38 MIL/uL (ref 4.22–5.81)
RDW: 16.2 % — ABNORMAL HIGH (ref 11.5–15.5)
WBC: 4.8 10*3/uL (ref 4.0–10.5)
nRBC: 0 % (ref 0.0–0.2)

## 2020-12-15 LAB — SARS CORONAVIRUS 2 BY RT PCR (HOSPITAL ORDER, PERFORMED IN ~~LOC~~ HOSPITAL LAB): SARS Coronavirus 2: NEGATIVE

## 2020-12-15 SURGERY — IR WITH ANESTHESIA
Anesthesia: General

## 2020-12-15 MED ORDER — ACETAMINOPHEN 160 MG/5ML PO SOLN: 650.0000 mg | Freq: Once | ORAL | Status: DC

## 2020-12-15 MED ORDER — LACTATED RINGERS IV SOLN: INTRAVENOUS | Status: DC | PRN

## 2020-12-15 MED ORDER — ONDANSETRON HCL 4 MG/2ML IJ SOLN
INTRAMUSCULAR | Status: DC | PRN
  Administered 2020-12-15: 4 mg via INTRAVENOUS

## 2020-12-15 MED ORDER — ACETAMINOPHEN 160 MG/5ML PO SOLN: 650.0000 mg | Freq: Once | ORAL | Status: AC

## 2020-12-15 MED ORDER — LIDOCAINE 2% (20 MG/ML) 5 ML SYRINGE
INTRAMUSCULAR | Status: AC
  Filled 2020-12-15: qty 5

## 2020-12-15 MED ORDER — MIDAZOLAM HCL 2 MG/ML PO SYRP: 12.0000 mg | ORAL_SOLUTION | Freq: Once | ORAL | Status: AC

## 2020-12-15 MED ORDER — FENTANYL CITRATE (PF) 100 MCG/2ML IJ SOLN: 25.0000 ug | INTRAMUSCULAR | Status: DC | PRN

## 2020-12-15 MED ORDER — PROPOFOL 500 MG/50ML IV EMUL
INTRAVENOUS | Status: DC | PRN
  Administered 2020-12-15: 80 ug/kg/min via INTRAVENOUS

## 2020-12-15 MED ORDER — ACETAMINOPHEN 10 MG/ML IV SOLN: 1000.0000 mg | Freq: Once | INTRAVENOUS | Status: DC | PRN

## 2020-12-15 MED ORDER — CIPROFLOXACIN IN D5W 400 MG/200ML IV SOLN
400.0000 mg | Freq: Once | INTRAVENOUS | Status: AC
  Administered 2020-12-15: 400 mg via INTRAVENOUS

## 2020-12-15 MED ORDER — MIDAZOLAM HCL 2 MG/ML PO SYRP
ORAL_SOLUTION | ORAL | Status: AC
  Administered 2020-12-15: 12 mg via ORAL
  Filled 2020-12-15: qty 6

## 2020-12-15 MED ORDER — FENTANYL CITRATE (PF) 250 MCG/5ML IJ SOLN
INTRAMUSCULAR | Status: DC | PRN
  Administered 2020-12-15 (×5): 25 ug via INTRAVENOUS

## 2020-12-15 MED ORDER — LIDOCAINE-EPINEPHRINE 1 %-1:100000 IJ SOLN
INTRAMUSCULAR | Status: AC
  Filled 2020-12-15: qty 1

## 2020-12-15 MED ORDER — MIDAZOLAM HCL 2 MG/2ML IJ SOLN
INTRAMUSCULAR | Status: DC | PRN
  Administered 2020-12-15 (×2): 1 mg via INTRAVENOUS

## 2020-12-15 MED ORDER — PHENYLEPHRINE HCL-NACL 10-0.9 MG/250ML-% IV SOLN
INTRAVENOUS | Status: DC | PRN
  Administered 2020-12-15: 60 ug/min via INTRAVENOUS

## 2020-12-15 MED ORDER — ACETAMINOPHEN 160 MG/5ML PO SOLN
ORAL | Status: AC
  Administered 2020-12-15: 650 mg via ORAL
  Filled 2020-12-15: qty 20.3

## 2020-12-15 MED ORDER — DEXMEDETOMIDINE HCL 200 MCG/2ML IV SOLN
INTRAVENOUS | Status: DC | PRN
  Administered 2020-12-15: 100 ug via INTRAVENOUS

## 2020-12-15 MED ORDER — PHENYLEPHRINE 40 MCG/ML (10ML) SYRINGE FOR IV PUSH (FOR BLOOD PRESSURE SUPPORT)
PREFILLED_SYRINGE | INTRAVENOUS | Status: DC | PRN
  Administered 2020-12-15: 80 ug via INTRAVENOUS
  Administered 2020-12-15: 120 ug via INTRAVENOUS
  Administered 2020-12-15 (×2): 80 ug via INTRAVENOUS

## 2020-12-15 MED ORDER — PROPOFOL 10 MG/ML IV BOLUS
INTRAVENOUS | Status: AC
  Filled 2020-12-15: qty 20

## 2020-12-15 MED ORDER — DEXMEDETOMIDINE (PRECEDEX) IN NS 20 MCG/5ML (4 MCG/ML) IV SYRINGE
PREFILLED_SYRINGE | INTRAVENOUS | Status: AC
  Filled 2020-12-15: qty 5

## 2020-12-15 MED ORDER — IOHEXOL 300 MG/ML  SOLN
50.0000 mL | Freq: Once | INTRAMUSCULAR | Status: AC | PRN
  Administered 2020-12-15: 15 mL

## 2020-12-15 MED ORDER — ONDANSETRON HCL 4 MG/2ML IJ SOLN: 4.0000 mg | Freq: Once | INTRAMUSCULAR | Status: DC | PRN

## 2020-12-15 MED ORDER — MIDAZOLAM HCL 2 MG/ML PO SYRP
12.0000 mg | ORAL_SOLUTION | Freq: Once | ORAL | Status: AC
  Administered 2020-12-15: 12 mg via ORAL

## 2020-12-15 MED ORDER — MIDAZOLAM HCL 2 MG/2ML IJ SOLN
INTRAMUSCULAR | Status: AC
  Filled 2020-12-15: qty 2

## 2020-12-15 MED ORDER — DEXMEDETOMIDINE (PRECEDEX) IN NS 20 MCG/5ML (4 MCG/ML) IV SYRINGE
PREFILLED_SYRINGE | INTRAVENOUS | Status: DC | PRN
  Administered 2020-12-15 (×2): 8 ug via INTRAVENOUS
  Administered 2020-12-15: 4 ug via INTRAVENOUS

## 2020-12-15 MED ORDER — PROPOFOL 10 MG/ML IV BOLUS
INTRAVENOUS | Status: DC | PRN
  Administered 2020-12-15 (×3): 20 mg via INTRAVENOUS

## 2020-12-15 MED ORDER — DEXMEDETOMIDINE 100 MCG/ML PEDIATRIC INJ FOR INTRANASAL USE
1.0000 ug/kg | Freq: Once | INTRAVENOUS | Status: DC
  Filled 2020-12-15: qty 1.2

## 2020-12-15 MED ORDER — CIPROFLOXACIN IN D5W 400 MG/200ML IV SOLN
INTRAVENOUS | Status: AC
  Filled 2020-12-15: qty 200

## 2020-12-15 MED ORDER — ROCURONIUM BROMIDE 10 MG/ML (PF) SYRINGE
PREFILLED_SYRINGE | INTRAVENOUS | Status: AC
  Filled 2020-12-15: qty 10

## 2020-12-15 MED ORDER — FENTANYL CITRATE (PF) 250 MCG/5ML IJ SOLN
INTRAMUSCULAR | Status: AC
  Filled 2020-12-15: qty 5

## 2020-12-15 MED ORDER — LIDOCAINE HCL 1 % IJ SOLN
INTRAMUSCULAR | Status: AC | PRN
  Administered 2020-12-15: 10 mL

## 2020-12-15 MED ORDER — SODIUM CHLORIDE 0.9 % IV SOLN: INTRAVENOUS | Status: DC

## 2020-12-15 NOTE — Anesthesia Postprocedure Evaluation (Signed)
Anesthesia Post Note  Patient: Samuel Shelton  Procedure(s) Performed: SUPERA PUBIC CATHETER PLACMENT in IR (N/A )     Patient location during evaluation: PACU Anesthesia Type: MAC Level of consciousness: awake Pain management: pain level controlled Vital Signs Assessment: post-procedure vital signs reviewed and stable Respiratory status: spontaneous breathing Cardiovascular status: stable Postop Assessment: no apparent nausea or vomiting Anesthetic complications: no   No complications documented.  Last Vitals:  Vitals:   12/15/20 1516 12/15/20 1531  BP: 127/87 107/74  Pulse: 80 79  Resp: 17 17  Temp:    SpO2: 99% 97%    Last Pain:  Vitals:   12/15/20 1221  TempSrc: Axillary                 Quin Mcpherson

## 2020-12-15 NOTE — OR Nursing (Signed)
Pt placed in wheelchair- personal wheelchair via of hoyer lift and 4 nurses.  longterm ride transport present. Pt in NAD upon leaving pacu

## 2020-12-15 NOTE — OR Nursing (Addendum)
Pt is awake,alert. Pt is in NAD at this time. Pt and family verbalized understanding of poc and discharge instructions. instructions given to family and reviewed prior to discharge. [facility] long term facility report given to [facility]

## 2020-12-15 NOTE — H&P (Signed)
Chief Complaint: Patient was seen in consultation today for supra pubic catheter placement at the request of [place] A  Referring Physician(s): [place] A  Supervising Physician: Dr Ruthann Cancer  Patient Status: [facility] - Out-pt  History of Present Illness: Samuel Shelton is a 69 y.o. male   Hx prostate ca Mental retardation Lives in SNF Urinary retention  SP catheter was placed in IR 11/09/20 Pt pulled out 2 weeks later  Was scheduled for replacement 12/08/20 But pt was unable to tolerate procedure  Attestation signed by Markus Daft, MD at 12/08/2020 12:53 PM   Patient was unable to lie on his back without having his legs contracted and pulled up.  Very difficult to even exam the suprapubic area.  Sedated the patient with versed and fentanyl but patient remained rigid and contracted.  He was cooperative after sedation but still unable to lie on his back.  Discussed with Zara Council in Urology.  Suprapubic catheter will need to be performed with general anesthesia.  Urology will discuss and let IR know if we are still needed.  If Urology cannot place catheter in OR, we can try in IR suite with general anesthesia. Based on my discussion with Anesthesia today, CT-guided tube placement may be difficult to perform at [facility] with general anesthesia.  CT guided tube placement with anesthesia could be performed at [facility] or [facility] if needed.    Now rescheduled for supra pubic catheter replacement/placement in IR with anesthesia    Past Medical History:  Diagnosis Date  . Chronic mental illness   . Edema of both legs   . Elevated PSA   . Elevated PSA   . Hypertension   . Mental retardation   . Phimosis     Past Surgical History:  Procedure Laterality Date  . IR REPLC GASTRO/COLONIC TUBE PERCUT W/FLUORO  08/20/2020  . PEG PLACEMENT N/A 01/24/2020   Procedure: PERCUTANEOUS ENDOSCOPIC GASTROSTOMY (PEG) PLACEMENT;  Surgeon: [name], MD;   Location: [facility] ENDOSCOPY;  Service: Endoscopy;  Laterality: N/A;  . PROSTATE BIOPSY N/A 06/22/2015   Procedure: PROSTATE BIOPSY;  Surgeon: Hollice Espy, MD;  Location: [facility] ORS;  Service: Urology;  Laterality: N/A;  . PROSTATE BIOPSY N/A 09/27/2016   Procedure: PROSTATE BIOPSY;  Surgeon: Hollice Espy, MD;  Location: [facility] ORS;  Service: Urology;  Laterality: N/A;    Allergies: Patient has no known allergies.  Medications: Prior to Admission medications   Medication Sig Start Date End Date Taking? Authorizing Provider  acetaminophen (TYLENOL) 500 MG tablet Take 500 mg by mouth every 4 (four) hours as needed for mild pain.    [provider]  aspirin 325 MG tablet Take 1 tablet (325 mg total) by mouth daily. 08/08/20   Dahal, Marlowe Aschoff, MD  calcium-vitamin D (OSCAL WITH D) 500-200 MG-UNIT tablet Take 1 tablet by mouth.    [provider]  doxazosin (CARDURA) 1 MG tablet Take 1 mg by mouth daily.    [provider]  ferrous sulfate 325 (65 FE) MG tablet Take 325 mg by mouth daily with breakfast.    [provider]  finasteride (PROSCAR) 5 MG tablet Take 1 tablet (5 mg total) by mouth daily. 09/05/18   Hollice Espy, MD  pantoprazole (PROTONIX) 40 MG tablet Take 40 mg by mouth daily.    [provider]  Water For Irrigation, Sterile (FREE WATER) SOLN Place 120 mLs into feeding tube every 4 (four) hours. 01/28/20   Fritzi Mandes, MD  Family History  Problem Relation Age of Onset  . Stroke Mother   . Hypertension Sister   . Diabetes type II Sister     Social History   Socioeconomic History  . Marital status: Single    Spouse name: Not on file  . Number of children: Not on file  . Years of education: Not on file  . Highest education level: Not on file  Occupational History  . Not on file  Tobacco Use  . Smoking status: Former Smoker    Packs/day: 0.50    Types: Cigarettes  . Smokeless tobacco: Never Used  Vaping Use  . Vaping Use: Never  used  Substance and Sexual Activity  . Alcohol use: No    Alcohol/week: 0.0 standard drinks  . Drug use: No  . Sexual activity: Not on file  Other Topics Concern  . Not on file  Social History Narrative  . Not on file   Social Determinants of Health   Financial Resource Strain: Not on file  Food Insecurity: Not on file  Transportation Needs: Not on file  Physical Activity: Not on file  Stress: Not on file  Social Connections: Not on file     Review of Systems: A 12 point ROS discussed and pertinent positives are indicated in the HPI above.  All other systems are negative.  Review of Systems  Vital Signs: There were no vitals taken for this visit.  Physical Exam Vitals reviewed.  HENT:     Mouth/Throat:     Mouth: Mucous membranes are moist.  Cardiovascular:     Rate and Rhythm: Normal rate and regular rhythm.     Heart sounds: Normal heart sounds.  Pulmonary:     Effort: Pulmonary effort is normal.     Breath sounds: Normal breath sounds.  Abdominal:     Palpations: Abdomen is soft.  Skin:    General: Skin is warm.  Neurological:     Mental Status: Mental status is at baseline.  Psychiatric:     Comments: pts POA; Niece Samuel Shelton is at bedside She consents for procedure     Imaging: No results found.  Labs:  CBC: Recent Labs    01/25/20 0449 01/25/20 2234 08/03/20 1641 08/03/20 1648 08/03/20 2108 08/06/20 1100  WBC 7.2 5.9 9.1  --   --  7.1  HGB 10.7* 9.9* 10.7* 11.9* 12.6* 10.1*  HCT 34.2* 30.8* 36.1* 35.0* 37.0* 33.9*  PLT 216 223 335  --   --  384    COAGS: Recent Labs    08/03/20 1641  INR 1.3*  APTT 36    BMP: Recent Labs    01/28/20 0503 01/29/20 0642 08/03/20 1641 08/03/20 1648 08/03/20 2108 08/06/20 1100 11/03/20 1319  NA 140 142 144 146* 146* 147*  --   K 3.9 4.1 3.8 3.6 4.6 4.5  --   CL 108 108 108 110  --  113*  --   CO2 24 24 24   --   --  23  --   GLUCOSE 104* 108* 102* 97  --  103*  --   BUN 33* 33* 34* 33*  --   15  --   CALCIUM 8.3* 8.7* 9.1  --   --  9.1  --   CREATININE 1.22 1.43* 1.51* 1.40*  --  1.03 0.88  GFRNONAA 60* 49* 46*  --   --  >60 86  GFRAA >60 57* 53*  --   --  >60 100  LIVER FUNCTION TESTS: Recent Labs    01/10/20 2111 01/11/20 0716 01/15/20 0550 01/16/20 0326 01/22/20 0704 08/03/20 1641  BILITOT 1.3* 1.5* 0.9  --   --  0.8  AST 39 43* 45*  --   --  62*  ALT 27 24 31   --   --  40  ALKPHOS 76 72 53  --   --  50  PROT 9.2* 8.8* 6.5  --   --  7.3  ALBUMIN 3.7 3.3* 2.3* 2.3* 2.6* 2.9*    TUMOR MARKERS: No results for input(s): AFPTM, CEA, CA199, CHROMGRNA in the last 8760 hours.  Assessment and Plan:  Mental retardation Living in SNF Urinary retention Supra pubic catheter was placed in IR 11/09/20 Pulled out 12/8 Unable to tolerate replacement only using sedation - 12/08/20 Now rescheduled for replacement in IR with Anesthesia pts niece is aware of procedure benefits and risks including but not limited to Infection; bleeding; damage to surrounding structures. Agreeable to proceed Consent signed and in chart  Thank you for this interesting consult.  I greatly enjoyed meeting Samuel Shelton and look forward to participating in their care.  A copy of this report was sent to the requesting provider on this date.  Electronically Signed: Lavonia Drafts, PA-C 12/15/2020, 10:57 AM   I spent a total of    25 Minutes in face to face in clinical consultation, greater than 50% of which was counseling/coordinating care for supra pubic catheter replacement/placement

## 2020-12-15 NOTE — Procedures (Signed)
Interventional Radiology Procedure Note  Procedure: Suprapubic catheter insertion  Findings: Please refer to procedural dictation for full description.  Ultrasound and fluoroscopic guided placement of 14 Fr Foley catheter into urinary bladder via suprapubic route.    Complications: None immediate  Estimated Blood Loss: < 5 mL  Recommendations: Keep to bag drainage. Indwelling Foley has end hole created, to facilitate future upsize to 16 or 18 Fr, as clinically indicated.   Ruthann Cancer, MD Pager: [phone]

## 2020-12-15 NOTE — H&P (Deleted)
  The note originally documented on this encounter has been moved the the encounter in which it belongs.  

## 2020-12-15 NOTE — Anesthesia Preprocedure Evaluation (Signed)
Anesthesia Evaluation  Patient identified by MRN, date of birth, ID band Patient confused    Reviewed: Allergy & Precautions, H&P , NPO status , Patient's Chart, lab work & pertinent test results  Airway Mallampati: II  TM Distance: >3 FB Neck ROM: Full    Dental no notable dental hx. (+) Teeth Intact, Dental Advisory Given   Pulmonary neg pulmonary ROS, former smoker,    Pulmonary exam normal breath sounds clear to auscultation       Cardiovascular hypertension, negative cardio ROS   Rhythm:Regular Rate:Normal     Neuro/Psych Mental retardation  negative psych ROS   GI/Hepatic Neg liver ROS, PUD,   Endo/Other  negative endocrine ROS  Renal/GU negative Renal ROS  negative genitourinary   Musculoskeletal   Abdominal   Peds  Hematology negative hematology ROS (+)   Anesthesia Other Findings   Reproductive/Obstetrics negative OB ROS                             Anesthesia Physical Anesthesia Plan  ASA: III  Anesthesia Plan: General   Post-op Pain Management:    Induction: Intravenous  PONV Risk Score and Plan: 3 and Ondansetron and Midazolam  Airway Management Planned: Oral ETT  Additional Equipment:   Intra-op Plan:   Post-operative Plan: Extubation in OR  Informed Consent: I have reviewed the patients History and Physical, chart, labs and discussed the procedure including the risks, benefits and alternatives for the proposed anesthesia with the patient or authorized representative who has indicated his/her understanding and acceptance.     Dental advisory given  Plan Discussed with: CRNA  Anesthesia Plan Comments:         Anesthesia Quick Evaluation

## 2020-12-15 NOTE — Anesthesia Procedure Notes (Signed)
Procedure Name: MAC Date/Time: 12/15/2020 1:25 PM Performed by: Janace Litten, CRNA Pre-anesthesia Checklist: Patient identified, Emergency Drugs available, Suction available and Patient being monitored Patient Re-evaluated:Patient Re-evaluated prior to induction Oxygen Delivery Method: Simple face mask

## 2020-12-15 NOTE — Transfer of Care (Signed)
Immediate Anesthesia Transfer of Care Note  Patient: Samuel Shelton  Procedure(s) Performed: SUPERA PUBIC CATHETER PLACMENT in IR (N/A )  Patient Location: PACU  Anesthesia Type:MAC  Level of Consciousness: drowsy and patient cooperative  Airway & Oxygen Therapy: Patient Spontanous Breathing and Patient connected to face mask oxygen  Post-op Assessment: Report given to RN and Post -op Vital signs reviewed and stable  Post vital signs: Reviewed and stable  Last Vitals:  Vitals Value Taken Time  BP 77/48 12/15/20 1457  Temp    Pulse 85 12/15/20 1500  Resp 22 12/15/20 1500  SpO2 100 % 12/15/20 1500  Vitals shown include unvalidated device data.  Last Pain:  Vitals:   12/15/20 1221  TempSrc: Axillary         Complications: No complications documented.

## 2020-12-16 ENCOUNTER — Encounter (HOSPITAL_COMMUNITY): Payer: Self-pay | Admitting: Interventional Radiology

## 2020-12-28 ENCOUNTER — Encounter (HOSPITAL_COMMUNITY): Payer: Self-pay | Admitting: Interventional Radiology

## 2021-01-01 ENCOUNTER — Other Ambulatory Visit: Payer: Self-pay | Admitting: Radiology

## 2021-01-01 DIAGNOSIS — R339 Retention of urine, unspecified: Secondary | ICD-10-CM

## 2021-01-12 ENCOUNTER — Encounter (HOSPITAL_COMMUNITY): Payer: Self-pay | Admitting: *Deleted

## 2021-01-12 NOTE — Progress Notes (Signed)
Pt is a resident at [facility]. I spoke with [facility], LPN and she states pt is not able to speak for himself. His niece, [name] is his legal guardian and she will meet him here Friday at 9:15 AM. Pt will arrive via [facility]i from [place]. Earnest Bailey states pt is combative whenever anything has to be done to or for pt. He takes his medicines with food or milkshakes, "has to be bribed to take them" per [place]. Instructed Holly not to give any medications the morning of surgery.  Covid test to be done on arrival Friday AM.   Pre-op instructions faxed to [place] at [phone].

## 2021-01-12 NOTE — Pre-Procedure Instructions (Signed)
    Kayton Dunaj  01/12/2021     Mr. Archambeault's procedure is scheduled on Friday, 01/15/21 at 12:15 PM.   Report to [facility] Entrance "A" Admitting Office at 9:15 AM.   Call this number if you have problems the morning of surgery: [phone]   Remember:            Patient is not to eat or drink after midnight Thursday, 01/14/21.      Take these medicines the morning of surgery with A SIP OF WATER: NONE    Do not wear jewelry.  Do not wear lotions, powders, cologne or deodorant.  Men may shave face and neck.  Do not bring valuables to the hospital.  [facility] is not responsible for any belongings or valuables.  Contacts, dentures or bridgework may not be worn into surgery.  Leave your suitcase in the car.  After surgery it may be brought to your room.  For patients admitted to the hospital, discharge time will be determined by your treatment team.  Patients discharged the day of surgery will not be allowed to drive home.

## 2021-01-14 ENCOUNTER — Other Ambulatory Visit: Payer: Self-pay | Admitting: Radiology

## 2021-01-15 ENCOUNTER — Ambulatory Visit (HOSPITAL_COMMUNITY)
Admission: RE | Admit: 2021-01-15 | Discharge: 2021-01-15 | Disposition: A | Discharge status: Other Acute Inpatient Hospital | Payer: Medicare Other | Attending: Interventional Radiology | Admitting: Interventional Radiology

## 2021-01-15 ENCOUNTER — Ambulatory Visit (HOSPITAL_COMMUNITY)
Admission: RE | Admit: 2021-01-15 | Discharge: 2021-01-15 | Disposition: A | Discharge status: Home / Self Care | Payer: Medicare Other | Source: Ambulatory Visit | Attending: Urology | Admitting: Urology

## 2021-01-15 ENCOUNTER — Ambulatory Visit: Payer: Medicare Other

## 2021-01-15 ENCOUNTER — Encounter (HOSPITAL_COMMUNITY)
Admission: RE | Disposition: A | Discharge status: Nursing Home (Includes ICF) | Payer: Self-pay | Source: Home / Self Care

## 2021-01-15 ENCOUNTER — Other Ambulatory Visit: Payer: Self-pay

## 2021-01-15 ENCOUNTER — Ambulatory Visit (HOSPITAL_COMMUNITY): Payer: Medicare Other | Admitting: Anesthesiology

## 2021-01-15 ENCOUNTER — Other Ambulatory Visit (HOSPITAL_COMMUNITY): Payer: Self-pay | Admitting: Radiology

## 2021-01-15 ENCOUNTER — Encounter (HOSPITAL_COMMUNITY): Payer: Self-pay

## 2021-01-15 DIAGNOSIS — R339 Retention of urine, unspecified: Secondary | ICD-10-CM | POA: Insufficient documentation

## 2021-01-15 DIAGNOSIS — Z436 Encounter for attention to other artificial openings of urinary tract: Secondary | ICD-10-CM | POA: Insufficient documentation

## 2021-01-15 DIAGNOSIS — Z7982 Long term (current) use of aspirin: Secondary | ICD-10-CM | POA: Diagnosis not present

## 2021-01-15 DIAGNOSIS — Z87891 Personal history of nicotine dependence: Secondary | ICD-10-CM | POA: Diagnosis not present

## 2021-01-15 DIAGNOSIS — Z79899 Other long term (current) drug therapy: Secondary | ICD-10-CM | POA: Insufficient documentation

## 2021-01-15 DIAGNOSIS — R319 Hematuria, unspecified: Secondary | ICD-10-CM | POA: Diagnosis not present

## 2021-01-15 DIAGNOSIS — Z20822 Contact with and (suspected) exposure to covid-19: Secondary | ICD-10-CM | POA: Insufficient documentation

## 2021-01-15 HISTORY — DX: Gastro-esophageal reflux disease without esophagitis: K21.9

## 2021-01-15 HISTORY — PX: RADIOLOGY WITH ANESTHESIA: SHX6223

## 2021-01-15 HISTORY — PX: IR CATHETER TUBE CHANGE: IMG717

## 2021-01-15 LAB — BASIC METABOLIC PANEL
Anion gap: 13 (ref 5–15)
BUN: 27 mg/dL — ABNORMAL HIGH (ref 8–23)
CO2: 24 mmol/L (ref 22–32)
Calcium: 9.3 mg/dL (ref 8.9–10.3)
Chloride: 113 mmol/L — ABNORMAL HIGH (ref 98–111)
Creatinine, Ser: 0.93 mg/dL (ref 0.61–1.24)
GFR, Estimated: >60 mL/min (ref >60)
Glucose, Bld: 93 mg/dL (ref 70–99)
Potassium: 3.6 mmol/L (ref 3.5–5.1)
Sodium: 150 mmol/L — ABNORMAL HIGH (ref 135–145)

## 2021-01-15 LAB — CBC
HCT: 39.4 % (ref 39.0–52.0)
Hemoglobin: 11.7 g/dL — ABNORMAL LOW (ref 13.0–17.0)
MCH: 26.4 pg (ref 26.0–34.0)
MCHC: 29.7 g/dL — ABNORMAL LOW (ref 30.0–36.0)
MCV: 88.7 fL (ref 80.0–100.0)
Platelets: 336 10*3/uL (ref 150–400)
RBC: 4.44 MIL/uL (ref 4.22–5.81)
RDW: 15.9 % — ABNORMAL HIGH (ref 11.5–15.5)
WBC: 5.4 10*3/uL (ref 4.0–10.5)
nRBC: 0 % (ref 0.0–0.2)

## 2021-01-15 LAB — SARS CORONAVIRUS 2 BY RT PCR (HOSPITAL ORDER, PERFORMED IN ~~LOC~~ HOSPITAL LAB): SARS Coronavirus 2: NEGATIVE

## 2021-01-15 SURGERY — IR WITH ANESTHESIA
Anesthesia: Monitor Anesthesia Care

## 2021-01-15 MED ORDER — KETOROLAC TROMETHAMINE 30 MG/ML IJ SOLN
30.0000 mg | Freq: Once | INTRAMUSCULAR | Status: AC
  Administered 2021-01-15: 30 mg via INTRAVENOUS

## 2021-01-15 MED ORDER — LIDOCAINE HCL 1 % IJ SOLN
INTRAMUSCULAR | Status: AC
  Filled 2021-01-15: qty 20

## 2021-01-15 MED ORDER — PROPOFOL 500 MG/50ML IV EMUL
INTRAVENOUS | Status: DC | PRN
  Administered 2021-01-15: 150 ug/kg/min via INTRAVENOUS

## 2021-01-15 MED ORDER — KETAMINE HCL 100 MG/ML IJ SOLN
INTRAMUSCULAR | Status: AC
  Filled 2021-01-15: qty 1

## 2021-01-15 MED ORDER — KETAMINE HCL 50 MG/5ML IJ SOSY
PREFILLED_SYRINGE | INTRAMUSCULAR | Status: AC
  Filled 2021-01-15: qty 10

## 2021-01-15 MED ORDER — KETAMINE HCL 50 MG/5ML IJ SOSY
PREFILLED_SYRINGE | INTRAMUSCULAR | Status: AC
  Filled 2021-01-15: qty 15

## 2021-01-15 MED ORDER — CIPROFLOXACIN IN D5W 400 MG/200ML IV SOLN
400.0000 mg | INTRAVENOUS | Status: AC
  Administered 2021-01-15: 400 mg via INTRAVENOUS
  Filled 2021-01-15: qty 200

## 2021-01-15 MED ORDER — ONDANSETRON HCL 4 MG/2ML IJ SOLN: 4.0000 mg | Freq: Once | INTRAMUSCULAR | Status: DC | PRN

## 2021-01-15 MED ORDER — SODIUM CHLORIDE 0.9% FLUSH: 5.0000 mL | Freq: Three times a day (TID) | INTRAVENOUS | Status: DC

## 2021-01-15 MED ORDER — LACTATED RINGERS IV SOLN: INTRAVENOUS | Status: DC

## 2021-01-15 MED ORDER — FENTANYL CITRATE (PF) 100 MCG/2ML IJ SOLN
INTRAMUSCULAR | Status: DC | PRN
  Administered 2021-01-15 (×4): 25 ug via INTRAVENOUS

## 2021-01-15 MED ORDER — LIDOCAINE VISCOUS HCL 2 % MT SOLN
OROMUCOSAL | Status: AC
  Filled 2021-01-15: qty 15

## 2021-01-15 MED ORDER — MIDAZOLAM HCL 5 MG/5ML IJ SOLN
INTRAMUSCULAR | Status: DC | PRN
  Administered 2021-01-15: 2 mg via INTRAVENOUS

## 2021-01-15 MED ORDER — PHENYLEPHRINE HCL-NACL 10-0.9 MG/250ML-% IV SOLN
INTRAVENOUS | Status: DC | PRN
Start: 2021-01-15 — End: 2021-01-15
  Administered 2021-01-15: 40 ug/min via INTRAVENOUS

## 2021-01-15 MED ORDER — HYDROMORPHONE HCL 1 MG/ML IJ SOLN
0.2500 mg | INTRAMUSCULAR | Status: DC | PRN
  Administered 2021-01-15: 0.5 mg via INTRAVENOUS

## 2021-01-15 MED ORDER — HYDROMORPHONE HCL 1 MG/ML IJ SOLN
INTRAMUSCULAR | Status: AC
  Administered 2021-01-15: 0.5 mg via INTRAVENOUS
  Filled 2021-01-15: qty 1

## 2021-01-15 MED ORDER — ACETAMINOPHEN 10 MG/ML IV SOLN: 1000.0000 mg | Freq: Once | INTRAVENOUS | Status: DC | PRN

## 2021-01-15 MED ORDER — CIPROFLOXACIN HCL 500 MG PO TABS: 500.0000 mg | ORAL_TABLET | Freq: Two times a day (BID) | ORAL | 0 refills | Status: AC

## 2021-01-15 MED ORDER — IOHEXOL 300 MG/ML  SOLN
50.0000 mL | Freq: Once | INTRAMUSCULAR | Status: AC | PRN
  Administered 2021-01-15: 45 mL

## 2021-01-15 MED ORDER — ONDANSETRON HCL 4 MG/2ML IJ SOLN
INTRAMUSCULAR | Status: DC | PRN
  Administered 2021-01-15: 4 mg via INTRAVENOUS

## 2021-01-15 MED ORDER — KETOROLAC TROMETHAMINE 15 MG/ML IJ SOLN: 15.0000 mg | Freq: Once | INTRAMUSCULAR | Status: DC

## 2021-01-15 MED ORDER — KETOROLAC TROMETHAMINE 30 MG/ML IJ SOLN
INTRAMUSCULAR | Status: AC
  Filled 2021-01-15: qty 1

## 2021-01-15 NOTE — Anesthesia Preprocedure Evaluation (Addendum)
Anesthesia Evaluation  Patient identified by MRN, date of birth, ID band Patient confused    Reviewed: Allergy & Precautions, NPO status , Patient's Chart, lab work & pertinent test results  Airway Mallampati: II  TM Distance: >3 FB Neck ROM: Full    Dental  (+) Edentulous Upper, Edentulous Lower   Pulmonary neg pulmonary ROS, former smoker,    Pulmonary exam normal        Cardiovascular hypertension,  Rhythm:Regular Rate:Normal     Neuro/Psych Mental retardation CVA negative psych ROS   GI/Hepatic Neg liver ROS, GERD  Medicated,  Endo/Other  negative endocrine ROS  Renal/GU Urinary retention      Musculoskeletal negative musculoskeletal ROS (+)   Abdominal   Peds  Hematology negative hematology ROS (+)   Anesthesia Other Findings   Reproductive/Obstetrics                           Anesthesia Physical Anesthesia Plan  ASA: III  Anesthesia Plan: MAC   Post-op Pain Management:    Induction: Intravenous  PONV Risk Score and Plan: 1 and Ondansetron, Dexamethasone, Treatment may vary due to age or medical condition and Propofol infusion  Airway Management Planned: Simple Face Mask, Natural Airway and Nasal Cannula  Additional Equipment: None  Intra-op Plan:   Post-operative Plan:   Informed Consent: I have reviewed the patients History and Physical, chart, labs and discussed the procedure including the risks, benefits and alternatives for the proposed anesthesia with the patient or authorized representative who has indicated his/her understanding and acceptance.       Plan Discussed with: CRNA  Anesthesia Plan Comments: (Will pre-medicate in short stay for IV placement.  Lab Results      Component                Value               Date                      WBC                      4.8                 12/15/2020                HGB                      11.3 (L)             12/15/2020                HCT                      39.0                12/15/2020                MCV                      89.0                12/15/2020                PLT                      387  12/15/2020           Lab Results      Component                Value               Date                      NA                       144                 12/15/2020                K                        3.7                 12/15/2020                CO2                      22                  12/15/2020                GLUCOSE                  99                  12/15/2020                BUN                      21                  12/15/2020                CREATININE               1.11                12/15/2020                CALCIUM                  9.3                 12/15/2020                GFRNONAA                 >60                 12/15/2020                GFRAA                    100                 11/03/2020           )      Anesthesia Quick Evaluation

## 2021-01-15 NOTE — Transfer of Care (Signed)
Immediate Anesthesia Transfer of Care Note  Patient: Samuel Shelton  Procedure(s) Performed: IR WITH ANESTHESIA CATERTER TUB [place] (N/A )  Patient Location: PACU  Anesthesia Type:MAC  Level of Consciousness: awake  Airway & Oxygen Therapy: Patient Spontanous Breathing  Post-op Assessment: Report given to RN, Post -op Vital signs reviewed and stable and Patient moving all extremities  Post vital signs: Reviewed and stable  Last Vitals:  Vitals Value Taken Time  BP 181/118 01/15/21 1406  Temp    Pulse 91 01/15/21 1407  Resp 22 01/15/21 1407  SpO2 98 % 01/15/21 1407  Vitals shown include unvalidated device data.  Last Pain:  Vitals:   01/15/21 1136  TempSrc: Axillary         Complications: No complications documented.

## 2021-01-15 NOTE — Anesthesia Procedure Notes (Signed)
Procedure Name: MAC Date/Time: 01/15/2021 12:28 PM Performed by: Kyung Rudd, CRNA Pre-anesthesia Checklist: Patient identified, Emergency Drugs available, Suction available and Patient being monitored Patient Re-evaluated:Patient Re-evaluated prior to induction Oxygen Delivery Method: Simple face mask Preoxygenation: Pre-oxygenation with 100% oxygen Induction Type: IV induction Placement Confirmation: positive ETCO2 Dental Injury: Teeth and Oropharynx as per pre-operative assessment

## 2021-01-15 NOTE — H&P (Deleted)
  The note originally documented on this encounter has been moved the the encounter in which it belongs.  

## 2021-01-15 NOTE — H&P (Signed)
Chief Complaint: Patient was seen in consultation today for urinary retention  Referring Physician(s): [place] A  Supervising Physician: Corrie Mckusick  Patient Status: [facility]  History of Present Illness: Samuel Shelton is a 70 y.o. male with past medical history of prostate cancer and incomplete bladder emptying.  Patient lives in a residential/skilled nursing facility and has a legal guardian, his niece [name], who assists with medical needs.  He underwent SPT placement 12/15/20 with anesthesia due profound mental incapacitation.  Mr. Labella presents to [facility] today with his niece.  He appears alert and comfortable when left alone.  Becomes easily restless and agitated with any manipulation.  Per niece, he is in usual state of health. She does question whether he has an UTI as his urine is dark in color. She is agreeable to procedure today.   Past Medical History:  Diagnosis Date  . Chronic mental illness   . Edema of both legs   . Elevated PSA   . Elevated PSA   . GERD (gastroesophageal reflux disease)   . Hypertension   . Mental retardation   . Phimosis     Past Surgical History:  Procedure Laterality Date  . IR GUIDED DRAIN W CATHETER PLACEMENT  12/15/2020  . IR REPLC GASTRO/COLONIC TUBE PERCUT W/FLUORO  08/20/2020  . PEG PLACEMENT N/A 01/24/2020   Procedure: PERCUTANEOUS ENDOSCOPIC GASTROSTOMY (PEG) PLACEMENT;  Surgeon: [name], MD;  Location: [facility] ENDOSCOPY;  Service: Endoscopy;  Laterality: N/A;  . PROSTATE BIOPSY N/A 06/22/2015   Procedure: PROSTATE BIOPSY;  Surgeon: Hollice Espy, MD;  Location: [facility] ORS;  Service: Urology;  Laterality: N/A;  . PROSTATE BIOPSY N/A 09/27/2016   Procedure: PROSTATE BIOPSY;  Surgeon: Hollice Espy, MD;  Location: [facility] ORS;  Service: Urology;  Laterality: N/A;  . RADIOLOGY WITH ANESTHESIA N/A 12/15/2020   Procedure: SUPERA PUBIC CATHETER PLACMENT in IR;  Surgeon: Suzette Battiest, MD;  Location: [facility];   Service: Radiology;  Laterality: N/A;    Allergies: Patient has no known allergies.  Medications: Prior to Admission medications   Medication Sig Start Date End Date Taking? Authorizing Provider  acetaminophen (TYLENOL) 500 MG tablet Take 500 mg by mouth every 4 (four) hours as needed for mild pain.   Yes [provider]  aspirin 325 MG tablet Take 1 tablet (325 mg total) by mouth daily. 08/08/20  Yes Dahal, Marlowe Aschoff, MD  calcium-vitamin D (OSCAL WITH D) 500-200 MG-UNIT tablet Take 1 tablet by mouth.   Yes [provider]  doxazosin (CARDURA) 1 MG tablet Take 1 mg by mouth daily.   Yes [provider]  ferrous sulfate 325 (65 FE) MG tablet Take 325 mg by mouth daily with breakfast.   Yes [provider]  finasteride (PROSCAR) 5 MG tablet Take 1 tablet (5 mg total) by mouth daily. 09/05/18  Yes Hollice Espy, MD  pantoprazole (PROTONIX) 40 MG tablet Take 40 mg by mouth daily.   Yes [provider]  Water For Irrigation, Sterile (FREE WATER) SOLN Place 120 mLs into feeding tube every 4 (four) hours. Patient not taking: Reported on 11/09/2020 01/28/20   Fritzi Mandes, MD     Family History  Problem Relation Age of Onset  . Stroke Mother   . Hypertension Sister   . Diabetes type II Sister     Social History   Socioeconomic History  . Marital status: Single    Spouse name: Not on file  . Number of children: Not on file  .  Years of education: Not on file  . Highest education level: Not on file  Occupational History  . Not on file  Tobacco Use  . Smoking status: Former Smoker    Packs/day: 0.50    Types: Cigarettes  . Smokeless tobacco: Never Used  Vaping Use  . Vaping Use: Never used  Substance and Sexual Activity  . Alcohol use: No    Alcohol/week: 0.0 standard drinks  . Drug use: No  . Sexual activity: Not on file  Other Topics Concern  . Not on file  Social History Narrative  . Not on file   Social Determinants of Health    Financial Resource Strain: Not on file  Food Insecurity: Not on file  Transportation Needs: Not on file  Physical Activity: Not on file  Stress: Not on file  Social Connections: Not on file     Review of Systems: A 12 point ROS discussed and pertinent positives are indicated in the HPI above.  All other systems are negative.  Review of Systems  Unable to perform ROS: Psychiatric disorder    Vital Signs: There were no vitals taken for this visit.  Physical Exam Vitals and nursing note reviewed.  Constitutional:      General: He is not in acute distress.    Appearance: Normal appearance. He is not ill-appearing.  Cardiovascular:     Rate and Rhythm: Regular rhythm. Tachycardia present.  Pulmonary:     Effort: Pulmonary effort is normal.     Breath sounds: Normal breath sounds.  Abdominal:     General: Abdomen is flat.     Palpations: Abdomen is soft.  Genitourinary:    Comments: SPT tube in place. Dark yellow urine in bag.  Skin:    General: Skin is warm and dry.  Neurological:     General: No focal deficit present.     Mental Status: He is alert and oriented to person, place, and time. Mental status is at baseline.  Psychiatric:        Mood and Affect: Mood normal.        Behavior: Behavior normal.        Thought Content: Thought content normal.        Judgment: Judgment normal.      MD Evaluation Airway: WNL Heart: WNL Abdomen: WNL Chest/ Lungs: WNL ASA  Classification: 3 Mallampati/Airway Score: Two   Imaging: No results found.  Labs:  CBC: Recent Labs    01/25/20 2234 08/03/20 1641 08/03/20 1648 08/03/20 2108 08/06/20 1100 12/15/20 0922  WBC 5.9 9.1  --   --  7.1 4.8  HGB 9.9* 10.7* 11.9* 12.6* 10.1* 11.3*  HCT 30.8* 36.1* 35.0* 37.0* 33.9* 39.0  PLT 223 335  --   --  384 387    COAGS: Recent Labs    08/03/20 1641 12/15/20 0922  INR 1.3* 1.1  APTT 36  --     BMP: Recent Labs    01/29/20 0642 08/03/20 1641 08/03/20 1648  08/03/20 2108 08/06/20 1100 11/03/20 1319 12/15/20 1229  NA 142 144 146* 146* 147*  --  144  K 4.1 3.8 3.6 4.6 4.5  --  3.7  CL 108 108 110  --  113*  --  111  CO2 24 24  --   --  23  --  22  GLUCOSE 108* 102* 97  --  103*  --  99  BUN 33* 34* 33*  --  15  --  21  CALCIUM  8.7* 9.1  --   --  9.1  --  9.3  CREATININE 1.43* 1.51* 1.40*  --  1.03 0.88 1.11  GFRNONAA 49* 46*  --   --  >60 86 >60  GFRAA 57* 53*  --   --  >60 100  --     LIVER FUNCTION TESTS: Recent Labs    01/22/20 0704 08/03/20 1641  BILITOT  --  0.8  AST  --  62*  ALT  --  40  ALKPHOS  --  50  PROT  --  7.3  ALBUMIN 2.6* 2.9*    TUMOR MARKERS: No results for input(s): AFPTM, CEA, CA199, CHROMGRNA in the last 8760 hours.  Assessment and Plan: Patient with past medical history of stroke, mental illness/cognitive delay presents with complaint of urinary retention.  Presents to IR today for routine exchange. Case reviewed by Dr. Earleen Newport who approves patient for procedure with anesthesia today.  Patient presents today in their usual state of health. He does have dark urine in his foley.  He had a prior gastrostomy tube but due to restlessness and agitation inadvertently pulled it and replacement was not felt to be in his best interest due to possible self harm.  He likely does have a degree of dehydration.  His urine is colonized and therefore urine culture not likely to provide beneficial information. Dr. Earleen Newport recommended to niece that patient be closely followed for signs of infection/UTI at nursing home provided [facility] shows no indication of infection today He has been NPO and is not currently on blood thinners.   Risks and benefits discussed with the patient including bleeding, infection, damage to adjacent structures, and sepsis.  All of the patient's questions were answered, patient is agreeable to proceed. Consent signed and in chart.  Thank you for this interesting consult.  I greatly enjoyed meeting  Takuya Lariccia and look forward to participating in their care.  A copy of this report was sent to the requesting provider on this date.  Electronically Signed: Docia Barrier, PA 01/15/2021, 10:37 AM   I spent a total of  30 Minutes   in face to face in clinical consultation, greater than 50% of which was counseling/coordinating care for urinary retention.

## 2021-01-15 NOTE — Procedures (Addendum)
Interventional Radiology Procedure Note   HPI:  Suprapubic catheter.  His wife says he is spontaneous urinating, indicating blockage of the catheter. Presents for exchange.  Last exchange was 12/15/20.   Procedure:    Attempted exchange/upsize of suprapubic drain/foley.  Previous 13F was totally occluded and scarred in, with debris filling the lumen.    Kumpe catheter was navigated adjacent to the foley to enter the distended bladder.    Serial dilation was attempted of the sclerosed tract, with 5F, 50F, 72F, 13F and attempted 55F.  Unable to successfully dilate beyond 13F.    A 13F biliary was placed to gravity.  This drain was selected as there was at least significant irritation of the tract vs bladder wall injury causing some hematuria.  No clots.    The entire bladder was emptied, then 13F drain to gravity.   Complications: Hematuria  Recommendations:  - To gravity - Plan for DC today when goals met after PACU - Plan for exchange for new drain in 2-3 weeks, with general anesthesia.  Will discuss with Urology - Do not submerge - Hematuria is expected.  As long as draining urine, expect resolution without intervention. If large clots and no drainage, contact VIR.     Signed,  Dulcy Fanny. Earleen Newport, DO

## 2021-01-15 NOTE — Discharge Instructions (Addendum)
Please call [facility] for any questions or concerns related to drain tube.

## 2021-01-15 NOTE — Progress Notes (Addendum)
Verbal order given from Dr. Maryclare Bean to administer 250mg  of ketamine po. Alana, CRNA drew up 250mg  of ketamine and mixed with 8 oz of lemon and lime soda. Pt drank all w/o any complications, niece [name] at the bedside.

## 2021-01-18 ENCOUNTER — Encounter (HOSPITAL_COMMUNITY): Payer: Self-pay | Admitting: Radiology

## 2021-01-18 NOTE — Anesthesia Postprocedure Evaluation (Signed)
Anesthesia Post Note  Patient: Samuel Shelton  Procedure(s) Performed: IR WITH ANESTHESIA CATERTER TUB [place] (N/A )     Patient location during evaluation: PACU Anesthesia Type: MAC Level of consciousness: awake and alert Pain management: pain level controlled Vital Signs Assessment: post-procedure vital signs reviewed and stable Respiratory status: spontaneous breathing, nonlabored ventilation, respiratory function stable and patient connected to nasal cannula oxygen Cardiovascular status: stable and blood pressure returned to baseline Postop Assessment: no apparent nausea or vomiting Anesthetic complications: no   No complications documented.  Last Vitals:  Vitals:   01/15/21 1450 01/15/21 1505  BP: (!) 136/93 (!) 149/99  Pulse: 80 80  Resp: 15 15  Temp:  (!) 36.2 C  SpO2: 96% 98%    Last Pain:  Vitals:   01/15/21 1136  TempSrc: Axillary                 Belenda Cruise P Lucus Lambertson

## 2021-01-28 ENCOUNTER — Other Ambulatory Visit: Payer: Self-pay | Admitting: Radiology

## 2021-02-03 ENCOUNTER — Encounter (HOSPITAL_COMMUNITY): Payer: Self-pay | Admitting: *Deleted

## 2021-02-03 NOTE — Progress Notes (Signed)
[place] #3  Surgical Instructions for Samuel Shelton Day of Surgery 02/04/21    Your procedure is scheduled on Thursday, 02/04/21.  Report to [facility] Main Entrance "A" at 8:15 A.M., then check in with the Admitting office.  Call this number if you have problems the morning of surgery:  [phone]   If you have any questions prior to your surgery date call [phone]: Open Monday-Friday 8am-4pm    Remember:  Do not eat after or drink midnight the night before your surgery (Wednesday)    Take these medicines the morning of surgery with A SIP OF WATER: Tylenol Protonix Proscar  As of today, STOP taking any Aspirin (unless otherwise instructed by your surgeon) Aleve, Naproxen, Ibuprofen, Motrin, Advil, Goody's, BC's, all herbal medications, fish oil, and all vitamins.                     Do not wear jewelry.            Do not wear lotions, powders, colognes, or deodorant.            Men may shave face and neck.            Do not bring valuables to the hospital.            [facility] is not responsible for any belongings or valuables.  Do NOT Smoke (Tobacco/Vaping) or drink Alcohol 24 hours prior to your procedure If you use a CPAP at night, you may bring all equipment for your overnight stay.   Contacts, glasses, dentures or bridgework may not be worn into surgery, please bring cases for these belongings    Patients discharged the day of surgery will not be allowed to drive home, and someone needs to stay with them for 24 hours.  Oral Hygiene is also important to reduce your risk of infection.  Remember - BRUSH YOUR TEETH THE MORNING OF SURGERY WITH YOUR REGULAR TOOTHPASTE   Day of Surgery: Wear Clean/Comfortable clothing the morning of surgery Do not apply any deodorants/lotions.   Remember to brush your teeth WITH YOUR REGULAR TOOTHPASTE.

## 2021-02-03 NOTE — Progress Notes (Signed)
Anesthesia Chart Review: SAME DAY WORK-UP   Case: [id number] Date/Time: 02/04/21 1100   Procedure: IR WITH ANESTHESIA CATHETER TUBE CHANGE (N/A )   Anesthesia type: General   Pre-op diagnosis: SUPRA  CATNERER REPLACMENT   Location: [place] [facility] [place] [facility]   Surgeons: Radiologist, Medication, MD      DISCUSSION: Patient is a 70 year old male scheduled for the above procedure. He is s/p suprapubic catheter 12/15/20 with exchange on 01/15/21.   History includes former smoker, intellectual disability, HTN, GERD, anemia, phimosis, prostate cancer (s/p  edema, CVA (01/2019, s/p tPA), COVID-19 01/11/20, wheelchair bound. 4.4 cm ascending thoracic aortic aneurysm by 08/03/20 CTA.  For COVID-19 test on the day of procedure. He lives at [facility].    VS:  BP Readings from Last 3 Encounters:  01/15/21 (!) 149/99  12/15/20 (!) 125/93  12/08/20 (!) 136/106   Pulse Readings from Last 3 Encounters:  01/15/21 80  12/15/20 79  12/08/20 (!) 107    PROVIDERS: [place], Rosalyn Charters, MD Hollice Espy, MD is urologist   LABS: For day of procedure.   IMAGES: 1V CXR 08/03/20: FINDINGS: Cardiomegaly, tortuous/ectatic aorta. No confluent airspace opacities, effusions or edema. No acute bony abnormality. IMPRESSION: Cardiomegaly.  No active disease.  CTA head/neck 08/03/20: IMPRESSION: CTA neck: 1. Streak artifact from a dense left-sided contrast bolus limits evaluation of the CCA and the left vertebral artery origin and V1 segment. Within this limitation, the bilateral common carotid, internal carotid and vertebral arteries are patent within the neck without appreciable hemodynamically significant stenosis. 2. Ascending thoracic aortic aneurysm measuring 4.4 cm in diameter. Recommend annual imaging followup by CTA or MRA. This recommendation follows 2010 ACCF/AHA/AATS/ACR/ASA/SCA/SCAI/SIR/STS/SVM Guidelines for the Diagnosis and Management of Patients with Thoracic Aortic Disease. Circulation.  2010; 121: X937-J696. Aortic aneurysm NOS (ICD10-I71.9)  CTA head: 1. A suboptimal intracranial contrast bolus limits evaluation. 2. No intracranial large vessel occlusion. 3. Intracranial atherosclerotic disease with multifocal stenoses, most notably as follows. 4. Moderate to moderately severe stenosis of the cavernous right internal carotid artery. 5. Moderate stenosis at the origin of a superior division M2 left MCA branch. 6. Moderate focal stenosis within a superior division mid to distal M2 left MCA branch. 7. Multifocal stenoses within a proximal to mid inferior division left M2 MCA branch with sites of up to severe stenosis. 8. Multifocal moderate/severe stenoses within mid to distal superior division right M2 MCA branches. 9. Sites of apparent high-grade stenosis within the A2/A3 left anterior cerebral artery. 10. Moderate stenosis within the V4 right vertebral artery. 11. Apparent high-grade stenosis within the P2 left PCA. 12. Some of the above described stenoses may be accentuated on the current examination due to the suboptimal contrast bolus.    EKG: 08/04/20: SR at 99 bpm. PVCs, Consider LVH. Lateral infarct. Anterior Q waves.    CV: Echo (Limited) 08/05/20: IMPRESSIONS  1. Left ventricular ejection fraction, by estimation, is 55 to 60%. The  left ventricle has normal function. The left ventricle has no regional  wall motion abnormalities. There is mild left ventricular hypertrophy.  2. Right ventricular systolic function is normal. The right ventricular  size is normal.  3. The aortic valve is tricuspid.  4. Aortic dilatation noted. There is mild dilatation of the aortic root  measuring 38 mm.  5. Limited echo, no doppler data.   Echo 01/29/19: IMPRESSIONS  1. The left ventricle has normal systolic function of 78-93%. The cavity  size is normal. There is concentric left ventricular  hypertrophy. Left  ventricular diastology could not be evaluated due  to indeterminent  diastolic function.  2. The right ventricle has normal systolic function. The cavity in normal  in size. There is no increase in right ventricular wall thickness.  3. The mitral valve is normal in structure There is mild thickening and  mild calcification of the anterior and posterior leaflets.  4. The aortic valve is tricuspid. There is mild sclerosis of the aortic  valve.    Past Medical History:  Diagnosis Date  . Anemia   . Chronic mental illness   . Edema of both legs   . Elevated PSA   . Elevated PSA   . GERD (gastroesophageal reflux disease)   . Hypertension   . Mental retardation   . Phimosis     Past Surgical History:  Procedure Laterality Date  . IR CATHETER TUBE CHANGE  01/15/2021  . IR GUIDED DRAIN W CATHETER PLACEMENT  12/15/2020  . IR REPLC GASTRO/COLONIC TUBE PERCUT W/FLUORO  08/20/2020  . PEG PLACEMENT N/A 01/24/2020   Procedure: PERCUTANEOUS ENDOSCOPIC GASTROSTOMY (PEG) PLACEMENT;  Surgeon: [name], MD;  Location: [facility];  Service: Endoscopy;  Laterality: N/A;  . PROSTATE BIOPSY N/A 06/22/2015   Procedure: PROSTATE BIOPSY;  Surgeon: Hollice Espy, MD;  Location: [facility] ORS;  Service: Urology;  Laterality: N/A;  . PROSTATE BIOPSY N/A 09/27/2016   Procedure: PROSTATE BIOPSY;  Surgeon: Hollice Espy, MD;  Location: [facility] ORS;  Service: Urology;  Laterality: N/A;  . RADIOLOGY WITH ANESTHESIA N/A 12/15/2020   Procedure: SUPERA PUBIC CATHETER PLACMENT in IR;  Surgeon: Suzette Battiest, MD;  Location: [place];  Service: Radiology;  Laterality: N/A;  . RADIOLOGY WITH ANESTHESIA N/A 01/15/2021   Procedure: IR WITH ANESTHESIA CATERTER TUB ECHANGE;  Surgeon: Radiologist, Medication, MD;  Location: [place];  Service: Radiology;  Laterality: N/A;    MEDICATIONS: No current facility-administered medications for this encounter.   Marland Kitchen acetaminophen (TYLENOL) 500 MG tablet  . aspirin 325 MG tablet  . ferrous sulfate 325 (65 FE) MG tablet  .  finasteride (PROSCAR) 5 MG tablet  . melatonin 3 MG TABS tablet  . pantoprazole sodium (PROTONIX) 40 mg/20 mL PACK  . Vitamin D, Ergocalciferol, (DRISDOL) 1.25 MG (50000 UNIT) CAPS capsule    Myra Gianotti, PA-C Surgical Short Stay/Anesthesiology [facility] Phone [phone] [facility] Phone [phone] 02/03/2021 4:37 PM

## 2021-02-03 NOTE — Anesthesia Preprocedure Evaluation (Addendum)
Anesthesia Evaluation  Patient identified by MRN, date of birth, ID band Patient awake    Reviewed: Allergy & Precautions, NPO status , Patient's Chart, lab work & pertinent test results  Airway Mallampati: II  TM Distance: >3 FB Neck ROM: Full    Dental  (+) Dental Advisory Given   Pulmonary former smoker,  COVID 01/11/20   Pulmonary exam normal breath sounds clear to auscultation       Cardiovascular hypertension, Normal cardiovascular exam Rhythm:Regular Rate:Normal  4.4 cm ascending thoracic aortic aneurysm by 08/03/20 CTA.  Echo (Limited) 08/05/20: IMPRESSIONS  1. Left ventricular ejection fraction, by estimation, is 55 to 60%. The  left ventricle has normal function. The left ventricle has no regional  wall motion abnormalities. There is mild left ventricular hypertrophy.  2. Right ventricular systolic function is normal. The right ventricular  size is normal.  3. The aortic valve is tricuspid.  4. Aortic dilatation noted. There is mild dilatation of the aortic root  measuring 38 mm.  5. Limited echo, no doppler data.    Neuro/Psych PSYCHIATRIC DISORDERS (severe MR) CVA    GI/Hepatic PUD, GERD  Medicated and Controlled,  Endo/Other    Renal/GU Renal InsufficiencyRenal diseaseCKD 3  negative genitourinary   Musculoskeletal negative musculoskeletal ROS (+)   Abdominal   Peds  Hematology   Anesthesia Other Findings For suprapubic catheter exchange   Lives at [facility] health, wheelchair bound   Reproductive/Obstetrics negative OB ROS                          Anesthesia Physical Anesthesia Plan  ASA: III  Anesthesia Plan: MAC   Post-op Pain Management:    Induction:   PONV Risk Score and Plan: 2 and Treatment may vary due to age or medical condition, Propofol infusion and TIVA  Airway Management Planned: Simple Face Mask and Natural Airway  Additional Equipment:  None  Intra-op Plan:   Post-operative Plan:   Informed Consent: I have reviewed the patients History and Physical, chart, labs and discussed the procedure including the risks, benefits and alternatives for the proposed anesthesia with the patient or authorized representative who has indicated his/her understanding and acceptance.     Dental advisory given and Consent reviewed with POA  Plan Discussed with: CRNA  Anesthesia Plan Comments: (Consent reviewed w/ niece Did well with previous anesthetic, will do 260m PO ketamine for sedation prior to IV placement; tolerating MAC well last time intraop)      Anesthesia Quick Evaluation

## 2021-02-03 NOTE — Progress Notes (Signed)
Called [facility] where patient resides in [place] [address], spoke with [place].  Nurse for patient is not available after multiple attempts by [facility].  Faxed instructions for DOS on 02/04/21 to [phone].

## 2021-02-04 ENCOUNTER — Ambulatory Visit (HOSPITAL_COMMUNITY): Payer: Medicare Other | Admitting: Vascular Surgery

## 2021-02-04 ENCOUNTER — Ambulatory Visit (HOSPITAL_COMMUNITY)
Admission: RE | Admit: 2021-02-04 | Discharge: 2021-02-04 | Disposition: A | Discharge status: Rehabilitation Facility | Payer: Medicare Other | Attending: Interventional Radiology | Admitting: Interventional Radiology

## 2021-02-04 ENCOUNTER — Encounter (HOSPITAL_COMMUNITY): Payer: Self-pay

## 2021-02-04 ENCOUNTER — Ambulatory Visit (HOSPITAL_COMMUNITY)
Admission: RE | Admit: 2021-02-04 | Discharge: 2021-02-04 | Disposition: A | Discharge status: Home / Self Care | Payer: Medicare Other | Source: Ambulatory Visit | Attending: Urology | Admitting: Urology

## 2021-02-04 ENCOUNTER — Encounter (HOSPITAL_COMMUNITY): Admission: RE | Disposition: A | Discharge status: Rehabilitation Facility | Payer: Self-pay | Source: Home / Self Care

## 2021-02-04 ENCOUNTER — Other Ambulatory Visit: Payer: Self-pay

## 2021-02-04 DIAGNOSIS — Z20822 Contact with and (suspected) exposure to covid-19: Secondary | ICD-10-CM | POA: Diagnosis not present

## 2021-02-04 DIAGNOSIS — R339 Retention of urine, unspecified: Secondary | ICD-10-CM | POA: Diagnosis not present

## 2021-02-04 DIAGNOSIS — T83028A Displacement of other indwelling urethral catheter, initial encounter: Secondary | ICD-10-CM | POA: Insufficient documentation

## 2021-02-04 DIAGNOSIS — Z8546 Personal history of malignant neoplasm of prostate: Secondary | ICD-10-CM | POA: Diagnosis not present

## 2021-02-04 DIAGNOSIS — Z7982 Long term (current) use of aspirin: Secondary | ICD-10-CM | POA: Insufficient documentation

## 2021-02-04 DIAGNOSIS — Z87891 Personal history of nicotine dependence: Secondary | ICD-10-CM | POA: Insufficient documentation

## 2021-02-04 DIAGNOSIS — F79 Unspecified intellectual disabilities: Secondary | ICD-10-CM | POA: Diagnosis not present

## 2021-02-04 DIAGNOSIS — Z79899 Other long term (current) drug therapy: Secondary | ICD-10-CM | POA: Insufficient documentation

## 2021-02-04 DIAGNOSIS — X58XXXA Exposure to other specified factors, initial encounter: Secondary | ICD-10-CM | POA: Insufficient documentation

## 2021-02-04 HISTORY — DX: Anemia, unspecified: D64.9

## 2021-02-04 HISTORY — PX: RADIOLOGY WITH ANESTHESIA: SHX6223

## 2021-02-04 LAB — SARS CORONAVIRUS 2 BY RT PCR (HOSPITAL ORDER, PERFORMED IN ~~LOC~~ HOSPITAL LAB): SARS Coronavirus 2: NEGATIVE

## 2021-02-04 SURGERY — IR WITH ANESTHESIA
Anesthesia: General

## 2021-02-04 MED ORDER — ONDANSETRON HCL 4 MG/2ML IJ SOLN
INTRAMUSCULAR | Status: DC | PRN
  Administered 2021-02-04: 4 mg via INTRAVENOUS

## 2021-02-04 MED ORDER — ORAL CARE MOUTH RINSE: 15.0000 mL | Freq: Once | OROMUCOSAL | Status: DC

## 2021-02-04 MED ORDER — CHLORHEXIDINE GLUCONATE 0.12 % MT SOLN
15.0000 mL | Freq: Once | OROMUCOSAL | Status: DC
  Filled 2021-02-04: qty 15

## 2021-02-04 MED ORDER — PROPOFOL 500 MG/50ML IV EMUL
INTRAVENOUS | Status: DC | PRN
  Administered 2021-02-04: 100 ug/kg/min via INTRAVENOUS

## 2021-02-04 MED ORDER — MIDAZOLAM HCL 5 MG/5ML IJ SOLN
INTRAMUSCULAR | Status: DC | PRN
  Administered 2021-02-04: 2 mg via INTRAVENOUS

## 2021-02-04 MED ORDER — LIDOCAINE HCL 1 % IJ SOLN
INTRAMUSCULAR | Status: AC
  Filled 2021-02-04: qty 20

## 2021-02-04 MED ORDER — CIPROFLOXACIN IN D5W 400 MG/200ML IV SOLN
400.0000 mg | Freq: Once | INTRAVENOUS | Status: AC
  Administered 2021-02-04: 400 mg via INTRAVENOUS
  Filled 2021-02-04: qty 200

## 2021-02-04 MED ORDER — IOHEXOL 300 MG/ML  SOLN
50.0000 mL | Freq: Once | INTRAMUSCULAR | Status: AC | PRN
  Administered 2021-02-04: 10 mL

## 2021-02-04 MED ORDER — LACTATED RINGERS IV SOLN: INTRAVENOUS | Status: DC

## 2021-02-04 MED ORDER — KETAMINE HCL 50 MG/ML IJ SOLN
0.5000 mg/kg | Freq: Once | INTRAMUSCULAR | Status: AC
  Administered 2021-02-04: 250 mg via INTRAMUSCULAR
  Filled 2021-02-04: qty 0.79

## 2021-02-04 MED ORDER — ONDANSETRON HCL 4 MG/2ML IJ SOLN: 4.0000 mg | Freq: Once | INTRAMUSCULAR | Status: DC | PRN

## 2021-02-04 MED ORDER — FENTANYL CITRATE (PF) 100 MCG/2ML IJ SOLN
INTRAMUSCULAR | Status: DC | PRN
  Administered 2021-02-04: 25 ug via INTRAVENOUS
  Administered 2021-02-04: 50 ug via INTRAVENOUS
  Administered 2021-02-04: 25 ug via INTRAVENOUS

## 2021-02-04 MED ORDER — LIDOCAINE HCL (PF) 1 % IJ SOLN
INTRAMUSCULAR | Status: DC | PRN
  Administered 2021-02-04: 5 mL

## 2021-02-04 MED ORDER — PHENYLEPHRINE HCL-NACL 10-0.9 MG/250ML-% IV SOLN
INTRAVENOUS | Status: DC | PRN
  Administered 2021-02-04: 30 ug/min via INTRAVENOUS

## 2021-02-04 MED ORDER — FENTANYL CITRATE (PF) 100 MCG/2ML IJ SOLN: 25.0000 ug | INTRAMUSCULAR | Status: DC | PRN

## 2021-02-04 NOTE — Anesthesia Postprocedure Evaluation (Signed)
Anesthesia Post Note  Patient: Samuel Shelton  Procedure(s) Performed: IR WITH ANESTHESIA CATHETER TUBE CHANGE (N/A )     Patient location during evaluation: PACU Anesthesia Type: MAC Level of consciousness: awake and alert, oriented and patient cooperative Pain management: pain level controlled Vital Signs Assessment: post-procedure vital signs reviewed and stable Respiratory status: spontaneous breathing, nonlabored ventilation and respiratory function stable Cardiovascular status: blood pressure returned to baseline and stable Postop Assessment: no apparent nausea or vomiting Anesthetic complications: no   No complications documented.  Last Vitals:  Vitals:   02/04/21 1350 02/04/21 1405  BP: 114/85 109/64  Pulse: 80 80  Resp: 13 14  Temp:  36.7 C  SpO2: 99% 96%    Last Pain:  Vitals:   02/04/21 0944  TempSrc: Oral                 Pervis Hocking

## 2021-02-04 NOTE — Progress Notes (Signed)
Pt given ketamine with lemon lime soda with Dr. Doroteo Glassman present.

## 2021-02-04 NOTE — Anesthesia Procedure Notes (Signed)
Procedure Name: MAC Date/Time: 02/04/2021 11:50 AM Performed by: Griffin Dakin, CRNA Pre-anesthesia Checklist: Patient identified, Emergency Drugs available, Suction available and Patient being monitored Patient Re-evaluated:Patient Re-evaluated prior to induction Oxygen Delivery Method: Simple face mask Induction Type: IV induction Placement Confirmation: positive ETCO2 and breath sounds checked- equal and bilateral Dental Injury: Teeth and Oropharynx as per pre-operative assessment

## 2021-02-04 NOTE — Procedures (Signed)
Interventional Radiology Procedure Note  Procedure:    New supra-pubic catheter placement, with rescue of displaced drain.  Serial dilation of the prior stenotic tract, and 20m balloon PTA of fibrotic narrowing at bladder entry site, impeding drain placement.   New 54F pigtail drain, to gravity.  ~500cc of foul smelling, cloudy urine with debris evacuated.   Anesthesia:  MAC today was adequate.   Complications: None Recommendations:  - OK for DC after recover - OK to resume routine catheter exchanges, with next scheduled in 2-3 months.  Will need anesthesia (MAC adequate) - routine wound care  Signed,  JDulcy Fanny WEarleen Newport DO

## 2021-02-04 NOTE — Transfer of Care (Signed)
Immediate Anesthesia Transfer of Care Note  Patient: Raymar Joiner  Procedure(s) Performed: IR WITH ANESTHESIA CATHETER TUBE CHANGE (N/A )  Patient Location: PACU  Anesthesia Type:MAC  Level of Consciousness: awake, alert  and oriented  Airway & Oxygen Therapy: Patient Spontanous Breathing and Patient connected to face mask oxygen  Post-op Assessment: Report given to RN and Post -op Vital signs reviewed and stable  Post vital signs: Reviewed and stable  Last Vitals:  Vitals Value Taken Time  BP 137/100 02/04/21 1249  Temp    Pulse 87 02/04/21 1253  Resp 23 02/04/21 1253  SpO2 100 % 02/04/21 1253  Vitals shown include unvalidated device data.  Last Pain:  Vitals:   02/04/21 0944  TempSrc: Oral         Complications: No complications documented.

## 2021-02-04 NOTE — H&P (Deleted)
  The note originally documented on this encounter has been moved the the encounter in which it belongs.  

## 2021-02-04 NOTE — H&P (Signed)
Chief Complaint: Dislodged suprapubic catheter  Referring Physician(s): Pass[place]atanzy A  Supervising Physician: Corrie Mckusick  Patient Status: [facility] - Out-pt  History of Present Illness: Samuel Shelton is a 70 y.o. male with past medical history of prostate cancer and incomplete bladder emptying.    Patient lives in a residential/skilled nursing facility.  His legal guardian is his niece Samuel Shelton.    He underwent SPT placement 12/15/20 with anesthesia due profound mental incapacitation.   He becomes easily restless and agitated with any manipulation.    He is here today because his SP tube has been dislodged completely.   Past Medical History:  Diagnosis Date  . Anemia   . Chronic mental illness   . Edema of both legs   . Elevated PSA   . Elevated PSA   . GERD (gastroesophageal reflux disease)   . Hypertension   . Mental retardation   . Phimosis     Past Surgical History:  Procedure Laterality Date  . IR CATHETER TUBE CHANGE  01/15/2021  . IR GUIDED DRAIN W CATHETER PLACEMENT  12/15/2020  . IR REPLC GASTRO/COLONIC TUBE PERCUT W/FLUORO  08/20/2020  . PEG PLACEMENT N/A 01/24/2020   Procedure: PERCUTANEOUS ENDOSCOPIC GASTROSTOMY (PEG) PLACEMENT;  Surgeon: [name], MD;  Location: [facility];  Service: Endoscopy;  Laterality: N/A;  . PROSTATE BIOPSY N/A 06/22/2015   Procedure: PROSTATE BIOPSY;  Surgeon: Hollice Espy, MD;  Location: [facility] ORS;  Service: Urology;  Laterality: N/A;  . PROSTATE BIOPSY N/A 09/27/2016   Procedure: PROSTATE BIOPSY;  Surgeon: Hollice Espy, MD;  Location: [facility] OR[facility];  Service: Urology;  Laterality: N/A;  . RADIOLOGY WITH ANESTHESIA N/A 12/15/2020   Procedure: SUPERA PUBIC CATHETER PLACMENT in IR;  Surgeon: Suzette Battiest, MD;  Location: [employer];  Service: Radiology;  Laterality: N/A;  . RADIOLOGY WITH ANESTHESIA N/A 01/15/2021   Procedure: IR WITH ANESTHESIA CATERTER TUB ECHANGE;  Surgeon: Radiologist, Medication, MD;  Location: [place];  Service: Radiology;  Laterality: N/A;    Allergies: Patient has no known allergies.  Medications: Prior to Admission medications   Medication Sig Start Date End Date Taking? Authorizing Provider  acetaminophen (TYLENOL) 500 MG tablet Take 1,000 mg by mouth in the morning and at bedtime.    [provider]  aspirin 325 MG tablet Take 1 tablet (325 mg total) by mouth daily. 08/08/20   Terrilee Croak, MD  ferrous sulfate 325 (65 FE) MG tablet Take 325 mg by mouth daily with breakfast.    [provider]  finasteride (PROSCAR) 5 MG tablet Take 1 tablet (5 mg total) by mouth daily. 09/05/18   Hollice Espy, MD  melatonin 3 MG TABS tablet Take 3 mg by mouth at bedtime.    [provider]  pantoprazole sodium (PROTONIX) 40 mg/20 mL PACK Take 40 mg by mouth daily.    [provider]  Vitamin D, Ergocalciferol, (DRISDOL) 1.25 MG (50000 UNIT) CAPS capsule Take 50,000 Units by mouth every Tuesday.    [provider]     Family History  Problem Relation Age of Onset  . Stroke Mother   . Hypertension Sister   . Diabetes type II Sister     Social History   Socioeconomic History  . Marital status: Single    Spouse name: Not on file  . Number of children: Not on file  . Years of education: Not on file  . Highest education level: Not on file  Occupational History  . Not on  file  Tobacco Use  . Smoking status: Former Smoker    Packs/day: 0.50    Types: Cigarettes  . Smokeless tobacco: Never Used  Vaping Use  . Vaping Use: Never used  Substance and Sexual Activity  . Alcohol use: No    Alcohol/week: 0.0 standard drinks  . Drug use: No  . Sexual activity: Not on file  Other Topics Concern  . Not on file  Social History Narrative  . Not on file   Social Determinants of Health   Financial Resource Strain: Not on file  Food Insecurity: Not on file  Transportation Needs: Not on file  Physical Activity: Not on file  Stress: Not on file   Social Connections: Not on file    Review of Systems  Unable to perform ROS: Psychiatric disorder    Vital Signs: There were no vitals taken for this visit.  Physical Exam HENT:     Head: Normocephalic and atraumatic.  Cardiovascular:     Rate and Rhythm: Normal rate and regular rhythm.  Pulmonary:     Effort: Pulmonary effort is normal. No respiratory distress.  Abdominal:     Palpations: Abdomen is soft.  Skin:    General: Skin is warm and dry.  Neurological:     Mental Status: Mental status is at baseline.     Imaging: IR Catheter Tube Change  Result Date: 01/15/2021 INDICATION: 70 year old male presents for exchange and up size of suprapubic catheter placed via CT guidance 11/09/2020 EXAM: IR CATHETER TUBE CHANGE COMPARISON:  CT drainage 11/09/2020. Exchange 12/15/2020, for a 14 [place] Foley catheter MEDICATIONS: Ciprofloxacin 400 mg IV; The antibiotic was administered in an appropriate time frame prior to skin puncture. ANESTHESIA/SEDATION: The anesthesia team was present to provide general endotracheal tube anesthesia and for patient monitoring during the procedure. Intubation was performed in IR. Interventional neuro radiology nursing staff was also present. CONTRAST:  26mL OMNIPAQUE IOHEXOL 300 MG/ML SOLN - administered into the collecting system(s) FLUOROSCOPY TIME:  Fluoroscopy Time: 7 minutes 6 seconds (122 mGy). COMPLICATIONS: 2 none PROCEDURE: Informed written consent was obtained from the patient's family after a thorough discussion of the procedural risks, benefits and alternatives. All questions were addressed. Maximal Sterile Barrier Technique was utilized including caps, mask, sterile gowns, sterile gloves, sterile drape, hand hygiene and skin antiseptic. A timeout was performed prior to the initiation of the procedure. After intubation, the patient was positioned supine on the fluoroscopy table. Scout images were acquired after the patient's pelvis was prepped and  draped in the usual sterile fashion, including the indwelling drain. The catheter was encrusted to the draining bag and had to be cut. Contrast was not able to be injected through the tube given the significant encrustation. We started with a Glidewire through the catheter, which would not pass through the encrusted tube. A Kumpe the catheter was then advanced into the urinary bladder in a tandem technique adjacent to the indwelling drain. Once the catheter was in position, we injected contrast confirming location. The sutures retaining the 14 French Foley were cut and the Foley was removed. This required significant traction force given the inflammation at the bladder wall. Catheter was removed with significant encrustation and some tissue on the tip of the catheter. A Bentson wire was passed through the Kumpe the catheter. We attempted a 67 French dilation which would not pass through the dense tissue tract. We then passed the Kumpe the catheter into the urinary bladder and exchanged for an Amplatz wire. We then  attempted 70 French dilation. This would not pass through the tract. We then started with 8 French dilation which was difficult in passing through the tissue tract given the degree of stenosis. Ten French dilation was performed. Twelve French dilation was performed. 70 French dilation was performed. All of these performed on the Amplatz wire with significant resistance to passing the dilator through the tissue tract. 62 French dilation was not completed. We ultimately recognize that a Foley catheter would not pass through the drain tract given the significant inflammation and the distortion of the urinary bladder. Thus we passed a 39 [place] biliary drain for the extra sideholes. Once the biliary drain was in position contrast confirmed location. Irrigation of the bladder was performed with saline and aspiration of all the contents. Catheter was sutured in position and attached to gravity  drainage. IMPRESSION: Status post exchange of blocked 14 French Foley catheter for a new 14 French pigtail drainage catheter with extra sideholes (biliary drain). Signed, Dulcy Fanny. Dellia Nims, [facility] Radiology PLAN: After discussion with the patient's primary urologist, short course of antibiotics will be prescribed with anticipated discharge today and a 4 week interval exchange for a new drain. Electronically Signed   By: Corrie Mckusick D.O.   On: 01/15/2021 14:14    Labs:  CBC: Recent Labs    08/03/20 1641 08/03/20 1648 08/03/20 2108 08/06/20 1100 12/15/20 0922 01/15/21 1202  WBC 9.1  --   --  7.1 4.8 5.4  HGB 10.7*   < > 12.6* 10.1* 11.3* 11.7*  HCT 36.1*   < > 37.0* 33.9* 39.0 39.4  PLT 335  --   --  384 387 336   < > = values in this interval not displayed.    COAGS: Recent Labs    08/03/20 1641 12/15/20 0922  INR 1.3* 1.1  APTT 36  --     BMP: Recent Labs    08/03/20 1641 08/03/20 1648 08/03/20 2108 08/06/20 1100 11/03/20 1319 12/15/20 1229 01/15/21 1202  NA 144 146* 146* 147*  --  144 150*  K 3.8 3.6 4.6 4.5  --  3.7 3.6  CL 108 110  --  113*  --  111 113*  CO2 24  --   --  23  --  22 24  GLUCOSE 102* 97  --  103*  --  99 93  BUN 34* 33*  --  15  --  21 27*  CALCIUM 9.1  --   --  9.1  --  9.3 9.3  CREATININE 1.51* 1.40*  --  1.03 0.88 1.11 0.93  GFRNONAA 46*  --   --  >60 86 >60 >60  GFRAA 53*  --   --  >60 100  --   --     LIVER FUNCTION TESTS: Recent Labs    08/03/20 1641  BILITOT 0.8  AST 62*  ALT 40  ALKPHOS 50  PROT 7.3  ALBUMIN 2.9*    TUMOR MARKERS: No results for input(s): AFPTM, CEA, CA199, CHROMGRNA in the last 8760 hours.  Assessment and Plan:  Prostate cancer and incomplete bladder emptying.  S/P tube dislodged.  Will proceed with image guided placement of a suprapubic catheter today by Dr. Earleen Newport.  Risks and benefits discussed with the patient including bleeding,  infection, damage to adjacent structures, and need for additional procedures.  All of the patient's questions were answered, patient is agreeable to proceed. Consent signed and in chart.  Thank you for this interesting consult.  I greatly enjoyed meeting Hassen Bruun and look forward to participating in their care.  A copy of this report was sent to the requesting provider on this date.  Electronically Signed: Murrell Redden, PA-C   02/04/2021, 11:17 AM      I spent a total of  15 Minutes in face to face in clinical consultation, greater than 50% of which was counseling/coordinating care for S/P cath placement.

## 2021-02-04 NOTE — Progress Notes (Signed)
Ketamine 50mg /ml, 250mg = 78ml to be given orally by Dr. Doroteo Glassman.  35ml waster in sharps container by Eilene Ghazi, [place], CPHT witness.   Vitaly Wanat S. Alford Highland, PharmD, BCPS Clinical Staff Pharmacist [email or web address]

## 2021-02-05 ENCOUNTER — Encounter (HOSPITAL_COMMUNITY): Payer: Self-pay | Admitting: Radiology

## 2021-02-05 NOTE — Addendum Note (Signed)
Encounter addended by: Riley Churches on: 02/05/2021 1:50 PM  Actions taken: Imaging Exam ended, Charge Capture section accepted

## 2021-02-16 ENCOUNTER — Telehealth: Payer: Self-pay | Admitting: Urology

## 2021-02-17 NOTE — Telephone Encounter (Signed)
This patient's niece called concerned about his SPT tube being infected and needing to be seen sooner, can someone please call the facility to discuss this and find out if he needs to be seen sooner than 02/24/21. Her name is Elzie Rings @ [facility] [phone]  Thanks, Sharyn Lull

## 2021-02-18 NOTE — Telephone Encounter (Signed)
Called [facility] and spoke w/[facility] patient's nurse caregivers and she states that patient's stoma site is normal in appearance. There is no evidence of infection.  No discharge at site or redness.  She did state that last week niece expressed concern to them about infection and the doctor at the facility did start Mr. Leeds on abx for UTI because there was mucus/sediment in the cath bag and dark urine. Patient did not have fever, chills or any other symptoms. It was explained that typically with a chronic foley we would not treat unless patient was symptomatic due to chronic colonization with foley present. She verbalized understanding.  Called and spoke with patient's niece to give her an update on patient's condition. Explained stoma site secretion/mucus is normal and dark urine in his bag could be due to lack of water intake or possible blood which is normal to see some blood with cath in place. He should keep follow up as scheduled. She verbalized understanding and will be at his visit next week.

## 2021-02-23 NOTE — Progress Notes (Signed)
Suprapubic Cath Change  Patient is present today for a suprapubic catheter change due to urinary retention.  ml of water was drained from the balloon, a 16 FR pig tail drainage cath was removed from the tract with out difficulty.  Site was cleaned and prepped in a sterile fashion with betadine.  A 16 FR foley cath was replaced into the tract no complications were noted. Urine return was noted, 10 ml of sterile water was inflated into the balloon and a leg bag was attached for drainage.  Patient tolerated well.   Performed by: Myself and Bradly Bienenstock, CMA  Follow up: One month for SPT exchange

## 2021-02-24 ENCOUNTER — Ambulatory Visit (INDEPENDENT_AMBULATORY_CARE_PROVIDER_SITE_OTHER): Payer: Medicare Other | Admitting: Urology

## 2021-02-24 ENCOUNTER — Other Ambulatory Visit: Payer: Self-pay

## 2021-02-24 DIAGNOSIS — Z9359 Other cystostomy status: Secondary | ICD-10-CM

## 2021-03-04 ENCOUNTER — Ambulatory Visit: Payer: Medicare Other | Admitting: Urology

## 2021-03-23 NOTE — Progress Notes (Signed)
Suprapubic Cath Change  Patient is present today for a suprapubic catheter change due to urinary retention.  9 ml of water was drained from the balloon, a 16 FR foley cath was removed from the tract with out difficulty.  Site was cleaned and prepped in a sterile fashion with betadine.  A 16 FR foley cath was replaced into the tract no complications were noted. Urine return was noted, 10 ml of sterile water was inflated into the balloon and a leg bag was attached for drainage.  Patient tolerated well. A night bag was given to patient and proper instruction was given on how to switch bags.    Performed by: Zara Council, PA-C and Dolores Lory, CMA  Follow up: One month for SPT exchange

## 2021-03-24 ENCOUNTER — Other Ambulatory Visit: Payer: Self-pay

## 2021-03-24 ENCOUNTER — Encounter: Payer: Self-pay | Admitting: Urology

## 2021-03-24 ENCOUNTER — Ambulatory Visit (INDEPENDENT_AMBULATORY_CARE_PROVIDER_SITE_OTHER): Payer: Medicare Other | Admitting: Urology

## 2021-03-24 DIAGNOSIS — Z9359 Other cystostomy status: Secondary | ICD-10-CM

## 2021-03-31 ENCOUNTER — Emergency Department: Payer: Medicare Other

## 2021-03-31 ENCOUNTER — Ambulatory Visit (INDEPENDENT_AMBULATORY_CARE_PROVIDER_SITE_OTHER): Payer: Medicare Other | Admitting: Urology

## 2021-03-31 ENCOUNTER — Other Ambulatory Visit: Payer: Self-pay

## 2021-03-31 ENCOUNTER — Telehealth: Payer: Self-pay | Admitting: Urology

## 2021-03-31 ENCOUNTER — Inpatient Hospital Stay
Admission: EM | Admit: 2021-03-31 | Discharge: 2021-04-06 | DRG: 698 | Disposition: A | Discharge status: Skilled Nursing Facility | Payer: Medicare Other | Source: Skilled Nursing Facility | Attending: Internal Medicine | Admitting: Internal Medicine

## 2021-03-31 VITALS — HR 120 | Temp 102.8°F

## 2021-03-31 DIAGNOSIS — F79 Unspecified intellectual disabilities: Secondary | ICD-10-CM | POA: Diagnosis present

## 2021-03-31 DIAGNOSIS — Z515 Encounter for palliative care: Secondary | ICD-10-CM | POA: Diagnosis not present

## 2021-03-31 DIAGNOSIS — Z20822 Contact with and (suspected) exposure to covid-19: Secondary | ICD-10-CM | POA: Diagnosis present

## 2021-03-31 DIAGNOSIS — Z8249 Family history of ischemic heart disease and other diseases of the circulatory system: Secondary | ICD-10-CM

## 2021-03-31 DIAGNOSIS — R4182 Altered mental status, unspecified: Secondary | ICD-10-CM

## 2021-03-31 DIAGNOSIS — N179 Acute kidney failure, unspecified: Secondary | ICD-10-CM | POA: Diagnosis present

## 2021-03-31 DIAGNOSIS — N319 Neuromuscular dysfunction of bladder, unspecified: Secondary | ICD-10-CM

## 2021-03-31 DIAGNOSIS — Z7982 Long term (current) use of aspirin: Secondary | ICD-10-CM | POA: Diagnosis not present

## 2021-03-31 DIAGNOSIS — A419 Sepsis, unspecified organism: Secondary | ICD-10-CM | POA: Diagnosis not present

## 2021-03-31 DIAGNOSIS — Z79899 Other long term (current) drug therapy: Secondary | ICD-10-CM

## 2021-03-31 DIAGNOSIS — R6520 Severe sepsis without septic shock: Secondary | ICD-10-CM | POA: Diagnosis present

## 2021-03-31 DIAGNOSIS — G9341 Metabolic encephalopathy: Secondary | ICD-10-CM | POA: Diagnosis present

## 2021-03-31 DIAGNOSIS — R6250 Unspecified lack of expected normal physiological development in childhood: Secondary | ICD-10-CM | POA: Diagnosis present

## 2021-03-31 DIAGNOSIS — Z833 Family history of diabetes mellitus: Secondary | ICD-10-CM | POA: Diagnosis not present

## 2021-03-31 DIAGNOSIS — Z66 Do not resuscitate: Secondary | ICD-10-CM | POA: Diagnosis present

## 2021-03-31 DIAGNOSIS — R29810 Facial weakness: Secondary | ICD-10-CM | POA: Diagnosis not present

## 2021-03-31 DIAGNOSIS — E785 Hyperlipidemia, unspecified: Secondary | ICD-10-CM | POA: Diagnosis present

## 2021-03-31 DIAGNOSIS — N12 Tubulo-interstitial nephritis, not specified as acute or chronic: Secondary | ICD-10-CM | POA: Diagnosis present

## 2021-03-31 DIAGNOSIS — E860 Dehydration: Secondary | ICD-10-CM | POA: Diagnosis present

## 2021-03-31 DIAGNOSIS — I129 Hypertensive chronic kidney disease with stage 1 through stage 4 chronic kidney disease, or unspecified chronic kidney disease: Secondary | ICD-10-CM | POA: Diagnosis present

## 2021-03-31 DIAGNOSIS — Z823 Family history of stroke: Secondary | ICD-10-CM | POA: Diagnosis not present

## 2021-03-31 DIAGNOSIS — N183 Chronic kidney disease, stage 3 unspecified: Secondary | ICD-10-CM | POA: Diagnosis present

## 2021-03-31 DIAGNOSIS — Z7189 Other specified counseling: Secondary | ICD-10-CM | POA: Diagnosis not present

## 2021-03-31 DIAGNOSIS — I6930 Unspecified sequelae of cerebral infarction: Secondary | ICD-10-CM | POA: Diagnosis not present

## 2021-03-31 DIAGNOSIS — A4159 Other Gram-negative sepsis: Secondary | ICD-10-CM | POA: Diagnosis present

## 2021-03-31 DIAGNOSIS — E871 Hypo-osmolality and hyponatremia: Secondary | ICD-10-CM | POA: Diagnosis present

## 2021-03-31 DIAGNOSIS — E870 Hyperosmolality and hypernatremia: Secondary | ICD-10-CM | POA: Diagnosis present

## 2021-03-31 DIAGNOSIS — R339 Retention of urine, unspecified: Secondary | ICD-10-CM | POA: Diagnosis not present

## 2021-03-31 DIAGNOSIS — N390 Urinary tract infection, site not specified: Secondary | ICD-10-CM | POA: Diagnosis not present

## 2021-03-31 DIAGNOSIS — K219 Gastro-esophageal reflux disease without esophagitis: Secondary | ICD-10-CM | POA: Diagnosis present

## 2021-03-31 DIAGNOSIS — T83518A Infection and inflammatory reaction due to other urinary catheter, initial encounter: Principal | ICD-10-CM | POA: Diagnosis present

## 2021-03-31 DIAGNOSIS — Y846 Urinary catheterization as the cause of abnormal reaction of the patient, or of later complication, without mention of misadventure at the time of the procedure: Secondary | ICD-10-CM | POA: Diagnosis present

## 2021-03-31 DIAGNOSIS — R509 Fever, unspecified: Secondary | ICD-10-CM | POA: Diagnosis not present

## 2021-03-31 LAB — COMPREHENSIVE METABOLIC PANEL
ALT: 24 U/L (ref 0–44)
AST: 25 U/L (ref 15–41)
Albumin: 3.7 g/dL (ref 3.5–5.0)
Alkaline Phosphatase: 53 U/L (ref 38–126)
Anion gap: 11 (ref 5–15)
BUN: 48 mg/dL — ABNORMAL HIGH (ref 8–23)
CO2: 24 mmol/L (ref 22–32)
Calcium: 9.7 mg/dL (ref 8.9–10.3)
Chloride: 116 mmol/L — ABNORMAL HIGH (ref 98–111)
Creatinine, Ser: 1.45 mg/dL — ABNORMAL HIGH (ref 0.61–1.24)
GFR, Estimated: 51 mL/min — ABNORMAL LOW (ref >60)
Glucose, Bld: 107 mg/dL — ABNORMAL HIGH (ref 70–99)
Potassium: 4 mmol/L (ref 3.5–5.1)
Sodium: 151 mmol/L — ABNORMAL HIGH (ref 135–145)
Total Bilirubin: 1.8 mg/dL — ABNORMAL HIGH (ref 0.3–1.2)
Total Protein: 8.2 g/dL — ABNORMAL HIGH (ref 6.5–8.1)

## 2021-03-31 LAB — CBC WITH DIFFERENTIAL/PLATELET
Abs Immature Granulocytes: 0.03 10*3/uL (ref 0.00–0.07)
Basophils Absolute: 0 10*3/uL (ref 0.0–0.1)
Basophils Relative: 0 %
Eosinophils Absolute: 0 10*3/uL (ref 0.0–0.5)
Eosinophils Relative: 0 %
HCT: 41.3 % (ref 39.0–52.0)
Hemoglobin: 12.8 g/dL — ABNORMAL LOW (ref 13.0–17.0)
Immature Granulocytes: 0 %
Lymphocytes Relative: 7 %
Lymphs Abs: 0.6 10*3/uL — ABNORMAL LOW (ref 0.7–4.0)
MCH: 27.4 pg (ref 26.0–34.0)
MCHC: 31 g/dL (ref 30.0–36.0)
MCV: 88.4 fL (ref 80.0–100.0)
Monocytes Absolute: 0.5 10*3/uL (ref 0.1–1.0)
Monocytes Relative: 5 %
Neutro Abs: 8.1 10*3/uL — ABNORMAL HIGH (ref 1.7–7.7)
Neutrophils Relative %: 88 %
Platelets: 266 10*3/uL (ref 150–400)
RBC: 4.67 MIL/uL (ref 4.22–5.81)
RDW: 17.1 % — ABNORMAL HIGH (ref 11.5–15.5)
Smear Review: NORMAL
WBC: 9 10*3/uL (ref 4.0–10.5)
nRBC: 0 % (ref 0.0–0.2)

## 2021-03-31 LAB — URINALYSIS, COMPLETE (UACMP) WITH MICROSCOPIC
Bilirubin Urine: NEGATIVE
Glucose, UA: NEGATIVE mg/dL
Ketones, ur: NEGATIVE mg/dL
Nitrite: POSITIVE — AB
Protein, ur: 100 mg/dL — AB
RBC / HPF: >50 RBC/hpf — ABNORMAL HIGH (ref 0–5)
Specific Gravity, Urine: 1.024 (ref 1.005–1.030)
Squamous Epithelial / HPF: NONE SEEN (ref 0–5)
WBC, UA: >50 WBC/hpf — ABNORMAL HIGH (ref 0–5)
pH: 6 (ref 5.0–8.0)

## 2021-03-31 LAB — RESP PANEL BY RT-PCR (FLU A&B, COVID) ARPGX2
Influenza A by PCR: NEGATIVE
Influenza B by PCR: NEGATIVE
SARS Coronavirus 2 by RT PCR: NEGATIVE

## 2021-03-31 LAB — PROTIME-INR
INR: 1.2 (ref 0.8–1.2)
Prothrombin Time: 15 seconds (ref 11.4–15.2)

## 2021-03-31 LAB — LACTIC ACID, PLASMA: Lactic Acid, Venous: 1.4 mmol/L (ref 0.5–1.9)

## 2021-03-31 LAB — APTT: aPTT: 33 seconds (ref 24–36)

## 2021-03-31 MED ORDER — LACTATED RINGERS IV BOLUS (SEPSIS)
500.0000 mL | Freq: Once | INTRAVENOUS | Status: AC
  Administered 2021-03-31: 500 mL via INTRAVENOUS

## 2021-03-31 MED ORDER — HEPARIN SODIUM (PORCINE) 5000 UNIT/ML IJ SOLN
5000.0000 [IU] | Freq: Three times a day (TID) | INTRAMUSCULAR | Status: DC
  Administered 2021-03-31 – 2021-04-01 (×3): 5000 [IU] via SUBCUTANEOUS
  Filled 2021-03-31 (×3): qty 1

## 2021-03-31 MED ORDER — DEXTROSE-NACL 5-0.45 % IV SOLN: INTRAVENOUS | Status: DC

## 2021-03-31 MED ORDER — ACETAMINOPHEN 650 MG RE SUPP
650.0000 mg | Freq: Once | RECTAL | Status: AC
  Administered 2021-03-31: 650 mg via RECTAL
  Filled 2021-03-31: qty 1

## 2021-03-31 MED ORDER — ONDANSETRON HCL 4 MG/2ML IJ SOLN: 4.0000 mg | Freq: Four times a day (QID) | INTRAMUSCULAR | Status: DC | PRN

## 2021-03-31 MED ORDER — LACTATED RINGERS IV BOLUS (SEPSIS)
1000.0000 mL | Freq: Once | INTRAVENOUS | Status: AC
  Administered 2021-03-31: 1000 mL via INTRAVENOUS

## 2021-03-31 MED ORDER — LACTATED RINGERS IV SOLN: INTRAVENOUS | Status: DC

## 2021-03-31 MED ORDER — ONDANSETRON HCL 4 MG PO TABS: 4.0000 mg | ORAL_TABLET | Freq: Four times a day (QID) | ORAL | Status: DC | PRN

## 2021-03-31 MED ORDER — LACTATED RINGERS IV BOLUS (SEPSIS): 500.0000 mL | Freq: Once | INTRAVENOUS | Status: DC

## 2021-03-31 MED ORDER — ACETAMINOPHEN 325 MG PO TABS
650.0000 mg | ORAL_TABLET | Freq: Once | ORAL | Status: DC | PRN
  Filled 2021-03-31: qty 2

## 2021-03-31 MED ORDER — ACETAMINOPHEN 650 MG RE SUPP
650.0000 mg | Freq: Four times a day (QID) | RECTAL | Status: DC | PRN
  Administered 2021-03-31 – 2021-04-01 (×3): 650 mg via RECTAL
  Filled 2021-03-31 (×4): qty 1

## 2021-03-31 MED ORDER — SODIUM CHLORIDE 0.9 % IV SOLN
2.0000 g | Freq: Once | INTRAVENOUS | Status: AC
  Administered 2021-03-31: 2 g via INTRAVENOUS
  Filled 2021-03-31: qty 2

## 2021-03-31 MED ORDER — LACTATED RINGERS IV BOLUS
250.0000 mL | Freq: Once | INTRAVENOUS | Status: AC
  Administered 2021-04-01: 250 mL via INTRAVENOUS

## 2021-03-31 MED ORDER — LACTATED RINGERS IV BOLUS (SEPSIS): 1000.0000 mL | Freq: Once | INTRAVENOUS | Status: DC

## 2021-03-31 MED ORDER — ALBUTEROL SULFATE (2.5 MG/3ML) 0.083% IN NEBU: 2.5000 mg | INHALATION_SOLUTION | RESPIRATORY_TRACT | Status: DC | PRN

## 2021-03-31 MED ORDER — SODIUM CHLORIDE 0.9 % IV SOLN
2.0000 g | Freq: Two times a day (BID) | INTRAVENOUS | Status: DC
  Filled 2021-03-31: qty 2

## 2021-03-31 MED ORDER — ACETAMINOPHEN 325 MG PO TABS: 650.0000 mg | ORAL_TABLET | Freq: Four times a day (QID) | ORAL | Status: DC | PRN

## 2021-03-31 NOTE — Progress Notes (Signed)
   03/31/21 1908  Assess: MEWS Score  Temp (!) 102.3 F (39.1 C)  BP (!) 142/47  Pulse Rate (!) 120  Resp 18  Level of Consciousness Responds to Voice  SpO2 98 %  O2 Device Room Air  Assess: MEWS Score  MEWS Temp 2  MEWS Systolic 0  MEWS Pulse 2  MEWS RR 0  MEWS LOC 1  MEWS Score 5  MEWS Score Color Red  Assess: if the MEWS score is Yellow or Red  Were vital signs taken at a resting state? Yes  Focused Assessment No change from prior assessment  Early Detection of Sepsis Score *See Row Information* High  MEWS guidelines implemented *See Row Information* Yes  Treat  MEWS Interventions Administered prn meds/treatments  Pain Scale Faces  Faces Pain Scale 0  Breathing 0  Negative Vocalization 0  Facial Expression 0  Body Language 0  Consolability 0  PAINAD Score 0  Take Vital Signs  Increase Vital Sign Frequency  Red: Q 1hr X 4 then Q 4hr X 4, if remains red, continue Q 4hrs  Escalate  MEWS: Escalate Red: discuss with charge nurse/RN and provider, consider discussing with RRT  Notify: Charge Nurse/RN  Name of Charge Nurse/RN Notified [place], RN  Date Charge Nurse/RN Notified 03/31/21  Time Charge Nurse/RN Notified 1930  Notify: Provider  Provider Name/Title Sharion Settler, NP  Date Provider Notified 03/31/21  Time Provider Notified 1945  Notification Type Page  Notification Reason Other (Comment) (Red MEWS)  Provider response No new orders

## 2021-03-31 NOTE — ED Notes (Addendum)
Pt comes from Urology with c/o urinary retention and possible infection. Per PA pt's suprapubic had not drained any urine for two days. Pt's cath was changed today by PA and 861ml of straight pus. Pt has fever present.  Pt states at [facility]

## 2021-03-31 NOTE — Consult Note (Signed)
PHARMACY -  BRIEF ANTIBIOTIC NOTE   Pharmacy has received consult(s) for UTI from an ED provider.  The patient's profile has been reviewed for ht/wt/allergies/indication/available labs.    One time order(s) placed for cefepime  Further antibiotics/pharmacy consults should be ordered by admitting physician if indicated.                       Thank you, Samuel Shelton 03/31/2021  3:27 PM

## 2021-03-31 NOTE — ED Provider Notes (Signed)
[facility] Emergency Department Provider Note   ____________________________________________   Event Date/Time   First MD Initiated Contact with Patient 03/31/21 1456     (approximate)  I have reviewed the triage vital signs and the nursing notes.   HISTORY  Chief Complaint infection    HPI Samuel Shelton is a 70 y.o. male with past medical history of hypertension, hyperlipidemia, CKD, stroke, and intellectual disability who presents to the ED for altered mental status.  Patient has a chronic suprapubic catheter that was replaced in the urology office 1 week ago.  He now arrives from nursing facility after the catheter has apparently not been draining any urine for the past 2 days.  Patient was evaluated again in the urology office where suprapubic catheter was exchanged and patient reportedly drained 800 cc of purulent urine.  He was subsequently sent to the ED for further evaluation.  Patient is minimally verbal at baseline, unable to provide any history today due to somnolence.  Patient's niece is his POA and at bedside, states he was doing well a few days ago but is now much sleepier than usual.        Past Medical History:  Diagnosis Date  . Anemia   . Chronic mental illness   . Edema of both legs   . Elevated PSA   . Elevated PSA   . GERD (gastroesophageal reflux disease)   . Hypertension   . Mental retardation   . Phimosis     Patient Active Problem List   Diagnosis Date Noted  . Acute encephalopathy 08/04/2020  . Right sided weakness 08/03/2020  . CKD (chronic kidney disease), stage III ([place]) 08/03/2020  . Acute lower UTI 08/03/2020  . Gross hematuria   . Oropharyngeal dysphagia   . Acute gastritis without hemorrhage   . Postpyloric ulcer   . Protein-calorie malnutrition, severe ([place])   . Palliative care by specialist   . DNR (do not resuscitate) discussion   . Acute hypernatremia 01/08/2020  . Hypercalcemia 01/08/2020  . Acute  metabolic encephalopathy 02/40/9735  . COVID-19 virus infection 01/08/2020  . Acute renal failure ([place]) 01/08/2020  . Dehydration 01/08/2020  . Elevated troponin 01/08/2020  . Lactic acidosis 01/08/2020  . Altered mental status   . Hyperlipidemia 01/29/2019  . Family hx-stroke 01/29/2019  . BPH (benign prostatic hyperplasia) 01/29/2019  . Stroke (cerebrum) ([place]) 01/28/2019  . Edema leg 08/20/2014  . Chronic mental disorder 08/20/2014  . HTN (hypertension) 08/20/2014  . Intellectual disability 08/20/2014    Past Surgical History:  Procedure Laterality Date  . IR CATHETER TUBE CHANGE  01/15/2021  . IR GUIDED DRAIN W CATHETER PLACEMENT  12/15/2020  . IR REPLC GASTRO/COLONIC TUBE PERCUT W/FLUORO  08/20/2020  . PEG PLACEMENT N/A 01/24/2020   Procedure: PERCUTANEOUS ENDOSCOPIC GASTROSTOMY (PEG) PLACEMENT;  Surgeon: [name], MD;  Location: [facility] ENDOSCOPY;  Service: Endoscopy;  Laterality: N/A;  . PROSTATE BIOPSY N/A 06/22/2015   Procedure: PROSTATE BIOPSY;  Surgeon: Hollice Espy, MD;  Location: [facility] ORS;  Service: Urology;  Laterality: N/A;  . PROSTATE BIOPSY N/A 09/27/2016   Procedure: PROSTATE BIOPSY;  Surgeon: Hollice Espy, MD;  Location: [facility] ORS;  Service: Urology;  Laterality: N/A;  . RADIOLOGY WITH ANESTHESIA N/A 12/15/2020   Procedure: SUPERA PUBIC CATHETER PLACMENT in IR;  Surgeon: Suzette Battiest, MD;  Location: [facility];  Service: Radiology;  Laterality: N/A;  . RADIOLOGY WITH ANESTHESIA N/A 01/15/2021   Procedure: IR WITH ANESTHESIA CATERTER TUB ECHANGE;  Surgeon:  Radiologist, Medication, MD;  Location: [facility];  Service: Radiology;  Laterality: N/A;  . RADIOLOGY WITH ANESTHESIA N/A 02/04/2021   Procedure: IR WITH ANESTHESIA CATHETER TUBE CHANGE;  Surgeon: Radiologist, Medication, MD;  Location: [facility];  Service: Radiology;  Laterality: N/A;    Prior to Admission medications   Medication Sig Start Date End Date Taking? Authorizing Provider  acetaminophen (TYLENOL) 500 MG tablet  Take 1,000 mg by mouth in the morning and at bedtime.    [provider]  aspirin 325 MG tablet Take 1 tablet (325 mg total) by mouth daily. 08/08/20   Terrilee Croak, MD  ferrous sulfate 325 (65 FE) MG tablet Take 325 mg by mouth daily with breakfast.    [provider]  finasteride (PROSCAR) 5 MG tablet Take 1 tablet (5 mg total) by mouth daily. 09/05/18   Hollice Espy, MD  melatonin 3 MG TABS tablet Take 3 mg by mouth at bedtime.    [provider]  pantoprazole sodium (PROTONIX) 40 mg/20 mL PACK Take 40 mg by mouth daily.    [provider]  Vitamin D, Ergocalciferol, (DRISDOL) 1.25 MG (50000 UNIT) CAPS capsule Take 50,000 Units by mouth every Tuesday.    [provider]    Allergies Patient has no known allergies.  Family History  Problem Relation Age of Onset  . Stroke Mother   . Hypertension Sister   . Diabetes type II Sister     Social History Social History   Tobacco Use  . Smoking status: Former Smoker    Packs/day: 0.50    Types: Cigarettes  . Smokeless tobacco: Never Used  Vaping Use  . Vaping Use: Never used  Substance Use Topics  . Alcohol use: No    Alcohol/week: 0.0 standard drinks  . Drug use: No    Review of Systems Unable to obtain secondary to altered mental status  ____________________________________________   PHYSICAL EXAM:  VITAL SIGNS: ED Triage Vitals  Enc Vitals Group     BP 03/31/21 1427 103/72     Pulse Rate 03/31/21 1427 (!) 124     Resp 03/31/21 1427 18     Temp 03/31/21 1427 (!) 101.2 F (38.4 C)     Temp Source 03/31/21 1427 Oral     SpO2 03/31/21 1427 98 %     Weight --      Height 03/31/21 1424 5\' 5"  (1.651 m)     Head Circumference --      Peak Flow --      Pain Score --      Pain Loc --      Pain Edu? --      Excl. in [place]? --     Constitutional: Somnolent, opens eyes to voice but nonverbal. Eyes: Conjunctivae are normal.  Pupils equal round and reactive to light  bilaterally. Head: Atraumatic. Nose: No congestion/rhinnorhea. Mouth/Throat: Mucous membranes are dry. Neck: Normal ROM Cardiovascular: Tachycardic, regular rhythm. Grossly normal heart sounds. Respiratory: Normal respiratory effort.  No retractions. Lungs CTAB. Gastrointestinal: Soft and nontender. No distention.  Suprapubic catheter in place draining purulent appearing urine. Genitourinary: deferred Musculoskeletal: No lower extremity tenderness nor edema. Neurologic: Withdraws from pain in all 4 extremities, unable to follow commands. Skin:  Skin is warm, dry and intact. No rash noted. Psychiatric: Unable to assess.  ____________________________________________   LABS (all labs ordered are listed, but only abnormal results are displayed)  Labs Reviewed  COMPREHENSIVE METABOLIC PANEL - Abnormal; Notable for the following components:  Result Value   Sodium 151 (*)    Chloride 116 (*)    Glucose, Bld 107 (*)    BUN 48 (*)    Creatinine, Ser 1.45 (*)    Total Protein 8.2 (*)    Total Bilirubin 1.8 (*)    GFR, Estimated 51 (*)    All other components within normal limits  CBC WITH DIFFERENTIAL/PLATELET - Abnormal; Notable for the following components:   Hemoglobin 12.8 (*)    RDW 17.1 (*)    Neutro Abs 8.1 (*)    Lymphs Abs 0.6 (*)    All other components within normal limits  URINALYSIS, COMPLETE (UACMP) WITH MICROSCOPIC - Abnormal; Notable for the following components:   Color, Urine YELLOW (*)    APPearance TURBID (*)    Hgb urine dipstick LARGE (*)    Protein, ur 100 (*)    Nitrite POSITIVE (*)    Leukocytes,Ua MODERATE (*)    RBC / HPF >50 (*)    WBC, UA >50 (*)    Bacteria, UA MANY (*)    All other components within normal limits  CULTURE, BLOOD (ROUTINE X 2)  CULTURE, BLOOD (ROUTINE X 2)  RESP PANEL BY RT-PCR (FLU A&B, COVID) ARPGX2  URINE CULTURE  LACTIC ACID, PLASMA  PROTIME-INR  APTT   ____________________________________________  EKG  ED ECG  REPORT I, Blake Divine, the attending physician, personally viewed and interpreted this ECG.   Date: 03/31/2021  EKG Time: 14:43  Rate: 121  Rhythm: sinus tachycardia  Axis: Normal  Intervals:none  ST&T Change: LVH   PROCEDURES  Procedure(s) performed (including Critical Care):  .Critical Care Performed by: Blake Divine, MD Authorized by: Blake Divine, MD   Critical care provider statement:    Critical care time (minutes):  45   Critical care time was exclusive of:  Separately billable procedures and treating other patients and teaching time   Critical care was necessary to treat or prevent imminent or life-threatening deterioration of the following conditions:  Sepsis   Critical care was time spent personally by me on the following activities:  Discussions with consultants, evaluation of patient's response to treatment, examination of patient, ordering and performing treatments and interventions, ordering and review of laboratory studies, ordering and review of radiographic studies, pulse oximetry, re-evaluation of patient's condition, obtaining history from patient or surrogate and review of old charts   I assumed direction of critical care for this patient from another provider in my specialty: no     Care discussed with: admitting provider       ____________________________________________   INITIAL IMPRESSION / ASSESSMENT AND PLAN / ED COURSE       70 year old male with past medical history of hypertension, hyperlipidemia, stroke, CKD, and intellectual disability who presents to the ED due to to altered mental status after being found to have a clogged suprapubic catheter for the past 2 days.  Catheter was replaced at urology office and is now draining purulent appearing urine.  Patient febrile and tachycardic concerning for sepsis, we will start broad-spectrum antibiotics and hydrate with IV fluids.  This is the likely cause of his altered mental status, additional  labs are pending but no focal neurologic deficits to suggest stroke.  UA confirms UTI and we will send for culture, patient to be treated with cefepime.  Heart rate is gradually improving following IV fluids and he remains normotensive.  Case discussed with hospitalist for admission.        ____________________________________________   FINAL  CLINICAL IMPRESSION(S) / ED DIAGNOSES  Final diagnoses:  Sepsis without acute organ dysfunction, due to unspecified organism ([place])  Altered mental status, unspecified altered mental status type  Complicated UTI (urinary tract infection)     ED Discharge Orders    None       Note:  This document was prepared using Dragon voice recognition software and may include unintentional dictation errors.   Blake Divine, MD 03/31/21 [phone]

## 2021-03-31 NOTE — ED Notes (Signed)
Informed RN bed assigned [id number]

## 2021-03-31 NOTE — Progress Notes (Signed)
03/31/2021 8:32 PM   Samuel Shelton Aug 13, 1951 [id number]  Referring provider: Bonnita Nasuti, MD [address] [place],  [place] [address]  Chief Complaint  Patient presents with  . Urinary Retention    HPI: Samuel Shelton is a 70 y.o. male who presented today for a non-draining SPT catheter.    His regular assistant is not with him today, but I communicated with her by phone.  Her name is Jonni Sanger and she is the Camera operator at [employer].  She states that they had noted his catheter had not been draining properly for the last 2 days.  He had been leaking around the suprapubic site.  She states they had sent a UA and a urine culture, but they have not started any antibiotics.  I noted that the patient was not as alert today during the visit as he had been in the past and appeared more lethargic.  UA nitrite positive, 30 WBC's, 11-30 RBC's and many bacteria.  PVR 800 cc of purulent urine.    PMH: Past Medical History:  Diagnosis Date  . Anemia   . Chronic mental illness   . Edema of both legs   . Elevated PSA   . Elevated PSA   . GERD (gastroesophageal reflux disease)   . Hypertension   . Mental retardation   . Phimosis     Surgical History: Past Surgical History:  Procedure Laterality Date  . IR CATHETER TUBE CHANGE  01/15/2021  . IR GUIDED DRAIN W CATHETER PLACEMENT  12/15/2020  . IR REPLC GASTRO/COLONIC TUBE PERCUT W/FLUORO  08/20/2020  . PEG PLACEMENT N/A 01/24/2020   Procedure: PERCUTANEOUS ENDOSCOPIC GASTROSTOMY (PEG) PLACEMENT;  Surgeon: [name], MD;  Location: [facility] END[facility];  Service: Endoscopy;  Laterality: N/A;  . PROSTATE BIOPSY N/A 06/22/2015   Procedure: PROSTATE BIOPSY;  Surgeon: Hollice Espy, MD;  Location: [facility];  Service: Urology;  Laterality: N/A;  . PROSTATE BIOPSY N/A 09/27/2016   Procedure: PROSTATE BIOPSY;  Surgeon: Hollice Espy, MD;  Location: [facility];  Service: Urology;  Laterality: N/A;  . RADIOLOGY WITH ANESTHESIA N/A  12/15/2020   Procedure: SUPERA PUBIC CATHETER PLACMENT in IR;  Surgeon: Suzette Battiest, MD;  Location: [place];  Service: Radiology;  Laterality: N/A;  . RADIOLOGY WITH ANESTHESIA N/A 01/15/2021   Procedure: IR WITH ANESTHESIA CATERTER TUB ECHANGE;  Surgeon: Radiologist, Medication, MD;  Location: [facility];  Service: Radiology;  Laterality: N/A;  . RADIOLOGY WITH ANESTHESIA N/A 02/04/2021   Procedure: IR WITH ANESTHESIA CATHETER TUBE CHANGE;  Surgeon: Radiologist, Medication, MD;  Location: [facility];  Service: Radiology;  Laterality: N/A;    Home Medications:  Allergies as of 03/31/2021   No Known Allergies     Medication List       Accurate as of March 31, 2021  2:50 PM. If you have any questions, ask your nurse or doctor.        acetaminophen 500 MG tablet Commonly known as: TYLENOL Take 1,000 mg by mouth in the morning and at bedtime.   amLODipine 5 MG tablet Commonly known as: NORVASC Take 1 tablet (5 mg total) by mouth daily. Start taking on: April 07, 2021   aspirin 325 MG tablet Take 1 tablet (325 mg total) by mouth daily.   cefpodoxime 200 MG tablet Commonly known as: VANTIN Take 1 tablet (200 mg total) by mouth every 12 (twelve) hours.   ferrous sulfate 325 (65 FE) MG tablet Take 325 mg by mouth daily  with breakfast.   finasteride 5 MG tablet Commonly known as: PROSCAR Take 1 tablet (5 mg total) by mouth daily.   melatonin 3 MG Tabs tablet Take 3 mg by mouth at bedtime.   pantoprazole sodium 40 mg/20 mL Pack Commonly known as: PROTONIX Take 40 mg by mouth daily.   sertraline 25 MG tablet Commonly known as: ZOLOFT Take 25 mg by mouth daily.   Vitamin D (Ergocalciferol) 1.25 MG (50000 UNIT) Caps capsule Commonly known as: DRISDOL Take 50,000 Units by mouth every Tuesday.       Allergies: No Known Allergies  Family History: Family History  Problem Relation Age of Onset  . Stroke Mother   . Hypertension Sister   . Diabetes type II Sister     Social  History:  reports that he has quit smoking. His smoking use included cigarettes. He smoked 0.50 packs per day. He has never used smokeless tobacco. He reports that he does not drink alcohol and does not use drugs.  ROS: Pertinent ROS in HPI  Physical Exam: Pulse (!) 120   Temp (!) 102.8 F (39.3 C) (Oral)   SpO2 95%   Constitutional:  Well nourished. Lethargic.   HEENT: [place] AT, mask in place.  Trachea midline Cardiovascular: No clubbing, cyanosis, or edema. Respiratory: Normal respiratory effort, no increased work of breathing. GU: Bladder palpable on exam.  SPT site is clean and dry.    Skin: Warm and clammy to touch.   Neurologic: Patient lethargic.  Responsive to loud stimuli.   Psychiatric: Flat affect and slumped over in chair.    Laboratory Data: Lab Results  Component Value Date   WBC 5.9 04/01/2021   HGB 11.7 (L) 04/01/2021   HCT 38.1 (L) 04/01/2021   MCV 89.4 04/01/2021   PLT 204 04/01/2021    Lab Results  Component Value Date   CREATININE 0.96 04/03/2021    Lab Results  Component Value Date   HGBA1C 5.3 08/04/2020    Lab Results  Component Value Date   TSH 1.749 01/08/2020       Component Value Date/Time   CHOL 92 08/04/2020 0130   HDL 47 08/04/2020 0130   CHOLHDL 2.0 08/04/2020 0130   VLDL 11 08/04/2020 0130   LDLCALC 34 08/04/2020 0130    Lab Results  Component Value Date   AST 27 04/01/2021   Lab Results  Component Value Date   ALT 24 04/01/2021    Urinalysis Component     Latest Ref Rng & Units 03/31/2021         1:55 PM  Specific Gravity, UA     1.005 - 1.030 1.010  pH, UA     5.0 - 7.5 >9.0 (H)  Color, UA     Yellow Straw  Appearance Ur     Clear Cloudy (A)  Leukocytes,UA     Negative 3+ (A)  Protein,UA     Negative/Trace 3+ (A)  Glucose, UA     NEGATIVE mg/dL Negative  Ketones, UA     Negative Negative  RBC, UA     Negative 3+ (A)  Bilirubin, UA     Negative Negative  Urobilinogen, Ur     0.2 - 1.0 mg/dL 0.2   Nitrite, UA     Negative Positive (A)  Microscopic Examination      See below:   Component     Latest Ref Rng & Units 03/31/2021         1:55 PM  WBC, UA  0 - 5 WBC/hpf >30 (A)  RBC     4.22 - 5.81 MIL/uL 11-30 (A)  Epithelial Cells (non renal)     0 - 10 /hpf 0-10  Crystals     N/A Present (A)  Crystal Type     N/A Triple Phosphate  Bacteria, UA     NONE SEEN Many (A)  I have reviewed the labs.   Suprapubic Cath Change Patient is present today for a suprapubic catheter change due to urinary retention.  9 ml of water was drained from the balloon, a 16 FR foley cath was removed from the tract with out difficulty.  Site was cleaned and prepped in a sterile fashion with betadine.  An 24 FR foley cath was replaced into the tract no complications were noted. Urine return was noted with 800 cc of grossly purulent urine.  10 ml of sterile water was inflated into the balloon and a night bag was placed.    Performed by: Myself and Evelina Bucy, CMA    Assessment & Plan:    1.  Sepsis/UTI -Urinalysis, Complete -CULTURE, URINE COMPREHENSIVE -I spoke with the facility and explained that when a SPT stops draining, we are to be notified immediately or they must take him to the ED.  I am very concerned that he may be septic at this time.  -patient was transferred to the ED for further evaluation and management -I have reached out to his niece and informed her of his condition and I have also placed a call with DSS   2. Neurogenic bladder -managed with SPT -exchanged today   Return for transferred to ED .  These notes generated with voice recognition software. I apologize for typographical errors.  Zara Council, PA-C  [facility] [address]  [place], [place] [address] [phone]  I spent 40 minutes on the day of the encounter to include pre-visit record review, face-to-face time with the patient, and post-visit ordering of  tests.

## 2021-03-31 NOTE — Telephone Encounter (Signed)
I s/w pt's niece today and got verbal consent for pt to be seen for clogged SPT Tube.  Appt was made today and niece didn't have enough time to get here.  [name], NIECE, [phone]

## 2021-03-31 NOTE — Consult Note (Signed)
Pharmacy Antibiotic Note  Samuel Shelton is a 70 y.o. male admitted on 03/31/2021 with sepsis due to UTI.  Pharmacy has been consulted for cefepime dosing.  Wt: 67 kg per RN.   Plan: Cefepime 2g q12H.   Height: 5\' 5"  (165.1 cm) IBW/kg (Calculated) : 61.5  Temp (24hrs), Avg:102 F (38.9 C), Min:101.2 F (38.4 C), Max:102.8 F (39.3 C)  Recent Labs  Lab 03/31/21 1438  WBC 9.0  CREATININE 1.45*  LATICACIDVEN 1.4    CrCl cannot be calculated (Unknown ideal weight.).    No Known Allergies  Antimicrobials this admission: 4/6 cefepime >>     Microbiology results: 4/6 BCx: pending 4/6 UCx: pending   Thank you for allowing pharmacy to be a part of this patient's care.  Oswald Hillock, PharmD, BCPS 03/31/2021 5:14 PM

## 2021-03-31 NOTE — Sepsis Progress Note (Signed)
elink monitoring code sepsis.  

## 2021-03-31 NOTE — H&P (Addendum)
History and Physical    Samuel Shelton VWU:[id number] DOB: 1951-12-05 DOA: 03/31/2021  PCP: Bonnita Nasuti, MD Patient coming from: SNF  I have personally briefly reviewed patient's old medical records in [place]  Chief Complaint: Altered mentation, urinary tract infection  HPI: Samuel Shelton is a 70 y.o. male with medical history significant of CVA with significant residual deficit, chronic developmental delay, GERD, hypertension who lives full-time at assisted living facility, [facility].  Note that time my evaluation patient is altered from baseline and is unable to provide any history.  Patient was brought to the emergency department today because nursing facility staff noted his suprapubic catheter to not be draining over the past 2 days and noted leaking around suprapubic insertion site.  UA and urine culture was sent from skilled nursing facility but not started on antibiotics.  Patient presented to urologist office where UA revealed positive nitrites, multiple WBCs, RBCs, bacteria.  When suprapubic catheter was exchanged 800 cc of grossly purulent urine were liberated and the patient was directed to the emergency room.  Apparently the patient at baseline says 1-2 words per his niece who was present at bedside at time of EDPs evaluation but had to leave before I was able to see patient.  On my evaluation the patient is resting in bed.  He appears chronically ill.  He is nonverbal and unable to provide any history.  Patient is hemodynamically stable at this moment but is febrile and mildly tachycardic.  No oxygen requirement, patient appears to be protecting his airway  ED Course: Upon arrival the patient was given fluid bolus, 2500 cc per sepsis protocol.  He was started empirically on cefepime.  Laboratory indices significant for hyponatremia, AKI.  Surprisingly lactic acid 1.4 with normal white blood cell count.  Review of Systems: As per HPI otherwise 10 point review of systems  negative.     Past Medical History:  Diagnosis Date  . Anemia   . Chronic mental illness   . Edema of both legs   . Elevated PSA   . Elevated PSA   . GERD (gastroesophageal reflux disease)   . Hypertension   . Mental retardation   . Phimosis     Past Surgical History:  Procedure Laterality Date  . IR CATHETER TUBE CHANGE  01/15/2021  . IR GUIDED DRAIN W CATHETER PLACEMENT  12/15/2020  . IR REPLC GASTRO/COLONIC TUBE PERCUT W/FLUORO  08/20/2020  . PEG PLACEMENT N/A 01/24/2020   Procedure: PERCUTANEOUS ENDOSCOPIC GASTROSTOMY (PEG) PLACEMENT;  Surgeon: [name], MD;  Location: [facility];  Service: Endoscopy;  Laterality: N/A;  . PROSTATE BIOPSY N/A 06/22/2015   Procedure: PROSTATE BIOPSY;  Surgeon: Hollice Espy, MD;  Location: [facility] ORS;  Service: Urology;  Laterality: N/A;  . PROSTATE BIOPSY N/A 09/27/2016   Procedure: PROSTATE BIOPSY;  Surgeon: Hollice Espy, MD;  Location: [facility] ORS;  Service: Urology;  Laterality: N/A;  . RADIOLOGY WITH ANESTHESIA N/A 12/15/2020   Procedure: SUPERA PUBIC CATHETER PLACMENT in IR;  Surgeon: Suzette Battiest, MD;  Location: [place];  Service: Radiology;  Laterality: N/A;  . RADIOLOGY WITH ANESTHESIA N/A 01/15/2021   Procedure: IR WITH ANESTHESIA CATERTER TUB ECHANGE;  Surgeon: Radiologist, Medication, MD;  Location: [place];  Service: Radiology;  Laterality: N/A;  . RADIOLOGY WITH ANESTHESIA N/A 02/04/2021   Procedure: IR WITH ANESTHESIA CATHETER TUBE CHANGE;  Surgeon: Radiologist, Medication, MD;  Location: [place];  Service: Radiology;  Laterality: N/A;     reports that he has quit  smoking. His smoking use included cigarettes. He smoked 0.50 packs per day. He has never used smokeless tobacco. He reports that he does not drink alcohol and does not use drugs.  No Known Allergies  Family History  Problem Relation Age of Onset  . Stroke Mother   . Hypertension Sister   . Diabetes type II Sister      Prior to Admission medications   Medication  Sig Start Date End Date Taking? Authorizing Provider  acetaminophen (TYLENOL) 500 MG tablet Take 1,000 mg by mouth in the morning and at bedtime.    [provider]  aspirin 325 MG tablet Take 1 tablet (325 mg total) by mouth daily. 08/08/20   Terrilee Croak, MD  ferrous sulfate 325 (65 FE) MG tablet Take 325 mg by mouth daily with breakfast.    [provider]  finasteride (PROSCAR) 5 MG tablet Take 1 tablet (5 mg total) by mouth daily. 09/05/18   Hollice Espy, MD  melatonin 3 MG TABS tablet Take 3 mg by mouth at bedtime.    [provider]  pantoprazole sodium (PROTONIX) 40 mg/20 mL PACK Take 40 mg by mouth daily.    [provider]  Vitamin D, Ergocalciferol, (DRISDOL) 1.25 MG (50000 UNIT) CAPS capsule Take 50,000 Units by mouth every Tuesday.    [provider]    Physical Exam: Vitals:   03/31/21 1500 03/31/21 1530 03/31/21 1600 03/31/21 1630  BP: 114/71 124/76 114/70 114/67  Pulse: (!) 115 (!) 116 (!) 111 (!) 109  Resp: (!) 22 16 (!) 23 (!) 21  Temp:      TempSrc:      SpO2: 96% 99% 99% 95%  Height:         Vitals:   03/31/21 1500 03/31/21 1530 03/31/21 1600 03/31/21 1630  BP: 114/71 124/76 114/70 114/67  Pulse: (!) 115 (!) 116 (!) 111 (!) 109  Resp: (!) 22 16 (!) 23 (!) 21  Temp:      TempSrc:      SpO2: 96% 99% 99% 95%  Height:       Constitutional: Appears chronically ill Eyes: Pupils not assessed, lids and conjunctiva pale ENMT: Mucous membranes are dry. Posterior pharynx clear of any exudate or lesions.poor dentition.  Neck: normal, supple, no masses, no thyromegaly Respiratory: Lungs clear, normal work of breathing, room air Cardiovascular: Tachycardic, regular rhythm, no murmurs, no pedal edema Abdomen: Mild TTP suprapubic region, normal bowel sounds, nondistended Musculoskeletal: Contractures bilateral upper extremities, decreased muscle tone diffusely Skin: Scattered excoriations, no obvious ulcers or  induration Neurologic: Cranial nerves not able to be assessed, strength markedly decreased  psychiatric: Normal judgment and impaired. Alert and oriented x 0.  Flattened mood.    Labs on Admission: I have personally reviewed following labs and imaging studies  CBC: Recent Labs  Lab 03/31/21 1438  WBC 9.0  NEUTROABS 8.1*  HGB 12.8*  HCT 41.3  MCV 88.4  PLT 376   Basic Metabolic Panel: Recent Labs  Lab 03/31/21 1438  NA 151*  K 4.0  CL 116*  CO2 24  GLUCOSE 107*  BUN 48*  CREATININE 1.45*  CALCIUM 9.7   GFR: CrCl cannot be calculated (Unknown ideal weight.). Liver Function Tests: Recent Labs  Lab 03/31/21 1438  AST 25  ALT 24  ALKPHOS 53  BILITOT 1.8*  PROT 8.2*  ALBUMIN 3.7   No results for input(s): LIPASE, AMYLASE in the last 168 hours. No results for input(s): AMMONIA in the last 168  hours. Coagulation Profile: Recent Labs  Lab 03/31/21 1438  INR 1.2   Cardiac Enzymes: No results for input(s): CKTOTAL, CKMB, CKMBINDEX, TROPONINI in the last 168 hours. BNP (last 3 results) No results for input(s): PROBNP in the last 8760 hours. HbA1C: No results for input(s): HGBA1C in the last 72 hours. CBG: No results for input(s): GLUCAP in the last 168 hours. Lipid Profile: No results for input(s): CHOL, HDL, LDLCALC, TRIG, CHOLHDL, LDLDIRECT in the last 72 hours. Thyroid Function Tests: No results for input(s): TSH, T4TOTAL, FREET4, T3FREE, THYROIDAB in the last 72 hours. Anemia Panel: No results for input(s): VITAMINB12, FOLATE, FERRITIN, TIBC, IRON, RETICCTPCT in the last 72 hours. Urine analysis:    Component Value Date/Time   COLORURINE YELLOW (A) 03/31/2021 1521   APPEARANCEUR TURBID (A) 03/31/2021 1521   APPEARANCEUR Hazy (A) 09/04/2019 1051   LABSPEC 1.024 03/31/2021 1521   PHURINE 6.0 03/31/2021 1521   GLUCOSEU NEGATIVE 03/31/2021 1521   HGBUR LARGE (A) 03/31/2021 1521   BILIRUBINUR NEGATIVE 03/31/2021 1521   BILIRUBINUR Negative 09/04/2019  1051   KETONESUR NEGATIVE 03/31/2021 1521   PROTEINUR 100 (A) 03/31/2021 1521   NITRITE POSITIVE (A) 03/31/2021 1521   LEUKOCYTESUR MODERATE (A) 03/31/2021 1521    Radiological Exams on Admission: DG Chest Port 1 View  Result Date: 03/31/2021 CLINICAL DATA:  Sepsis, purulent drainage from suprapubic catheter EXAM: PORTABLE CHEST 1 VIEW COMPARISON:  Portable exam 1523 hours compared to 08/03/2020 FINDINGS: Normal heart size, mediastinal contours, and pulmonary vascularity. Tortuosity of thoracic aorta noted. Lungs clear. No infiltrate, pleural effusion, or pneumothorax. Bones demineralized. IMPRESSION: No acute abnormalities. Electronically Signed   By: Lavonia Dana M.D.   On: 03/31/2021 15:54    EKG: Independently reviewed.  Sinus tachycardia  Assessment/Plan Active Problems:   Severe sepsis with acute organ dysfunction (HCC)  Catheter associated UTI, POA Severe sepsis secondary to above Acute metabolic encephalopathy secondary to above Baseline mental status is unclear but likely poor Sepsis criteria met with AKI, encephalopathy, tachycardia, fever Presumed source of infection is urine Plan: Fluid bolus per sepsis protocol, received 2500 cc total Maintenance fluids D5 half-normal saline 100 cc/h Cefepime, pharmacy dosing appreciated Monitor vitals and fever curve Frequent neuro checks Monitor culture N.p.o. SLP consult  History of CVA with residual deficit History of suprapubic catheter Baseline status is unclear Suspect that patient may approach baseline with 24 to 48 hours of IV antibiotics Suprapubic catheter changed 4/6 in urology office Plan: Continue suprapubic catheter We will consult urology if any difficulties arise  Acute kidney injury Hypernatremia AKI are likely related to sepsis Hypernatremia also related to sepsis/underlying dehydration Plan: Status post LR 2500 cc bolus Start D5 half-normal saline 100 cc/h Daily BMP  Essential hypertension Home  agents on hold  Chronic intellectual disability No acute issues  Hyperlipidemia Home statin on hold  Chronic debility On chart review it appears that patient was previously DNR but subsequently changed to full code We will continue full code for now though the patient's is most appropriate for DNR We will consult palliative care for goals of care At the least patient may benefit from outpatient palliative follow-up  DVT prophylaxis: SQ heparin Code Status: Full Family Communication: Attempted to call emergency contact patient's niece Jolayne Haines at [phone].  Phone number did not appear to be in service.  No family members at bedside at time of my evaluation. Disposition Plan: Anticipate return to previous home environment, SNF  consults called: Palliative care Admission status: Inpatient, PCU  Sidney Ace MD [facility]  If 7PM-7AM, please contact night-coverage   03/31/2021, 4:59 PM

## 2021-03-31 NOTE — ED Triage Notes (Signed)
Pt to ER from [facility]. Per facility staff that accompany patient, pt has suprapubic catheter that had not been draining for two days. Today it was changed and it is now draining purulent discharge. Staff report patient is decompensating and becoming more lethargic, temp was 102 oral and Hr was 102 at appointment.

## 2021-03-31 NOTE — Consult Note (Signed)
CODE SEPSIS - PHARMACY COMMUNICATION  **Broad Spectrum Antibiotics should be administered within 1 hour of Sepsis diagnosis**  Time Code Sepsis Called/Page Received: 1520  Antibiotics Ordered: cefepime  Time of 1st antibiotic administration: [place] ,PharmD Clinical Pharmacist  03/31/2021  3:41 PM

## 2021-04-01 ENCOUNTER — Inpatient Hospital Stay: Payer: Medicare Other

## 2021-04-01 DIAGNOSIS — Z7189 Other specified counseling: Secondary | ICD-10-CM

## 2021-04-01 DIAGNOSIS — Z515 Encounter for palliative care: Secondary | ICD-10-CM

## 2021-04-01 DIAGNOSIS — Z66 Do not resuscitate: Secondary | ICD-10-CM

## 2021-04-01 LAB — BLOOD CULTURE ID PANEL (REFLEXED) - BCID2

## 2021-04-01 LAB — MICROSCOPIC EXAMINATION: WBC, UA: >30 /hpf — AB (ref 0–5)

## 2021-04-01 LAB — URINALYSIS, COMPLETE
Bilirubin, UA: NEGATIVE
Glucose, UA: NEGATIVE
Ketones, UA: NEGATIVE
Nitrite, UA: POSITIVE — AB
Specific Gravity, UA: 1.01 (ref 1.005–1.030)
Urobilinogen, Ur: 0.2 mg/dL (ref 0.2–1.0)
pH, UA: >9 — ABNORMAL HIGH (ref 5.0–7.5)

## 2021-04-01 LAB — CBC
HCT: 38.1 % — ABNORMAL LOW (ref 39.0–52.0)
Hemoglobin: 11.7 g/dL — ABNORMAL LOW (ref 13.0–17.0)
MCH: 27.5 pg (ref 26.0–34.0)
MCHC: 30.7 g/dL (ref 30.0–36.0)
MCV: 89.4 fL (ref 80.0–100.0)
Platelets: 204 10*3/uL (ref 150–400)
RBC: 4.26 MIL/uL (ref 4.22–5.81)
RDW: 17.2 % — ABNORMAL HIGH (ref 11.5–15.5)
WBC: 5.9 10*3/uL (ref 4.0–10.5)
nRBC: 0 % (ref 0.0–0.2)

## 2021-04-01 LAB — PROTIME-INR
INR: 1.3 — ABNORMAL HIGH (ref 0.8–1.2)
Prothrombin Time: 15.9 seconds — ABNORMAL HIGH (ref 11.4–15.2)

## 2021-04-01 LAB — COMPREHENSIVE METABOLIC PANEL
ALT: 24 U/L (ref 0–44)
AST: 27 U/L (ref 15–41)
Albumin: 3.2 g/dL — ABNORMAL LOW (ref 3.5–5.0)
Alkaline Phosphatase: 47 U/L (ref 38–126)
Anion gap: 9 (ref 5–15)
BUN: 31 mg/dL — ABNORMAL HIGH (ref 8–23)
CO2: 25 mmol/L (ref 22–32)
Calcium: 9 mg/dL (ref 8.9–10.3)
Chloride: 116 mmol/L — ABNORMAL HIGH (ref 98–111)
Creatinine, Ser: 1 mg/dL (ref 0.61–1.24)
GFR, Estimated: >60 mL/min (ref >60)
Glucose, Bld: 122 mg/dL — ABNORMAL HIGH (ref 70–99)
Potassium: 3.5 mmol/L (ref 3.5–5.1)
Sodium: 150 mmol/L — ABNORMAL HIGH (ref 135–145)
Total Bilirubin: 1 mg/dL (ref 0.3–1.2)
Total Protein: 7.1 g/dL (ref 6.5–8.1)

## 2021-04-01 LAB — PROCALCITONIN: Procalcitonin: 1.66 ng/mL

## 2021-04-01 MED ORDER — ENOXAPARIN SODIUM 40 MG/0.4ML ~~LOC~~ SOLN
40.0000 mg | SUBCUTANEOUS | Status: DC
  Administered 2021-04-01 – 2021-04-05 (×5): 40 mg via SUBCUTANEOUS
  Filled 2021-04-01 (×5): qty 0.4

## 2021-04-01 MED ORDER — SODIUM CHLORIDE 0.9 % IV SOLN
2.0000 g | INTRAVENOUS | Status: DC
  Administered 2021-04-01 – 2021-04-04 (×4): 2 g via INTRAVENOUS
  Filled 2021-04-01: qty 20
  Filled 2021-04-01: qty 2
  Filled 2021-04-01 (×2): qty 20

## 2021-04-01 MED ORDER — IOHEXOL 300 MG/ML  SOLN
100.0000 mL | Freq: Once | INTRAMUSCULAR | Status: AC | PRN
  Administered 2021-04-01: 100 mL via INTRAVENOUS

## 2021-04-01 NOTE — Progress Notes (Signed)
   04/01/21 1245  Clinical Encounter Type  Visited With Patient and family together  Visit Type Initial  Referral From Nurse  Consult/Referral To Chaplain  Spiritual Encounters  Spiritual Needs Brochure  Chaplain Lavonna Lampron visited Pt in [id number], Pt Samuel Shelton. Per one of the nurses Pt's daughter Lulu Riding was trying to locate an AD from 6 years ago. Chaplains called admissions and they were finally able to locate the AD on file.

## 2021-04-01 NOTE — Progress Notes (Signed)
Icepacks applied to bilateral axillary regions and groin.

## 2021-04-01 NOTE — Consult Note (Signed)
Consultation Note Date: 04/01/2021   Patient Name: Samuel Shelton  DOB: 1951-04-03  MRN: [id number]  Age / Sex: 70 y.o., male  PCP: Bonnita Nasuti, MD Referring Physician: Sidney Ace, MD  Reason for Consultation: Establishing goals of care  HPI/Patient Profile: 70 y.o. male  with past medical history of CVA with significant residual deficit, chronic developmental delay, GERD, HTN, and HLD admitted on 03/31/2021 from urologist office d/t grossly purulent urine and UA positive for nitrites, mult WBCs, RBCs, and bacteria. Being treated for catheter associated UTI and severe sepsis. PMT consulted to discuss [place].   Clinical Assessment and Goals of Care: I have reviewed medical records including EPIC notes, labs and imaging, assessed the patient and then met with his niece and HCPOA, [name], to discuss diagnosis prognosis, GOC, EOL wishes, disposition and options.  I introduced Palliative Medicine as specialized medical care for people living with serious illness. It focuses on providing relief from the symptoms and stress of a serious illness. The goal is to improve quality of life for both the patient and the family.  [name] and I met in 2021 to discuss the care of Mr. Zee - at that time the desire was for full code/fulls cope interventions. [name] remembers our discussions.   We discussed a brief life review of the patient. [name] tells me of Rahkeem's chronic developmental delay. Tells me he was able to work some jobs but could not drive. Minimally verbal. His mother was his caregiver until her death then Ottis's sister cared for him. She is no longer able and now [name] serves as his caregiver.   As far as functional and nutritional status, [name] tells me of a decline. Tells me he has been non ambulatory for several months. She tells me of weight loss. She attributes most of his decline to the care he was receiving at his  long term care facility.    We discussed patient's current illness and what it means in the larger context of patient's on-going co-morbidities. Discussed sepsis/UTI and treatment with antibiotics. Discussed Elber's chronic decline - loss of function, cognition, and poor nutrition.   I attempted to elicit values and goals of care important to the patient. The difference between aggressive medical intervention and comfort care was considered in light of the patient's goals of care.Advance directives, concept specific to code status, artificial feeding and hydration, and rehospitalization were considered and discussed.  I completed a MOST form today. The patient and family outlined their wishes for the following treatment decisions:  Cardiopulmonary Resuscitation: Do Not Attempt Resuscitation (DNR/No CPR)  Medical Interventions: Limited Additional Interventions: Use medical treatment, IV fluids and cardiac monitoring as indicated, DO NOT USE intubation or mechanical ventilation. May consider use of less invasive airway support such as BiPAP or CPAP. Also provide comfort measures. Transfer to the hospital if indicated. Avoid intensive care.   Antibiotics: Antibiotics if indicated  IV Fluids: IV fluids if indicated  Feeding Tube: Feeding tube long-term if indicated    Discussed with [name] the importance of continued conversation with family and the medical providers regarding overall plan of care and treatment options, ensuring decisions are within the context of the patient's values and GOCs.    We also located papers signed by Gwenlyn Perking many years ago that named [name] as his HCPOA - I have sent these to be scanned into Epic along with MOST form and DNR form.   [name] is requesting change in LTC facility - asks if he can  go to [facility], will inform TOC team.   Questions and concerns were addressed. The family was encouraged to call with questions or concerns.   Primary Decision Maker HCPOA -  niece [name]   SUMMARY OF RECOMMENDATIONS   - DNR has been established, MOST completed as above - limited medical interventions, avoid ICU - [name] has HCPOA documentation - DNR, HCPOA document, and MOST form sent to be scanned into Epic - PMT will follow along - would likely benefit from OP palliative  Code Status/Advance Care Planning:  DNR  Discharge Planning: [facility] for rehab with Palliative care service follow-up      Primary Diagnoses: Present on Admission: . Severe sepsis with acute organ dysfunction ([place])   I have reviewed the medical record, interviewed the patient and family, and examined the patient. The following aspects are pertinent.  Past Medical History:  Diagnosis Date  . Anemia   . Chronic mental illness   . Edema of both legs   . Elevated PSA   . Elevated PSA   . GERD (gastroesophageal reflux disease)   . Hypertension   . Mental retardation   . Phimosis    Social History   Socioeconomic History  . Marital status: Single    Spouse name: Not on file  . Number of children: Not on file  . Years of education: Not on file  . Highest education level: Not on file  Occupational History  . Not on file  Tobacco Use  . Smoking status: Former Smoker    Packs/day: 0.50    Types: Cigarettes  . Smokeless tobacco: Never Used  Vaping Use  . Vaping Use: Never used  Substance and Sexual Activity  . Alcohol use: No    Alcohol/week: 0.0 standard drinks  . Drug use: No  . Sexual activity: Not on file  Other Topics Concern  . Not on file  Social History Narrative  . Not on file   Social Determinants of Health   Financial Resource Strain: Not on file  Food Insecurity: Not on file  Transportation Needs: Not on file  Physical Activity: Not on file  Stress: Not on file  Social Connections: Not on file   Family History  Problem Relation Age of Onset  . Stroke Mother   . Hypertension Sister   . Diabetes type II Sister     Scheduled Meds: . heparin  5,000 Units Subcutaneous Q8H   Continuous Infusions: . cefTRIAXone (ROCEPHIN)  IV 2 g (04/01/21 0526)  . dextrose 5 % and 0.45% NaCl 125 mL/hr at 04/01/21 0002   PRN Meds:.acetaminophen **OR** acetaminophen, albuterol, ondansetron **OR** ondansetron (ZOFRAN) IV No Known Allergies Review of Systems  Unable to perform ROS: Mental status change    Physical Exam Constitutional:      Comments: Opens eyes but not interactive - does not respond to voice or touch Appears frail, temporal wasting  Pulmonary:     Effort: Pulmonary effort is normal.  Skin:    General: Skin is warm and dry.     Vital Signs: BP 114/79 (BP Location: Left Arm)   Pulse (!) 103   Temp (!) 101.8 F (38.8 C) (Axillary)   Resp 17   Ht 5' 5" (1.651 m)   Wt 67.3 kg   SpO2 97%   BMI 24.69 kg/m  Pain Scale: Faces       SpO2: SpO2: 97 % O2 Device:SpO2: 97 % O2 Flow Rate: .   IO: Intake/output summary:  Intake/Output Summary (Last 24 hours) at 04/01/2021 1356 Last data filed at 04/01/2021 1300 Gross per 24 hour  Intake 0 ml  Output 1200 ml  Net -1200 ml    LBM:   Baseline Weight: Weight: 67.3 kg Most recent weight: Weight: 67.3 kg     Palliative Assessment/Data: PPS 20%    Time Total: 60 minutes Greater than 50%  of this time was spent counseling and coordinating care related to the above assessment and plan.  Juel Burrow, DNP, AGNP-C Palliative Medicine Team [phone] Pager: [phone]

## 2021-04-01 NOTE — Progress Notes (Signed)
   04/01/21 1227  Assess: MEWS Score  Temp (!) 102.5 F (39.2 C)  BP 121/76  Pulse Rate (!) 108  Resp 14  Level of Consciousness Responds to Voice  SpO2 97 %  O2 Device Room Air  Assess: MEWS Score  MEWS Temp 2  MEWS Systolic 0  MEWS Pulse 1  MEWS RR 0  MEWS LOC 1  MEWS Score 4  MEWS Score Color Red  Assess: if the MEWS score is Yellow or Red  Were vital signs taken at a resting state? Yes  Focused Assessment No change from prior assessment  Early Detection of Sepsis Score *See Row Information* High  MEWS guidelines implemented *See Row Information* Yes  Treat  MEWS Interventions Escalated (See documentation below)  Pain Scale Faces  Faces Pain Scale 0  Take Vital Signs  Increase Vital Sign Frequency  Red: Q 1hr X 4 then Q 4hr X 4, if remains red, continue Q 4hrs  Escalate  MEWS: Escalate Red: discuss with charge nurse/RN and provider, consider discussing with RRT  Notify: Charge Nurse/RN  Name of Charge Nurse/RN Notified Ria Comment  Date Charge Nurse/RN Notified 04/01/21  Time Charge Nurse/RN Notified 1255  Notify: Provider  Provider Name/Title Ralene Muskrat  Date Provider Notified 04/01/21  Time Provider Notified 1254  Notification Type Page  Notification Reason Other (Comment) (Red MEWs)  Provider response No new orders  Date of Provider Response 04/01/21  Time of Provider Response 1255  Notify: Rapid Response  Name of Rapid Response RN Notified Sarah  Date Rapid Response Notified 04/01/21  Time Rapid Response Notified 1254  Document  Patient Outcome Other (Comment) (Pt remains the same)  Progress note created (see row info) Yes

## 2021-04-01 NOTE — Plan of Care (Signed)
  Problem: Education: Goal: Knowledge of General Education information will improve Description: Including pain rating scale, medication(s)/side effects and non-pharmacologic comfort measures 04/01/2021 1424 by Cristela Blue, RN Outcome: Progressing 04/01/2021 1424 by Cristela Blue, RN Outcome: Progressing   Problem: Health Behavior/Discharge Planning: Goal: Ability to manage health-related needs will improve 04/01/2021 1424 by Cristela Blue, RN Outcome: Progressing 04/01/2021 1424 by Cristela Blue, RN Outcome: Progressing   Problem: Clinical Measurements: Goal: Ability to maintain clinical measurements within normal limits will improve 04/01/2021 1424 by Cristela Blue, RN Outcome: Progressing 04/01/2021 1424 by Cristela Blue, RN Outcome: Progressing Goal: Will remain free from infection 04/01/2021 1424 by Cristela Blue, RN Outcome: Progressing 04/01/2021 1424 by Cristela Blue, RN Outcome: Progressing Goal: Diagnostic test results will improve 04/01/2021 1424 by Cristela Blue, RN Outcome: Progressing 04/01/2021 1424 by Cristela Blue, RN Outcome: Progressing Goal: Respiratory complications will improve 04/01/2021 1424 by Cristela Blue, RN Outcome: Progressing 04/01/2021 1424 by Cristela Blue, RN Outcome: Progressing Goal: Cardiovascular complication will be avoided 04/01/2021 1424 by Cristela Blue, RN Outcome: Progressing 04/01/2021 1424 by Cristela Blue, RN Outcome: Progressing   Problem: Activity: Goal: Risk for activity intolerance will decrease 04/01/2021 1424 by Cristela Blue, RN Outcome: Progressing 04/01/2021 1424 by Cristela Blue, RN Outcome: Progressing   Problem: Nutrition: Goal: Adequate nutrition will be maintained 04/01/2021 1424 by Cristela Blue, RN Outcome: Progressing 04/01/2021 1424 by Cristela Blue, RN Outcome: Progressing   Problem: Coping: Goal: Level of anxiety will decrease 04/01/2021 1424 by Cristela Blue, RN Outcome: Progressing 04/01/2021 1424 by  Cristela Blue, RN Outcome: Progressing   Problem: Elimination: Goal: Will not experience complications related to bowel motility 04/01/2021 1424 by Cristela Blue, RN Outcome: Progressing 04/01/2021 1424 by Cristela Blue, RN Outcome: Progressing Goal: Will not experience complications related to urinary retention 04/01/2021 1424 by Cristela Blue, RN Outcome: Progressing 04/01/2021 1424 by Cristela Blue, RN Outcome: Progressing   Problem: Pain Managment: Goal: General experience of comfort will improve 04/01/2021 1424 by Cristela Blue, RN Outcome: Progressing 04/01/2021 1424 by Cristela Blue, RN Outcome: Progressing   Problem: Safety: Goal: Ability to remain free from injury will improve 04/01/2021 1424 by Cristela Blue, RN Outcome: Progressing 04/01/2021 1424 by Cristela Blue, RN Outcome: Progressing   Problem: Skin Integrity: Goal: Risk for impaired skin integrity will decrease 04/01/2021 1424 by Cristela Blue, RN Outcome: Progressing 04/01/2021 1424 by Cristela Blue, RN Outcome: Progressing

## 2021-04-01 NOTE — Progress Notes (Signed)
Cross Cover Patient continues to have borderline blood pressures and tachycardia.  Only mild improvement in fever after acetaminophen 250 cc fluid bolus and increased maintenance fluids to 125 cc/h

## 2021-04-01 NOTE — Progress Notes (Signed)
   04/01/21 0830  Assess: MEWS Score  Temp 99.1 F (37.3 C)  BP 109/87  Pulse Rate (!) 103  Resp 16  Level of Consciousness Responds to Voice  SpO2 100 %  O2 Device Room Air  Assess: MEWS Score  MEWS Temp 0  MEWS Systolic 0  MEWS Pulse 1  MEWS RR 0  MEWS LOC 1  MEWS Score 2  MEWS Score Color Yellow  Assess: if the MEWS score is Yellow or Red  Were vital signs taken at a resting state? Yes  Focused Assessment No change from prior assessment  Early Detection of Sepsis Score *See Row Information* Medium  MEWS guidelines implemented *See Row Information* Yes  Treat  MEWS Interventions Escalated (See documentation below)  Pain Scale Faces  Faces Pain Scale 0  Take Vital Signs  Increase Vital Sign Frequency  Yellow: Q 2hr X 2 then Q 4hr X 2, if remains yellow, continue Q 4hrs  Escalate  MEWS: Escalate Yellow: discuss with charge nurse/RN and consider discussing with provider and RRT  Notify: Charge Nurse/RN  Name of Charge Nurse/RN Notified Ria Comment  Date Charge Nurse/RN Notified 04/01/21  Time Charge Nurse/RN Notified 0849  Document  Patient Outcome Other (Comment) (Pt stable)  Progress note created (see row info) Yes

## 2021-04-01 NOTE — Progress Notes (Signed)
SLP Cancellation Note  Patient Details Name: Samuel Shelton MRN: [id number] DOB: [birth year]   Cancelled treatment:       Reason Eval/Treat Not Completed: Patient not medically ready;Fatigue/lethargy limiting ability to participate;Medical issues which prohibited therapy;Patient's level of consciousness  Pt unable to arouse despite maximal efforts. As such NPO continues to be appropriate.   Samuel Shelton B. Rutherford Nail M.S., CCC-SLP, [place] Office [phone]  Samuel Shelton 04/01/2021, 7:48 PM

## 2021-04-01 NOTE — Progress Notes (Signed)
PHARMACY - PHYSICIAN COMMUNICATION CRITICAL VALUE ALERT - BLOOD CULTURE IDENTIFICATION (BCID)  2 (both anaerobic) of 4 bottles with Enterobacterales Proteus Species. No resistance genes. Pt currently on Cefepime 2gm q12h for UTI.  Name of physician (or Provider) Contacted: Sharion Settler, NP  Changes to prescribed antibiotics required: Transtition to Ceftriaxone 2 gm q24hr.  Renda Rolls, PharmD, [facility] 04/01/2021 4:58 AM

## 2021-04-01 NOTE — Progress Notes (Addendum)
PROGRESS NOTE    Samuel Shelton  ZOX:[id number] DOB: [birth year] DOA: 03/31/2021 PCP: Bonnita Nasuti, MD   Brief Narrative:   70 y.o. male with medical history significant of CVA with significant residual deficit, chronic developmental delay, GERD, hypertension who lives full-time at assisted living facility, [facility].  Note that time my evaluation patient is altered from baseline and is unable to provide any history.  Patient was brought to the emergency department today because nursing facility staff noted his suprapubic catheter to not be draining over the past 2 days and noted leaking around suprapubic insertion site.  UA and urine culture was sent from skilled nursing facility but not started on antibiotics.  Patient presented to urologist office where UA revealed positive nitrites, multiple WBCs, RBCs, bacteria.  When suprapubic catheter was exchanged 800 cc of grossly purulent urine were liberated and the patient was directed to the emergency room.  4/7: Patient remained encephalopathic overnight.  Persistent fevers.  Blood culture positive for Enterobacter and Proteus species.  Antibiotics switched to Rocephin per infectious disease pharmacist recommendations.  Overall remains hemodynamically stable, no white blood cell count elevation.  Lengthy discussion with the patient's niece at bedside.  Discussed chronic illness and debility.  CODE STATUS switch to DO NOT RESUSCITATE.   Assessment & Plan:   Active Problems:   Severe sepsis with acute organ dysfunction (HCC)  Catheter associated UTI, POA Severe sepsis secondary to above Acute metabolic encephalopathy secondary to above Gram-negative bacteremia Baseline mental status is unclear but likely poor Sepsis criteria met with AKI, encephalopathy, tachycardia, fever Presumed source of infection is urine Persistent fevers CT abdomen pelvis: Pyelonephritis without abscess Plan: Continue maintenance fluids D5 half-normal saline 100  cc/h Continue Rocephin, pharmacy dosing appreciated Check echocardiogram given persistent fevers Monitor vitals and fever curve Frequent neuro checks Monitor culture data SLP consult, recommending n.p.o. status  History of CVA with residual deficit History of suprapubic catheter Baseline status is unclear Suspect that patient may approach baseline with 24 to 48 hours of IV antibiotics Suprapubic catheter changed 4/6 in urology office Plan: Continue suprapubic catheter We will consult urology if any difficulties arise  Acute kidney injury, resolved Hypernatremia AKI are likely related to sepsis Hypernatremia also related to sepsis/underlying dehydration AKI resolved Plan: Status post LR 2500 cc bolus Continue D5 half-normal saline 100 cc/h Daily BMP  Essential hypertension Home agents on hold  Chronic intellectual disability No acute issues  Hyperlipidemia Home statin on hold  Chronic debility Patient has numerous CODE STATUS is in chart.  I had a discussion with his niece at bedside today.  CODE STATUS switched to DNR.  Palliative care also consulted and MOST form completed. Recommend outpatient palliative follow-up at time of discharge   DVT prophylaxis: SQ Lovenox Code Status: DNR Family Communication: Niece at bedside Disposition Plan: Status is: Inpatient  Remains inpatient appropriate because:Inpatient level of care appropriate due to severity of illness   Dispo: The patient is from: SNF              Anticipated d/c is to: SNF              Patient currently is not medically stable to d/c.   Difficult to place patient No   Sepsis, gram-negative bacteremia, complicated UTI associated with catheter.  Improving slowly.  Anticipate disposition in 2 to 3 days.       Level of care: Progressive Cardiac  Consultants:   Palliative care  Procedures:   None  Antimicrobials:  Ceftriaxone   Subjective: Patient seen and examined.  Remains  encephalopathic and unable to provide history.  Niece at bedside states that he looks a little better today  Objective: Vitals:   04/01/21 1227 04/01/21 1330 04/01/21 1444 04/01/21 1525  BP: 121/76 114/79 (!) 123/97 108/76  Pulse: (!) 108 (!) 103 (!) 106 100  Resp: _0 Temp: (!) 102.5 F (39.2 C) (!) 101.8 F (38.8 C) (!) 102.9 F (39.4 C) (!) 101.6 F (38.7 C)  TempSrc: Axillary Axillary Axillary Axillary  SpO2: 97%  98% 100%  Weight:      Height:        Intake/Output Summary (Last 24 hours) at 04/01/2021 1558 Last data filed at 04/01/2021 1300 Gross per 24 hour  Intake 0 ml  Output 1200 ml  Net -1200 ml   Filed Weights   03/31/21 2002  Weight: 67.3 kg    Examination:  General exam: No acute distress, chronically ill Respiratory system: Lungs clear.  Normal work of breathing.  Room air Cardiovascular system: S1-S2, regular rate and rhythm, no murmurs Gastrointestinal system: Soft, nontender, nondistended, normal bowel sounds Central nervous system: Post CVA residual deficits, oriented x0 Extremities: Symmetrically decreased power bilateral upper and lower extremities Skin: Scattered excoriations Psychiatry: Not able to be assessed    Data Reviewed: I have personally reviewed following labs and imaging studies  CBC: Recent Labs  Lab 03/31/21 1438 04/01/21 0540  WBC 9.0 5.9  NEUTROABS 8.1*  --   HGB 12.8* 11.7*  HCT 41.3 38.1*  MCV 88.4 89.4  PLT 266 629   Basic Metabolic Panel: Recent Labs  Lab 03/31/21 1438 04/01/21 0540  NA 151* 150*  K 4.0 3.5  CL 116* 116*  CO2 24 25  GLUCOSE 107* 122*  BUN 48* 31*  CREATININE 1.45* 1.00  CALCIUM 9.7 9.0   GFR: Estimated Creatinine Clearance: 58.1 mL/min (by C-G formula based on SCr of 1 mg/dL). Liver Function Tests: Recent Labs  Lab 03/31/21 1438 04/01/21 0540  AST 25 27  ALT 24 24  ALKPHOS 53 47  BILITOT 1.8* 1.0  PROT 8.2* 7.1  ALBUMIN 3.7 3.2*   No results for input(s): LIPASE, AMYLASE  in the last 168 hours. No results for input(s): AMMONIA in the last 168 hours. Coagulation Profile: Recent Labs  Lab 03/31/21 1438 04/01/21 0540  INR 1.2 1.3*   Cardiac Enzymes: No results for input(s): CKTOTAL, CKMB, CKMBINDEX, TROPONINI in the last 168 hours. BNP (last 3 results) No results for input(s): PROBNP in the last 8760 hours. HbA1C: No results for input(s): HGBA1C in the last 72 hours. CBG: No results for input(s): GLUCAP in the last 168 hours. Lipid Profile: No results for input(s): CHOL, HDL, LDLCALC, TRIG, CHOLHDL, LDLDIRECT in the last 72 hours. Thyroid Function Tests: No results for input(s): TSH, T4TOTAL, FREET4, T3FREE, THYROIDAB in the last 72 hours. Anemia Panel: No results for input(s): VITAMINB12, FOLATE, FERRITIN, TIBC, IRON, RETICCTPCT in the last 72 hours. Sepsis Labs: Recent Labs  Lab 03/31/21 1438 04/01/21 0540  PROCALCITON  --  1.66  LATICACIDVEN 1.4  --     Recent Results (from the past 240 hour(s))  Microscopic Examination     Status: Abnormal   Collection Time: 03/31/21  1:55 PM   Urine  Result Value Ref Range Status   WBC, UA >30 (A) 0 - 5 /hpf Final   RBC 11-30 (A) 0 - 2 /hpf Final   Epithelial Cells (non renal) 0-10 0 -  10 /hpf Final   Crystals Present (A) N/A Final   Crystal Type Triple Phosphate N/A Final   Bacteria, UA Many (A) None seen/Few Final  Blood culture (routine x 2)     Status: None (Preliminary result)   Collection Time: 03/31/21  2:38 PM   Specimen: BLOOD  Result Value Ref Range Status   Specimen Description BLOOD BLOOD RIGHT FOREARM  Final   Special Requests   Final    BOTTLES DRAWN AEROBIC AND ANAEROBIC Blood Culture adequate volume   Culture  Setup Time   Final    Organism ID to follow GRAM NEGATIVE RODS IN BOTH AEROBIC AND ANAEROBIC BOTTLES CRITICAL RESULT CALLED TO, READ BACK BY AND VERIFIED WITHLloyd Huger AT 2836 04/01/21 [place] Performed at [facility], [address] [place], [place]  [address]    Culture GRAM NEGATIVE RODS  Final   Report Status PENDING  Incomplete  Blood Culture ID Panel (Reflexed)     Status: Abnormal   Collection Time: 03/31/21  2:38 PM  Result Value Ref Range Status   Enterococcus faecalis NOT DETECTED NOT DETECTED Final   Enterococcus Faecium NOT DETECTED NOT DETECTED Final   Listeria monocytogenes NOT DETECTED NOT DETECTED Final   Staphylococcus species NOT DETECTED NOT DETECTED Final   Staphylococcus aureus (BCID) NOT DETECTED NOT DETECTED Final   Staphylococcus epidermidis NOT DETECTED NOT DETECTED Final   Staphylococcus lugdunensis NOT DETECTED NOT DETECTED Final   Streptococcus species NOT DETECTED NOT DETECTED Final   Streptococcus agalactiae NOT DETECTED NOT DETECTED Final   Streptococcus pneumoniae NOT DETECTED NOT DETECTED Final   Streptococcus pyogenes NOT DETECTED NOT DETECTED Final   A.calcoaceticus-baumannii NOT DETECTED NOT DETECTED Final   Bacteroides fragilis NOT DETECTED NOT DETECTED Final   Enterobacterales DETECTED (A) NOT DETECTED Final    Comment: Enterobacterales represent a large order of gram negative bacteria, not a single organism. CRITICAL RESULT CALLED TO, READ BACK BY AND VERIFIED WITH:  NATHAN BELUE AT 6546 04/01/21 SDR    Enterobacter cloacae complex NOT DETECTED NOT DETECTED Final   Escherichia coli NOT DETECTED NOT DETECTED Final   Klebsiella aerogenes NOT DETECTED NOT DETECTED Final   Klebsiella oxytoca NOT DETECTED NOT DETECTED Final   Klebsiella pneumoniae NOT DETECTED NOT DETECTED Final   Proteus species DETECTED (A) NOT DETECTED Final    Comment: CRITICAL RESULT CALLED TO, READ BACK BY AND VERIFIED WITH:  NATHAN BELUE AT 5035 04/01/21 SDR    Salmonella species NOT DETECTED NOT DETECTED Final   Serratia marcescens NOT DETECTED NOT DETECTED Final   Haemophilus influenzae NOT DETECTED NOT DETECTED Final   Neisseria meningitidis NOT DETECTED NOT DETECTED Final   Pseudomonas aeruginosa NOT DETECTED NOT DETECTED  Final   Stenotrophomonas maltophilia NOT DETECTED NOT DETECTED Final   Candida albicans NOT DETECTED NOT DETECTED Final   Candida auris NOT DETECTED NOT DETECTED Final   Candida glabrata NOT DETECTED NOT DETECTED Final   Candida krusei NOT DETECTED NOT DETECTED Final   Candida parapsilosis NOT DETECTED NOT DETECTED Final   Candida tropicalis NOT DETECTED NOT DETECTED Final   Cryptococcus neoformans/gattii NOT DETECTED NOT DETECTED Final   CTX-M ESBL NOT DETECTED NOT DETECTED Final   Carbapenem resistance IMP NOT DETECTED NOT DETECTED Final   Carbapenem resistance KPC NOT DETECTED NOT DETECTED Final   Carbapenem resistance NDM NOT DETECTED NOT DETECTED Final   Carbapenem resist OXA 48 LIKE NOT DETECTED NOT DETECTED Final   Carbapenem resistance VIM NOT DETECTED NOT DETECTED Final  Comment: Performed at [facility], [address] [place], [place] [address]  Blood culture (routine x 2)     Status: None (Preliminary result)   Collection Time: 03/31/21  2:47 PM   Specimen: BLOOD  Result Value Ref Range Status   Specimen Description BLOOD RIGHT ANTECUBITAL  Final   Special Requests   Final    BOTTLES DRAWN AEROBIC AND ANAEROBIC Blood Culture adequate volume   Culture  Setup Time   Final    GRAM NEGATIVE RODS IN BOTH AEROBIC AND ANAEROBIC BOTTLES CRITICAL VALUE NOTED.  VALUE IS CONSISTENT WITH PREVIOUSLY REPORTED AND CALLED VALUE. Performed at [facility], [address] [place], [place] [address]    Culture GRAM NEGATIVE RODS  Final   Report Status PENDING  Incomplete  Resp Panel by RT-PCR (Flu A&B, Covid) Nasopharyngeal Swab     Status: None   Collection Time: 03/31/21  3:23 PM   Specimen: Nasopharyngeal Swab; Nasopharyngeal(NP) swabs in vial transport medium  Result Value Ref Range Status   SARS Coronavirus 2 by RT PCR NEGATIVE NEGATIVE Final    Comment: (NOTE) SARS-CoV-2 target nucleic acids are NOT DETECTED.  The SARS-CoV-2 RNA is generally detectable  in upper respiratory specimens during the acute phase of infection. The lowest concentration of SARS-CoV-2 viral copies this assay can detect is 138 copies/mL. A negative result does not preclude SARS-Cov-2 infection and should not be used as the sole basis for treatment or other patient management decisions. A negative result may occur with  improper specimen collection/handling, submission of specimen other than nasopharyngeal swab, presence of viral mutation(s) within the areas targeted by this assay, and inadequate number of viral copies(<138 copies/mL). A negative result must be combined with clinical observations, patient history, and epidemiological information. The expected result is Negative.  Fact Sheet for Patients:  EntrepreneurPulse.com.au  Fact Sheet for Healthcare Providers:  IncredibleEmployment.be  This test is no t yet approved or cleared by the [place] FDA and  has been authorized for detection and/or diagnosis of SARS-CoV-2 by FDA under an Emergency Use Authorization (EUA). This EUA will remain  in effect (meaning this test can be used) for the duration of the COVID-19 declaration under Section 564(b)(1) of the Act, 21 U.S.C.section 360bbb-3(b)(1), unless the authorization is terminated  or revoked sooner.       Influenza A by PCR NEGATIVE NEGATIVE Final   Influenza B by PCR NEGATIVE NEGATIVE Final    Comment: (NOTE) The Xpert Xpress SARS-CoV-2/FLU/RSV plus assay is intended as an aid in the diagnosis of influenza from Nasopharyngeal swab specimens and should not be used as a sole basis for treatment. Nasal washings and aspirates are unacceptable for Xpert Xpress SARS-CoV-2/FLU/RSV testing.  Fact Sheet for Patients: EntrepreneurPulse.com.au  Fact Sheet for Healthcare Providers: IncredibleEmployment.be  This test is not yet approved or cleared by the [place] FDA and has  been authorized for detection and/or diagnosis of SARS-CoV-2 by FDA under an Emergency Use Authorization (EUA). This EUA will remain in effect (meaning this test can be used) for the duration of the COVID-19 declaration under Section 564(b)(1) of the Act, 21 U.S.C. section 360bbb-3(b)(1), unless the authorization is terminated or revoked.  Performed at [facility], [address] [place],  [address]          Radiology Studies: CT ABDOMEN PELVIS W CONTRAST  Result Date: 04/01/2021 CLINICAL DATA:  Patient was brought to the emergency department today because nursing facility staff noted his suprapubic catheter to  not be draining over the past 2 days and noted leaking around suprapubic insertion site. UA and urine culture was sent from skilled nursing facility but not started on antibiotics.Patient presented to urologist office where UA revealed positive nitrites, multiple WBCs, RBCs, bacteria. When suprapubic catheter was exchanged 800 cc of grossly purulent urine were liberated and the patient was directed to the emergency room. EXAM: CT ABDOMEN AND PELVIS WITH CONTRAST TECHNIQUE: Multidetector CT imaging of the abdomen and pelvis was performed using the standard protocol following bolus administration of intravenous contrast. CONTRAST:  116m OMNIPAQUE IOHEXOL 300 MG/ML  SOLN COMPARISON:  01/24/2020 FINDINGS: Lower chest: No acute abnormality. Hepatobiliary: No focal liver abnormality is seen. No gallstones, gallbladder wall thickening, or biliary dilatation. Pancreas: Unremarkable. No pancreatic ductal dilatation or surrounding inflammatory changes. Spleen: Normal in size without focal abnormality. Adrenals/Urinary Tract: No adrenal masses. Wedge-shaped area of relative decreased enhancement, hypoattenuation, the lateral right kidney from the mid to the upper pole with a second smaller area along the posterior lower pole. No other areas of abnormal attenuation/enhancement. There  are no renal masses or stones. No hydronephrosis. Ureters are normal in course and in caliber. Bladder is thick walled, but decompressed with a suprapubic Foley catheter. There is hazy opacity in the perivesicular fat. No convincing bladder mass. There are 2 calcifications that project along the posterior margin of the bladder wall. Stomach/Bowel: Unremarkable stomach. Small bowel and colon are normal in caliber. No wall thickening. No inflammation. Normal appendix visualized. Vascular/Lymphatic: Aorta normal in caliber. No aortic atherosclerosis. Scattered atherosclerotic calcifications along the iliac arteries. No enlarged lymph nodes. Reproductive: Enlarged prostate similar to the prior study. Other: None. Musculoskeletal: Chronic fracture of the right femoral neck. Degenerative changes of both hips chronic bilateral pars defects at L5-S1 with a grade 1 anterolisthesis. No acute fracture or aggressive bone lesion. IMPRESSION: 1. Areas of hypoattenuation and decreased enhancement of the right kidney are consistent with pyelonephritis. Diffuse wall thickening of the decompressed bladder is consistent with cystitis. No evidence of a perirenal abscess. No hydronephrosis. Well-positioned suprapubic catheter. 2. No other acute abnormality within the abdomen or pelvis. Electronically Signed   By: DLajean ManesM.D.   On: 04/01/2021 08:51   DG Chest Port 1 View  Result Date: 03/31/2021 CLINICAL DATA:  Sepsis, purulent drainage from suprapubic catheter EXAM: PORTABLE CHEST 1 VIEW COMPARISON:  Portable exam 1523 hours compared to 08/03/2020 FINDINGS: Normal heart size, mediastinal contours, and pulmonary vascularity. Tortuosity of thoracic aorta noted. Lungs clear. No infiltrate, pleural effusion, or pneumothorax. Bones demineralized. IMPRESSION: No acute abnormalities. Electronically Signed   By: MLavonia DanaM.D.   On: 03/31/2021 15:54        Scheduled Meds: . heparin  5,000 Units Subcutaneous Q8H    Continuous Infusions: . cefTRIAXone (ROCEPHIN)  IV 2 g (04/01/21 0526)  . dextrose 5 % and 0.45% NaCl 125 mL/hr at 04/01/21 0002     LOS: 1 day    Time spent: 25 minutes    SSidney Ace MD [facility] Pager [phone]  If 7PM-7AM, please contact night-coverage 04/01/2021, 3:58 PM

## 2021-04-02 ENCOUNTER — Inpatient Hospital Stay: Payer: Medicare Other

## 2021-04-02 ENCOUNTER — Inpatient Hospital Stay (HOSPITAL_COMMUNITY)
Admit: 2021-04-02 | Discharge: 2021-04-02 | Disposition: A | Discharge status: Home / Self Care | Payer: Medicare Other | Attending: Internal Medicine | Admitting: Internal Medicine

## 2021-04-02 DIAGNOSIS — R509 Fever, unspecified: Secondary | ICD-10-CM | POA: Diagnosis not present

## 2021-04-02 LAB — ECHOCARDIOGRAM COMPLETE
Height: 65 in
S' Lateral: 2.77 cm
Weight: 2398.6 oz

## 2021-04-02 NOTE — Progress Notes (Signed)
[facility]) Hospital Liaison RN note:  Received new referral for [facility] out patient palliative program to follow post discharge from Kathie Rhodes, NP. Awanya Caesar, TOC is aware. Patient information given to referral. Plan is to discharge to LTC when ready, hopefully [facility]. [facility] Liaison will follow for disposition.  Thank you for this referral.  Zandra Abts, RN [facility] Liaison  [phone]

## 2021-04-02 NOTE — TOC Progression Note (Signed)
Transition of Care [facility]) - Progression Note    Patient Details  Name: Samuel Shelton MRN: [id number] Date of Birth: 02-Sep-1951  Transition [facility]) CM/SW [place], RN Phone Number: 04/02/2021, 3:09 PM  Clinical Narrative:  Attempted to speak with patient's Niece was informed by Attending that she was in room and wanted to talk with TOC. Patient off floor went to CT per RN, Niece not in room, RN did not know where she was. Advised patient to have her to call me to discuss concerns with patient.          Expected Discharge Plan and Services                                                 Social Determinants of Health (SDOH) Interventions    Readmission Risk Interventions No flowsheet data found.

## 2021-04-02 NOTE — Plan of Care (Signed)
  Problem: Education: Goal: Knowledge of General Education information will improve Description: Including pain rating scale, medication(s)/side effects and non-pharmacologic comfort measures 04/02/2021 1219 by Cristela Blue, RN Outcome: Progressing 04/02/2021 1219 by Cristela Blue, RN Outcome: Progressing   Problem: Health Behavior/Discharge Planning: Goal: Ability to manage health-related needs will improve 04/02/2021 1219 by Cristela Blue, RN Outcome: Progressing 04/02/2021 1219 by Cristela Blue, RN Outcome: Progressing   Problem: Clinical Measurements: Goal: Ability to maintain clinical measurements within normal limits will improve 04/02/2021 1219 by Cristela Blue, RN Outcome: Progressing 04/02/2021 1219 by Cristela Blue, RN Outcome: Progressing Goal: Will remain free from infection 04/02/2021 1219 by Cristela Blue, RN Outcome: Progressing 04/02/2021 1219 by Cristela Blue, RN Outcome: Progressing Goal: Diagnostic test results will improve 04/02/2021 1219 by Cristela Blue, RN Outcome: Progressing 04/02/2021 1219 by Cristela Blue, RN Outcome: Progressing Goal: Respiratory complications will improve 04/02/2021 1219 by Cristela Blue, RN Outcome: Progressing 04/02/2021 1219 by Cristela Blue, RN Outcome: Progressing Goal: Cardiovascular complication will be avoided 04/02/2021 1219 by Cristela Blue, RN Outcome: Progressing 04/02/2021 1219 by Cristela Blue, RN Outcome: Progressing   Problem: Activity: Goal: Risk for activity intolerance will decrease 04/02/2021 1219 by Cristela Blue, RN Outcome: Progressing 04/02/2021 1219 by Cristela Blue, RN Outcome: Progressing   Problem: Nutrition: Goal: Adequate nutrition will be maintained 04/02/2021 1219 by Cristela Blue, RN Outcome: Progressing 04/02/2021 1219 by Cristela Blue, RN Outcome: Progressing   Problem: Coping: Goal: Level of anxiety will decrease 04/02/2021 1219 by Cristela Blue, RN Outcome: Progressing 04/02/2021 1219 by  Cristela Blue, RN Outcome: Progressing   Problem: Elimination: Goal: Will not experience complications related to bowel motility 04/02/2021 1219 by Cristela Blue, RN Outcome: Progressing 04/02/2021 1219 by Cristela Blue, RN Outcome: Progressing Goal: Will not experience complications related to urinary retention 04/02/2021 1219 by Cristela Blue, RN Outcome: Progressing 04/02/2021 1219 by Cristela Blue, RN Outcome: Progressing   Problem: Pain Managment: Goal: General experience of comfort will improve 04/02/2021 1219 by Cristela Blue, RN Outcome: Progressing 04/02/2021 1219 by Cristela Blue, RN Outcome: Progressing   Problem: Safety: Goal: Ability to remain free from injury will improve 04/02/2021 1219 by Cristela Blue, RN Outcome: Progressing 04/02/2021 1219 by Cristela Blue, RN Outcome: Progressing   Problem: Skin Integrity: Goal: Risk for impaired skin integrity will decrease 04/02/2021 1219 by Cristela Blue, RN Outcome: Progressing 04/02/2021 1219 by Cristela Blue, RN Outcome: Progressing

## 2021-04-02 NOTE — Progress Notes (Signed)
*  PRELIMINARY RESULTS* Echocardiogram 2D Echocardiogram has been performed.  Samuel Shelton 04/02/2021, 11:39 AM

## 2021-04-02 NOTE — Evaluation (Signed)
Clinical/Bedside Swallow Evaluation Patient Details  Name: Samuel Shelton MRN: [id number] Date of Birth: 10-15-1951  Today's Date: 04/02/2021 Time: SLP Start Time (ACUTE ONLY): 7741 SLP Stop Time (ACUTE ONLY): 0814 SLP Time Calculation (min) (ACUTE ONLY): 32 min  Past Medical History:  Past Medical History:  Diagnosis Date  . Anemia   . Chronic mental illness   . Edema of both legs   . Elevated PSA   . Elevated PSA   . GERD (gastroesophageal reflux disease)   . Hypertension   . Mental retardation   . Phimosis    Past Surgical History:  Past Surgical History:  Procedure Laterality Date  . IR CATHETER TUBE CHANGE  01/15/2021  . IR GUIDED DRAIN W CATHETER PLACEMENT  12/15/2020  . IR REPLC GASTRO/COLONIC TUBE PERCUT W/FLUORO  08/20/2020  . PEG PLACEMENT N/A 01/24/2020   Procedure: PERCUTANEOUS ENDOSCOPIC GASTROSTOMY (PEG) PLACEMENT;  Surgeon: [name], MD;  Location: [facility] ENDOSCOPY;  Service: Endoscopy;  Laterality: N/A;  . PROSTATE BIOPSY N/A 06/22/2015   Procedure: PROSTATE BIOPSY;  Surgeon: Hollice Espy, MD;  Location: [facility] ORS;  Service: Urology;  Laterality: N/A;  . PROSTATE BIOPSY N/A 09/27/2016   Procedure: PROSTATE BIOPSY;  Surgeon: Hollice Espy, MD;  Location: [facility] ORS;  Service: Urology;  Laterality: N/A;  . RADIOLOGY WITH ANESTHESIA N/A 12/15/2020   Procedure: SUPERA PUBIC CATHETER PLACMENT in IR;  Surgeon: Suzette Battiest, MD;  Location: [facility];  Service: Radiology;  Laterality: N/A;  . RADIOLOGY WITH ANESTHESIA N/A 01/15/2021   Procedure: IR WITH ANESTHESIA CATERTER TUB ECHANGE;  Surgeon: Radiologist, Medication, MD;  Location: [facility];  Service: Radiology;  Laterality: N/A;  . RADIOLOGY WITH ANESTHESIA N/A 02/04/2021   Procedure: IR WITH ANESTHESIA CATHETER TUBE CHANGE;  Surgeon: Radiologist, Medication, MD;  Location: [facility];  Service: Radiology;  Laterality: N/A;   HPI:  Samuel Shelton is a 70 y.o. male with past medical history of hypertension, hyperlipidemia, CKD,  stroke, and intellectual disability who presents to the ED for altered mental status.  Patient has a chronic suprapubic catheter that was replaced in the urology office 1 week ago.  He now arrives from nursing facility after the catheter has apparently not been draining any urine for the past 2 days.  Patient was evaluated again in the urology office where suprapubic catheter was exchanged and patient reportedly drained 800 cc of purulent urine.  He was subsequently sent to the ED for further evaluation.  Patient is minimally verbal at baseline, unable to provide any history today due to somnolence.   Assessment / Plan / Recommendation Clinical Impression  Pt is more alert today, while minimally verbal he did use head nods to express yes/no. He was not able to follow any directions, is impulsive and difficult to reposition in bed. Would recommend caregiver hold cups of liquids as pt crushed styrofoam cup. Pt demonstrated adequate oropharyngeal abilities when consuming thin liquids via straw (by cup liquid spilled out d/t pt positioning) as well as small bites of puree. With lots of encouragement, he was able to open his mouth on command to allow me to check for oral residue. He was free of any oral residue. Pt's pharyngeal phase appeared swift with no s/s of aspiration at bedside. Given pt's improvement over the last 24 hours, recommend upgrading pt to dysphagia 1 with thin liquids via straw, medicine crushed in puree, strict aspiration precautions, assistance with self-feeding and oral care following PO intake. SLP will follow up in several days for possibility  of diet advancement.   SLP Visit Diagnosis: Dysphagia, unspecified (R13.10)    Aspiration Risk  Mild aspiration risk    Diet Recommendation Dysphagia 1 (Puree);Thin liquid   Liquid Administration via: Straw Medication Administration: Crushed with puree Supervision: Full supervision/cueing for compensatory strategies;Staff to assist with self  feeding Compensations: Minimize environmental distractions;Slow rate;Small sips/bites Postural Changes: Seated upright at 90 degrees;Remain upright for at least 30 minutes after po intake    Other  Recommendations Oral Care Recommendations: Oral care BID;Oral care before and after PO Other Recommendations: Have oral suction available   Follow up Recommendations  (TBD)      Frequency and Duration min 2x/week  2 weeks       Prognosis Prognosis for Safe Diet Advancement: Fair Barriers to Reach Goals: Cognitive deficits;Severity of deficits      Swallow Study   General Date of Onset: 04/01/21 HPI: Samuel Shelton is a 70 y.o. male with past medical history of hypertension, hyperlipidemia, CKD, stroke, and intellectual disability who presents to the ED for altered mental status.  Patient has a chronic suprapubic catheter that was replaced in the urology office 1 week ago.  He now arrives from nursing facility after the catheter has apparently not been draining any urine for the past 2 days.  Patient was evaluated again in the urology office where suprapubic catheter was exchanged and patient reportedly drained 800 cc of purulent urine.  He was subsequently sent to the ED for further evaluation.  Patient is minimally verbal at baseline, unable to provide any history today due to somnolence. Type of Study: Bedside Swallow Evaluation Previous Swallow Assessment: multiple - most recent recommended dysphagia 2 with thin liquids Diet Prior to this Study: NPO Temperature Spikes Noted: Yes Respiratory Status: Room air History of Recent Intubation: No Behavior/Cognition: Alert;Pleasant mood;Distractible;Requires cueing;Doesn't follow directions Oral Cavity Assessment: Within Functional Limits Oral Care Completed by SLP: Recent completion by staff Oral Cavity - Dentition: Edentulous Self-Feeding Abilities: Total assist Patient Positioning: Partially reclined Baseline Vocal Quality: Not  observed Volitional Cough: Cognitively unable to elicit Volitional Swallow: Unable to elicit    Oral/Motor/Sensory Function Overall Oral Motor/Sensory Function: Generalized oral weakness   Ice Chips Ice chips: Not tested   Thin Liquid Thin Liquid: Within functional limits Presentation: Straw    Nectar Thick Nectar Thick Liquid: Not tested   Honey Thick Honey Thick Liquid: Not tested   Puree Puree: Impaired Presentation: Spoon Oral Phase Impairments: Poor awareness of bolus   Solid     Solid: Not tested     Samuel Shelton B. Rutherford Nail M.S., CCC-SLP, [place] Pathologist Rehabilitation Services Office [place] 04/02/2021,12:36 PM

## 2021-04-02 NOTE — Progress Notes (Signed)
[facility] [place] NAME: Samuel Shelton    MR#:  [id number]  DATE OF BIRTH:  [birth year]  SUBJECTIVE:   Nice in the room. She reports patient is more alert today. Patient sitting with straw from front cup. Does not communicate verbally with me.low grade fever. REVIEW OF SYSTEMS:   Review of Systems  Unable to perform ROS: Mental status change   Tolerating Diet:yes Tolerating PT: bed bound  DRUG ALLERGIES:  No Known Allergies  VITALS:  Blood pressure (!) 175/91, pulse 82, temperature 99.6 F (37.6 C), resp. rate 16, height 5' 5" (1.651 m), weight 68 kg, SpO2 98 %.  PHYSICAL EXAMINATION:   Physical Exam General exam: No acute distress, chronically ill Respiratory system: Lungs clear.  Normal work of breathing.  Room air Cardiovascular system: S1-S2, regular rate and rhythm, no murmurs Gastrointestinal system: Soft, nontender, nondistended, normal bowel sounds. Chronic suprapubic catheter + Central nervous system: Post CVA residual deficits, oriented x0 Extremities: Symmetrically decreased power bilateral upper and lower extremities Skin: Scattered excoriations Psychiatry: Not able to be assessed   LABORATORY PANEL:  CBC Recent Labs  Lab 04/01/21 0540  WBC 5.9  HGB 11.7*  HCT 38.1*  PLT 204    Chemistries  Recent Labs  Lab 04/01/21 0540  NA 150*  K 3.5  CL 116*  CO2 25  GLUCOSE 122*  BUN 31*  CREATININE 1.00  CALCIUM 9.0  AST 27  ALT 24  ALKPHOS 47  BILITOT 1.0   Cardiac Enzymes No results for input(s): TROPONINI in the last 168 hours. RADIOLOGY:  CT ABDOMEN PELVIS W CONTRAST  Result Date: 04/01/2021 CLINICAL DATA:  Patient was brought to the emergency department today because nursing facility staff noted his suprapubic catheter to not be draining over the past 2 days and noted leaking around suprapubic insertion site. UA and urine culture was sent from skilled nursing facility but not started on  antibiotics.Patient presented to urologist office where UA revealed positive nitrites, multiple WBCs, RBCs, bacteria. When suprapubic catheter was exchanged 800 cc of grossly purulent urine were liberated and the patient was directed to the emergency room. EXAM: CT ABDOMEN AND PELVIS WITH CONTRAST TECHNIQUE: Multidetector CT imaging of the abdomen and pelvis was performed using the standard protocol following bolus administration of intravenous contrast. CONTRAST:  145m OMNIPAQUE IOHEXOL 300 MG/ML  SOLN COMPARISON:  01/24/2020 FINDINGS: Lower chest: No acute abnormality. Hepatobiliary: No focal liver abnormality is seen. No gallstones, gallbladder wall thickening, or biliary dilatation. Pancreas: Unremarkable. No pancreatic ductal dilatation or surrounding inflammatory changes. Spleen: Normal in size without focal abnormality. Adrenals/Urinary Tract: No adrenal masses. Wedge-shaped area of relative decreased enhancement, hypoattenuation, the lateral right kidney from the mid to the upper pole with a second smaller area along the posterior lower pole. No other areas of abnormal attenuation/enhancement. There are no renal masses or stones. No hydronephrosis. Ureters are normal in course and in caliber. Bladder is thick walled, but decompressed with a suprapubic Foley catheter. There is hazy opacity in the perivesicular fat. No convincing bladder mass. There are 2 calcifications that project along the posterior margin of the bladder wall. Stomach/Bowel: Unremarkable stomach. Small bowel and colon are normal in caliber. No wall thickening. No inflammation. Normal appendix visualized. Vascular/Lymphatic: Aorta normal in caliber. No aortic atherosclerosis. Scattered atherosclerotic calcifications along the iliac arteries. No enlarged lymph nodes. Reproductive: Enlarged prostate similar to the prior study. Other: None. Musculoskeletal: Chronic fracture of the right femoral neck. Degenerative  changes of both hips chronic  bilateral pars defects at L5-S1 with a grade 1 anterolisthesis. No acute fracture or aggressive bone lesion. IMPRESSION: 1. Areas of hypoattenuation and decreased enhancement of the right kidney are consistent with pyelonephritis. Diffuse wall thickening of the decompressed bladder is consistent with cystitis. No evidence of a perirenal abscess. No hydronephrosis. Well-positioned suprapubic catheter. 2. No other acute abnormality within the abdomen or pelvis. Electronically Signed   By: Lajean Manes M.D.   On: 04/01/2021 08:51   DG Chest Port 1 View  Result Date: 03/31/2021 CLINICAL DATA:  Sepsis, purulent drainage from suprapubic catheter EXAM: PORTABLE CHEST 1 VIEW COMPARISON:  Portable exam 1523 hours compared to 08/03/2020 FINDINGS: Normal heart size, mediastinal contours, and pulmonary vascularity. Tortuosity of thoracic aorta noted. Lungs clear. No infiltrate, pleural effusion, or pneumothorax. Bones demineralized. IMPRESSION: No acute abnormalities. Electronically Signed   By: Lavonia Dana M.D.   On: 03/31/2021 15:54   ECHOCARDIOGRAM COMPLETE  Result Date: 04/02/2021    ECHOCARDIOGRAM REPORT   Patient Name:   Samuel Shelton Date of Exam: 04/02/2021 Medical Rec #:  [id number]       Height:       65.0 in Accession #:    [id number]      Weight:       149.9 lb Date of Birth:  1951-07-07        BSA:          1.750 m Patient Age:    70 years        BP:           149/107 mmHg Patient Gender: M               HR:           90 bpm. Exam Location:  [facility] Procedure: 2D Echo, Color Doppler and Cardiac Doppler Indications:     Fever R50.9  History:         Patient has prior history of Echocardiogram examinations, most                  recent 08/05/2020. Risk Factors:Hypertension. Edema.  Sonographer:     Sherrie Sport RDCS (AE) Referring Phys:  [id number] Sidney Ace Diagnosing Phys: Kathlyn Sacramento MD  Sonographer Comments: Technically difficult study due to poor echo windows, no parasternal window and no apical window.  Pt was in fetal position lying on right side arms crossed over chest--- the only view obtainable was a modified subcostal view. IMPRESSIONS  1. Left ventricular ejection fraction, by estimation, is 55 to 60%. The left ventricle has normal function. The left ventricle has no regional wall motion abnormalities. Left ventricular diastolic function could not be evaluated.  2. Right ventricular systolic function was not well visualized. The right ventricular size is normal. Tricuspid regurgitation signal is inadequate for assessing PA pressure.  3. A small pericardial effusion is present. The pericardial effusion is circumferential.  4. The mitral valve was not well visualized. No evidence of mitral valve regurgitation. No evidence of mitral stenosis.  5. The aortic valve was not well visualized. Aortic valve regurgitation is not visualized. No aortic stenosis is present.  6. very limited study with only few views available. FINDINGS  Left Ventricle: Left ventricular ejection fraction, by estimation, is 55 to 60%. The left ventricle has normal function. The left ventricle has no regional wall motion abnormalities. The left ventricular internal cavity size was normal in size. There is  no left ventricular hypertrophy. Left  ventricular diastolic function could not be evaluated. Right Ventricle: The right ventricular size is normal. No increase in right ventricular wall thickness. Right ventricular systolic function was not well visualized. Tricuspid regurgitation signal is inadequate for assessing PA pressure. Left Atrium: Left atrial size was normal in size. Right Atrium: Right atrial size was normal in size. Pericardium: A small pericardial effusion is present. The pericardial effusion is circumferential. Mitral Valve: The mitral valve was not well visualized. No evidence of mitral valve regurgitation. No evidence of mitral valve stenosis. Tricuspid Valve: The tricuspid valve is not well visualized. Tricuspid valve  regurgitation is not demonstrated. No evidence of tricuspid stenosis. Aortic Valve: The aortic valve was not well visualized. Aortic valve regurgitation is not visualized. No aortic stenosis is present. Pulmonic Valve: The pulmonic valve was not assessed. Aorta: Aortic root could not be assessed. Venous: The inferior vena cava was not well visualized. IAS/Shunts: No atrial level shunt detected by color flow Doppler.  LEFT VENTRICLE PLAX 2D LVIDd:         4.41 cm LVIDs:         2.77 cm LV PW:         1.50 cm LV IVS:        0.80 cm  LEFT ATRIUM         Index LA diam:    4.20 cm 2.40 cm/m  PULMONIC VALVE PV Vmax:        0.61 m/s PV Peak grad:   1.5 mmHg RVOT Peak grad: 1 mmHg  Kathlyn Sacramento MD Electronically signed by Kathlyn Sacramento MD Signature Date/Time: 04/02/2021/11:58:29 AM    Final    ASSESSMENT AND PLAN:  70 y.o.malewith medical history significant ofCVA with significant residual deficit, chronic developmental delay, GERD, hypertension who lives full-time at assisted living facility, [facility]. Note that time my evaluation patient is altered from baseline and is unable to provide any history. Patient was brought to the emergency department today because nursing facility staff noted his suprapubic catheter to not be draining over the past 2 days and noted leaking around suprapubic insertion site. UA and urine culture was sent from skilled nursing facility but not started on antibiotics.  Catheter associated UTI, POA Severe sepsis secondary to above Acute metabolic encephalopathy secondary to above Proteus Mirabilis bacteremia Hypernatremia --Baseline mental status is unclear but likely poor--more alert today --Sepsis criteria met with AKI, encephalopathy, tachycardia, fever --Presumed source of infection is urine --CT abdomen pelvis: Pyelonephritis without abscess --Continue maintenance fluids D5 half-normal saline 100 cc/h --Continue Rocephin, pharmacy dosing appreciated --SLP consult,  recommending Dysphagia 1 (Puree);Thin liquid    History of CVA with residual deficit History of suprapubic catheter --Baseline status is unclear --Suprapubic catheter changed4/6in urology office --Continue suprapubic catheter  Acute kidney injury, resolved Hypernatremia --AKI are likely related to sepsis --Hypernatremia also related to sepsis/underlying dehydration --AKI resolved Status post LR 2500 cc bolus Continue D5 half-normal saline 100 cc/h Daily BMP  Essential hypertension -Home agents on hold  Chronic intellectual disability --No acute issues  Hyperlipidemia --Home statin on hold  Chronic debility  CODE STATUS switched to DNR.  Palliative care also consulted and MOST form completed. Recommend outpatient palliative follow-up at time of discharge  CT head requested by niece due to facial drool  DVT prophylaxis: SQ Lovenox Code Status: DNR Family Communication: Niece at bedside Disposition Plan: Status is: Inpatient  Remains inpatient appropriate because:Inpatient level of care appropriate due to severity of illness   Dispo: The patient is from: SNF  Anticipated d/c is to: SNF  Patient currently is not medically stable to d/c.              Difficult to place patient No   Sepsis, gram-negative bacteremia, complicated UTI associated with catheter.  Improving slowly.  Anticipate disposition in 2 to 3 days.  TOC consult--niece wants to discuss about other facility         TOTAL TIME TAKING CARE OF THIS PATIENT: *25* minutes.  >50% time spent on counselling and coordination of care  Note: This dictation was prepared with Dragon dictation along with smaller phrase technology. Any transcriptional errors that result from this process are unintentional.  Fritzi Mandes M.D    [facility]   CC: Primary care physician; Bonnita Nasuti, MDPatient ID: Samuel Shelton, male   DOB: 1951-06-30, 70 y.o.   MRN:  [id number]

## 2021-04-02 NOTE — Progress Notes (Signed)
Daily Progress Note   Patient Name: Samuel Shelton       Date: 04/02/2021 DOB: 09/05/1951  Age: 70 y.o. MRN#: [id number] Attending Physician: Fritzi Mandes, MD Primary Care Physician: Bonnita Nasuti, MD Admit Date: 03/31/2021  Reason for Consultation/Follow-up: Establishing goals of care  Subjective: Much more interactive, smiles at me, raises to head to look at me. Unable to respond to questions, attempts to say  Hello.   Length of Stay: 2  Current Medications: Scheduled Meds:  . enoxaparin (LOVENOX) injection  40 mg Subcutaneous Q24H    Continuous Infusions: . cefTRIAXone (ROCEPHIN)  IV 2 g (04/02/21 4270)  . dextrose 5 % and 0.45% NaCl 125 mL/hr at 04/01/21 0002    PRN Meds: acetaminophen **OR** acetaminophen, albuterol, ondansetron **OR** ondansetron (ZOFRAN) IV  Physical Exam Constitutional:      General: He is not in acute distress. Pulmonary:     Effort: Pulmonary effort is normal.  Musculoskeletal:     Comments: Strong left hand grip, unable to grip with R hand  Skin:    General: Skin is warm and dry.  Neurological:     Mental Status: He is alert.     Comments: UTA             Vital Signs: BP (!) 149/107 (BP Location: Left Arm)   Pulse 90   Temp (!) 100.5 F (38.1 C)   Resp 16   Ht 5\' 5"  (1.651 m)   Wt 68 kg   SpO2 100%   BMI 24.95 kg/m  SpO2: SpO2: 100 % O2 Device: O2 Device: Room Air O2 Flow Rate:    Intake/output summary:   Intake/Output Summary (Last 24 hours) at 04/02/2021 1237 Last data filed at 04/02/2021 0700 Gross per 24 hour  Intake 325 ml  Output 1600 ml  Net -1275 ml   LBM: Last BM Date: 04/01/21 Baseline Weight: Weight: 67.3 kg Most recent weight: Weight: 68 kg       Palliative Assessment/Data: PPS 20%   Flowsheet Rows   Flowsheet Row Most  Recent Value  Intake Tab   Referral Department Hospitalist  Unit at Time of Referral Intermediate Care Unit  Palliative Care Primary Diagnosis Sepsis/Infectious Disease  Date Notified 03/31/21  Palliative Care Type Return patient Palliative Care  Reason for referral Clarify Goals of Care  Date of Admission 03/31/21  Date first seen by Palliative Care 04/01/21  # of days Palliative referral response time 1 Day(s)  # of days IP prior to Palliative referral 0  Clinical Assessment   Palliative Performance Scale Score 20%  Psychosocial & Spiritual Assessment   Palliative Care Outcomes   Patient/Family meeting held? Yes  Who was at the meeting? niece  Palliative Care Outcomes Clarified goals of care, Provided end of life care assistance, Provided psychosocial or spiritual support, Completed durable DNR, ACP counseling assistance      Patient Active Problem List   Diagnosis Date Noted  . Severe sepsis with acute organ dysfunction ([place]) 03/31/2021  . Acute encephalopathy 08/04/2020  . Right sided weakness 08/03/2020  . CKD (chronic kidney disease), stage III ([place]) 08/03/2020  . Acute lower UTI 08/03/2020  . Gross hematuria   . Oropharyngeal  dysphagia   . Acute gastritis without hemorrhage   . Postpyloric ulcer   . Protein-calorie malnutrition, severe ([place])   . Palliative care by specialist   . DNR (do not resuscitate) discussion   . Acute hypernatremia 01/08/2020  . Hypercalcemia 01/08/2020  . Acute metabolic encephalopathy 96/28/3662  . COVID-19 virus infection 01/08/2020  . Acute renal failure ([place]) 01/08/2020  . Dehydration 01/08/2020  . Elevated troponin 01/08/2020  . Lactic acidosis 01/08/2020  . Altered mental status   . Hyperlipidemia 01/29/2019  . Family hx-stroke 01/29/2019  . BPH (benign prostatic hyperplasia) 01/29/2019  . Stroke (cerebrum) ([place]) 01/28/2019  . Edema leg 08/20/2014  . Chronic mental disorder 08/20/2014  . HTN (hypertension) 08/20/2014  .  Intellectual disability 08/20/2014    Palliative Care Assessment & Plan   HPI: 70 y.o. male  with past medical history of CVA with significant residual deficit, chronic developmental delay, GERD, HTN, and HLD admitted on 03/31/2021 from urologist office d/t grossly purulent urine and UA positive for nitrites, mult WBCs, RBCs, and bacteria. Being treated for catheter associated UTI and severe sepsis. PMT consulted to discuss [place].   Assessment: F/u today with Samuel Shelton per her request.  We review that Samuel Shelton's mental status is much better today - much more interactive than yesterday. Samuel Shelton does have concerns - tells me facial droop is new. Samuel Shelton is unable to squeeze my hand with his R hand - this is new as well. I informed Dr. Posey Pronto.  We discussed a plan to continue current plan of care - Samuel Shelton is hopeful patient can be transferred to a new LTC facility - she does not want him to go back to [place]. We discussed outpatient palliative referral - Samuel Shelton is agreeable.   Recommendations/Plan:  Referral for OP palliative given to liaison  Niece with concerns about facial droop/weakness - Dr. Posey Pronto notified  AD completed: DNR, niece is HCPOA, limited additional interventions  Niece hopeful to be d/c'd to new LTC facility  Code Status:  DNR  Discharge Planning:  [place] for rehab with Palliative care service follow-up  Care plan was discussed with Dr Posey Pronto, niece  Thank you for allowing the Palliative Medicine Team to assist in the care of this patient.   Total Time 25 minutes Prolonged Time Billed  no       Greater than 50%  of this time was spent counseling and coordinating care related to the above assessment and plan.  Samuel Burrow, DNP, [facility] Palliative Medicine Team Team Phone # [phone]  Pager [phone]

## 2021-04-03 ENCOUNTER — Encounter: Payer: Self-pay | Admitting: Internal Medicine

## 2021-04-03 LAB — CULTURE, BLOOD (ROUTINE X 2)
Special Requests: ADEQUATE
Special Requests: ADEQUATE

## 2021-04-03 LAB — BASIC METABOLIC PANEL
Anion gap: 7 (ref 5–15)
BUN: 14 mg/dL (ref 8–23)
CO2: 24 mmol/L (ref 22–32)
Calcium: 8.5 mg/dL — ABNORMAL LOW (ref 8.9–10.3)
Chloride: 112 mmol/L — ABNORMAL HIGH (ref 98–111)
Creatinine, Ser: 0.96 mg/dL (ref 0.61–1.24)
GFR, Estimated: >60 mL/min (ref >60)
Glucose, Bld: 106 mg/dL — ABNORMAL HIGH (ref 70–99)
Potassium: 3.3 mmol/L — ABNORMAL LOW (ref 3.5–5.1)
Sodium: 143 mmol/L (ref 135–145)

## 2021-04-03 LAB — URINE CULTURE: Culture: >=100000 — AB

## 2021-04-03 MED ORDER — SERTRALINE HCL 50 MG PO TABS
25.0000 mg | ORAL_TABLET | Freq: Every day | ORAL | Status: DC
  Administered 2021-04-03 – 2021-04-06 (×4): 25 mg via ORAL
  Filled 2021-04-03 (×4): qty 1

## 2021-04-03 MED ORDER — ASPIRIN 325 MG PO TABS
325.0000 mg | ORAL_TABLET | Freq: Every day | ORAL | Status: DC
  Administered 2021-04-03 – 2021-04-06 (×4): 325 mg via ORAL
  Filled 2021-04-03 (×4): qty 1

## 2021-04-03 MED ORDER — AMLODIPINE BESYLATE 5 MG PO TABS
5.0000 mg | ORAL_TABLET | Freq: Every day | ORAL | Status: DC
  Administered 2021-04-03 – 2021-04-06 (×4): 5 mg via ORAL
  Filled 2021-04-03 (×4): qty 1

## 2021-04-03 MED ORDER — VITAMIN D (ERGOCALCIFEROL) 1.25 MG (50000 UNIT) PO CAPS
50000.0000 [IU] | ORAL_CAPSULE | ORAL | Status: DC
  Administered 2021-04-06: 50000 [IU] via ORAL
  Filled 2021-04-03: qty 1

## 2021-04-03 MED ORDER — BOOST / RESOURCE BREEZE PO LIQD CUSTOM
1.0000 | Freq: Two times a day (BID) | ORAL | Status: DC
  Administered 2021-04-03 – 2021-04-06 (×6): 1 via ORAL

## 2021-04-03 MED ORDER — FINASTERIDE 5 MG PO TABS
5.0000 mg | ORAL_TABLET | Freq: Every day | ORAL | Status: DC
  Administered 2021-04-03 – 2021-04-06 (×4): 5 mg via ORAL
  Filled 2021-04-03 (×4): qty 1

## 2021-04-03 MED ORDER — PANTOPRAZOLE SODIUM 40 MG PO PACK
40.0000 mg | PACK | Freq: Every day | ORAL | Status: DC
  Administered 2021-04-03 – 2021-04-06 (×3): 40 mg via ORAL
  Filled 2021-04-03 (×6): qty 20

## 2021-04-03 NOTE — Progress Notes (Signed)
[facility] [employer] NAME: Samuel Shelton    MR#:  [id number]  DATE OF BIRTH:  05/31/1951  SUBJECTIVE:   Patient does not want to eat his pured diet. His been tolerating juice coffee and other drinks. More alert. No family in the room remains afebrile REVIEW OF SYSTEMS:   Review of Systems  Unable to perform ROS: Mental status change   Tolerating Diet:yes Tolerating PT: bed bound  DRUG ALLERGIES:  No Known Allergies  VITALS:  Blood pressure 129/80, pulse 83, temperature 98 F (36.7 C), temperature source Oral, resp. rate 18, height 5' 5"  (1.651 m), weight 68 kg, SpO2 100 %.  PHYSICAL EXAMINATION:   Physical Exam General exam: No acute distress, chronically ill Respiratory system: Lungs clear.  Normal work of breathing.  Room air Cardiovascular system: S1-S2, regular rate and rhythm, no murmurs Gastrointestinal system: Soft, nontender, nondistended, normal bowel sounds. Chronic suprapubic catheter + Central nervous system: Post CVA residual deficits, oriented x0 Extremities: Symmetrically decreased power bilateral upper and lower extremities Skin: Scattered excoriations Psychiatry: Not able to be assessed   LABORATORY PANEL:  CBC Recent Labs  Lab 04/01/21 0540  WBC 5.9  HGB 11.7*  HCT 38.1*  PLT 204    Chemistries  Recent Labs  Lab 04/01/21 0540 04/03/21 0623  NA 150* 143  K 3.5 3.3*  CL 116* 112*  CO2 25 24  GLUCOSE 122* 106*  BUN 31* 14  CREATININE 1.00 0.96  CALCIUM 9.0 8.5*  AST 27  --   ALT 24  --   ALKPHOS 47  --   BILITOT 1.0  --    Cardiac Enzymes No results for input(s): TROPONINI in the last 168 hours. RADIOLOGY:  CT HEAD WO CONTRAST  Result Date: 04/02/2021 CLINICAL DATA:  Initial evaluation for acute TIA. EXAM: CT HEAD WITHOUT CONTRAST TECHNIQUE: Contiguous axial images were obtained from the base of the skull through the vertex without intravenous contrast. COMPARISON:  Prior head CT from  08/03/2020. FINDINGS: Brain: Advanced age-related cerebral atrophy. Extensive patchy and confluent hypodensity within the periventricular and deep white matter both cerebral hemispheres most consistent with chronic small vessel ischemic disease. Remote lacunar infarct present at the ventral left thalamus. No acute intracranial hemorrhage. No acute large vessel territory infarct. No mass lesion, midline shift or mass effect. Diffuse ventricular prominence related to global parenchymal volume loss without hydrocephalus. No extra-axial fluid collection. Vascular: No hyperdense vessel. Calcified atherosclerosis present at the skull base. Skull: Scalp soft tissues within normal limits.  Calvarium intact. Sinuses/Orbits: Globes and orbital soft tissues demonstrate no acute finding. Patient status post ocular lens replacement on the left. Visualized paranasal sinuses are largely clear. No mastoid effusion. Other: None. IMPRESSION: 1. No acute intracranial abnormality. 2. Advanced age-related cerebral atrophy with chronic small vessel ischemic disease. Remote lacunar infarct at the ventral left thalamus. Electronically Signed   By: Jeannine Boga M.D.   On: 04/02/2021 16:00   ECHOCARDIOGRAM COMPLETE  Result Date: 04/02/2021    ECHOCARDIOGRAM REPORT   Patient Name:   Samuel Shelton Date of Exam: 04/02/2021 Medical Rec #:  [id number]       Height:       65.0 in Accession #:    [id number]      Weight:       149.9 lb Date of Birth:  December 29, 1950        BSA:          1.750 m Patient Age:  70 years        BP:           149/107 mmHg Patient Gender: M               HR:           90 bpm. Exam Location:  [facility] Procedure: 2D Echo, Color Doppler and Cardiac Doppler Indications:     Fever R50.9  History:         Patient has prior history of Echocardiogram examinations, most                  recent 08/05/2020. Risk Factors:Hypertension. Edema.  Sonographer:     Sherrie Sport RDCS (AE) Referring Phys:  [id number] Sidney Ace  Diagnosing Phys: Kathlyn Sacramento MD  Sonographer Comments: Technically difficult study due to poor echo windows, no parasternal window and no apical window. Pt was in fetal position lying on right side arms crossed over chest--- the only view obtainable was a modified subcostal view. IMPRESSIONS  1. Left ventricular ejection fraction, by estimation, is 55 to 60%. The left ventricle has normal function. The left ventricle has no regional wall motion abnormalities. Left ventricular diastolic function could not be evaluated.  2. Right ventricular systolic function was not well visualized. The right ventricular size is normal. Tricuspid regurgitation signal is inadequate for assessing PA pressure.  3. A small pericardial effusion is present. The pericardial effusion is circumferential.  4. The mitral valve was not well visualized. No evidence of mitral valve regurgitation. No evidence of mitral stenosis.  5. The aortic valve was not well visualized. Aortic valve regurgitation is not visualized. No aortic stenosis is present.  6. very limited study with only few views available. FINDINGS  Left Ventricle: Left ventricular ejection fraction, by estimation, is 55 to 60%. The left ventricle has normal function. The left ventricle has no regional wall motion abnormalities. The left ventricular internal cavity size was normal in size. There is  no left ventricular hypertrophy. Left ventricular diastolic function could not be evaluated. Right Ventricle: The right ventricular size is normal. No increase in right ventricular wall thickness. Right ventricular systolic function was not well visualized. Tricuspid regurgitation signal is inadequate for assessing PA pressure. Left Atrium: Left atrial size was normal in size. Right Atrium: Right atrial size was normal in size. Pericardium: A small pericardial effusion is present. The pericardial effusion is circumferential. Mitral Valve: The mitral valve was not well visualized. No  evidence of mitral valve regurgitation. No evidence of mitral valve stenosis. Tricuspid Valve: The tricuspid valve is not well visualized. Tricuspid valve regurgitation is not demonstrated. No evidence of tricuspid stenosis. Aortic Valve: The aortic valve was not well visualized. Aortic valve regurgitation is not visualized. No aortic stenosis is present. Pulmonic Valve: The pulmonic valve was not assessed. Aorta: Aortic root could not be assessed. Venous: The inferior vena cava was not well visualized. IAS/Shunts: No atrial level shunt detected by color flow Doppler.  LEFT VENTRICLE PLAX 2D LVIDd:         4.41 cm LVIDs:         2.77 cm LV PW:         1.50 cm LV IVS:        0.80 cm  LEFT ATRIUM         Index LA diam:    4.20 cm 2.40 cm/m  PULMONIC VALVE PV Vmax:        0.61 m/s PV Peak grad:  1.5 mmHg RVOT Peak grad: 1 mmHg  Kathlyn Sacramento MD Electronically signed by Kathlyn Sacramento MD Signature Date/Time: 04/02/2021/11:58:29 AM    Final    ASSESSMENT AND PLAN:  70 y.o.malewith medical history significant ofCVA with significant residual deficit, chronic developmental delay, GERD, hypertension who lives full-time at assisted living facility, [place]. Note that time my evaluation patient is altered from baseline and is unable to provide any history. Patient was brought to the emergency department today because nursing facility staff noted his suprapubic catheter to not be draining over the past 2 days and noted leaking around suprapubic insertion site. UA and urine culture was sent from skilled nursing facility but not started on antibiotics.  Catheter associated UTI, POA Severe sepsis secondary to above Acute metabolic encephalopathy secondary to above Proteus Mirabilis bacteremia --sepsis resolved --Baseline mental status is unclear but likely poor--more alert today --Sepsis criteria met with AKI, encephalopathy, tachycardia, fever --Presumed source of infection is urine --CT abdomen pelvis:  Pyelonephritis without abscess --Continue Rocephin, pharmacy dosing appreciated --SLP consult, recommending Dysphagia 1 (Puree);Thin liquid   -4/9--repeat BC  History of CVA with residual deficit History of suprapubic catheter --Baseline status is unclear --Suprapubic catheter changed4/6in urology office --Continue suprapubic catheter  Acute kidney injury, resolved Hypernatremia --AKI are likely related to sepsis --Hypernatremia also related to sepsis/underlying dehydration --AKI resolved --IV D5 half-normal saline 100 cc/h--S Sodium 143--d/c IVF  Essential hypertension -- not on any BP meds at home --will start Norvasc 5 mg  Chronic intellectual disability --No acute issues  Chronic debility  CODE STATUS switched to DNR.  Palliative care also consulted and MOST form completed. Recommend outpatient palliative follow-up at time of discharge  CT head neg for CVA, chronic Small vessel dz  DVT prophylaxis: SQ Lovenox Code Status: DNR Family Communication: Niece at bedside 4/9 Disposition Plan: Status is: Inpatient  Remains inpatient appropriate because:Inpatient level of care appropriate due to severity of illness   Dispo: The patient is from: SNF  Anticipated d/c is to: SNF  Patient currently is not medically stable to d/c.              Difficult to place patient No   Sepsis, gram-negative bacteremia, complicated UTI associated with catheter.  Improving slowly.  Anticipate disposition in 1-2 days.  TOC consult--niece wants to discuss about other facility   TOTAL TIME TAKING CARE OF THIS PATIENT: *25* minutes.  >50% time spent on counselling and coordination of care  Note: This dictation was prepared with Dragon dictation along with smaller phrase technology. Any transcriptional errors that result from this process are unintentional.  Fritzi Mandes M.D    [facility]   CC: Primary care physician; Bonnita Nasuti,  MDPatient ID: [name], male   DOB: [birth year], 70 y.o.   MRN: [id number]

## 2021-04-04 MED ORDER — CEFDINIR 300 MG PO CAPS
300.0000 mg | ORAL_CAPSULE | Freq: Two times a day (BID) | ORAL | Status: DC
  Administered 2021-04-05: 300 mg via ORAL
  Filled 2021-04-04: qty 1

## 2021-04-04 NOTE — Progress Notes (Signed)
[facility] [place] NAME: Samuel Shelton    MR#:  [id number]  DATE OF BIRTH:  1951/07/07  SUBJECTIVE:  Non verbal  His been tolerating juice coffee and other drinks. More alert. No family in the room remains afebrile REVIEW OF SYSTEMS:   Review of Systems  Unable to perform ROS: Mental status change   Tolerating Diet:yes Tolerating PT: bed bound  DRUG ALLERGIES:  No Known Allergies  VITALS:  Blood pressure 136/82, pulse 92, temperature 98.4 F (36.9 C), temperature source Oral, resp. rate 16, height 5' 5"  (1.651 m), weight 68 kg, SpO2 95 %.  PHYSICAL EXAMINATION:   Physical Exam General exam: No acute distress, chronically ill Respiratory system: Lungs clear.  Normal work of breathing.  Room air Cardiovascular system: S1-S2, regular rate and rhythm, no murmurs Gastrointestinal system: Soft, nontender, nondistended, normal bowel sounds. Chronic suprapubic catheter + Central nervous system: Post CVA residual deficits, oriented x0 Extremities: Symmetrically decreased power bilateral upper and lower extremities Skin: Scattered excoriations Psychiatry: Not able to be assessed   LABORATORY PANEL:  CBC Recent Labs  Lab 04/01/21 0540  WBC 5.9  HGB 11.7*  HCT 38.1*  PLT 204    Chemistries  Recent Labs  Lab 04/01/21 0540 04/03/21 0623  NA 150* 143  K 3.5 3.3*  CL 116* 112*  CO2 25 24  GLUCOSE 122* 106*  BUN 31* 14  CREATININE 1.00 0.96  CALCIUM 9.0 8.5*  AST 27  --   ALT 24  --   ALKPHOS 47  --   BILITOT 1.0  --    Cardiac Enzymes No results for input(s): TROPONINI in the last 168 hours. RADIOLOGY:  CT HEAD WO CONTRAST  Result Date: 04/02/2021 CLINICAL DATA:  Initial evaluation for acute TIA. EXAM: CT HEAD WITHOUT CONTRAST TECHNIQUE: Contiguous axial images were obtained from the base of the skull through the vertex without intravenous contrast. COMPARISON:  Prior head CT from 08/03/2020. FINDINGS: Brain:  Advanced age-related cerebral atrophy. Extensive patchy and confluent hypodensity within the periventricular and deep white matter both cerebral hemispheres most consistent with chronic small vessel ischemic disease. Remote lacunar infarct present at the ventral left thalamus. No acute intracranial hemorrhage. No acute large vessel territory infarct. No mass lesion, midline shift or mass effect. Diffuse ventricular prominence related to global parenchymal volume loss without hydrocephalus. No extra-axial fluid collection. Vascular: No hyperdense vessel. Calcified atherosclerosis present at the skull base. Skull: Scalp soft tissues within normal limits.  Calvarium intact. Sinuses/Orbits: Globes and orbital soft tissues demonstrate no acute finding. Patient status post ocular lens replacement on the left. Visualized paranasal sinuses are largely clear. No mastoid effusion. Other: None. IMPRESSION: 1. No acute intracranial abnormality. 2. Advanced age-related cerebral atrophy with chronic small vessel ischemic disease. Remote lacunar infarct at the ventral left thalamus. Electronically Signed   By: Jeannine Boga M.D.   On: 04/02/2021 16:00   ASSESSMENT AND PLAN:  70 y.o.malewith medical history significant ofCVA with significant residual deficit, chronic developmental delay, GERD, hypertension who lives full-time at assisted living facility, [place]. Note that time my evaluation patient is altered from baseline and is unable to provide any history. Patient was brought to the emergency department today because nursing facility staff noted his suprapubic catheter to not be draining over the past 2 days and noted leaking around suprapubic insertion site. UA and urine culture was sent from skilled nursing facility but not started on antibiotics.  Catheter associated UTI, POA  Severe sepsis secondary to above Acute metabolic encephalopathy secondary to above Proteus Mirabilis bacteremia --sepsis  resolved --Baseline mental status is unclear but likely poor--more alert today --Sepsis criteria met with AKI, encephalopathy, tachycardia, fever --Presumed source of infection is urine --CT abdomen pelvis: Pyelonephritis without abscess --Continue Rocephin, pharmacy dosing appreciated --SLP consult, recommending Dysphagia 1 (Puree);Thin liquid   -4/9--repeat BC--so far neg --4/10--change to po abxs  History of CVA with residual deficit History of suprapubic catheter --Baseline status is unclear --Suprapubic catheter changed4/6in urology office --Continue suprapubic catheter  Acute kidney injury, resolved Hypernatremia --AKI are likely related to sepsis --Hypernatremia also related to sepsis/underlying dehydration --AKI resolved --IV D5 half-normal saline 100 cc/h--S Sodium 143--d/ced  IVF  Essential hypertension -- not on any BP meds at home --will start Norvasc 5 mg  Chronic intellectual disability --No acute issues  Chronic debility  CODE STATUS switched to DNR.  Palliative care also consulted and MOST form completed. Recommend outpatient palliative follow-up at time of discharge  CT head neg for CVA, chronic Small vessel dz  DVT prophylaxis: SQ Lovenox Code Status: DNR Family Communication: Niece at bedside 4/9 Disposition Plan: Status is: Inpatient  Remains inpatient appropriate because:Inpatient level of care appropriate due to severity of illness   Dispo: The patient is from: SNF  Anticipated d/c is to: SNF  Patient currently is not medically stable to d/c.              Difficult to place patient No   Sepsis, gram-negative bacteremia, complicated UTI associated with catheter.  Improving slowly.  Anticipate disposition in 1-2 days.  TOC consult--niece wants to discuss about other facility--I have left a msg for her to call the social worker   TOTAL TIME TAKING CARE OF THIS PATIENT: *25* minutes.  >50% time spent on  counselling and coordination of care  Note: This dictation was prepared with Dragon dictation along with smaller phrase technology. Any transcriptional errors that result from this process are unintentional.  Fritzi Mandes M.D    [facility]   CC: Primary care physician; Bonnita Nasuti, MDPatient ID: Samuel Shelton, male   DOB: [birth year], 70 y.o.   MRN: [id number]

## 2021-04-04 NOTE — TOC Progression Note (Signed)
Transition of Care [facility]) - Progression Note    Patient Details  Name: Praneel Haisley MRN: [id number] Date of Birth: 1951-09-06  [facility] CM/SW Contact  Izola Price, RN Phone Number: 04/04/2021, 1:57 PM  Clinical Narrative:  Niece Cassell Smiles, [phone]) returned CM call. Discussed issues she was experiencing at current SNF. Does not want patient to be returned there.   She is only caretaker of her uncle and now both of her parents have ongoing acute medical issues and she tries to drive to [place] to visit/help 3 times a week but it is all wearing her down and causing her own health issues, she said. She has discussed issues with CEO of facility but she feels nothing has changed regarding his care and that he gets worse each time he is discharge from hospital.   She would like to seek another facility in [place], close to her home so she can be present and participate in uncle's care more easily. She requested [facility]. CM explained process and it may take several days, and that TOC would follow up with her.   She was very appreciative of someone listening to her issues and responsibilities towards her uncle and efforts to help with a different facility.   Simmie Davies RN CM 2:04 pm. 04/04/21 [phone].          Expected Discharge Plan and Services                                                 Social Determinants of Health (SDOH) Interventions    Readmission Risk Interventions No flowsheet data found.

## 2021-04-04 NOTE — TOC Progression Note (Signed)
Transition of Care [facility]) - Progression Note    Patient Details  Name: Samuel Shelton MRN: [id number] Date of Birth: Feb 22, 1951  [facility]) CM/SW Contact  Samuel Price, RN Phone Number: 04/04/2021, 10:35 AM  Clinical Narrative:  1030 am. Attempted to contact Samuel Shelton by listed phone number [phone] with no answer and no VM option. Called patient room with no answer. Will continue to try to connect with Samuel Shelton to discuss issues/options. Samuel Davies RN CM 8916 am 04/04/21. [phone] Work mobile phone.           Expected Discharge Plan and Services                                                 Social Determinants of Health (SDOH) Interventions    Readmission Risk Interventions No flowsheet data found.

## 2021-04-04 NOTE — Progress Notes (Signed)
Call from Dr. Posey Pronto to change IV cephalosporin and start oral cephalosporin. Patient with UTI and suprapubic catheter. Urine culture indicated Proteus Mirabilis. Ordered Omnicef 300 mg every 12 hours for 14 days.

## 2021-04-05 DIAGNOSIS — F79 Unspecified intellectual disabilities: Secondary | ICD-10-CM

## 2021-04-05 DIAGNOSIS — N390 Urinary tract infection, site not specified: Secondary | ICD-10-CM

## 2021-04-05 LAB — CULTURE, URINE COMPREHENSIVE

## 2021-04-05 MED ORDER — CEFPODOXIME PROXETIL 200 MG PO TABS
200.0000 mg | ORAL_TABLET | Freq: Two times a day (BID) | ORAL | Status: DC
  Administered 2021-04-05 – 2021-04-06 (×2): 200 mg via ORAL
  Filled 2021-04-05 (×3): qty 1

## 2021-04-05 NOTE — Progress Notes (Signed)
  Speech Language Pathology Treatment: Dysphagia  Patient Details Name: Samuel Shelton MRN: [id number] DOB: [birth year] Today's Date: 04/05/2021 Time: 1350-1410 SLP Time Calculation (min) (ACUTE ONLY): 20 min  Assessment / Plan / Recommendation Clinical Impression  Pt was sleeping when SLP entered room but he was able to arouse when spoken too. Pt nodded his head yes in response to question "are you thirsty?" Pt consumed consecutive sips of thin liquids via straw without any overt s/s of aspiration. When consuming minimal trials of puree, pt continues to demonstrate decreased oral motor coordination. Given this and his inability to follow directions on command, recommend pt remain on dysphagia 1 diet with thin liquids via straw with medicine crushed in puree. Will defer further diet advancement to next venue of care.    HPI HPI: Samuel Shelton is a 70 y.o. male with past medical history of hypertension, hyperlipidemia, CKD, stroke, and intellectual disability who presents to the ED for altered mental status.  Patient has a chronic suprapubic catheter that was replaced in the urology office 1 week ago.  He now arrives from nursing facility after the catheter has apparently not been draining any urine for the past 2 days.  Patient was evaluated again in the urology office where suprapubic catheter was exchanged and patient reportedly drained 800 cc of purulent urine.  He was subsequently sent to the ED for further evaluation.  Patient is minimally verbal at baseline, unable to provide any history today due to somnolence.      SLP Plan  All goals met       Recommendations  Diet recommendations: Dysphagia 1 (puree);Thin liquid Liquids provided via: Straw Medication Administration: Crushed with puree Supervision: Staff to assist with self feeding;Full supervision/cueing for compensatory strategies Compensations: Minimize environmental distractions;Slow rate;Small sips/bites Postural Changes  and/or Swallow Maneuvers: Seated upright 90 degrees;Upright 30-60 min after meal                Oral Care Recommendations: Oral care BID;Oral care before and after PO Follow up Recommendations: Skilled Nursing facility SLP Visit Diagnosis: Dysphagia, unspecified (R13.10) Plan: All goals met       GO               Shenandoah Yeats B. Rutherford Nail M.S., CCC-SLP, [place] Office [phone]  Stormy Fabian 04/05/2021, 2:19 PM

## 2021-04-05 NOTE — NC FL2 (Signed)
[place] LEVEL OF CARE SCREENING TOOL     IDENTIFICATION  Patient Name: Samuel Shelton Birthdate: Mar 16, 1951 Sex: male Admission Date (Current Location): 03/31/2021  [place] and [place] Number:  Engineering geologist and Address:  [facility], [address], [place], [place] [address]      Provider Number: [id number]  Attending Physician Name and Address:  Fritzi Mandes, MD  Relative Name and Phone Number:  Randell Patient [phone]    Current Level of Care: Hospital Recommended Level of Care: [place] Prior Approval Number:    Date Approved/Denied:   PASRR Number: [id number]  Discharge Plan: SNF    Current Diagnoses: Patient Active Problem List   Diagnosis Date Noted  . Severe sepsis with acute organ dysfunction ([place]) 03/31/2021  . Acute encephalopathy 08/04/2020  . Right sided weakness 08/03/2020  . CKD (chronic kidney disease), stage III ([place]) 08/03/2020  . Acute lower UTI 08/03/2020  . Gross hematuria   . Oropharyngeal dysphagia   . Acute gastritis without hemorrhage   . Postpyloric ulcer   . Protein-calorie malnutrition, severe ([place])   . Palliative care by specialist   . DNR (do not resuscitate) discussion   . Acute hypernatremia 01/08/2020  . Hypercalcemia 01/08/2020  . Acute metabolic encephalopathy 81/85/6314  . COVID-19 virus infection 01/08/2020  . Acute renal failure ([place]) 01/08/2020  . Dehydration 01/08/2020  . Elevated troponin 01/08/2020  . Lactic acidosis 01/08/2020  . Altered mental status   . Hyperlipidemia 01/29/2019  . Family hx-stroke 01/29/2019  . BPH (benign prostatic hyperplasia) 01/29/2019  . Stroke (cerebrum) ([place]) 01/28/2019  . Edema leg 08/20/2014  . Chronic mental disorder 08/20/2014  . HTN (hypertension) 08/20/2014  . Intellectual disability 08/20/2014    Orientation RESPIRATION BLADDER Height & Weight     Self  Normal Indwelling catheter (chronic suprapubic catheter)  Weight: 68 kg Height:  5\' 5"  (165.1 cm)  BEHAVIORAL SYMPTOMS/MOOD NEUROLOGICAL BOWEL NUTRITION STATUS      Incontinent Diet (Dysphagia 1 (Puree);Thin liquid)  AMBULATORY STATUS COMMUNICATION OF NEEDS Skin   Extensive Assist Does not communicate (does not follow commands in neuro checks)                         Personal Care Assistance Level of Assistance  Total care (CVA deficits)       Total Care Assistance: Maximum assistance   Functional Limitations Info  Speech (non verbal)     Speech Info: Impaired    SPECIAL CARE FACTORS FREQUENCY                       Contractures Contractures Info: Present    Additional Factors Info  Allergies Code Status Info: DNR Allergies Info: KNDA           Current Medications (04/05/2021):  This is the current hospital active medication list Current Facility-Administered Medications  Medication Dose Route Frequency Provider Last Rate Last Admin  . acetaminophen (TYLENOL) tablet 650 mg  650 mg Oral Q6H PRN Ralene Muskrat B, MD       Or  . acetaminophen (TYLENOL) suppository 650 mg  650 mg Rectal Q6H PRN Ralene Muskrat B, MD   650 mg at 04/01/21 1307  . albuterol (PROVENTIL) (2.5 MG/3ML) 0.083% nebulizer solution 2.5 mg  2.5 mg Nebulization Q2H PRN Sreenath, Sudheer B, MD      . amLODipine (NORVASC) tablet 5 mg  5 mg Oral Daily Posey Pronto, Sona,  MD   5 mg at 04/04/21 1044  . aspirin tablet 325 mg  325 mg Oral Daily Fritzi Mandes, MD   325 mg at 04/04/21 1045  . cefdinir (OMNICEF) capsule 300 mg  300 mg Oral Q12H Fritzi Mandes, MD      . enoxaparin (LOVENOX) injection 40 mg  40 mg Subcutaneous Q24H Ralene Muskrat B, MD   40 mg at 04/04/21 2123  . feeding supplement (BOOST / RESOURCE BREEZE) liquid 1 Container  1 Container Oral BID BM Fritzi Mandes, MD   1 Container at 04/03/21 1357  . finasteride (PROSCAR) tablet 5 mg  5 mg Oral Daily Fritzi Mandes, MD   5 mg at 04/04/21 1044  . ondansetron (ZOFRAN) tablet 4 mg  4 mg Oral Q6H PRN  Ralene Muskrat B, MD       Or  . ondansetron (ZOFRAN) injection 4 mg  4 mg Intravenous Q6H PRN Sreenath, Sudheer B, MD      . pantoprazole sodium (PROTONIX) 40 mg/20 mL oral suspension 40 mg  40 mg Oral Daily Fritzi Mandes, MD   40 mg at 04/03/21 1105  . sertraline (ZOLOFT) tablet 25 mg  25 mg Oral Daily Fritzi Mandes, MD   25 mg at 04/04/21 1044  . [START ON 04/06/2021] Vitamin D (Ergocalciferol) (DRISDOL) capsule 50,000 Units  50,000 Units Oral Q Demetrius Charity, MD         Discharge Medications: Please see discharge summary for a list of discharge medications.  Relevant Imaging Results:  Relevant Lab Results:   Additional Information SS#: [id number]  Su Hilt, RN

## 2021-04-05 NOTE — Progress Notes (Signed)
[name]    MR#:  [id number]  DATE OF BIRTH:  1951/01/21  SUBJECTIVE:  Non verbal  His been tolerating juice coffee and other drinks. More alert. No family in the room remains afebrile REVIEW OF SYSTEMS:   Review of Systems  Unable to perform ROS: Mental status change   Tolerating Diet:yes Tolerating PT: bed bound  DRUG ALLERGIES:  No Known Allergies  VITALS:  Blood pressure (!) 152/96, pulse 87, temperature 97.9 F (36.6 C), resp. rate 17, height _0  (1.651 m), weight 68 kg, SpO2 100 %.  PHYSICAL EXAMINATION:   Physical Exam General exam: No acute distress, chronically ill Respiratory system: Lungs clear.  Normal work of breathing.  Room air Cardiovascular system: S1-S2, regular rate and rhythm, no murmurs Gastrointestinal system: Soft, nontender, nondistended, normal bowel sounds. Chronic suprapubic catheter + Central nervous system: Post CVA residual deficits, oriented x0 Extremities: Symmetrically decreased power bilateral upper and lower extremities Skin: Scattered excoriations Psychiatry: Not able to be assessed   LABORATORY PANEL:  CBC Recent Labs  Lab 04/01/21 0540  WBC 5.9  HGB 11.7*  HCT 38.1*  PLT 204    Chemistries  Recent Labs  Lab 04/01/21 0540 04/03/21 0623  NA 150* 143  K 3.5 3.3*  CL 116* 112*  CO2 25 24  GLUCOSE 122* 106*  BUN 31* 14  CREATININE 1.00 0.96  CALCIUM 9.0 8.5*  AST 27  --   ALT 24  --   ALKPHOS 47  --   BILITOT 1.0  --    Cardiac Enzymes No results for input(s): TROPONINI in the last 168 hours. RADIOLOGY:  No results found. ASSESSMENT AND PLAN:  70 y.o.malewith medical history significant ofCVA with significant residual deficit, chronic developmental delay, GERD, hypertension who lives full-time at assisted living facility, [facility]. Note that time my evaluation patient is altered from baseline and is unable to provide any history.  Patient was brought to the emergency department today because nursing facility staff noted his suprapubic catheter to not be draining over the past 2 days and noted leaking around suprapubic insertion site. UA and urine culture was sent from skilled nursing facility but not started on antibiotics.  Catheter associated UTI, POA Severe sepsis secondary to above Acute metabolic encephalopathy secondary to above Proteus Mirabilis bacteremia --sepsis resolved --Baseline mental status is unclear but likely poor--more alert today --Sepsis criteria met with AKI, encephalopathy, tachycardia, fever --Presumed source of infection is urine --CT abdomen pelvis: Pyelonephritis without abscess --IV Rocephin --SLP consult, recommending Dysphagia 1 (Puree);Thin liquid   -4/9--repeat BC--so far neg --4/10--change to po Vantin  History of CVA with residual deficit History of suprapubic catheter --Baseline status is unclear --Suprapubic catheter changed4/6in urology office --Continue suprapubic catheter  Acute kidney injury, resolved Hypernatremia --AKI are likely related to sepsis --Hypernatremia also related to sepsis/underlying dehydration --AKI resolved --IV D5 half-normal saline 100 cc/h--S Sodium 143--d/ced  IVF  Essential hypertension -- not on any BP meds at home --will start Norvasc 5 mg  Chronic intellectual disability --No acute issues  Chronic debility  CODE STATUS switched to DNR.  Palliative care also consulted and MOST form completed. Recommend outpatient palliative follow-up at time of discharge  CT head neg for CVA, chronic Small vessel dz  DVT prophylaxis: SQ Lovenox Code Status: DNR Family Communication: Niece at bedside 4/9 Disposition Plan: Status is: Inpatient  Remains inpatient appropriate because:Inpatient level of care appropriate due to severity  of illness   Dispo: The patient is from: SNF  Anticipated d/c is to: SNF   Patient currently is medically stable to d/c once new LTC bed is available              Difficult to place patient No    4/10--TOC consult--niece wants to discuss about other facility--I have left a msg for her to call the social worker 4/11--per TOC LTC bed search initiated   TOTAL TIME TAKING CARE OF THIS PATIENT: *25* minutes.  >50% time spent on counselling and coordination of care  Note: This dictation was prepared with Dragon dictation along with smaller phrase technology. Any transcriptional errors that result from this process are unintentional.  Fritzi Mandes M.D    [facility]   CC: Primary care physician; Bonnita Nasuti, MDPatient ID: Samuel Shelton, male   DOB: [birth year], 70 y.o.   MRN: [id number]

## 2021-04-05 NOTE — Evaluation (Signed)
Physical Therapy Evaluation Patient Details Name: Samuel Shelton MRN: [id number] DOB: 1951/11/15 Today's Date: 04/05/2021   History of Present Illness  Pt is a 70 y.o. male presenting to hospital 4/6 with AMS and chronic suprapubic catheter not draining any urine for 2 days; reported 800 cc drained purulent urine at urology office and sent to ED for evaluation.  Pt admitted with catheter associated UTI, severe sepsis, acute metabolic encephalopathy, AKI, and hypernatremia.  PMH includes chronic suprapubic catheter, htn, HLD, CKD, stroke, intellectual disability, phimosis, R sided weakness, COVID-19.  Per chart pt minimally verbal at baseline.  Clinical Impression  PT/OT co-evaluation performed.  No family present during session so therapist called pt's niece to verify prior level of function information.  Prior to hospital admission, pt has had a progressive decline in mobility for the past 2 years (see below for details) and most recently requiring increased assist with bed mobility and transfers; full time resident at [facility].  Pt able to verbalize his name and his DOB (although pt not accurate on DOB) but otherwise non-verbal during session; pt communicates by shaking/nodding head (but appears inconsistent) and hand gestures.  Currently pt is 2 assist with bed mobility and mod assist for sitting balance d/t posterior R lean.  Pt grabbing onto therapists hands and wrists during activity (seemed more to improve pt's comfort with activities; able to redirect pt with vc's and tactile cues).  Pt would benefit from skilled PT trial to address noted impairments and functional limitations (see below for any additional details).  Upon hospital discharge, pt would benefit from physical therapy trial at LTC facility (to decrease caregiver support needs and maximize pt's functional mobility).    Follow Up Recommendations Other (comment) (physical therapy trial at LTC facility)    Equipment  Recommendations       Recommendations for Other Services       Precautions / Restrictions Precautions Precautions: Fall Precaution Comments: Aspiration; suprapubic catheter Restrictions Weight Bearing Restrictions: No      Mobility  Bed Mobility Overal bed mobility: Needs Assistance Bed Mobility: Supine to Sit;Sit to Supine;Rolling Rolling: Mod assist;Max assist   Supine to sit: Mod assist;Max assist;+2 for physical assistance;HOB elevated Sit to supine: Mod assist;Max assist;+2 for physical assistance;HOB elevated   General bed mobility comments: assist for trunk and B LE's; vc's for technique    Transfers                 General transfer comment: Deferred d/t assist required for sitting balance  Ambulation/Gait                Stairs            Wheelchair Mobility    Modified Rankin (Stroke Patients Only)       Balance Overall balance assessment: Needs assistance Sitting-balance support: Bilateral upper extremity supported;Feet supported Sitting balance-Leahy Scale: Poor Sitting balance - Comments: mod assist for sitting balance d/t posterior R lean                                     Pertinent Vitals/Pain Pain Assessment: Faces Faces Pain Scale: No hurt Pain Intervention(s): Limited activity within patient's tolerance;Monitored during session;Repositioned     Home Living Family/patient expects to be discharged to:: Skilled nursing facility                 Additional Comments: Residing at [facility]  of [place] (full time resident)    Prior Function Level of Independence: Needs assistance   Gait / Transfers Assistance Needed: Per pt's niece:  Decline in function last 2 years.  Pt able to walk 2 years ago; using L LE to assist with transfers about 1 year ago; R LE contracted for about the last year; Dec 2021/January 2022 pt able to sit up on edge of bed and sit on own but required assist for transfer to chair; has  had progressive decline in status since then (niece reports pt most likely having assist for transfers to w/c or using hoyer lift); typically able to sit in w/c on own--not leaning to side (L LE in legrest and R LE contracted but can stay off floor if hips far enough back in w/c)  ADL's / Homemaking Assistance Needed: Assist for ADL's and IADL's  Comments: Able to state name but otherwise non-verbal.  Communicates by shaking/nodding head (but appears inconsistent) and hand gestures.     Hand Dominance        Extremity/Trunk Assessment   Upper Extremity Assessment Upper Extremity Assessment: Defer to OT evaluation (strong B hand grip strength)    Lower Extremity Assessment Lower Extremity Assessment: RLE deficits/detail;LLE deficits/detail;Difficult to assess due to impaired cognition RLE Deficits / Details: R LE hip externally rotated with R knee flexed (pt's R foot under L LE)--pt with contraction for about 1 year per pt's niece LLE Deficits / Details: at least 3/5 AROM    Cervical / Trunk Assessment Cervical / Trunk Assessment: Other exceptions Cervical / Trunk Exceptions: forward head/shoulders  Communication   Communication: Expressive difficulties  Cognition Arousal/Alertness: Awake/alert Behavior During Therapy: Anxious Overall Cognitive Status: No family/caregiver present to determine baseline cognitive functioning                                 General Comments: Oriented to name (not DOB).  Pt did not verbalize any other information.      General Comments   Nursing cleared pt for participation in physical therapy.  Pt agreeable to PT/OT session.    Exercises     Assessment/Plan    PT Assessment Patient needs continued PT services  PT Problem List Decreased strength;Decreased activity tolerance;Decreased balance;Decreased mobility       PT Treatment Interventions DME instruction;Functional mobility training;Therapeutic activities;Therapeutic  exercise;Balance training;Patient/family education    PT Goals (Current goals can be found in the Care Plan section)  Acute Rehab PT Goals Patient Stated Goal: to improve mobility PT Goal Formulation: With family Time For Goal Achievement: 04/19/21 Potential to Achieve Goals: Fair    Frequency Min 2X/week (PT trial)   Barriers to discharge Decreased caregiver support Level of assist    Co-evaluation PT/OT/SLP Co-Evaluation/Treatment: Yes Reason for Co-Treatment: To address functional/ADL transfers;For patient/therapist safety;Necessary to address cognition/behavior during functional activity PT goals addressed during session: Mobility/safety with mobility OT goals addressed during session: ADL's and self-care       AM-PAC PT "6 Clicks" Mobility  Outcome Measure Help needed turning from your back to your side while in a flat bed without using bedrails?: A Lot Help needed moving from lying on your back to sitting on the side of a flat bed without using bedrails?: Total Help needed moving to and from a bed to a chair (including a wheelchair)?: Total Help needed standing up from a chair using your arms (e.g., wheelchair or bedside chair)?: Total Help  needed to walk in hospital room?: Total Help needed climbing 3-5 steps with a railing? : Total 6 Click Score: 7    End of Session   Activity Tolerance: Patient tolerated treatment well Patient left: in bed;with call bell/phone within reach;with bed alarm set;Other (comment) (pillows positioned around pt for comfort and for pressure relieving) Nurse Communication: Mobility status;Precautions PT Visit Diagnosis: Other abnormalities of gait and mobility (R26.89);Muscle weakness (generalized) (M62.81)    Time: 1127-1202 PT Time Calculation (min) (ACUTE ONLY): 35 min   Charges:   PT Evaluation $PT Eval Low Complexity: 1 Low PT Treatments $Therapeutic Activity: 8-22 mins       Leitha Bleak, PT 04/05/21, 12:59 PM

## 2021-04-05 NOTE — Care Management Important Message (Signed)
Important Message  Patient Details  Name: Samuel Shelton MRN: [id number] Date of Birth: 02-16-1951   Medicare Important Message Given:  Yes  Initial Medicare IM signed by [name], niece. Copy left with niece for reference in room.     Dannette Barbara 04/05/2021, 3:24 PM

## 2021-04-05 NOTE — TOC Progression Note (Signed)
Transition of Care [facility]) - Progression Note    Patient Details  Name: Samuel Shelton MRN: [id number] Date of Birth: Jun 16, 1951  Transition of Care [facility]) CM/SW [place], RN Phone Number: 04/05/2021, 8:53 AM  Clinical Narrative:   Sent patient out for bedsearch for a long term bed, reached out to the facilities requesting to make a bed offer, awiaiting responce     Barriers to Discharge: Continued Medical Work up  Expected Discharge Plan and Services                                                 Social Determinants of Health (SDOH) Interventions    Readmission Risk Interventions No flowsheet data found.

## 2021-04-05 NOTE — TOC Progression Note (Signed)
Transition of Care [facility]) - Progression Note    Patient Details  Name: Samuel Shelton MRN: [id number] Date of Birth: 03/31/1951  Transition of Care [facility]) CM/SW [place], RN Phone Number: 04/05/2021, 3:24 PM  Clinical Narrative:   Talked to the niece and reviewed the bed offers for long term bed, She stated she is going to look them up and she will let me know what she wants to do     Barriers to Discharge: Continued Medical Work up  Expected Discharge Plan and Services                                                 Social Determinants of Health (SDOH) Interventions    Readmission Risk Interventions No flowsheet data found.

## 2021-04-05 NOTE — Evaluation (Signed)
Occupational Therapy Evaluation Patient Details Name: Samuel Shelton MRN: [id number] DOB: 10-11-1951 Today's Date: 04/05/2021    History of Present Illness Pt is a 70 y.o. male presenting to hospital 4/6 with AMS and chronic suprapubic catheter not draining any urine for 2 days; reported 800 cc drained purulent urine at urology office and sent to ED for evaluation.  Pt admitted with catheter associated UTI, severe sepsis, acute metabolic encephalopathy, AKI, and hypernatremia.  PMH includes chronic suprapubic catheter, htn, HLD, CKD, stroke, intellectual disability, phimosis, R sided weakness, COVID-19.  Per chart pt minimally verbal at baseline.   Clinical Impression   PT/OT co-evaluation performed.  No family present during session. Per PT's conversation with niece by phone, prior to hospital admission, pt has had a progressive decline in mobility for the past 2 years (see below for details) and most recently requiring increased assist with bed mobility and transfers; full time resident at [facility].  Pt able to verbalize his name and his DOB (although pt not accurate on DOB) but otherwise non-verbal during session; pt communicates by shaking/nodding head (but appears inconsistent) and hand gestures.  Currently pt is 2+ assist with bed mobility and mod assist for sitting balance d/t posterior R lean. +2 assist for bed level toileting. Pt grabbing onto therapists hands and wrists during activity (seemed more to improve pt's comfort with activities; able to redirect pt with vc's and tactile cues). Pillows positioned to support skin integrity and minimize risk of injury and increased flexion contracture to RLE. Pt would benefit from skilled OT trial to address noted impairments and functional limitations (see below for any additional details).  Upon hospital discharge, pt would benefit from skilled OT trial at LTC facility (to decrease caregiver support needs and maximize pt's functional  mobility).    Follow Up Recommendations  Other (comment) (trial OT at long term care)    Equipment Recommendations  Other (comment) (TBD at next venue of care, may benefit from wheelchair evaluation)    Recommendations for Other Services       Precautions / Restrictions Precautions Precautions: Fall Precaution Comments: Aspiration; suprapubic catheter Restrictions Weight Bearing Restrictions: No      Mobility Bed Mobility Overal bed mobility: Needs Assistance Bed Mobility: Supine to Sit;Sit to Supine;Rolling Rolling: Mod assist;Max assist   Supine to sit: Mod assist;Max assist;+2 for physical assistance;HOB elevated Sit to supine: Mod assist;Max assist;+2 for physical assistance;HOB elevated   General bed mobility comments: assist for trunk and B LE's; vc's for technique    Transfers                 General transfer comment: Deferred d/t assist required for sitting balance    Balance Overall balance assessment: Needs assistance Sitting-balance support: Bilateral upper extremity supported;Feet supported Sitting balance-Leahy Scale: Poor Sitting balance - Comments: mod assist for sitting balance d/t posterior R lean                                   ADL either performed or assessed with clinical judgement   ADL Overall ADL's : Needs assistance/impaired                                       General ADL Comments: Pt currently requires MAX A for UB ADL, TOTAL A for LB ADL from bed  level; demo'd difficulty with initiating and sequencing washing his face even with attempted hand over hand assist     Vision   Additional Comments: pt able to visually track and visually attend L and R     Perception     Praxis      Pertinent Vitals/Pain Pain Assessment: Faces Faces Pain Scale: Hurts little more Pain Location: with attempts to provide gentle stretch to RLE and position pillows for improved comfort and skin integrity Pain  Descriptors / Indicators: Grimacing Pain Intervention(s): Limited activity within patient's tolerance;Monitored during session;Repositioned     Hand Dominance     Extremity/Trunk Assessment Upper Extremity Assessment Upper Extremity Assessment: RUE deficits/detail;LUE deficits/detail;Difficult to assess due to impaired cognition RUE Deficits / Details: Pt demo's tendency to keep RUE in flexed position, good grip although some difficulty with terminating grip, resistence noted with shoulder ROM LUE Deficits / Details: resistence noted with shoulder ROM, good grip   Lower Extremity Assessment Lower Extremity Assessment: Defer to PT evaluation;RLE deficits/detail;LLE deficits/detail;Difficult to assess due to impaired cognition RLE Deficits / Details: R LE hip externally rotated with R knee flexed (pt's R foot under L LE)--pt with contraction for about 1 year per pt's niece LLE Deficits / Details: at least 3/5 AROM   Cervical / Trunk Assessment Cervical / Trunk Assessment: Other exceptions Cervical / Trunk Exceptions: forward head/shoulders   Communication Communication Communication: Expressive difficulties   Cognition Arousal/Alertness: Awake/alert Behavior During Therapy: Anxious Overall Cognitive Status: No family/caregiver present to determine baseline cognitive functioning                                 General Comments: Oriented to name (not DOB).  Pt did not verbalize any other information. Able to intermittently nod/shake head to simple yes/no questions, however, unclear as to accuracy   General Comments       Exercises Other Exercises Other Exercises: bed level toileting requiring MAX+2 assist to perform, bed mobility, MAX to TOTAL assist for washing face Other Exercises: TOTAL A for positioning of pillows to support comfort, skin integrity, and mininimize flextion contracture   Shoulder Instructions      Home Living Family/patient expects to be  discharged to:: Skilled nursing facility                                 Additional Comments: Residing at [facility] (full time resident)      Prior Functioning/Environment Level of Independence: Needs assistance  Gait / Transfers Assistance Needed: Per pt's niece:  Decline in function last 2 years.  Pt able to walk 2 years ago; using L LE to assist with transfers about 1 year ago; R LE contracted for about the last year; Dec 2021/January 2022 pt able to sit up on edge of bed and sit on own but required assist for transfer to chair; has had progressive decline in status since then (niece reports pt most likely having assist for transfers to w/c or using hoyer lift); typically able to sit in w/c on own--not leaning to side (L LE in legrest and R LE contracted but can stay off floor if hips far enough back in w/c) ADL's / Homemaking Assistance Needed: Assist for ADL's and IADL's Communication / Swallowing Assistance Needed: Mostly non-verbal Comments: Able to state name but otherwise non-verbal.  Communicates by shaking/nodding head (but appears  inconsistent) and hand gestures.        OT Problem List: Decreased strength;Decreased coordination;Pain;Decreased range of motion;Decreased cognition;Decreased activity tolerance;Impaired balance (sitting and/or standing);Decreased knowledge of use of DME or AE;Impaired UE functional use      OT Treatment/Interventions: Self-care/ADL training;Therapeutic activities;Therapeutic exercise;Cognitive remediation/compensation;Neuromuscular education;DME and/or AE instruction;Patient/family education;Balance training;Manual therapy    OT Goals(Current goals can be found in the care plan section) Acute Rehab OT Goals Patient Stated Goal: to improve mobility OT Goal Formulation: Patient unable to participate in goal setting Time For Goal Achievement: 04/19/21 Potential to Achieve Goals: Good  OT Frequency: Min 1X/week   Barriers  to D/C:            Co-evaluation PT/OT/SLP Co-Evaluation/Treatment: Yes Reason for Co-Treatment: Necessary to address cognition/behavior during functional activity;For patient/therapist safety;To address functional/ADL transfers PT goals addressed during session: Mobility/safety with mobility OT goals addressed during session: ADL's and self-care      AM-PAC OT "6 Clicks" Daily Activity     Outcome Measure Help from another person eating meals?: A Lot Help from another person taking care of personal grooming?: A Lot Help from another person toileting, which includes using toliet, bedpan, or urinal?: Total Help from another person bathing (including washing, rinsing, drying)?: Total Help from another person to put on and taking off regular upper body clothing?: Total Help from another person to put on and taking off regular lower body clothing?: Total 6 Click Score: 8   End of Session Nurse Communication: Mobility status  Activity Tolerance: Patient tolerated treatment well Patient left: in bed;with call bell/phone within reach;with bed alarm set;Other (comment) (pillows positioned to support comfort, skin integrity)  OT Visit Diagnosis: Other abnormalities of gait and mobility (R26.89)                Time: 1127-1202 OT Time Calculation (min): 35 min Charges:  OT General Charges $OT Visit: 1 Visit OT Evaluation $OT Eval Moderate Complexity: 1 Mod OT Treatments $Self Care/Home Management : 8-22 mins  Hanley Hays, MPH, MS, OTR/L ascom [phone] 04/05/21, 1:51 PM

## 2021-04-06 LAB — RESP PANEL BY RT-PCR (FLU A&B, COVID) ARPGX2
Influenza A by PCR: NEGATIVE
Influenza B by PCR: NEGATIVE
SARS Coronavirus 2 by RT PCR: NEGATIVE

## 2021-04-06 MED ORDER — AMLODIPINE BESYLATE 5 MG PO TABS: 5.0000 mg | ORAL_TABLET | Freq: Every day | ORAL | 0 refills | Status: DC

## 2021-04-06 MED ORDER — CEFPODOXIME PROXETIL 200 MG PO TABS: 200.0000 mg | ORAL_TABLET | Freq: Two times a day (BID) | ORAL | 0 refills | Status: DC

## 2021-04-06 NOTE — TOC Progression Note (Addendum)
Transition of Care [facility]) - Progression Note    Patient Details  Name: Samuel Shelton MRN: [id number] Date of Birth: August 18, 1951  [facility] CM/SW [place], RN Phone Number: 04/06/2021, 2:01 PM  Clinical Narrative:    [facility] will not be able to transport, Called [facility] and requested safe transport. He will need a stretcher and is non ambulatory, [facility] will call back when they find a safe transport option     Barriers to Discharge: Continued Medical Work up  Expected Discharge Plan and Services           Expected Discharge Date: 04/06/21                                     Social Determinants of Health (SDOH) Interventions    Readmission Risk Interventions No flowsheet data found.

## 2021-04-06 NOTE — Plan of Care (Signed)
Patient is developmentally delayed unable to attend to activities of health issue that may arise

## 2021-04-06 NOTE — Discharge Summary (Addendum)
[facility] NAME: Samuel Shelton    MR#:  [id number]  DATE OF BIRTH:  [birth year]  DATE OF ADMISSION:  03/31/2021 ADMITTING PHYSICIAN: Sidney Ace, MD  DATE OF DISCHARGE: 04/06/2021  PRIMARY CARE PHYSICIAN: Bonnita Nasuti, MD    ADMISSION DIAGNOSIS:  Complicated UTI (urinary tract infection) [N39.0] Severe sepsis with acute organ dysfunction ([place]) [A41.9, R65.20] Altered mental status, unspecified altered mental status type [R41.82] Sepsis without acute organ dysfunction, due to unspecified organism ([place]) [A41.9]  DISCHARGE DIAGNOSIS:  Proteus Mirabilis sepsis source urine Chronic Suprapubic catheter with complicated UTI AKI--resolved SECONDARY DIAGNOSIS:   Past Medical History:  Diagnosis Date  . Anemia   . Chronic mental illness   . Edema of both legs   . Elevated PSA   . Elevated PSA   . GERD (gastroesophageal reflux disease)   . Hypertension   . Mental retardation   . Phimosis     HOSPITAL COURSE:   70 y.o.malewith medical history significant ofCVA with significant residual deficit, chronic developmental delay, GERD, hypertension who lives full-time at assisted living facility, [facility]. Note that time my evaluation patient is altered from baseline and is unable to provide any history. Patient was brought to the emergency department today because nursing facility staff noted his suprapubic catheter to not be draining over the past 2 days and noted leaking around suprapubic insertion site. UA and urine culture was sent from skilled nursing facility but not started on antibiotics.  Catheter associated UTI, POA Severe sepsis secondary to above--now resolved Acute metabolic encephalopathy secondary to above Proteus Mirabilis bacteremia --sepsis resolved --Sepsis criteria met with AKI, encephalopathy, tachycardia, fever --Presumed source of infection is urine --CT abdomen pelvis: Pyelonephritis without  abscess --IV Rocephin--change to po abxs --SLP consult, recommending Dysphagia 1 (Puree);Thin liquid  -4/9--repeat BC--so far neg --4/10--change to po Vantin --4/12--doing well--eating better  History of CVA with residual deficit History of suprapubic catheter --Baseline status is unclear --Suprapubic catheter changed4/6in urology office --Continue suprapubic catheter  Acute kidney injury, resolved Hypernatremia --AKI are likely related to sepsis --Hypernatremia also related to sepsis/underlying dehydration --AKI resolved after IVF  Essential hypertension -- not on any BP meds at home --now on po Norvasc 5 mg  Chronic intellectual disability --No acute issues  Chronic debility --CODE STATUS switched to DNR. --Palliative care also consulted and MOST form completed. --Recommend outpatient palliative follow-up at time of discharge.    CT head neg for CVA, chronic Small vessel dz  DVT prophylaxis:SQ Lovenox Code Status:DNR Family Communication:Niece  Disposition Plan:Status is: Inpatient  Remains inpatient appropriate because:Inpatient level of care appropriate due to severity of illness   Dispo: The patient is from:SNF Anticipated d/c is to:SNF Patient currently is medically stable to d/c once new LTC bed is available Difficult to place patient No    CONSULTS OBTAINED:    DRUG ALLERGIES:  No Known Allergies  DISCHARGE MEDICATIONS:   Allergies as of 04/06/2021   No Known Allergies     Medication List    TAKE these medications   acetaminophen 500 MG tablet Commonly known as: TYLENOL Take 1,000 mg by mouth in the morning and at bedtime.   amLODipine 5 MG tablet Commonly known as: NORVASC Take 1 tablet (5 mg total) by mouth daily. Start taking on: April 07, 2021   aspirin 325 MG tablet Take 1 tablet (325 mg total) by mouth daily.   cefpodoxime 200 MG tablet Commonly known as: VANTIN Take  1 tablet (200 mg total) by mouth every 12 (twelve) hours.   finasteride 5 MG tablet Commonly known as: PROSCAR Take 1 tablet (5 mg total) by mouth daily.   pantoprazole sodium 40 mg/20 mL Pack Commonly known as: PROTONIX Take 40 mg by mouth daily.   sertraline 25 MG tablet Commonly known as: ZOLOFT Take 25 mg by mouth daily.   Vitamin D (Ergocalciferol) 1.25 MG (50000 UNIT) Caps capsule Commonly known as: DRISDOL Take 50,000 Units by mouth every Tuesday.       If you experience worsening of your admission symptoms, develop shortness of breath, life threatening emergency, suicidal or homicidal thoughts you must seek medical attention immediately by calling 911 or calling your MD immediately  if symptoms less severe.  You Must read complete instructions/literature along with all the possible adverse reactions/side effects for all the Medicines you take and that have been prescribed to you. Take any new Medicines after you have completely understood and accept all the possible adverse reactions/side effects.   Please note  You were cared for by a hospitalist during your hospital stay. If you have any questions about your discharge medications or the care you received while you were in the hospital after you are discharged, you can call the unit and asked to speak with the hospitalist on call if the hospitalist that took care of you is not available. Once you are discharged, your primary care physician will handle any further medical issues. Please note that NO REFILLS for any discharge medications will be authorized once you are discharged, as it is imperative that you return to your primary care physician (or establish a relationship with a primary care physician if you do not have one) for your aftercare needs so that they can reassess your need for medications and monitor your lab values. Today   SUBJECTIVE   mentation back to baseline  VITAL SIGNS:  Blood pressure (!) 152/95,  pulse 83, temperature 98.7 F (37.1 C), resp. rate 14, height _0  (1.651 m), weight 68 kg, SpO2 99 %.  I/O:    Intake/Output Summary (Last 24 hours) at 04/06/2021 1040 Last data filed at 04/06/2021 0500 Gross per 24 hour  Intake 200 ml  Output 1050 ml  Net -850 ml    PHYSICAL EXAMINATION:   General exam:No acute distress, chronically ill Respiratory system:Lungs clear. Normal work of breathing. Room air Cardiovascular system:S1-S2, regular rate and rhythm, no murmurs Gastrointestinal system:Soft, nontender, nondistended, normal bowel sounds. Chronic suprapubic catheter + Central nervous system:Post CVA residual deficits, oriented x0 Extremities:Symmetrically decreased power bilateral upper and lower extremities, contractures+ Skin:Scattered excoriations Psychiatry:Not able to be assessed DATA REVIEW:   CBC  Recent Labs  Lab 04/01/21 0540  WBC 5.9  HGB 11.7*  HCT 38.1*  PLT 204    Chemistries  Recent Labs  Lab 04/01/21 0540 04/03/21 0623  NA 150* 143  K 3.5 3.3*  CL 116* 112*  CO2 25 24  GLUCOSE 122* 106*  BUN 31* 14  CREATININE 1.00 0.96  CALCIUM 9.0 8.5*  AST 27  --   ALT 24  --   ALKPHOS 47  --   BILITOT 1.0  --     Microbiology Results   Recent Results (from the past 240 hour(s))  CULTURE, URINE COMPREHENSIVE     Status: Abnormal   Collection Time: 03/31/21  1:55 PM   Specimen: Urine   UR  Result Value Ref Range Status   Urine Culture, Comprehensive Final report (A)  Final   Organism ID, Bacteria Proteus mirabilis (A)  Final    Comment: Cefazolin with an MIC <=16 predicts susceptibility to the oral agents cefaclor, cefdinir, cefpodoxime, cefprozil, cefuroxime, cephalexin, and loracarbef when used for therapy of uncomplicated urinary tract infections due to E. coli, Klebsiella pneumoniae, and Proteus mirabilis. Greater than 100,000 colony forming units per mL    ANTIMICROBIAL SUSCEPTIBILITY Comment  Final    Comment:       ** S =  Susceptible; I = Intermediate; R = Resistant **                    P = Positive; N = Negative             MICS are expressed in micrograms per mL    Antibiotic                 RSLT#1    RSLT#2    RSLT#3    RSLT#4 Amoxicillin/Clavulanic Acid    S Ampicillin                     R Cefazolin                      S Cefepime                       S Ceftriaxone                    S Cefuroxime                     S Ciprofloxacin                  I Ertapenem                      S Gentamicin                     S Levofloxacin                   S Meropenem                      S Nitrofurantoin                 R Piperacillin/Tazobactam        S Tetracycline                   R Tobramycin                     S Trimethoprim/Sulfa             R   Microscopic Examination     Status: Abnormal   Collection Time: 03/31/21  1:55 PM   Urine  Result Value Ref Range Status   WBC, UA >30 (A) 0 - 5 /hpf Final   RBC 11-30 (A) 0 - 2 /hpf Final   Epithelial Cells (non renal) 0-10 0 - 10 /hpf Final   Crystals Present (A) N/A Final   Crystal Type Triple Phosphate N/A Final   Bacteria, UA Many (A) None seen/Few Final  Blood culture (routine x 2)     Status: Abnormal   Collection Time: 03/31/21  2:38 PM   Specimen: BLOOD  Result Value Ref Range Status   Specimen Description   Final    BLOOD  BLOOD RIGHT FOREARM Performed at [facility], [address] [place], [place] [address]    Special Requests   Final    BOTTLES DRAWN AEROBIC AND ANAEROBIC Blood Culture adequate volume Performed at [facility], [address] [place], [place] [address]    Culture  Setup Time   Final    Organism ID to follow GRAM NEGATIVE RODS IN BOTH AEROBIC AND ANAEROBIC BOTTLES CRITICAL RESULT CALLED TO, READ BACK BY AND VERIFIED WITHLloyd Huger AT 3154 04/01/21 [place] Performed at [facility], [address] [place],  [address]    Culture PROTEUS MIRABILIS (A)  Final   Report Status  04/03/2021 FINAL  Final   Organism ID, Bacteria PROTEUS MIRABILIS  Final      Susceptibility   Proteus mirabilis - MIC*    AMPICILLIN >=32 RESISTANT Resistant     CEFAZOLIN 8 SENSITIVE Sensitive     CEFEPIME <=0.12 SENSITIVE Sensitive     CEFTAZIDIME <=1 SENSITIVE Sensitive     CEFTRIAXONE <=0.25 SENSITIVE Sensitive     CIPROFLOXACIN 2 INTERMEDIATE Intermediate     GENTAMICIN <=1 SENSITIVE Sensitive     IMIPENEM 2 SENSITIVE Sensitive     TRIMETH/SULFA >=320 RESISTANT Resistant     AMPICILLIN/SULBACTAM 4 SENSITIVE Sensitive     PIP/TAZO <=4 SENSITIVE Sensitive     * PROTEUS MIRABILIS  Blood Culture ID Panel (Reflexed)     Status: Abnormal   Collection Time: 03/31/21  2:38 PM  Result Value Ref Range Status   Enterococcus faecalis NOT DETECTED NOT DETECTED Final   Enterococcus Faecium NOT DETECTED NOT DETECTED Final   Listeria monocytogenes NOT DETECTED NOT DETECTED Final   Staphylococcus species NOT DETECTED NOT DETECTED Final   Staphylococcus aureus (BCID) NOT DETECTED NOT DETECTED Final   Staphylococcus epidermidis NOT DETECTED NOT DETECTED Final   Staphylococcus lugdunensis NOT DETECTED NOT DETECTED Final   Streptococcus species NOT DETECTED NOT DETECTED Final   Streptococcus agalactiae NOT DETECTED NOT DETECTED Final   Streptococcus pneumoniae NOT DETECTED NOT DETECTED Final   Streptococcus pyogenes NOT DETECTED NOT DETECTED Final   A.calcoaceticus-baumannii NOT DETECTED NOT DETECTED Final   Bacteroides fragilis NOT DETECTED NOT DETECTED Final   Enterobacterales DETECTED (A) NOT DETECTED Final    Comment: Enterobacterales represent a large order of gram negative bacteria, not a single organism. CRITICAL RESULT CALLED TO, READ BACK BY AND VERIFIED WITH:  NATHAN BELUE AT 6195 04/01/21 [place]    Enterobacter cloacae complex NOT DETECTED NOT DETECTED Final   Escherichia coli NOT DETECTED NOT DETECTED Final   Klebsiella aerogenes NOT DETECTED NOT DETECTED Final   Klebsiella oxytoca  NOT DETECTED NOT DETECTED Final   Klebsiella pneumoniae NOT DETECTED NOT DETECTED Final   Proteus species DETECTED (A) NOT DETECTED Final    Comment: CRITICAL RESULT CALLED TO, READ BACK BY AND VERIFIED WITH:  NATHAN BELUE AT 0932 04/01/21 SDR    Salmonella species NOT DETECTED NOT DETECTED Final   Serratia marcescens NOT DETECTED NOT DETECTED Final   Haemophilus influenzae NOT DETECTED NOT DETECTED Final   Neisseria meningitidis NOT DETECTED NOT DETECTED Final   Pseudomonas aeruginosa NOT DETECTED NOT DETECTED Final   Stenotrophomonas maltophilia NOT DETECTED NOT DETECTED Final   Candida albicans NOT DETECTED NOT DETECTED Final   Candida auris NOT DETECTED NOT DETECTED Final   Candida glabrata NOT DETECTED NOT DETECTED Final   Candida krusei NOT DETECTED NOT DETECTED Final   Candida parapsilosis NOT DETECTED NOT DETECTED Final   Candida  tropicalis NOT DETECTED NOT DETECTED Final   Cryptococcus neoformans/gattii NOT DETECTED NOT DETECTED Final   CTX-M ESBL NOT DETECTED NOT DETECTED Final   Carbapenem resistance IMP NOT DETECTED NOT DETECTED Final   Carbapenem resistance KPC NOT DETECTED NOT DETECTED Final   Carbapenem resistance NDM NOT DETECTED NOT DETECTED Final   Carbapenem resist OXA 48 LIKE NOT DETECTED NOT DETECTED Final   Carbapenem resistance VIM NOT DETECTED NOT DETECTED Final    Comment: Performed at [facility], [address] [place], [place] [address]  Blood culture (routine x 2)     Status: Abnormal   Collection Time: 03/31/21  2:47 PM   Specimen: BLOOD  Result Value Ref Range Status   Specimen Description   Final    BLOOD RIGHT ANTECUBITAL Performed at [facility], [address], [place], [place] [address]    Special Requests   Final    BOTTLES DRAWN AEROBIC AND ANAEROBIC Blood Culture adequate volume Performed at [facility], [address] [place], [place] [address]    Culture  Setup Time   Final    GRAM NEGATIVE RODS IN BOTH  AEROBIC AND ANAEROBIC BOTTLES CRITICAL VALUE NOTED.  VALUE IS CONSISTENT WITH PREVIOUSLY REPORTED AND CALLED VALUE. Performed at [facility], [address] [place], [place] [address]    Culture (A)  Final    PROTEUS MIRABILIS SUSCEPTIBILITIES PERFORMED ON PREVIOUS CULTURE WITHIN THE LAST 5 DAYS. Performed at [facility], [place] [address], [place], [place] [address]    Report Status 04/03/2021 FINAL  Final  Urine culture     Status: Abnormal   Collection Time: 03/31/21  3:21 PM   Specimen: In/Out Cath Urine  Result Value Ref Range Status   Specimen Description   Final    IN/OUT CATH URINE Performed at [facility], [address] [place], [place] [address]    Special Requests   Final    NONE Performed at [facility], [place], [place] [address]    Culture >=100,000 COLONIES/mL PROTEUS MIRABILIS (A)  Final   Report Status 04/03/2021 FINAL  Final   Organism ID, Bacteria PROTEUS MIRABILIS (A)  Final      Susceptibility   Proteus mirabilis - MIC*    AMPICILLIN >=32 RESISTANT Resistant     CEFAZOLIN 8 SENSITIVE Sensitive     CEFEPIME <=0.12 SENSITIVE Sensitive     CEFTRIAXONE <=0.25 SENSITIVE Sensitive     CIPROFLOXACIN 2 INTERMEDIATE Intermediate     GENTAMICIN <=1 SENSITIVE Sensitive     IMIPENEM 2 SENSITIVE Sensitive     NITROFURANTOIN RESISTANT Resistant     TRIMETH/SULFA >=320 RESISTANT Resistant     AMPICILLIN/SULBACTAM 4 SENSITIVE Sensitive     PIP/TAZO <=4 SENSITIVE Sensitive     * >=100,000 COLONIES/mL PROTEUS MIRABILIS  Resp Panel by RT-PCR (Flu A&B, Covid) Nasopharyngeal Swab     Status: None   Collection Time: 03/31/21  3:23 PM   Specimen: Nasopharyngeal Swab; Nasopharyngeal(NP) swabs in vial transport medium  Result Value Ref Range Status   SARS Coronavirus 2 by RT PCR NEGATIVE NEGATIVE Final    Comment: (NOTE) SARS-CoV-2 target nucleic acids are NOT DETECTED.  The SARS-CoV-2 RNA is generally detectable in  upper respiratory specimens during the acute phase of infection. The lowest concentration of SARS-CoV-2 viral copies this assay can detect is 138 copies/mL. A negative result does not preclude SARS-Cov-2 infection and should not be used as the sole basis for treatment  or other patient management decisions. A negative result may occur with  improper specimen collection/handling, submission of specimen other than nasopharyngeal swab, presence of viral mutation(s) within the areas targeted by this assay, and inadequate number of viral copies(<138 copies/mL). A negative result must be combined with clinical observations, patient history, and epidemiological information. The expected result is Negative.  Fact Sheet for Patients:  EntrepreneurPulse.com.au  Fact Sheet for Healthcare Providers:  IncredibleEmployment.be  This test is no t yet approved or cleared by the [place] FDA and  has been authorized for detection and/or diagnosis of SARS-CoV-2 by FDA under an Emergency Use Authorization (EUA). This EUA will remain  in effect (meaning this test can be used) for the duration of the COVID-19 declaration under Section 564(b)(1) of the Act, 21 U.S.C.section 360bbb-3(b)(1), unless the authorization is terminated  or revoked sooner.       Influenza A by PCR NEGATIVE NEGATIVE Final   Influenza B by PCR NEGATIVE NEGATIVE Final    Comment: (NOTE) The Xpert Xpress SARS-CoV-2/FLU/RSV plus assay is intended as an aid in the diagnosis of influenza from Nasopharyngeal swab specimens and should not be used as a sole basis for treatment. Nasal washings and aspirates are unacceptable for Xpert Xpress SARS-CoV-2/FLU/RSV testing.  Fact Sheet for Patients: EntrepreneurPulse.com.au  Fact Sheet for Healthcare Providers: IncredibleEmployment.be  This test is not yet approved or cleared by the [place] FDA and has been  authorized for detection and/or diagnosis of SARS-CoV-2 by FDA under an Emergency Use Authorization (EUA). This EUA will remain in effect (meaning this test can be used) for the duration of the COVID-19 declaration under Section 564(b)(1) of the Act, 21 U.S.C. section 360bbb-3(b)(1), unless the authorization is terminated or revoked.  Performed at [facility], [address] [place], [place] [address]   Culture, blood (single) w Reflex to ID Panel     Status: None (Preliminary result)   Collection Time: 04/03/21 10:34 AM   Specimen: BLOOD  Result Value Ref Range Status   Specimen Description BLOOD LEFT ARM  Final   Special Requests   Final    BOTTLES DRAWN AEROBIC AND ANAEROBIC Blood Culture adequate volume   Culture   Final    NO GROWTH 3 DAYS Performed at [facility], [address], [place], [place] [address]    Report Status PENDING  Incomplete    RADIOLOGY:  No results found.   CODE STATUS:     Code Status Orders  (From admission, onward)         Start     Ordered   04/01/21 1007  Do not attempt resuscitation (DNR)  Continuous       Question Answer Comment  In the event of cardiac or respiratory ARREST Do not call a "code blue"   In the event of cardiac or respiratory ARREST Do not perform Intubation, CPR, defibrillation or ACLS   In the event of cardiac or respiratory ARREST Use medication by any route, position, wound care, and other measures to relive pain and suffering. May use oxygen, suction and manual treatment of airway obstruction as needed for comfort.      04/01/21 1006        Code Status History    Date Active Date Inactive Code Status Order ID Comments User Context   03/31/2021 1656 04/01/2021 1006 Full Code [id number]  Sidney Ace, MD ED   08/03/2020 2052 08/07/2020 1914 Full Code [id number]  Toy Baker, MD ED   01/11/2020  1520 01/29/2020 1735 DNR [id number]  Fritzi Mandes, MD Inpatient   01/10/2020 1628 01/11/2020 1520  Full Code [id number]  Fritzi Mandes, MD Inpatient   01/10/2020 1038 01/10/2020 1628 DNR [id number]  Fritzi Mandes, MD Inpatient   01/08/2020 1649 01/10/2020 1038 Full Code [id number]  Ivor Costa, MD Inpatient   01/28/2019 0027 01/31/2019 2231 Full Code [id number]  [place], Lanice Schwab, MD ED   Advance Care Planning Activity       TOTAL TIME TAKING CARE OF THIS PATIENT: 35 minutes.    Fritzi Mandes M.D  [facility]    CC: Primary care physician; Bonnita Nasuti, MD

## 2021-04-06 NOTE — TOC Progression Note (Signed)
Transition of Care [facility]) - Progression Note    Patient Details  Name: Samuel Shelton MRN: [id number] Date of Birth: 1951/10/24  Transition of Care [facility]) CM/SW [place], RN Phone Number: 04/06/2021, 1:40 PM  Clinical Narrative:   Spoke to the niece Jeannetta Nap, she stated that it is fine for him to go back to his regular facility     Barriers to Discharge: Continued Medical Work up  Expected Discharge Plan and Services           Expected Discharge Date: 04/06/21                                     Social Determinants of Health (SDOH) Interventions    Readmission Risk Interventions No flowsheet data found.

## 2021-04-06 NOTE — TOC Progression Note (Addendum)
Transition of Care [facility]) - Progression Note    Patient Details  Name: Samuel Shelton MRN: [id number] Date of Birth: May 03, 1951  Transition of Care [facility]) CM/SW [place], RN Phone Number: 04/06/2021, 5:03 PM  Clinical Narrative:   [facility] showed up at the medical mall entrance and called the nurses desk to pick up the patient to transport, They came in a wheelchair van, no wheelchair and no stretcher.  I called [facility] and explained the patient will need to be picked up on a stretcher as requested, he is non ambulatory, They stated that They would come to the room and pick up the patient, I explained that we are not able to send the patient in a hospital stretcher. The Lucianne Lei that came to transport does not  Have a stretcher, [facility] asked me to allow the [facility] to temporarily use an [place] and then return it to our facility, I explained I can't allow that but will ask leadership and they are agreeing to loan [facility] a stretcher from the OR to transport the patient and they will return the stretcher to [place] today, The driver tried to put the stretcher in the [place] and it was too large. Christopher with [facility] called back and arranged for [facility] to pick up the patient today       Barriers to Discharge: Continued Medical Work up  Expected Discharge Plan and Services           Expected Discharge Date: 04/06/21                                     Social Determinants of Health (SDOH) Interventions    Readmission Risk Interventions No flowsheet data found.

## 2021-04-06 NOTE — TOC Progression Note (Signed)
Transition of Care [facility]) - Progression Note    Patient Details  Name: Samuel Shelton MRN: [id number] Date of Birth: Jul 11, 1951  Transition of Care [facility]) CM/SW [place], RN Phone Number: 04/06/2021, 1:55 PM  Clinical Narrative:   Novella Olive to transport, they are unable to transport due to Staffing, Called EMS to transport, they have 4 ahead of the patient and added him to the list      Barriers to Discharge: Continued Medical Work up  Expected Discharge Plan and Services           Expected Discharge Date: 04/06/21                                     Social Determinants of Health (SDOH) Interventions    Readmission Risk Interventions No flowsheet data found.

## 2021-04-06 NOTE — Discharge Instructions (Signed)
Diet Recommendation Dysphagia 1 (Puree);Thin liquid   Liquid Administration via: Straw Medication Administration: Crushed with puree Supervision: Full supervision/cueing for compensatory strategies;Staff to assist with self feeding Compensations: Minimize environmental distractions;Slow rate;Small sips/bites Postural Changes: Seated upright at 90 degrees;Remain upright for at least 30 minutes after po intake

## 2021-04-06 NOTE — Progress Notes (Signed)
[name], male   DOB: May 30, 1951, 70 y.o.   MRN: [id number]  Informed by [facility] patient will return to his original long-term care [facility] in [place].

## 2021-04-06 NOTE — TOC Progression Note (Signed)
Transition of Care [facility]) - Progression Note    Patient Details  Name: Samuel Shelton MRN: [id number] Date of Birth: 12/05/1951  Transition of Care [facility]) CM/SW [place], RN Phone Number: 04/06/2021, 2:22 PM  Clinical Narrative:    [facility] arranged [facility] to pick up the patient at 4 PM     Barriers to Discharge: Continued Medical Work up  Expected Discharge Plan and Services           Expected Discharge Date: 04/06/21                                     Social Determinants of Health (SDOH) Interventions    Readmission Risk Interventions No flowsheet data found.

## 2021-04-06 NOTE — TOC Progression Note (Signed)
Transition of Care [facility]) - Progression Note    Patient Details  Name: Samuel Shelton MRN: [id number] Date of Birth: December 07, 1951  Transition of Care [facility]) CM/SW [place], RN Phone Number: 04/06/2021, 1:43 PM  Clinical Narrative:   [place] to update on DC information and notify that the patient will be discharged back to the facility today, spoke with Broadus John, sent thru hub        Barriers to Discharge: Continued Medical Work up  Expected Discharge Plan and Services           Expected Discharge Date: 04/06/21                                     Social Determinants of Health (SDOH) Interventions    Readmission Risk Interventions No flowsheet data found.

## 2021-04-06 NOTE — Progress Notes (Signed)
VSS and IV removed. Pt was transported by [facility] via stretcher with his belongings  back to [place] the facility from which he came from.

## 2021-04-08 LAB — CULTURE, BLOOD (SINGLE)
Culture: NO GROWTH
Special Requests: ADEQUATE

## 2021-04-26 NOTE — Progress Notes (Deleted)
Suprapubic Cath Change  Patient is present today for a suprapubic catheter change due to urinary retention.  ***ml of water was drained from the balloon, a ***FR foley cath was removed from the tract with out difficulty.  Site was cleaned and prepped in a sterile fashion with betadine.  A ***FR foley cath was replaced into the tract {dnt complications:20057}. Urine return was noted, 10 ml of sterile water was inflated into the balloon and a *** bag was attached for drainage.  Patient tolerated well. A night bag was given to patient and proper instruction was given on how to switch bags.    Preformed by: ***  Follow up: ***  

## 2021-04-27 ENCOUNTER — Ambulatory Visit: Payer: Medicare Other | Admitting: Urology

## 2021-04-27 DIAGNOSIS — Z9359 Other cystostomy status: Secondary | ICD-10-CM

## 2021-05-04 NOTE — Progress Notes (Deleted)
Suprapubic Cath Change  Patient is present today for a suprapubic catheter change due to urinary retention.  ***ml of water was drained from the balloon, a ***FR foley cath was removed from the tract with out difficulty.  Site was cleaned and prepped in a sterile fashion with betadine.  A ***FR foley cath was replaced into the tract {dnt complications:20057}. Urine return was noted, 10 ml of sterile water was inflated into the balloon and a *** bag was attached for drainage.  Patient tolerated well. A night bag was given to patient and proper instruction was given on how to switch bags.    Preformed by: ***  Follow up: ***  

## 2021-05-05 ENCOUNTER — Ambulatory Visit: Payer: Medicare Other | Admitting: Urology

## 2021-05-05 DIAGNOSIS — Z9359 Other cystostomy status: Secondary | ICD-10-CM

## 2021-05-10 NOTE — Progress Notes (Signed)
Suprapubic Cath Change  Patient is present today for a suprapubic catheter change due to urinary retention.  9 ml of water was drained from the balloon, a 16 Silicone coude FR foley cath was removed from the tract with out difficulty.  Site was cleaned and prepped in a sterile fashion with betadine.  A 18 FR foley cath was replaced into the tract no complications were noted. Urine return was noted, 10 ml of sterile water was inflated into the balloon and a night bag was attached for drainage.  Patient tolerated well.   Performed by: Nori Riis, PA-C, Kyra Manges, CMA and Shanon Ace, CMA  Follow up: One month for SPT exchange.  SPT site appears to have some evidence of erosion.  His SPT was taunt and his tubing for his overnight bag was twisted around both legs when he presented to the office.  I left directions in his paperwork for the facility to ensure that the SPTcatheter is left on slack and also to run the tubing down to 1 leg only and to monitor for a tangled tube.

## 2021-05-11 ENCOUNTER — Ambulatory Visit (INDEPENDENT_AMBULATORY_CARE_PROVIDER_SITE_OTHER): Payer: Medicare Other | Admitting: Urology

## 2021-05-11 ENCOUNTER — Other Ambulatory Visit: Payer: Self-pay

## 2021-05-11 DIAGNOSIS — Z9359 Other cystostomy status: Secondary | ICD-10-CM

## 2021-06-07 ENCOUNTER — Telehealth: Payer: Self-pay | Admitting: Urology

## 2021-06-07 NOTE — Telephone Encounter (Signed)
Gretchen from the home called back and I spoke with her and then was transferred to the patient's nurse to clarify patient's status. Patient's nurse states they believe the catheter is "dislodged" because the stitch in his skin holding the catheter in place is out an inch further than normal and patient has had almost no urine drainage this morning along with a large spasm incontinence episode as well. She states last night patient had 313ml of dark milky urine was emptied but since then no real drainage into the bag. She states that the doctor at the home just checked in with the patient and he is sending him to the hospital for possible sepsis. Patient is very weak and lethargic and does not look well along with dark milky urine, no fever noted but with his history they would like him evaluated at the ED.  Will forward this message to Zara Council, [facility] for notification and update.

## 2021-06-07 NOTE — Telephone Encounter (Signed)
Someone from [facility] called today [phone], ext [phone]) and left a voice mail message to report that patients SPT has "dislodged".    Please contact them to discuss.

## 2021-06-08 NOTE — Progress Notes (Deleted)
Suprapubic Cath Change  Patient is present today for a suprapubic catheter change due to urinary retention.  ***ml of water was drained from the balloon, a ***FR foley cath was removed from the tract with out difficulty.  Site was cleaned and prepped in a sterile fashion with betadine.  A ***FR foley cath was replaced into the tract {dnt complications:20057}. Urine return was noted, 10 ml of sterile water was inflated into the balloon and a *** bag was attached for drainage.  Patient tolerated well. A night bag was given to patient and proper instruction was given on how to switch bags.    Preformed by: ***  Follow up: ***

## 2021-06-09 ENCOUNTER — Ambulatory Visit: Payer: Self-pay | Admitting: Urology

## 2021-06-09 NOTE — Telephone Encounter (Signed)
[place] to check on patient's status, spoke w/Keisha patient's nurse. She states patient was admitted to [facility] w/UTI and Dehydration. She will call back when he is discharged to reschedule his SPT exchange.

## 2021-07-26 DEATH — deceased

## 2021-11-19 IMAGING — XA IR IMAGE GUIDED DRAINAGE BY PERCUTANEOUS CATHETER
5 series · 8 of 8 positions shown · non-contrast
Comparison: None.

INDICATION: 70-year-old male with a history suprapubic catheter. This has been
accidentally displaced and he presents for a rescue/new catheter

EXAM:
IMAGE GUIDED PLACEMENT OF NEW SUPRAPUBIC CATHETER

[Series 1: fl (-) angio · 1 of 1 slices shown (1 of 3)]
[im 1/1]
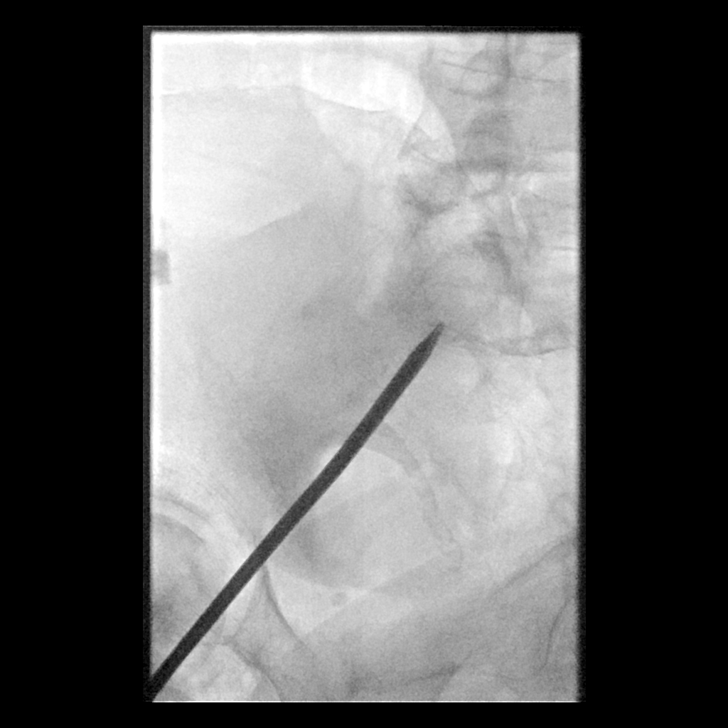

[Series 2: single · 2 of 2 slices shown (1 of 2)]
[im 1/2]
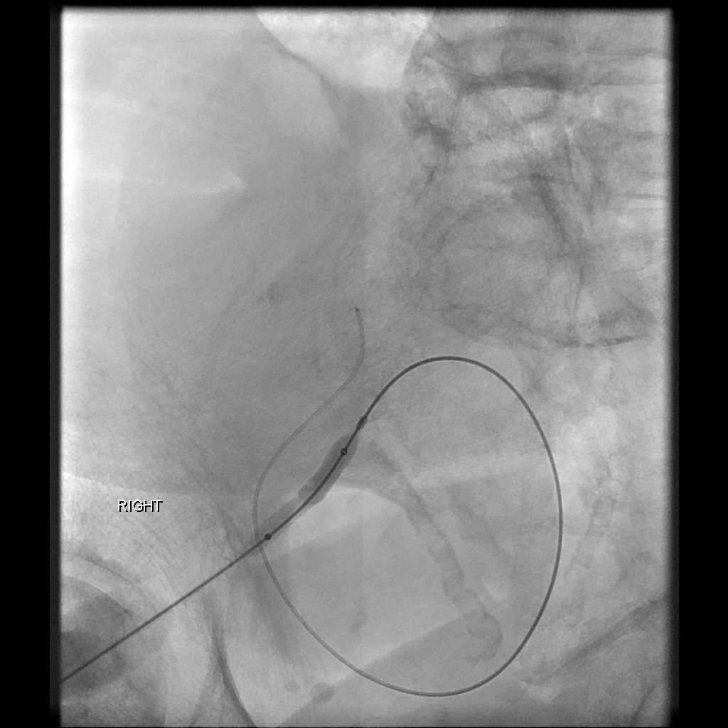
[im 2/2]
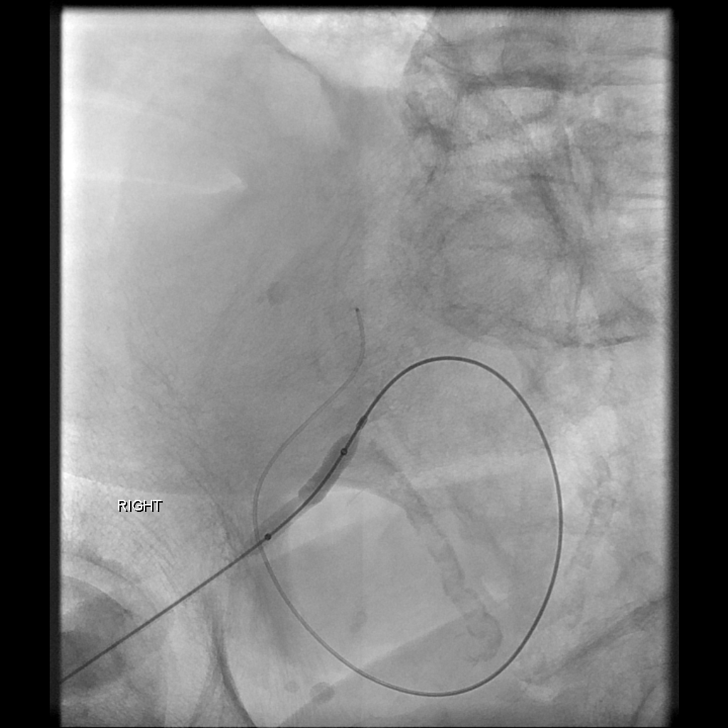

[Series 3: single · 2 of 2 slices shown (2 of 2)]
[im 1/2]
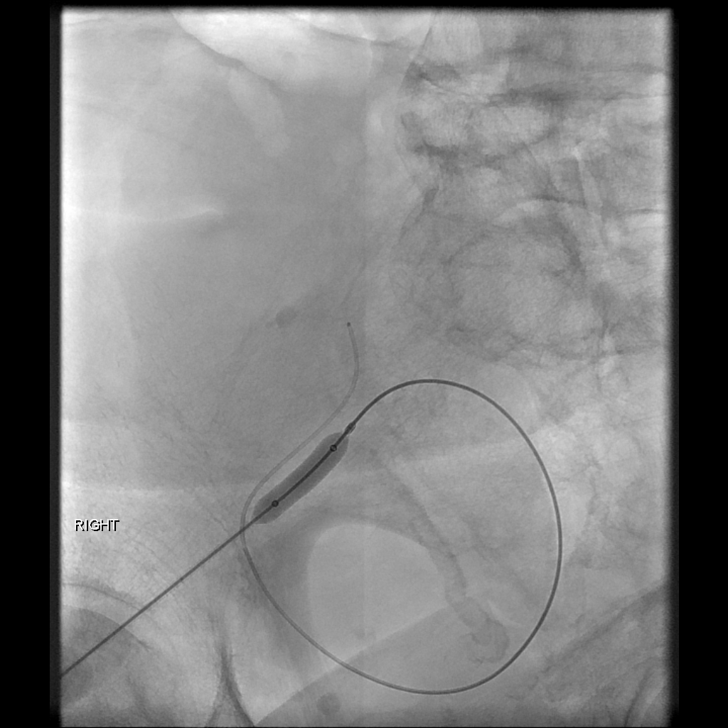
[im 2/2]
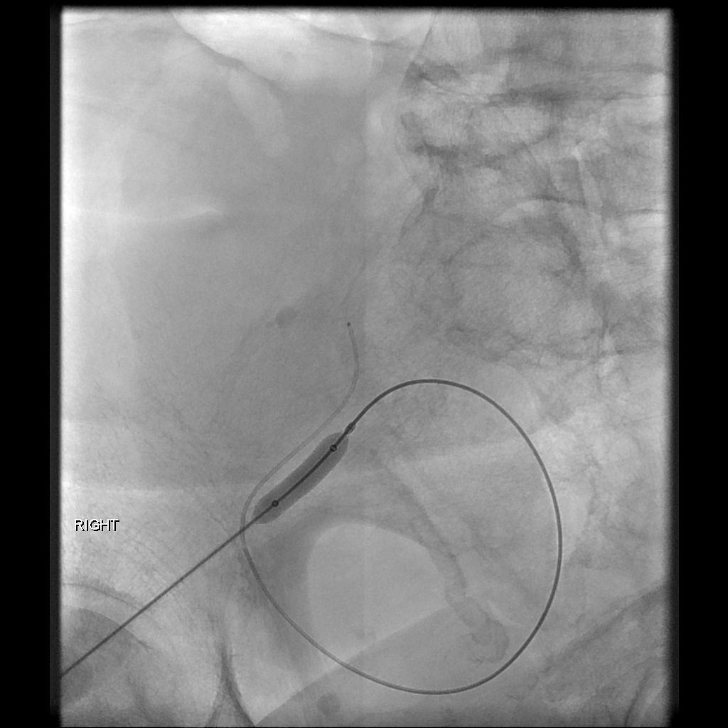

[Series 4: fl (-) angio · 1 of 1 slices shown (2 of 3)]
[im 1/1]
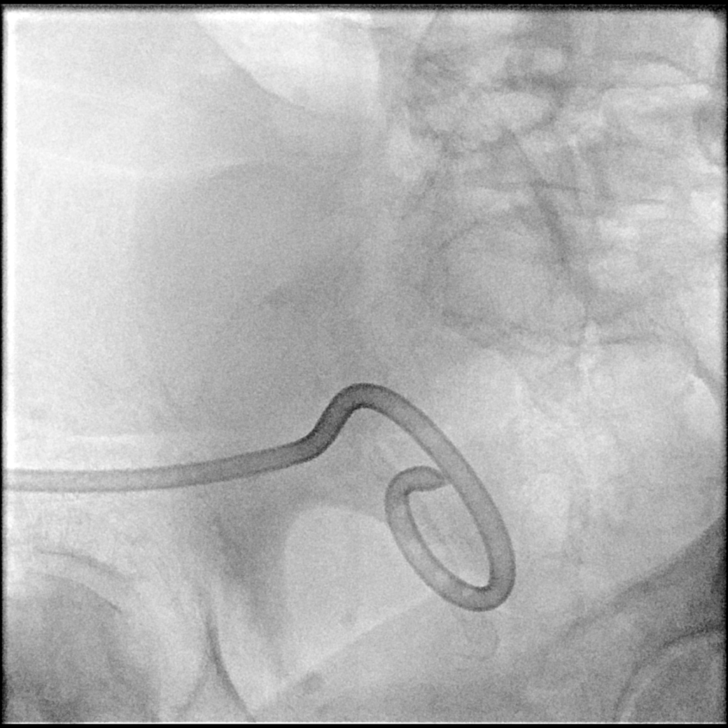

[Series 5: fl (-) angio · 2 of 2 slices shown (3 of 3)]
[im 1/2]
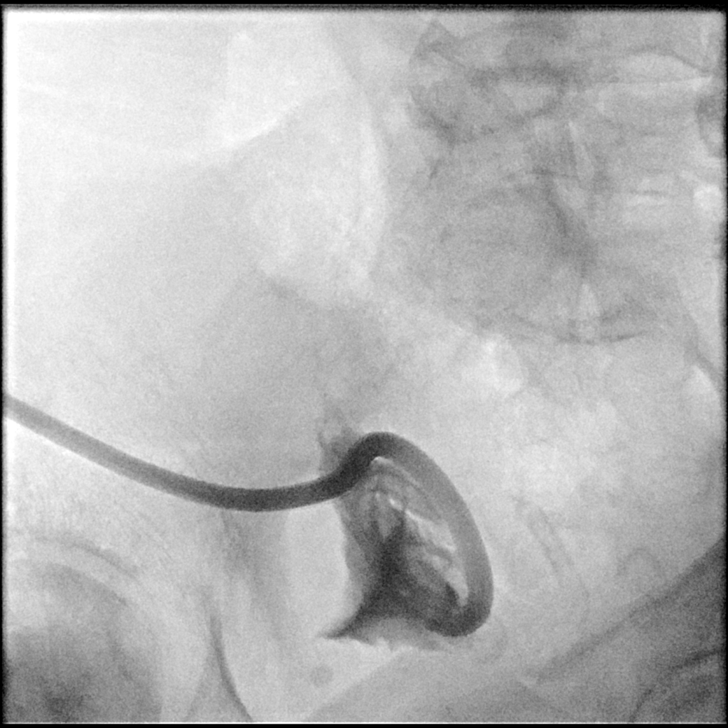
[im 2/2]
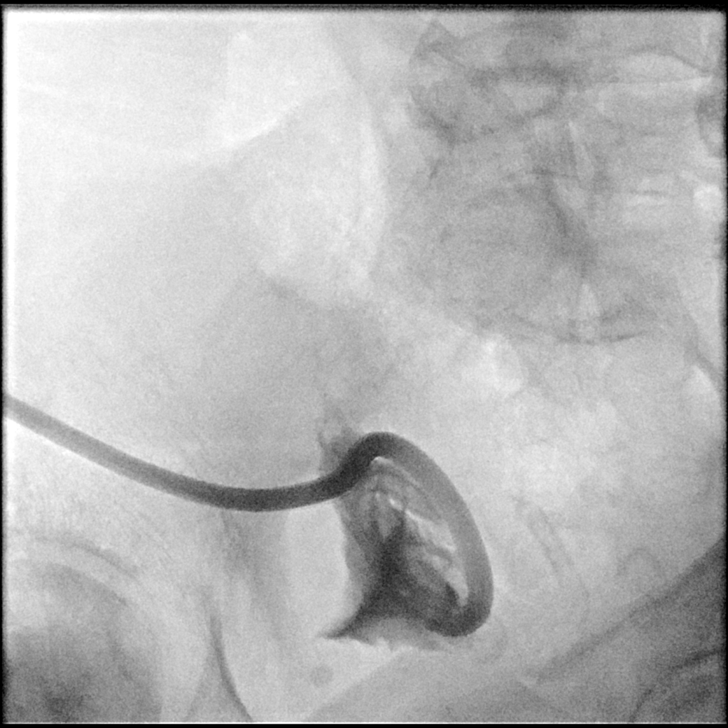

[8 of 8 positions shown; findings below may reference images not displayed]

MEDICATIONS:
Ciprofloxacin 400 mg IV; The antibiotic was administered in an
appropriate time frame prior to skin puncture.

ANESTHESIA/SEDATION:
The anesthesia team was present to provide general endotracheal tube
anesthesia and for patient monitoring during the procedure.
Intubation was performed in IR. Interventional radiology nursing
staff was also present.

CONTRAST:  10 cc-administered into the collecting system(s)

FLUOROSCOPY TIME:  Fluoroscopy Time:  minutes  seconds ( mGy).

COMPLICATIONS:
None immediate.

PROCEDURE:
Informed written consent was obtained from the patient after a
thorough discussion of the procedural risks, benefits and
alternatives. All questions were addressed. Maximal Sterile Barrier
Technique was utilized including caps, mask, sterile gowns, sterile
gloves, sterile drape, hand hygiene and skin antiseptic. A timeout
was performed prior to the initiation of the procedure. The patient
was intubated by the anesthesia team for PETERSEN.

Once the patient is prepped and draped in the usual sterile fashion,
a Kumpe the catheter with some contrast was used to probe the ostomy
site which was not draining urine. We were able to navigate a Kumpe
the catheter into the urinary bladder is under fluoroscopic
observation. Once the catheter was in the urinary bladder, short
Amplatz was placed.

Dilation was then initiated with a 12 French dilator and then a 14
French dilator.

A similar location with dense fibrotic scarring was encountered
during the dilation, which it seems to be at the anterior aspect of
the urinary bladder. This was encountered on the last exchange. With
the 16 French dilator in place, the wire was removed and a longer
Amplatz wire was placed. 6 mm balloon angioplasty was then performed
at the site of the fibrotic scarring at the anterior urinary
bladder.

We then placed a 16 French pigtail drainage catheter which easily
advanced over the wire through through the tract into the urinary
bladder. The catheter was formed and the wire was removed.

Approximately 500 cc of foul-smelling cloudy debris-filled urine was
then aspirated from the urinary bladder. Small contrast was injected
confirming are location within the decompressed urinary bladder.

Catheter was sutured in place and attached to gravity leg bag.

Patient tolerated the procedure well and remained hemodynamically
stable throughout.

No complications were encountered and no significant blood loss.
IMPRESSION: Status post rescue and placement of a new suprapubic 16 French
pigtail drainage catheter, as above
